# Patient Record
Sex: Female | Born: 1940 | Race: White | Hispanic: No | Smoking: Former smoker
Health system: Southern US, Community
[De-identification: ages and names within clinical notes are randomized; demographics above are authoritative.]

## PROBLEM LIST (undated history)

## (undated) DIAGNOSIS — H5789 Other specified disorders of eye and adnexa: Secondary | ICD-10-CM

## (undated) DIAGNOSIS — I5022 Chronic systolic (congestive) heart failure: Secondary | ICD-10-CM

## (undated) DIAGNOSIS — Z952 Presence of prosthetic heart valve: Secondary | ICD-10-CM

## (undated) DIAGNOSIS — C439 Malignant melanoma of skin, unspecified: Secondary | ICD-10-CM

## (undated) DIAGNOSIS — I4821 Permanent atrial fibrillation: Secondary | ICD-10-CM

## (undated) DIAGNOSIS — I809 Phlebitis and thrombophlebitis of unspecified site: Secondary | ICD-10-CM

## (undated) DIAGNOSIS — H409 Unspecified glaucoma: Secondary | ICD-10-CM

## (undated) DIAGNOSIS — I10 Essential (primary) hypertension: Secondary | ICD-10-CM

## (undated) DIAGNOSIS — I428 Other cardiomyopathies: Secondary | ICD-10-CM

## (undated) DIAGNOSIS — A048 Other specified bacterial intestinal infections: Secondary | ICD-10-CM

## (undated) DIAGNOSIS — E785 Hyperlipidemia, unspecified: Secondary | ICD-10-CM

## (undated) DIAGNOSIS — N183 Chronic kidney disease, stage 3 unspecified: Secondary | ICD-10-CM

## (undated) DIAGNOSIS — I2510 Atherosclerotic heart disease of native coronary artery without angina pectoris: Secondary | ICD-10-CM

## (undated) DIAGNOSIS — I639 Cerebral infarction, unspecified: Secondary | ICD-10-CM

## (undated) DIAGNOSIS — I742 Embolism and thrombosis of arteries of the upper extremities: Secondary | ICD-10-CM

## (undated) DIAGNOSIS — I071 Rheumatic tricuspid insufficiency: Secondary | ICD-10-CM

## (undated) DIAGNOSIS — Z8679 Personal history of other diseases of the circulatory system: Secondary | ICD-10-CM

## (undated) HISTORY — DX: Atherosclerotic heart disease of native coronary artery without angina pectoris: I25.10

## (undated) HISTORY — PX: CATARACT EXTRACTION: SUR2

## (undated) HISTORY — DX: Embolism and thrombosis of arteries of the upper extremities: I74.2

## (undated) HISTORY — PX: OTHER SURGICAL HISTORY: SHX169

## (undated) HISTORY — DX: Presence of prosthetic heart valve: Z95.2

## (undated) HISTORY — DX: Permanent atrial fibrillation: I48.21

## (undated) HISTORY — DX: Hyperlipidemia, unspecified: E78.5

## (undated) HISTORY — DX: Rheumatic tricuspid insufficiency: I07.1

## (undated) HISTORY — DX: Chronic kidney disease, stage 3 unspecified: N18.30

## (undated) HISTORY — DX: Other specified disorders of eye and adnexa: H57.89

## (undated) HISTORY — DX: Essential (primary) hypertension: I10

## (undated) HISTORY — PX: ABDOMINAL HYSTERECTOMY: SHX81

## (undated) HISTORY — DX: Other cardiomyopathies: I42.8

## (undated) HISTORY — DX: Unspecified glaucoma: H40.9

## (undated) HISTORY — PX: CHOLECYSTECTOMY: SHX55

## (undated) HISTORY — DX: Other specified bacterial intestinal infections: A04.8

## (undated) HISTORY — DX: Personal history of other diseases of the circulatory system: Z86.79

## (undated) HISTORY — DX: Chronic kidney disease, stage 3 (moderate): N18.3

## (undated) HISTORY — DX: Chronic systolic (congestive) heart failure: I50.22

## (undated) HISTORY — DX: Malignant melanoma of skin, unspecified: C43.9

## (undated) HISTORY — DX: Cerebral infarction, unspecified: I63.9

## (undated) HISTORY — PX: BREAST BIOPSY: SHX20

## (undated) HISTORY — DX: Phlebitis and thrombophlebitis of unspecified site: I80.9

---

## 1989-06-23 HISTORY — PX: TOTAL ABDOMINAL HYSTERECTOMY W/ BILATERAL SALPINGOOPHORECTOMY: SHX83

## 2000-03-09 DIAGNOSIS — I2510 Atherosclerotic heart disease of native coronary artery without angina pectoris: Secondary | ICD-10-CM

## 2000-03-09 HISTORY — DX: Atherosclerotic heart disease of native coronary artery without angina pectoris: I25.10

## 2004-06-23 DIAGNOSIS — C439 Malignant melanoma of skin, unspecified: Secondary | ICD-10-CM

## 2004-06-23 HISTORY — DX: Malignant melanoma of skin, unspecified: C43.9

## 2004-06-23 HISTORY — PX: GALLBLADDER SURGERY: SHX652

## 2011-06-24 HISTORY — PX: ABLATION: SHX5711

## 2012-05-23 DIAGNOSIS — Z952 Presence of prosthetic heart valve: Secondary | ICD-10-CM

## 2012-05-23 HISTORY — PX: TRICUSPID VALVE REPLACEMENT: SHX816

## 2012-05-23 HISTORY — DX: Presence of prosthetic heart valve: Z95.2

## 2012-05-23 HISTORY — PX: MITRAL VALVE REPLACEMENT: SHX147

## 2012-06-23 DIAGNOSIS — I742 Embolism and thrombosis of arteries of the upper extremities: Secondary | ICD-10-CM | POA: Insufficient documentation

## 2012-06-23 DIAGNOSIS — I639 Cerebral infarction, unspecified: Secondary | ICD-10-CM

## 2012-06-23 HISTORY — DX: Embolism and thrombosis of arteries of the upper extremities: I74.2

## 2012-06-23 HISTORY — DX: Cerebral infarction, unspecified: I63.9

## 2012-06-23 HISTORY — PX: ATHERECTOMY: SHX47

## 2013-02-21 HISTORY — PX: DG ABDOMEN COMPLETE (ARMC HX): HXRAD1017

## 2013-07-01 DIAGNOSIS — I509 Heart failure, unspecified: Secondary | ICD-10-CM | POA: Diagnosis not present

## 2013-07-01 DIAGNOSIS — I4891 Unspecified atrial fibrillation: Secondary | ICD-10-CM | POA: Diagnosis not present

## 2013-07-01 DIAGNOSIS — I059 Rheumatic mitral valve disease, unspecified: Secondary | ICD-10-CM | POA: Diagnosis not present

## 2013-07-04 DIAGNOSIS — Z961 Presence of intraocular lens: Secondary | ICD-10-CM | POA: Diagnosis not present

## 2013-07-04 DIAGNOSIS — H401290 Low-tension glaucoma, unspecified eye, stage unspecified: Secondary | ICD-10-CM | POA: Diagnosis not present

## 2013-07-07 DIAGNOSIS — I509 Heart failure, unspecified: Secondary | ICD-10-CM | POA: Diagnosis not present

## 2013-07-13 DIAGNOSIS — I4891 Unspecified atrial fibrillation: Secondary | ICD-10-CM | POA: Diagnosis not present

## 2013-07-13 DIAGNOSIS — I059 Rheumatic mitral valve disease, unspecified: Secondary | ICD-10-CM | POA: Diagnosis not present

## 2013-07-13 DIAGNOSIS — I509 Heart failure, unspecified: Secondary | ICD-10-CM | POA: Diagnosis not present

## 2013-07-15 DIAGNOSIS — L03039 Cellulitis of unspecified toe: Secondary | ICD-10-CM | POA: Diagnosis not present

## 2013-07-18 DIAGNOSIS — I4891 Unspecified atrial fibrillation: Secondary | ICD-10-CM | POA: Diagnosis not present

## 2013-07-18 DIAGNOSIS — Z7901 Long term (current) use of anticoagulants: Secondary | ICD-10-CM | POA: Diagnosis not present

## 2013-08-02 DIAGNOSIS — I509 Heart failure, unspecified: Secondary | ICD-10-CM | POA: Diagnosis not present

## 2013-08-05 DIAGNOSIS — I509 Heart failure, unspecified: Secondary | ICD-10-CM | POA: Diagnosis not present

## 2013-08-05 DIAGNOSIS — I059 Rheumatic mitral valve disease, unspecified: Secondary | ICD-10-CM | POA: Diagnosis not present

## 2013-08-05 DIAGNOSIS — I4891 Unspecified atrial fibrillation: Secondary | ICD-10-CM | POA: Diagnosis not present

## 2013-08-11 DIAGNOSIS — Z1212 Encounter for screening for malignant neoplasm of rectum: Secondary | ICD-10-CM | POA: Diagnosis not present

## 2013-08-11 DIAGNOSIS — Z01419 Encounter for gynecological examination (general) (routine) without abnormal findings: Secondary | ICD-10-CM | POA: Diagnosis not present

## 2013-08-11 DIAGNOSIS — Z1389 Encounter for screening for other disorder: Secondary | ICD-10-CM | POA: Diagnosis not present

## 2013-08-11 DIAGNOSIS — Z1272 Encounter for screening for malignant neoplasm of vagina: Secondary | ICD-10-CM | POA: Diagnosis not present

## 2013-08-21 HISTORY — PX: INSERT / REPLACE / REMOVE PACEMAKER: SUR710

## 2013-08-23 DIAGNOSIS — I442 Atrioventricular block, complete: Secondary | ICD-10-CM | POA: Diagnosis not present

## 2013-08-23 DIAGNOSIS — I4891 Unspecified atrial fibrillation: Secondary | ICD-10-CM | POA: Diagnosis not present

## 2013-08-23 DIAGNOSIS — I509 Heart failure, unspecified: Secondary | ICD-10-CM | POA: Diagnosis not present

## 2013-08-23 DIAGNOSIS — R609 Edema, unspecified: Secondary | ICD-10-CM | POA: Diagnosis not present

## 2013-08-23 DIAGNOSIS — I129 Hypertensive chronic kidney disease with stage 1 through stage 4 chronic kidney disease, or unspecified chronic kidney disease: Secondary | ICD-10-CM | POA: Diagnosis not present

## 2013-08-23 DIAGNOSIS — N183 Chronic kidney disease, stage 3 unspecified: Secondary | ICD-10-CM | POA: Diagnosis not present

## 2013-08-23 DIAGNOSIS — D631 Anemia in chronic kidney disease: Secondary | ICD-10-CM | POA: Diagnosis not present

## 2013-08-23 LAB — VITAMIN B12: VITAMIN B12: 491

## 2013-08-23 LAB — IRON AND TIBC: TIBC: 404

## 2013-08-23 LAB — PTH, INTACT: PTH Interp: 54.6

## 2013-08-23 LAB — FOLATE: FOLATE: 12.5

## 2013-08-23 LAB — FERRITIN: FERRITIN: 120

## 2013-08-29 DIAGNOSIS — Z7901 Long term (current) use of anticoagulants: Secondary | ICD-10-CM | POA: Diagnosis not present

## 2013-08-29 DIAGNOSIS — I4891 Unspecified atrial fibrillation: Secondary | ICD-10-CM | POA: Diagnosis not present

## 2013-08-30 DIAGNOSIS — I871 Compression of vein: Secondary | ICD-10-CM | POA: Diagnosis not present

## 2013-09-05 DIAGNOSIS — I447 Left bundle-branch block, unspecified: Secondary | ICD-10-CM | POA: Diagnosis not present

## 2013-09-05 DIAGNOSIS — Z86718 Personal history of other venous thrombosis and embolism: Secondary | ICD-10-CM | POA: Diagnosis not present

## 2013-09-05 DIAGNOSIS — Z7901 Long term (current) use of anticoagulants: Secondary | ICD-10-CM | POA: Diagnosis not present

## 2013-09-05 DIAGNOSIS — I509 Heart failure, unspecified: Secondary | ICD-10-CM | POA: Diagnosis not present

## 2013-09-05 DIAGNOSIS — Z954 Presence of other heart-valve replacement: Secondary | ICD-10-CM | POA: Diagnosis not present

## 2013-09-05 DIAGNOSIS — I4891 Unspecified atrial fibrillation: Secondary | ICD-10-CM | POA: Diagnosis not present

## 2013-09-05 DIAGNOSIS — I5022 Chronic systolic (congestive) heart failure: Secondary | ICD-10-CM | POA: Diagnosis not present

## 2013-09-05 DIAGNOSIS — Z4502 Encounter for adjustment and management of automatic implantable cardiac defibrillator: Secondary | ICD-10-CM | POA: Diagnosis not present

## 2013-09-05 DIAGNOSIS — I2510 Atherosclerotic heart disease of native coronary artery without angina pectoris: Secondary | ICD-10-CM | POA: Diagnosis not present

## 2013-09-05 DIAGNOSIS — Z79899 Other long term (current) drug therapy: Secondary | ICD-10-CM | POA: Diagnosis not present

## 2013-09-05 DIAGNOSIS — Z006 Encounter for examination for normal comparison and control in clinical research program: Secondary | ICD-10-CM | POA: Diagnosis not present

## 2013-09-05 DIAGNOSIS — Z01812 Encounter for preprocedural laboratory examination: Secondary | ICD-10-CM | POA: Diagnosis not present

## 2013-09-06 DIAGNOSIS — I4891 Unspecified atrial fibrillation: Secondary | ICD-10-CM | POA: Diagnosis not present

## 2013-09-06 DIAGNOSIS — I447 Left bundle-branch block, unspecified: Secondary | ICD-10-CM | POA: Diagnosis not present

## 2013-09-06 DIAGNOSIS — I2510 Atherosclerotic heart disease of native coronary artery without angina pectoris: Secondary | ICD-10-CM | POA: Diagnosis not present

## 2013-09-06 DIAGNOSIS — I5022 Chronic systolic (congestive) heart failure: Secondary | ICD-10-CM | POA: Diagnosis not present

## 2013-09-06 DIAGNOSIS — I509 Heart failure, unspecified: Secondary | ICD-10-CM | POA: Diagnosis not present

## 2013-09-06 DIAGNOSIS — Z006 Encounter for examination for normal comparison and control in clinical research program: Secondary | ICD-10-CM | POA: Diagnosis not present

## 2013-09-07 DIAGNOSIS — R071 Chest pain on breathing: Secondary | ICD-10-CM | POA: Diagnosis not present

## 2013-09-07 DIAGNOSIS — R229 Localized swelling, mass and lump, unspecified: Secondary | ICD-10-CM | POA: Diagnosis not present

## 2013-09-07 DIAGNOSIS — Z7901 Long term (current) use of anticoagulants: Secondary | ICD-10-CM | POA: Diagnosis not present

## 2013-09-07 DIAGNOSIS — Z95810 Presence of automatic (implantable) cardiac defibrillator: Secondary | ICD-10-CM | POA: Diagnosis not present

## 2013-09-07 DIAGNOSIS — T82897A Other specified complication of cardiac prosthetic devices, implants and grafts, initial encounter: Secondary | ICD-10-CM | POA: Diagnosis not present

## 2013-09-07 DIAGNOSIS — R079 Chest pain, unspecified: Secondary | ICD-10-CM | POA: Diagnosis not present

## 2013-09-15 DIAGNOSIS — E785 Hyperlipidemia, unspecified: Secondary | ICD-10-CM | POA: Diagnosis not present

## 2013-09-15 DIAGNOSIS — I10 Essential (primary) hypertension: Secondary | ICD-10-CM | POA: Diagnosis not present

## 2013-10-04 DIAGNOSIS — R21 Rash and other nonspecific skin eruption: Secondary | ICD-10-CM | POA: Diagnosis not present

## 2013-10-10 DIAGNOSIS — I4891 Unspecified atrial fibrillation: Secondary | ICD-10-CM | POA: Diagnosis not present

## 2013-10-10 DIAGNOSIS — Z7901 Long term (current) use of anticoagulants: Secondary | ICD-10-CM | POA: Diagnosis not present

## 2013-10-13 DIAGNOSIS — J209 Acute bronchitis, unspecified: Secondary | ICD-10-CM | POA: Diagnosis not present

## 2013-10-31 DIAGNOSIS — H401290 Low-tension glaucoma, unspecified eye, stage unspecified: Secondary | ICD-10-CM | POA: Diagnosis not present

## 2013-10-31 DIAGNOSIS — Z961 Presence of intraocular lens: Secondary | ICD-10-CM | POA: Diagnosis not present

## 2013-11-10 DIAGNOSIS — Z7901 Long term (current) use of anticoagulants: Secondary | ICD-10-CM | POA: Diagnosis not present

## 2013-11-10 DIAGNOSIS — I4891 Unspecified atrial fibrillation: Secondary | ICD-10-CM | POA: Diagnosis not present

## 2013-11-25 DIAGNOSIS — I4891 Unspecified atrial fibrillation: Secondary | ICD-10-CM | POA: Diagnosis not present

## 2013-11-25 DIAGNOSIS — Z7901 Long term (current) use of anticoagulants: Secondary | ICD-10-CM | POA: Diagnosis not present

## 2013-12-01 DIAGNOSIS — Z7901 Long term (current) use of anticoagulants: Secondary | ICD-10-CM | POA: Diagnosis not present

## 2013-12-01 DIAGNOSIS — I4891 Unspecified atrial fibrillation: Secondary | ICD-10-CM | POA: Diagnosis not present

## 2013-12-13 DIAGNOSIS — Z95810 Presence of automatic (implantable) cardiac defibrillator: Secondary | ICD-10-CM | POA: Diagnosis not present

## 2013-12-13 DIAGNOSIS — I429 Cardiomyopathy, unspecified: Secondary | ICD-10-CM | POA: Diagnosis not present

## 2013-12-13 DIAGNOSIS — I442 Atrioventricular block, complete: Secondary | ICD-10-CM | POA: Diagnosis not present

## 2013-12-13 DIAGNOSIS — I4891 Unspecified atrial fibrillation: Secondary | ICD-10-CM | POA: Diagnosis not present

## 2013-12-19 DIAGNOSIS — S61209A Unspecified open wound of unspecified finger without damage to nail, initial encounter: Secondary | ICD-10-CM | POA: Diagnosis not present

## 2013-12-29 DIAGNOSIS — I4891 Unspecified atrial fibrillation: Secondary | ICD-10-CM | POA: Diagnosis not present

## 2013-12-29 DIAGNOSIS — Z7901 Long term (current) use of anticoagulants: Secondary | ICD-10-CM | POA: Diagnosis not present

## 2014-01-02 DIAGNOSIS — H401290 Low-tension glaucoma, unspecified eye, stage unspecified: Secondary | ICD-10-CM | POA: Diagnosis not present

## 2014-01-02 DIAGNOSIS — Z961 Presence of intraocular lens: Secondary | ICD-10-CM | POA: Diagnosis not present

## 2014-01-02 DIAGNOSIS — H3560 Retinal hemorrhage, unspecified eye: Secondary | ICD-10-CM | POA: Diagnosis not present

## 2014-01-05 DIAGNOSIS — I658 Occlusion and stenosis of other precerebral arteries: Secondary | ICD-10-CM | POA: Diagnosis not present

## 2014-01-05 DIAGNOSIS — I6529 Occlusion and stenosis of unspecified carotid artery: Secondary | ICD-10-CM | POA: Diagnosis not present

## 2014-01-05 DIAGNOSIS — H3560 Retinal hemorrhage, unspecified eye: Secondary | ICD-10-CM | POA: Diagnosis not present

## 2014-01-05 DIAGNOSIS — I499 Cardiac arrhythmia, unspecified: Secondary | ICD-10-CM | POA: Diagnosis not present

## 2014-01-13 DIAGNOSIS — I10 Essential (primary) hypertension: Secondary | ICD-10-CM | POA: Diagnosis not present

## 2014-01-13 DIAGNOSIS — Z0000 Encounter for general adult medical examination without abnormal findings: Secondary | ICD-10-CM | POA: Diagnosis not present

## 2014-01-13 DIAGNOSIS — H3560 Retinal hemorrhage, unspecified eye: Secondary | ICD-10-CM | POA: Diagnosis not present

## 2014-01-13 DIAGNOSIS — E785 Hyperlipidemia, unspecified: Secondary | ICD-10-CM | POA: Diagnosis not present

## 2014-01-13 DIAGNOSIS — Z23 Encounter for immunization: Secondary | ICD-10-CM | POA: Diagnosis not present

## 2014-01-13 LAB — HEMOGLOBIN A1C: A1c: 6

## 2014-01-17 DIAGNOSIS — M545 Low back pain, unspecified: Secondary | ICD-10-CM | POA: Diagnosis not present

## 2014-01-20 DIAGNOSIS — I4891 Unspecified atrial fibrillation: Secondary | ICD-10-CM | POA: Diagnosis not present

## 2014-01-20 DIAGNOSIS — Z7901 Long term (current) use of anticoagulants: Secondary | ICD-10-CM | POA: Diagnosis not present

## 2014-01-30 DIAGNOSIS — H3560 Retinal hemorrhage, unspecified eye: Secondary | ICD-10-CM | POA: Diagnosis not present

## 2014-01-30 DIAGNOSIS — H401290 Low-tension glaucoma, unspecified eye, stage unspecified: Secondary | ICD-10-CM | POA: Diagnosis not present

## 2014-01-30 DIAGNOSIS — H409 Unspecified glaucoma: Secondary | ICD-10-CM | POA: Diagnosis not present

## 2014-01-30 DIAGNOSIS — Z961 Presence of intraocular lens: Secondary | ICD-10-CM | POA: Diagnosis not present

## 2014-02-02 DIAGNOSIS — M545 Low back pain, unspecified: Secondary | ICD-10-CM | POA: Diagnosis not present

## 2014-02-13 DIAGNOSIS — L821 Other seborrheic keratosis: Secondary | ICD-10-CM | POA: Diagnosis not present

## 2014-02-13 DIAGNOSIS — Z85820 Personal history of malignant melanoma of skin: Secondary | ICD-10-CM | POA: Diagnosis not present

## 2014-02-13 DIAGNOSIS — L820 Inflamed seborrheic keratosis: Secondary | ICD-10-CM | POA: Diagnosis not present

## 2014-02-13 DIAGNOSIS — Z85828 Personal history of other malignant neoplasm of skin: Secondary | ICD-10-CM | POA: Diagnosis not present

## 2014-02-13 DIAGNOSIS — L570 Actinic keratosis: Secondary | ICD-10-CM | POA: Diagnosis not present

## 2014-02-17 DIAGNOSIS — I4891 Unspecified atrial fibrillation: Secondary | ICD-10-CM | POA: Diagnosis not present

## 2014-02-17 DIAGNOSIS — Z7901 Long term (current) use of anticoagulants: Secondary | ICD-10-CM | POA: Diagnosis not present

## 2014-02-17 DIAGNOSIS — M5137 Other intervertebral disc degeneration, lumbosacral region: Secondary | ICD-10-CM | POA: Diagnosis not present

## 2014-02-21 DIAGNOSIS — N183 Chronic kidney disease, stage 3 unspecified: Secondary | ICD-10-CM | POA: Diagnosis not present

## 2014-02-21 DIAGNOSIS — I509 Heart failure, unspecified: Secondary | ICD-10-CM | POA: Diagnosis not present

## 2014-02-21 DIAGNOSIS — R609 Edema, unspecified: Secondary | ICD-10-CM | POA: Diagnosis not present

## 2014-02-21 DIAGNOSIS — L03039 Cellulitis of unspecified toe: Secondary | ICD-10-CM | POA: Diagnosis not present

## 2014-02-21 HISTORY — PX: US ECHOCARDIOGRAPHY: HXRAD669

## 2014-02-23 DIAGNOSIS — M545 Low back pain, unspecified: Secondary | ICD-10-CM | POA: Diagnosis not present

## 2014-02-23 DIAGNOSIS — IMO0002 Reserved for concepts with insufficient information to code with codable children: Secondary | ICD-10-CM | POA: Diagnosis not present

## 2014-03-02 DIAGNOSIS — I079 Rheumatic tricuspid valve disease, unspecified: Secondary | ICD-10-CM | POA: Diagnosis not present

## 2014-03-02 DIAGNOSIS — I429 Cardiomyopathy, unspecified: Secondary | ICD-10-CM | POA: Diagnosis not present

## 2014-03-02 DIAGNOSIS — I517 Cardiomegaly: Secondary | ICD-10-CM | POA: Diagnosis not present

## 2014-03-02 DIAGNOSIS — Z954 Presence of other heart-valve replacement: Secondary | ICD-10-CM | POA: Diagnosis not present

## 2014-03-02 DIAGNOSIS — M545 Low back pain, unspecified: Secondary | ICD-10-CM | POA: Diagnosis not present

## 2014-03-02 DIAGNOSIS — IMO0002 Reserved for concepts with insufficient information to code with codable children: Secondary | ICD-10-CM | POA: Diagnosis not present

## 2014-03-02 DIAGNOSIS — R6889 Other general symptoms and signs: Secondary | ICD-10-CM | POA: Diagnosis not present

## 2014-03-02 DIAGNOSIS — I059 Rheumatic mitral valve disease, unspecified: Secondary | ICD-10-CM | POA: Diagnosis not present

## 2014-03-02 DIAGNOSIS — I428 Other cardiomyopathies: Secondary | ICD-10-CM | POA: Diagnosis not present

## 2014-03-03 DIAGNOSIS — IMO0002 Reserved for concepts with insufficient information to code with codable children: Secondary | ICD-10-CM | POA: Diagnosis not present

## 2014-03-03 DIAGNOSIS — M545 Low back pain, unspecified: Secondary | ICD-10-CM | POA: Diagnosis not present

## 2014-03-15 DIAGNOSIS — I4891 Unspecified atrial fibrillation: Secondary | ICD-10-CM | POA: Diagnosis not present

## 2014-03-15 DIAGNOSIS — Z7901 Long term (current) use of anticoagulants: Secondary | ICD-10-CM | POA: Diagnosis not present

## 2014-03-23 HISTORY — PX: COLONOSCOPY: SHX174

## 2014-03-24 DIAGNOSIS — I429 Cardiomyopathy, unspecified: Secondary | ICD-10-CM | POA: Diagnosis not present

## 2014-03-24 DIAGNOSIS — Z95810 Presence of automatic (implantable) cardiac defibrillator: Secondary | ICD-10-CM | POA: Diagnosis not present

## 2014-03-24 DIAGNOSIS — I482 Chronic atrial fibrillation: Secondary | ICD-10-CM | POA: Diagnosis not present

## 2014-03-24 DIAGNOSIS — I442 Atrioventricular block, complete: Secondary | ICD-10-CM | POA: Diagnosis not present

## 2014-04-12 DIAGNOSIS — Z9089 Acquired absence of other organs: Secondary | ICD-10-CM | POA: Diagnosis not present

## 2014-04-12 DIAGNOSIS — Z800 Family history of malignant neoplasm of digestive organs: Secondary | ICD-10-CM | POA: Diagnosis not present

## 2014-04-12 DIAGNOSIS — K219 Gastro-esophageal reflux disease without esophagitis: Secondary | ICD-10-CM | POA: Diagnosis not present

## 2014-04-17 DIAGNOSIS — Z7901 Long term (current) use of anticoagulants: Secondary | ICD-10-CM | POA: Diagnosis not present

## 2014-04-17 DIAGNOSIS — I480 Paroxysmal atrial fibrillation: Secondary | ICD-10-CM | POA: Diagnosis not present

## 2014-04-21 DIAGNOSIS — Z800 Family history of malignant neoplasm of digestive organs: Secondary | ICD-10-CM | POA: Diagnosis not present

## 2014-04-21 DIAGNOSIS — K648 Other hemorrhoids: Secondary | ICD-10-CM | POA: Diagnosis not present

## 2014-04-21 DIAGNOSIS — K641 Second degree hemorrhoids: Secondary | ICD-10-CM | POA: Diagnosis not present

## 2014-04-21 DIAGNOSIS — Z8371 Family history of colonic polyps: Secondary | ICD-10-CM | POA: Diagnosis not present

## 2014-04-21 DIAGNOSIS — Z1211 Encounter for screening for malignant neoplasm of colon: Secondary | ICD-10-CM | POA: Diagnosis not present

## 2014-04-21 DIAGNOSIS — K5730 Diverticulosis of large intestine without perforation or abscess without bleeding: Secondary | ICD-10-CM | POA: Diagnosis not present

## 2014-04-21 DIAGNOSIS — Z86010 Personal history of colonic polyps: Secondary | ICD-10-CM | POA: Diagnosis not present

## 2014-04-21 LAB — HM COLONOSCOPY

## 2014-04-23 LAB — HM MAMMOGRAPHY

## 2014-04-27 DIAGNOSIS — H53469 Homonymous bilateral field defects, unspecified side: Secondary | ICD-10-CM | POA: Diagnosis not present

## 2014-04-27 DIAGNOSIS — H401213 Low-tension glaucoma, right eye, severe stage: Secondary | ICD-10-CM | POA: Diagnosis not present

## 2014-04-27 DIAGNOSIS — H401223 Low-tension glaucoma, left eye, severe stage: Secondary | ICD-10-CM | POA: Diagnosis not present

## 2014-05-02 DIAGNOSIS — R922 Inconclusive mammogram: Secondary | ICD-10-CM | POA: Diagnosis not present

## 2014-05-02 DIAGNOSIS — Z1231 Encounter for screening mammogram for malignant neoplasm of breast: Secondary | ICD-10-CM | POA: Diagnosis not present

## 2014-05-02 DIAGNOSIS — Z1239 Encounter for other screening for malignant neoplasm of breast: Secondary | ICD-10-CM | POA: Diagnosis not present

## 2014-05-09 DIAGNOSIS — I429 Cardiomyopathy, unspecified: Secondary | ICD-10-CM | POA: Diagnosis not present

## 2014-05-09 DIAGNOSIS — Z95810 Presence of automatic (implantable) cardiac defibrillator: Secondary | ICD-10-CM | POA: Diagnosis not present

## 2014-05-09 DIAGNOSIS — I442 Atrioventricular block, complete: Secondary | ICD-10-CM | POA: Diagnosis not present

## 2014-05-09 DIAGNOSIS — I482 Chronic atrial fibrillation: Secondary | ICD-10-CM | POA: Diagnosis not present

## 2014-05-12 DIAGNOSIS — M25512 Pain in left shoulder: Secondary | ICD-10-CM | POA: Diagnosis not present

## 2014-05-17 DIAGNOSIS — Z7901 Long term (current) use of anticoagulants: Secondary | ICD-10-CM | POA: Diagnosis not present

## 2014-05-17 DIAGNOSIS — I4891 Unspecified atrial fibrillation: Secondary | ICD-10-CM | POA: Diagnosis not present

## 2014-06-01 DIAGNOSIS — M7542 Impingement syndrome of left shoulder: Secondary | ICD-10-CM | POA: Diagnosis not present

## 2014-06-02 DIAGNOSIS — Z7901 Long term (current) use of anticoagulants: Secondary | ICD-10-CM | POA: Diagnosis not present

## 2014-06-02 DIAGNOSIS — I4891 Unspecified atrial fibrillation: Secondary | ICD-10-CM | POA: Diagnosis not present

## 2014-06-12 DIAGNOSIS — E785 Hyperlipidemia, unspecified: Secondary | ICD-10-CM | POA: Diagnosis not present

## 2014-06-12 DIAGNOSIS — I4891 Unspecified atrial fibrillation: Secondary | ICD-10-CM | POA: Diagnosis not present

## 2014-06-12 DIAGNOSIS — I10 Essential (primary) hypertension: Secondary | ICD-10-CM | POA: Diagnosis not present

## 2014-06-12 DIAGNOSIS — K635 Polyp of colon: Secondary | ICD-10-CM | POA: Diagnosis not present

## 2014-06-12 DIAGNOSIS — I2510 Atherosclerotic heart disease of native coronary artery without angina pectoris: Secondary | ICD-10-CM | POA: Diagnosis not present

## 2014-06-12 DIAGNOSIS — I5022 Chronic systolic (congestive) heart failure: Secondary | ICD-10-CM | POA: Diagnosis not present

## 2014-06-12 LAB — CBC
HGB: 13.9 g/dL
PLATELETS: 177
WBC: 8.5

## 2014-06-12 LAB — LIPID PANEL
Cholesterol: 192
HDL: 74 mg/dL — AB (ref 35–70)
LDL (calc): 104
Triglycerides: 70

## 2014-06-12 LAB — COMPREHENSIVE METABOLIC PANEL
ALT: 13
AST: 18 U/L
Alkaline Phosphatase: 45 U/L
BILIRUBIN TOTAL: 0.6 mg/dL
CREATININE: 1.11
Glucose: 90
Potassium: 4.2 mmol/L
Sodium: 140

## 2014-06-12 LAB — TSH: TSH: 1.48 u[IU]/mL (ref 0.41–5.90)

## 2014-06-15 DIAGNOSIS — J029 Acute pharyngitis, unspecified: Secondary | ICD-10-CM | POA: Diagnosis not present

## 2014-06-30 DIAGNOSIS — Z7901 Long term (current) use of anticoagulants: Secondary | ICD-10-CM | POA: Diagnosis not present

## 2014-06-30 DIAGNOSIS — I4891 Unspecified atrial fibrillation: Secondary | ICD-10-CM | POA: Diagnosis not present

## 2014-07-04 DIAGNOSIS — M7542 Impingement syndrome of left shoulder: Secondary | ICD-10-CM | POA: Diagnosis not present

## 2014-07-14 DIAGNOSIS — M7542 Impingement syndrome of left shoulder: Secondary | ICD-10-CM | POA: Diagnosis not present

## 2014-07-28 DIAGNOSIS — M25512 Pain in left shoulder: Secondary | ICD-10-CM | POA: Diagnosis not present

## 2014-08-04 DIAGNOSIS — H401223 Low-tension glaucoma, left eye, severe stage: Secondary | ICD-10-CM | POA: Diagnosis not present

## 2014-08-04 DIAGNOSIS — H53469 Homonymous bilateral field defects, unspecified side: Secondary | ICD-10-CM | POA: Diagnosis not present

## 2014-08-04 DIAGNOSIS — H401213 Low-tension glaucoma, right eye, severe stage: Secondary | ICD-10-CM | POA: Diagnosis not present

## 2014-08-08 DIAGNOSIS — I10 Essential (primary) hypertension: Secondary | ICD-10-CM | POA: Diagnosis not present

## 2014-08-10 DIAGNOSIS — N183 Chronic kidney disease, stage 3 (moderate): Secondary | ICD-10-CM | POA: Diagnosis not present

## 2014-08-10 DIAGNOSIS — I509 Heart failure, unspecified: Secondary | ICD-10-CM | POA: Diagnosis not present

## 2014-08-10 LAB — MAGNESIUM: MAGNESIUM: 2.3

## 2014-08-10 LAB — PTH, INTACT: PTH INTERP: 59.7

## 2014-08-11 DIAGNOSIS — Z7901 Long term (current) use of anticoagulants: Secondary | ICD-10-CM | POA: Diagnosis not present

## 2014-08-11 DIAGNOSIS — I4891 Unspecified atrial fibrillation: Secondary | ICD-10-CM | POA: Diagnosis not present

## 2014-08-11 DIAGNOSIS — M25512 Pain in left shoulder: Secondary | ICD-10-CM | POA: Diagnosis not present

## 2014-08-15 DIAGNOSIS — N952 Postmenopausal atrophic vaginitis: Secondary | ICD-10-CM | POA: Diagnosis not present

## 2014-08-15 DIAGNOSIS — Z1212 Encounter for screening for malignant neoplasm of rectum: Secondary | ICD-10-CM | POA: Diagnosis not present

## 2014-08-15 DIAGNOSIS — Z1389 Encounter for screening for other disorder: Secondary | ICD-10-CM | POA: Diagnosis not present

## 2014-08-18 DIAGNOSIS — I429 Cardiomyopathy, unspecified: Secondary | ICD-10-CM | POA: Diagnosis not present

## 2014-08-18 DIAGNOSIS — Z95810 Presence of automatic (implantable) cardiac defibrillator: Secondary | ICD-10-CM | POA: Diagnosis not present

## 2014-09-13 ENCOUNTER — Emergency Department: Payer: Self-pay | Admitting: Emergency Medicine

## 2014-09-13 DIAGNOSIS — M546 Pain in thoracic spine: Secondary | ICD-10-CM | POA: Diagnosis not present

## 2014-09-13 DIAGNOSIS — M62830 Muscle spasm of back: Secondary | ICD-10-CM | POA: Diagnosis not present

## 2014-09-13 DIAGNOSIS — Z87891 Personal history of nicotine dependence: Secondary | ICD-10-CM | POA: Diagnosis not present

## 2014-09-13 DIAGNOSIS — M549 Dorsalgia, unspecified: Secondary | ICD-10-CM | POA: Diagnosis not present

## 2014-09-13 LAB — URINALYSIS, COMPLETE
BLOOD: NEGATIVE
Bilirubin,UR: NEGATIVE
GLUCOSE, UR: NEGATIVE mg/dL (ref 0–75)
Ketone: NEGATIVE
LEUKOCYTE ESTERASE: NEGATIVE
Nitrite: NEGATIVE
PH: 6 (ref 4.5–8.0)
PROTEIN: NEGATIVE
RBC,UR: 1 /HPF (ref 0–5)
SPECIFIC GRAVITY: 1.014 (ref 1.003–1.030)
Squamous Epithelial: 4
WBC UR: 1 /HPF (ref 0–5)

## 2014-09-13 LAB — CBC
HCT: 39.8 % (ref 35.0–47.0)
HGB: 12.9 g/dL (ref 12.0–16.0)
MCH: 29.5 pg (ref 26.0–34.0)
MCHC: 32.3 g/dL (ref 32.0–36.0)
MCV: 91 fL (ref 80–100)
Platelet: 242 10*3/uL (ref 150–440)
RBC: 4.36 10*6/uL (ref 3.80–5.20)
RDW: 14.1 % (ref 11.5–14.5)
WBC: 7.9 10*3/uL (ref 3.6–11.0)

## 2014-09-13 LAB — PROTIME-INR
INR: 2.1
Prothrombin Time: 24.1 secs — ABNORMAL HIGH

## 2014-09-14 ENCOUNTER — Emergency Department: Payer: Self-pay | Admitting: Emergency Medicine

## 2014-09-14 DIAGNOSIS — R109 Unspecified abdominal pain: Secondary | ICD-10-CM | POA: Diagnosis not present

## 2014-09-14 DIAGNOSIS — Z7982 Long term (current) use of aspirin: Secondary | ICD-10-CM | POA: Diagnosis not present

## 2014-09-14 DIAGNOSIS — I10 Essential (primary) hypertension: Secondary | ICD-10-CM | POA: Diagnosis not present

## 2014-09-14 DIAGNOSIS — N200 Calculus of kidney: Secondary | ICD-10-CM | POA: Diagnosis not present

## 2014-09-14 DIAGNOSIS — N3289 Other specified disorders of bladder: Secondary | ICD-10-CM | POA: Diagnosis not present

## 2014-09-14 DIAGNOSIS — Z79899 Other long term (current) drug therapy: Secondary | ICD-10-CM | POA: Diagnosis not present

## 2014-09-14 DIAGNOSIS — R110 Nausea: Secondary | ICD-10-CM | POA: Diagnosis not present

## 2014-09-14 LAB — CBC
HCT: 40.6 % (ref 35.0–47.0)
HGB: 13.3 g/dL (ref 12.0–16.0)
MCH: 29.7 pg (ref 26.0–34.0)
MCHC: 32.7 g/dL (ref 32.0–36.0)
MCV: 91 fL (ref 80–100)
PLATELETS: 255 10*3/uL (ref 150–440)
RBC: 4.47 10*6/uL (ref 3.80–5.20)
RDW: 14.1 % (ref 11.5–14.5)
WBC: 6.2 10*3/uL (ref 3.6–11.0)

## 2014-09-14 LAB — COMPREHENSIVE METABOLIC PANEL
ALK PHOS: 52 U/L
ALT: 15 U/L
Albumin: 4.3 g/dL
Anion Gap: 10 (ref 7–16)
BUN: 29 mg/dL — ABNORMAL HIGH
Bilirubin,Total: 0.7 mg/dL
CALCIUM: 9.2 mg/dL
Chloride: 101 mmol/L
Co2: 25 mmol/L
Creatinine: 1.21 mg/dL — ABNORMAL HIGH
EGFR (African American): 52 — ABNORMAL LOW
GFR CALC NON AF AMER: 45 — AB
GLUCOSE: 182 mg/dL — AB
Potassium: 4.1 mmol/L
SGOT(AST): 23 U/L
Sodium: 136 mmol/L
Total Protein: 7.8 g/dL

## 2014-09-18 ENCOUNTER — Encounter: Payer: Self-pay | Admitting: *Deleted

## 2014-09-18 ENCOUNTER — Encounter (INDEPENDENT_AMBULATORY_CARE_PROVIDER_SITE_OTHER): Payer: Self-pay

## 2014-09-18 ENCOUNTER — Ambulatory Visit (INDEPENDENT_AMBULATORY_CARE_PROVIDER_SITE_OTHER): Payer: Medicare Other | Admitting: Cardiovascular Disease

## 2014-09-18 ENCOUNTER — Encounter: Payer: Self-pay | Admitting: Cardiovascular Disease

## 2014-09-18 VITALS — BP 100/70 | HR 76 | Ht 63.0 in | Wt 144.2 lb

## 2014-09-18 DIAGNOSIS — I5022 Chronic systolic (congestive) heart failure: Secondary | ICD-10-CM | POA: Diagnosis not present

## 2014-09-18 DIAGNOSIS — Z950 Presence of cardiac pacemaker: Secondary | ICD-10-CM

## 2014-09-18 DIAGNOSIS — Z953 Presence of xenogenic heart valve: Secondary | ICD-10-CM | POA: Diagnosis not present

## 2014-09-18 DIAGNOSIS — I4891 Unspecified atrial fibrillation: Secondary | ICD-10-CM | POA: Diagnosis not present

## 2014-09-18 NOTE — Patient Instructions (Signed)
Refer to Coumadin clinic to establish Refer to Dr. Caryl Comes to establish regarding biventricular ICD.  Refer to [facility] are as a new patient.   You can see [facility] if needed.   Continue same medications.   Follow up in 3 months.

## 2014-09-20 ENCOUNTER — Encounter: Payer: Self-pay | Admitting: Cardiovascular Disease

## 2014-09-20 DIAGNOSIS — Z950 Presence of cardiac pacemaker: Secondary | ICD-10-CM | POA: Insufficient documentation

## 2014-09-20 DIAGNOSIS — Z953 Presence of xenogenic heart valve: Secondary | ICD-10-CM | POA: Insufficient documentation

## 2014-09-20 DIAGNOSIS — I5022 Chronic systolic (congestive) heart failure: Secondary | ICD-10-CM | POA: Insufficient documentation

## 2014-09-20 NOTE — Assessment & Plan Note (Signed)
Continue anticoagulation with warfarin. I referred her to our Coumadin clinic.

## 2014-09-20 NOTE — Assessment & Plan Note (Signed)
She appears to be euvolemic and currently is on optimal medical therapy. She is currently [place] Heart Association class II. I might consider switching her to [facility]. However, her blood pressure is too low for that at the present time.

## 2014-09-20 NOTE — Progress Notes (Signed)
HPI  This is a 74 year old female who is here today to establish cardiovascular care. She moved from [place] to be close to her daughter. She has known history of chronic systolic heart failure due to nonischemic cardiomyopathy and possibly valvular heart disease. She used to be followed by cardiology associates of [place]. Congestive heart failure was diagnosed in 2007. Most recent ejection fraction was 40% in September 2015. She has known history of atrial fibrillation. She underwent AV nodal ablation and pacemaker placement in November 2013. 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 Her ejection fraction deteriorated and she underwent explantation of the previous pacemaker and implantation of biventricular ICD in March 2015. She reports possible CVA in the past when her warfarin was held for a procedure. Since then, she has been bridged with low molecular weight heparin. She reports that her recommended INR has been between 2.5-3. She has been doing reasonably well and denies chest pain or worsening dyspnea. She is a previous smoker and quit more than 30 years ago. I spent 30 minutes reviewing her previous records.  Allergies  Allergen Reactions  . Biaxin [Clarithromycin]   . Ciprofloxacin   . Flagyl [Metronidazole]      No current outpatient prescriptions on file prior to visit.   No current facility-administered medications on file prior to visit.     Past Medical History  Diagnosis Date  . Mitral valve regurgitation   . CHF (congestive heart failure)   . Atrial fibrillation   . H. pylori infection   . CKD (chronic kidney disease)   . Hypertension   . Heart murmur   . Skin cancer   . Phlebitis   . Clotting disorder     left arm; occipital lobe  . MI (myocardial infarction) 03/09/2000  . Stroke   . Glaucoma   .  Bleeding of eye   . Hx of rheumatic fever   . Chronic systolic heart failure      Past Surgical History  Procedure Laterality Date  . Defibrillator      MEDTRONIC 5086 MRI 52 CM LEAD, SERIAL # LFP [id number]  . Occipital neurectomy    . Ablation    . Insert / replace / remove pacemaker    . Coronary artery bypass graft    . Mitral valve repair    . Tricuspid valve replacement    . Total abdominal hysterectomy    . Gallbladder surgery    . Cataract extraction Bilateral      Family History  Problem Relation Age of Onset  . Heart disease Father   . Heart attack Father      History   Social History  . Marital Status: Married    Spouse Name: N/A  . Number of Children: N/A  . Years of Education: N/A   Occupational History  . Not on file.   Social History Main Topics  . Smoking status: Former Smoker    Types: Cigarettes  . Smokeless tobacco: Not on file  . Alcohol Use: Yes  . Drug Use: No  . Sexual Activity: Not on file   Other Topics Concern  . Not on file   Social History Narrative     ROS A 10 point review of system was performed. It is negative other than that mentioned in the history of present illness.   PHYSICAL EXAM  BP 100/70 mmHg  Pulse 76  Ht 5\' 3"  (1.6 m)  Wt 144 lb 4 oz (65.431 kg)  BMI 25.56 kg/m2 Constitutional: She is oriented to person, place, and time. She appears well-developed and well-nourished. No distress.  HENT: No nasal discharge.  Head: Normocephalic and atraumatic.  Eyes: Pupils are equal and round. No discharge.  Neck: Normal range of motion. Neck supple. No JVD present. No thyromegaly present.  Cardiovascular: Normal rate, regular rhythm, normal heart sounds. Exam reveals no gallop and no friction rub. No murmur heard.  Pulmonary/Chest: Effort normal and breath sounds normal. No stridor. No respiratory distress. She has no wheezes. She has no rales. She exhibits no tenderness.  Abdominal: Soft. Bowel sounds are normal. She  exhibits no distension. There is no tenderness. There is no rebound and no guarding.  Musculoskeletal: Normal range of motion. She exhibits no edema and no tenderness.  Neurological: She is alert and oriented to person, place, and time. Coordination normal.  Skin: Skin is warm and dry. No rash noted. She is not diaphoretic. No erythema. No pallor.  Psychiatric: She has a normal mood and affect. Her behavior is normal. Judgment and thought content normal.     WLS:[id number] ventricular pacemaker  Pacemaker ECG, No further analysis  INSUFFICIENT DATA   ASSESSMENT AND PLAN

## 2014-09-20 NOTE — Assessment & Plan Note (Signed)
I referred her to our EP clinic to establish.

## 2014-09-20 NOTE — Assessment & Plan Note (Signed)
The valve was intact on most recent echocardiogram from 2015.

## 2014-10-04 ENCOUNTER — Ambulatory Visit (INDEPENDENT_AMBULATORY_CARE_PROVIDER_SITE_OTHER): Payer: Medicare Other

## 2014-10-04 DIAGNOSIS — Z953 Presence of xenogenic heart valve: Secondary | ICD-10-CM | POA: Diagnosis not present

## 2014-10-04 DIAGNOSIS — Z5181 Encounter for therapeutic drug level monitoring: Secondary | ICD-10-CM | POA: Diagnosis not present

## 2014-10-04 DIAGNOSIS — I4891 Unspecified atrial fibrillation: Secondary | ICD-10-CM | POA: Diagnosis not present

## 2014-10-04 LAB — POCT INR: INR: 2.5

## 2014-10-10 ENCOUNTER — Ambulatory Visit
Admit: 2014-10-10 | Disposition: A | Discharge status: Home / Self Care | Payer: Self-pay | Attending: Internal Medicine | Admitting: Internal Medicine

## 2014-10-10 ENCOUNTER — Ambulatory Visit (INDEPENDENT_AMBULATORY_CARE_PROVIDER_SITE_OTHER): Payer: Medicare Other | Admitting: Internal Medicine

## 2014-10-10 ENCOUNTER — Encounter: Payer: Self-pay | Admitting: Internal Medicine

## 2014-10-10 VITALS — BP 120/64 | HR 78 | Ht 63.0 in | Wt 144.5 lb

## 2014-10-10 DIAGNOSIS — Z79899 Other long term (current) drug therapy: Secondary | ICD-10-CM

## 2014-10-10 DIAGNOSIS — Z952 Presence of prosthetic heart valve: Secondary | ICD-10-CM | POA: Diagnosis not present

## 2014-10-10 DIAGNOSIS — I5022 Chronic systolic (congestive) heart failure: Secondary | ICD-10-CM

## 2014-10-10 DIAGNOSIS — I517 Cardiomegaly: Secondary | ICD-10-CM | POA: Diagnosis not present

## 2014-10-10 DIAGNOSIS — Z95810 Presence of automatic (implantable) cardiac defibrillator: Secondary | ICD-10-CM

## 2014-10-10 DIAGNOSIS — I10 Essential (primary) hypertension: Secondary | ICD-10-CM | POA: Diagnosis not present

## 2014-10-10 DIAGNOSIS — I4891 Unspecified atrial fibrillation: Secondary | ICD-10-CM | POA: Diagnosis not present

## 2014-10-10 DIAGNOSIS — Z950 Presence of cardiac pacemaker: Secondary | ICD-10-CM | POA: Diagnosis not present

## 2014-10-10 DIAGNOSIS — Z09 Encounter for follow-up examination after completed treatment for conditions other than malignant neoplasm: Secondary | ICD-10-CM | POA: Diagnosis not present

## 2014-10-10 NOTE — Patient Instructions (Addendum)
Medication Instructions: - No change  Labwork: Your physician recommends that you have lab work today- BMP  Procedures/Testing: A chest x-ray takes a picture of the organs and structures inside the chest, including the heart, lungs, and blood vessels. This test can show several things, including, whether the heart is enlarges; whether fluid is building up in the lungs; and whether pacemaker / defibrillator leads are still in place.- please go to the [facility] at [facility]- 1st desk on the right to check in for this.   Follow-Up: Remote monitoring is used to monitor your Pacemaker of ICD from home. This monitoring reduces the number of office visits required to check your device to one time per year. It allows [place] to keep an eye on the functioning of your device to ensure it is working properly. You are scheduled for a device check from home on 01/09/15. You may send your transmission at any time that day. If you have a wireless device, the transmission will be sent automatically. After your physician reviews your transmission, you will receive a postcard with your next transmission date.  Your physician wants you to follow-up in: October 2016 with Dr. Fletcher Anon & April 2017 with Dr. Caryl Comes. You will receive a reminder letter in the mail two months in advance. If you don't receive a letter, please call our office to schedule the follow-up appointment.   Any Additional Special Instructions Will Be Listed Below (If Applicable). - None

## 2014-10-10 NOTE — Progress Notes (Signed)
Electrophysiology Office Note   Date:  10/10/2014   ID:  Elizabeth Hall, DOB 1940/12/03, MRN [id number]  PCP:  No primary care provider on file.  Cardiologist:  MA Primary Electrophysiologist:   Sherryl Manges, MD    Chief Complaint  Patient presents with  . Other    No complaints. Meds reviewed verbally with pt.     History of Present Illness: Elizabeth Hall is a 74 y.o. female is seen to establish pacemaker followup.  She has moved from [place] to be with family  She has hx of AFib and underwent AV nodal ablation and pacemaker placement in November 2013. 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 Her ejection fraction deteriorated and she underwent explantation of the previous pacemaker and implantation of biventricular ICD in March 2015.  She has hx of CVA with holding of anticoagulation; currently INR taret 2.5--3 with asa 81;  Hx of visual bleeding    Her last echo report 8/15 was reviewed with EF 40<<30-35%   Today, she denies symptoms of palpitations, chest pain  She has mild shortness of breath, orthopnea, PND, lower extremity edema, but no claudication, dizziness, presyncope, syncope, bleeding, She has chronic visual issues related to prior stroke  The patient is tolerating medications without difficulties and is otherwise without complaint today.    Past Medical History  Diagnosis Date  . Mitral valve regurgitation   . CHF (congestive heart failure)   . Atrial fibrillation   . H. pylori infection   . CKD (chronic kidney disease)   . Hypertension   . Heart murmur   . Skin cancer   . Phlebitis   . Clotting disorder     left arm; occipital lobe  . MI (myocardial infarction) 03/09/2000  . Stroke   . Glaucoma   . Bleeding of eye   . Hx of rheumatic fever   . Chronic systolic heart failure    Past Surgical History  Procedure  Laterality Date  . Defibrillator      [employer] 5086 MRI 52 CM LEAD, SERIAL # LFP [id number]  . Occipital neurectomy    . Ablation    . Insert / replace / remove pacemaker    . Coronary artery bypass graft    . Mitral valve repair    . Tricuspid valve replacement    . Total abdominal hysterectomy    . Gallbladder surgery    . Cataract extraction Bilateral      Current Outpatient Prescriptions  Medication Sig Dispense Refill  . aspirin 81 MG tablet Take 81 mg by mouth daily.    . bimatoprost (LUMIGAN) 0.03 % ophthalmic solution Place 1 drop into both eyes at bedtime.    . bumetanide (BUMEX) 1 MG tablet Take 2 mg by mouth daily. Takes additional 1 mg tablet every other day in the pm.    . carvedilol (COREG) 12.5 MG tablet Take 12.5 mg by mouth 2 (two) times daily with a meal.    . diphenhydrAMINE (SOMINEX) 25 MG tablet Take 25 mg by mouth daily.    [name] ESTRADIOL PO Take 1 mg by mouth daily.     [name] losartan (COZAAR) 25 MG tablet Take 25 mg by mouth daily.    [name] MAGNESIUM PO Take by mouth daily.    . potassium chloride SA (K-DUR,KLOR-CON) 20 MEQ tablet Take 20 mEq by mouth 2 (two)  times daily.    [name] spironolactone (ALDACTONE) 25 MG tablet Take 12.5 mg by mouth daily.    [name] VITAMIN E PO Take by mouth daily.    . WARFARIN SODIUM PO Take 3 mg by mouth as directed.      No current facility-administered medications for this visit.    Allergies:   Biaxin; Ciprofloxacin; and Flagyl   Social History:  The patient  reports that she has quit smoking. Her smoking use included Cigarettes. She does not have any smokeless tobacco history on file. She reports that she drinks alcohol. She reports that she does not use illicit drugs.   Family History:  The patient's    family history includes Heart attack in her father; Heart disease in her father.    ROS:  Please see the history of present illness and past medical history  Otherwise, all other systems were reviewed and were negative.     PHYSICAL  EXAM: VS:  Ht $R'5\' 3"'WH$  (1.6 m)  Wt 144 lb 8 oz (65.545 kg)  BMI 25.60 kg/m2 , BMI Body mass index is 25.6 kg/(m^2). GEN: Well nourished, well developed, in no acute distress HEENT: normal Neck:  JVD flat, carotid bruits, or masses Cardiac: IRREGULAR RATE and RHYTHM ; 2/6  murmurs, rubs, No S4  Back without kyphosis; No CVAT Respiratory:  clear to auscultation bilaterally, normal work of breathing GI: soft, nontender, nondistended, + BS MS: no deformity or atrophy Extremities no clubbing cyanosis tr  edema Skin: warm and dry,  device pocket is well healed without teathering Neuro:  Strength and sensation are intact Psych: euthymic mood, full affect  EKG:  EKG is ordered today. The ekg ordered today shows atrial fib with V pacing with QRS neg V1-V3  Device interrogation is reviewed today in detail.  See PaceArt for details.   Recent Labs: No results found for requested labs within last 365 days.    Lipid Panel  No results found for: CHOL, TRIG, HDL, CHOLHDL, VLDL, LDLCALC, LDLDIRECT   Wt Readings from Last 3 Encounters:  10/10/14 144 lb 8 oz (65.545 kg)  09/18/14 144 lb 4 oz (65.431 kg)      Other studies Reviewed: Additional studies/ records that were reviewed today include: echo reports  Demonstrating : As above    ASSESSMENT AND PLAN: NICM  Complete heart block  S/p AV Ablation  ICD - CRT [employer]  Atrial Fib  Permanent  CVA  Congestive heart failure-chronic-systolic-class II  High risk medication surveillance  ECG-unusual for CRT    The patient has nonischemic cardiomyopathy and is on appropriate guideline directed medical therapy. We have reviewed the importance of medication surveillance in his case related to potassium levels. They should be checked every 3 months. We will check them today.  In addition, we discussed the physiology of loop diuretics and the threshold affect. It is not clear to me that her second dose which she takes in the  afternoon/evening is having any real impact on fluid balance. She will try and identify whether her post diuretic days are any different from her non-post second dose diuretic days.  We have discussed the paradign here for response to ICD shock therapy. She voices understanding   Anticoagulation is appropriate with adjunctive aspirin and I think the upper level of 3.0 is reasonable with her prior history of ocular bleeding  We will check a chest x-ray to look at left ventricular lead placement giving the fact that there is a negative Dominiate QRS  V1-V3    Current medicines are reviewed at length with the patient today.   The patient does not have concerns regarding her medicines.  The following changes were made today:  none  Labs/ tests ordered today include: met profile  and chest x-ray  No orders of the defined types were placed in this encounter.     Disposition:   FU with me  1 year(s)  Signed, Virl Axe, MD  10/10/2014 8:44 AM     [address] [place] [address] [phone] (office) [phone] (fax)

## 2014-10-11 ENCOUNTER — Encounter: Payer: Self-pay | Admitting: Internal Medicine

## 2014-10-11 ENCOUNTER — Telehealth: Payer: Self-pay

## 2014-10-11 LAB — BASIC METABOLIC PANEL
BUN / CREAT RATIO: 21 (ref 11–26)
BUN: 27 mg/dL (ref 8–27)
CHLORIDE: 101 mmol/L (ref 97–108)
CO2: 24 mmol/L (ref 18–29)
Calcium: 9.1 mg/dL (ref 8.7–10.3)
Creatinine, Ser: 1.28 mg/dL — ABNORMAL HIGH (ref 0.57–1.00)
GFR calc Af Amer: 48 mL/min/{1.73_m2} — ABNORMAL LOW (ref >59)
GFR calc non Af Amer: 42 mL/min/{1.73_m2} — ABNORMAL LOW (ref >59)
Glucose: 85 mg/dL (ref 65–99)
Potassium: 4.5 mmol/L (ref 3.5–5.2)
Sodium: 141 mmol/L (ref 134–144)

## 2014-10-11 NOTE — Telephone Encounter (Signed)
Spoke w/ pt.  She reports that she was advised to do a download of her device next month, but she states that she only has a cellphone, no landlines.  She states that between 12:30-2:00 am, her machine transmits to Dr. Cristela Blue in [place]. She would like to know how to go about changing this so that info goes to Dr. Caryl Comes. She would appreciate a call from someone in the device clinic. Thank you.

## 2014-10-11 NOTE — Telephone Encounter (Signed)
Spoke w/ pt and informed her that she does not need a land line phone for her home monitor and that a [employer] rep is working to get her implant card mailed to her and to get her home monitoring care transferred to our clinic. Pt verbalized understanding.

## 2014-10-13 LAB — MDC_IDC_ENUM_SESS_TYPE_INCLINIC
Battery Remaining Percentage: 100 %
Battery Voltage: 3.01 V
HighPow Impedance: 55 Ohm
Lead Channel Impedance Value: 477 Ohm
Lead Channel Impedance Value: 535 Ohm
Lead Channel Pacing Threshold Amplitude: 1 V
Lead Channel Pacing Threshold Pulse Width: 0.4 ms
Lead Channel Setting Pacing Amplitude: 2 V
Lead Channel Setting Pacing Amplitude: 2.5 V
Lead Channel Setting Pacing Pulse Width: 0.4 ms
Lead Channel Setting Pacing Pulse Width: 0.4 ms
MDC IDC MSMT LEADCHNL RV PACING THRESHOLD AMPLITUDE: 0.9 V
MDC IDC MSMT LEADCHNL RV PACING THRESHOLD PULSEWIDTH: 0.4 ms
MDC IDC PG SERIAL: 60765179
MDC IDC SET ZONE DETECTION INTERVAL: 250 ms
MDC IDC SET ZONE DETECTION INTERVAL: 410.96 ms
MDC IDC STAT BRADY RV PERCENT PACED: 98 %
Zone Setting Detection Interval: 370.37 ms

## 2014-10-20 DIAGNOSIS — H401290 Low-tension glaucoma, unspecified eye, stage unspecified: Secondary | ICD-10-CM | POA: Diagnosis not present

## 2014-11-01 ENCOUNTER — Ambulatory Visit (INDEPENDENT_AMBULATORY_CARE_PROVIDER_SITE_OTHER): Payer: Medicare Other

## 2014-11-01 DIAGNOSIS — H401233 Low-tension glaucoma, bilateral, severe stage: Secondary | ICD-10-CM | POA: Diagnosis not present

## 2014-11-01 DIAGNOSIS — I4891 Unspecified atrial fibrillation: Secondary | ICD-10-CM

## 2014-11-01 DIAGNOSIS — Z5181 Encounter for therapeutic drug level monitoring: Secondary | ICD-10-CM

## 2014-11-01 DIAGNOSIS — Z953 Presence of xenogenic heart valve: Secondary | ICD-10-CM

## 2014-11-01 LAB — POCT INR: INR: 1.9

## 2014-11-22 ENCOUNTER — Ambulatory Visit (INDEPENDENT_AMBULATORY_CARE_PROVIDER_SITE_OTHER): Payer: Medicare Other

## 2014-11-22 DIAGNOSIS — I4891 Unspecified atrial fibrillation: Secondary | ICD-10-CM | POA: Diagnosis not present

## 2014-11-22 DIAGNOSIS — Z5181 Encounter for therapeutic drug level monitoring: Secondary | ICD-10-CM | POA: Diagnosis not present

## 2014-11-22 DIAGNOSIS — Z953 Presence of xenogenic heart valve: Secondary | ICD-10-CM | POA: Diagnosis not present

## 2014-11-22 LAB — POCT INR: INR: 2.7

## 2014-12-15 ENCOUNTER — Ambulatory Visit: Payer: Medicare Other | Admitting: Cardiovascular Disease

## 2014-12-15 ENCOUNTER — Encounter: Payer: Self-pay | Admitting: Cardiovascular Disease

## 2014-12-15 ENCOUNTER — Ambulatory Visit (INDEPENDENT_AMBULATORY_CARE_PROVIDER_SITE_OTHER): Payer: Medicare Other | Admitting: Cardiovascular Disease

## 2014-12-15 VITALS — BP 110/80 | HR 76 | Ht 63.0 in | Wt 147.8 lb

## 2014-12-15 DIAGNOSIS — I5021 Acute systolic (congestive) heart failure: Secondary | ICD-10-CM | POA: Diagnosis not present

## 2014-12-15 DIAGNOSIS — I4891 Unspecified atrial fibrillation: Secondary | ICD-10-CM | POA: Diagnosis not present

## 2014-12-15 MED ORDER — SPIRONOLACTONE 25 MG PO TABS: 25.0000 mg | ORAL_TABLET | Freq: Every day | ORAL | Status: DC

## 2014-12-15 MED ORDER — BUMETANIDE 2 MG PO TABS: 2.0000 mg | ORAL_TABLET | Freq: Every day | ORAL | Status: DC

## 2014-12-15 MED ORDER — POTASSIUM CHLORIDE CRYS ER 20 MEQ PO TBCR: 20.0000 meq | EXTENDED_RELEASE_TABLET | Freq: Every day | ORAL | Status: DC

## 2014-12-15 NOTE — Patient Instructions (Signed)
Medication Instructions:  Your physician has recommended you make the following change in your medication:  INCREASE Bumex to 2mg  once per day INCREASE aldactone to 25mg  once per day DECREASE potassium to 76mEq once per day   Labwork: Your physician recommends that you return for lab work in one week: BMET   Testing/Procedures: Your physician has requested that you have an echocardiogram in 3 months. Echocardiography is a painless test that uses sound waves to create images of your heart. It provides your doctor with information about the size and shape of your heart and how well your heart's chambers and valves are working. This procedure takes approximately one hour. There are no restrictions for this procedure.    Follow-Up: Your physician recommends that you schedule a follow-up appointment in: three months with Dr. Fletcher Anon.    Any Other Special Instructions Will Be Listed Below (If Applicable).

## 2014-12-15 NOTE — Assessment & Plan Note (Signed)
She is doing reasonably well with chronic exertional dyspnea. She is currently in [place] class II. I decided to decrease the dose of Bumex to 2 mg once daily and prescription afternoon dose. I also increased the dose of spironolactone to 25 mg once daily and decreased potassium chloride 20 mEq once daily. Check basic metabolic profile in one week. I requested an echocardiogram to be done in 3 months with a follow-up to establish baseline ejection fraction here.

## 2014-12-15 NOTE — Progress Notes (Signed)
HPI  This is a 74 year old female who is here today for a follow-up visit. She moved from [place] to be close to her daughter. She has known history of chronic systolic heart failure due to nonischemic cardiomyopathy and possibly valvular heart disease. She used to be followed by cardiology associates of [place]. Congestive heart failure was diagnosed in 2007. Most recent ejection fraction was 40% in September 2015. She has known history of atrial fibrillation. She underwent AV nodal ablation and pacemaker placement in November 2013. 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 Her ejection fraction deteriorated and she underwent explantation of the previous pacemaker and implantation of biventricular ICD in March 2015. She reports possible CVA in the past when her warfarin was held for a procedure. Since then, she has been bridged with low molecular weight heparin. She reports that her recommended INR has been between 2.5-3. She has been doing reasonably well and denies chest pain . She has chronic exertional dyspnea with moderate activities with no orthopnea or PND. She was seen by Dr. Caryl Comes who dropped a pacemaker rate at night. She is a previous smoker and quit more than 30 years ago.   Allergies  Allergen Reactions  . Biaxin [Clarithromycin]   . Ciprofloxacin   . Flagyl [Metronidazole]      Current Outpatient Prescriptions on File Prior to Visit  Medication Sig Dispense Refill  . aspirin 81 MG tablet Take 81 mg by mouth daily.    . bimatoprost (LUMIGAN) 0.03 % ophthalmic solution Place 1 drop into both eyes at bedtime.    . bumetanide (BUMEX) 1 MG tablet Take 2 mg by mouth daily. Takes additional 1 mg tablet every other day in the pm.    . carvedilol (COREG) 12.5 MG tablet Take 12.5 mg by mouth 2 (two) times daily with a meal.      . diphenhydrAMINE (SOMINEX) 25 MG tablet Take 25 mg by mouth daily.    . DiphenhydrAMINE HCl (BENADRYL ALLERGY PO) Take by mouth at bedtime.    [name] ESTRADIOL PO Take 1 mg by mouth daily.     [name] losartan (COZAAR) 25 MG tablet Take 25 mg by mouth daily.    [name] MAGNESIUM PO Take by mouth daily.    . potassium chloride SA (K-DUR,KLOR-CON) 20 MEQ tablet Take 20 mEq by mouth 2 (two) times daily.    [name] spironolactone (ALDACTONE) 25 MG tablet Take 12.5 mg by mouth daily.    [name] VITAMIN E PO Take by mouth daily.    . WARFARIN SODIUM PO Take 3 mg by mouth as directed.      No current facility-administered medications on file prior to visit.     Past Medical History  Diagnosis Date  . Mitral valve regurgitation   . CHF (congestive heart failure)   . Atrial fibrillation   . H. pylori infection   . CKD (chronic kidney disease)   . Hypertension   . Heart murmur   . Skin cancer   . Phlebitis   . Clotting disorder     left arm; occipital lobe  . MI (myocardial infarction) 03/09/2000  . Stroke   . Glaucoma   . Bleeding of eye   . Hx of rheumatic fever   . Chronic systolic heart failure      Past Surgical History  Procedure Laterality Date  . Defibrillator  MEDTRONIC 5086 MRI 52 CM LEAD, SERIAL # LFP [id number]  . Occipital neurectomy    . Ablation    . Coronary artery bypass graft    . Mitral valve repair    . Tricuspid valve replacement    . Total abdominal hysterectomy    . Gallbladder surgery    . Cataract extraction Bilateral   . Insert / replace / remove pacemaker       Family History  Problem Relation Age of Onset  . Heart disease Father   . Heart attack Father      History   Social History  . Marital Status: Married    Spouse Name: N/A  . Number of Children: N/A  . Years of Education: N/A   Occupational History  . Not on file.   Social History Main Topics  . Smoking status: Former Smoker    Types: Cigarettes  . Smokeless tobacco: Not on file  . Alcohol Use:  Yes  . Drug Use: No  . Sexual Activity: Not on file   Other Topics Concern  . Not on file   Social History Narrative     ROS A 10 point review of system was performed. It is negative other than that mentioned in the history of present illness.   PHYSICAL EXAM   BP 110/80 mmHg  Pulse 76  Ht 5\' 3"  (1.6 m)  Wt 147 lb 12 oz (67.019 kg)  BMI 26.18 kg/m2 Constitutional: She is oriented to person, place, and time. She appears well-developed and well-nourished. No distress.  HENT: No nasal discharge.  Head: Normocephalic and atraumatic.  Eyes: Pupils are equal and round. No discharge.  Neck: Normal range of motion. Neck supple. No JVD present. No thyromegaly present.  Cardiovascular: Normal rate, regular rhythm, normal heart sounds. Exam reveals no gallop and no friction rub. No murmur heard.  Pulmonary/Chest: Effort normal and breath sounds normal. No stridor. No respiratory distress. She has no wheezes. She has no rales. She exhibits no tenderness.  Abdominal: Soft. Bowel sounds are normal. She exhibits no distension. There is no tenderness. There is no rebound and no guarding.  Musculoskeletal: Normal range of motion. She exhibits no edema and no tenderness.  Neurological: She is alert and oriented to person, place, and time. Coordination normal.  Skin: Skin is warm and dry. No rash noted. She is not diaphoretic. No erythema. No pallor.  Psychiatric: She has a normal mood and affect. Her behavior is normal. Judgment and thought content normal.     HAL:[id number] ventricular pacemaker  Pacemaker ECG, No further analysis  INSUFFICIENT DATA   ASSESSMENT AND PLAN

## 2014-12-15 NOTE — Assessment & Plan Note (Signed)
Continue Martinec-term anticoagulation with warfarin.

## 2014-12-15 NOTE — Assessment & Plan Note (Signed)
This will be checked with echocardiogram in few months.

## 2014-12-20 ENCOUNTER — Other Ambulatory Visit (INDEPENDENT_AMBULATORY_CARE_PROVIDER_SITE_OTHER): Payer: Medicare Other

## 2014-12-20 ENCOUNTER — Ambulatory Visit (INDEPENDENT_AMBULATORY_CARE_PROVIDER_SITE_OTHER): Payer: Medicare Other

## 2014-12-20 DIAGNOSIS — I4891 Unspecified atrial fibrillation: Secondary | ICD-10-CM | POA: Diagnosis not present

## 2014-12-20 DIAGNOSIS — I5021 Acute systolic (congestive) heart failure: Secondary | ICD-10-CM | POA: Diagnosis not present

## 2014-12-20 DIAGNOSIS — Z5181 Encounter for therapeutic drug level monitoring: Secondary | ICD-10-CM | POA: Diagnosis not present

## 2014-12-20 DIAGNOSIS — Z953 Presence of xenogenic heart valve: Secondary | ICD-10-CM

## 2014-12-20 LAB — POCT INR: INR: 2.4

## 2014-12-21 LAB — BASIC METABOLIC PANEL
BUN / CREAT RATIO: 17 (ref 11–26)
BUN: 22 mg/dL (ref 8–27)
CALCIUM: 9.9 mg/dL (ref 8.7–10.3)
CO2: 19 mmol/L (ref 18–29)
Chloride: 104 mmol/L (ref 97–108)
Creatinine, Ser: 1.28 mg/dL — ABNORMAL HIGH (ref 0.57–1.00)
GFR calc Af Amer: 48 mL/min/{1.73_m2} — ABNORMAL LOW (ref >59)
GFR calc non Af Amer: 42 mL/min/{1.73_m2} — ABNORMAL LOW (ref >59)
Glucose: 81 mg/dL (ref 65–99)
POTASSIUM: 5.8 mmol/L — AB (ref 3.5–5.2)
Sodium: 148 mmol/L — ABNORMAL HIGH (ref 134–144)

## 2014-12-22 ENCOUNTER — Other Ambulatory Visit: Payer: Self-pay

## 2014-12-22 DIAGNOSIS — E875 Hyperkalemia: Secondary | ICD-10-CM

## 2014-12-27 ENCOUNTER — Other Ambulatory Visit (INDEPENDENT_AMBULATORY_CARE_PROVIDER_SITE_OTHER): Payer: Medicare Other | Admitting: *Deleted

## 2014-12-27 DIAGNOSIS — E875 Hyperkalemia: Secondary | ICD-10-CM | POA: Diagnosis not present

## 2014-12-28 LAB — BASIC METABOLIC PANEL
BUN/Creatinine Ratio: 27 — ABNORMAL HIGH (ref 11–26)
BUN: 40 mg/dL — AB (ref 8–27)
CALCIUM: 9.3 mg/dL (ref 8.7–10.3)
CO2: 22 mmol/L (ref 18–29)
Chloride: 96 mmol/L — ABNORMAL LOW (ref 97–108)
Creatinine, Ser: 1.48 mg/dL — ABNORMAL HIGH (ref 0.57–1.00)
GFR calc Af Amer: 40 mL/min/{1.73_m2} — ABNORMAL LOW (ref >59)
GFR, EST NON AFRICAN AMERICAN: 35 mL/min/{1.73_m2} — AB (ref >59)
GLUCOSE: 158 mg/dL — AB (ref 65–99)
POTASSIUM: 4.3 mmol/L (ref 3.5–5.2)
Sodium: 137 mmol/L (ref 134–144)

## 2015-01-01 ENCOUNTER — Other Ambulatory Visit: Payer: Self-pay

## 2015-01-01 DIAGNOSIS — I509 Heart failure, unspecified: Secondary | ICD-10-CM

## 2015-01-02 ENCOUNTER — Ambulatory Visit (INDEPENDENT_AMBULATORY_CARE_PROVIDER_SITE_OTHER): Payer: Medicare Other | Admitting: Family Medicine

## 2015-01-02 ENCOUNTER — Encounter: Payer: Self-pay | Admitting: Family Medicine

## 2015-01-02 VITALS — BP 112/62 | HR 78 | Temp 97.4°F | Ht 63.5 in | Wt 149.8 lb

## 2015-01-02 DIAGNOSIS — E785 Hyperlipidemia, unspecified: Secondary | ICD-10-CM

## 2015-01-02 DIAGNOSIS — I639 Cerebral infarction, unspecified: Secondary | ICD-10-CM | POA: Diagnosis not present

## 2015-01-02 DIAGNOSIS — H409 Unspecified glaucoma: Secondary | ICD-10-CM

## 2015-01-02 DIAGNOSIS — I5022 Chronic systolic (congestive) heart failure: Secondary | ICD-10-CM

## 2015-01-02 DIAGNOSIS — I10 Essential (primary) hypertension: Secondary | ICD-10-CM | POA: Insufficient documentation

## 2015-01-02 DIAGNOSIS — I4891 Unspecified atrial fibrillation: Secondary | ICD-10-CM | POA: Diagnosis not present

## 2015-01-02 DIAGNOSIS — N189 Chronic kidney disease, unspecified: Secondary | ICD-10-CM

## 2015-01-02 NOTE — Patient Instructions (Addendum)
Return in 5 months for medicare wellness visit Call your insurance about the shingles shot to see if it is covered or how much it would cost and where is cheaper (here or pharmacy).  If you want to receive here, call for nurse visit. Nice to meet you today! Call us with questions.

## 2015-01-02 NOTE — Progress Notes (Signed)
BP 112/62 mmHg  Pulse 78  Temp(Src) 97.4 F (36.3 C) (Oral)  Ht 5' 3.5" (1.613 m)  Wt 149 lb 12.8 oz (67.949 kg)  BMI 26.12 kg/m2   CC: new pt to establish  Subjective:    Patient ID: Elizabeth Hall, female    DOB: [birth year], 74 y.o.   MRN: [id number]  HPI: Elizabeth Hall is a 74 y.o. female presenting on 01/02/2015 for [place] in March from [place]. Husband dx with mult myeloma 02/05/2012.   Atrial fibrillation s/p ablation and pacemaker 04/2012, pacer + defib 08/2013. H/o rheumatic fever age 59 yo, mitral valve repair + tricuspid band 2013. She is on coumadin. Followed by Dr Fletcher Anon. Followed by coumadin clinic at cardiologist's office. Goal INR 2.5-3. H/o eye bleed, h/o TIA when taken off coumadin without bridging.   Slow taper of estradiol, currently on 1/2 tab of 1mg  daily.   CKD - last Cr 1.48. Prior was seeing nephrologist in [place]. Unclear cause.  Lab Results  Component Value Date   CREATININE 1.48* 12/27/2014    Preventative: Last CPE 05/2014 Well woman - last pap smear 2015. Gets every 2 years.  Mammogram - 04/2014. Gets yearly ultrasounds due to dense breasts.  G1P1 Yearly flu shot Tetanus - 2008 Pneumovax DUE, prevnar 2015 zostavax - undecided.  Lives with husband, 1 cat Occupation: retired Therapist, sports (2007), worked for [employer] Edu: nursing school Activity: keeps 25 yo grandson Diet: good water, fruits/vegetables  Relevant past medical, surgical, family and social history reviewed and updated as indicated. Interim medical history since our last visit reviewed. Allergies and medications reviewed and updated. Current Outpatient Prescriptions on File Prior to Visit  Medication Sig  . aspirin 81 MG tablet Take 81 mg by mouth daily.  . bimatoprost (LUMIGAN) 0.03 % ophthalmic solution Place 1 drop into both eyes at bedtime.  . bumetanide (BUMEX) 2 MG tablet Take 1 tablet (2 mg total) by mouth daily.  . carvedilol (COREG) 12.5 MG tablet Take  12.5 mg by mouth 2 (two) times daily with a meal.  . DiphenhydrAMINE HCl (BENADRYL ALLERGY PO) Take by mouth at bedtime.  [name] losartan (COZAAR) 25 MG tablet Take 25 mg by mouth daily.  [name] MAGNESIUM PO Take by mouth daily.  . potassium chloride SA (K-DUR,KLOR-CON) 20 MEQ tablet Take 1 tablet (20 mEq total) by mouth daily.  [name] spironolactone (ALDACTONE) 25 MG tablet Take 1 tablet (25 mg total) by mouth daily.  [name] VITAMIN E PO Take by mouth daily.  . WARFARIN SODIUM PO Take 3 mg by mouth as directed.    No current facility-administered medications on file prior to visit.    Review of Systems Per HPI unless specifically indicated above     Objective:    BP 112/62 mmHg  Pulse 78  Temp(Src) 97.4 F (36.3 C) (Oral)  Ht 5' 3.5" (1.613 m)  Wt 149 lb 12.8 oz (67.949 kg)  BMI 26.12 kg/m2  Wt Readings from Last 3 Encounters:  01/02/15 149 lb 12.8 oz (67.949 kg)  12/15/14 147 lb 12 oz (67.019 kg)  10/10/14 144 lb 8 oz (65.545 kg)    Physical Exam  Constitutional: She appears well-developed and well-nourished. No distress.  HENT:  Mouth/Throat: Oropharynx is clear and moist. No oropharyngeal exudate.  Cardiovascular: Normal rate, regular rhythm, normal heart sounds and intact distal pulses.   No murmur heard. Pulmonary/Chest: Effort normal and breath sounds normal. No respiratory distress. She has no wheezes. She has no  rales.  Musculoskeletal: She exhibits no edema.  Skin: Skin is warm and dry. No rash noted.  Psychiatric: She has a normal mood and affect.  Nursing note and vitals reviewed.  Results for orders placed or performed in visit on 73/42/87  Basic Metabolic Panel (BMET)  Result Value Ref Range   Glucose 158 (H) 65 - 99 mg/dL   BUN 40 (H) 8 - 27 mg/dL   Creatinine, Ser 1.48 (H) 0.57 - 1.00 mg/dL   GFR calc non Af Amer 35 (L) >59 mL/min/1.73   GFR calc Af Amer 40 (L) >59 mL/min/1.73   BUN/Creatinine Ratio 27 (H) 11 - 26   Sodium 137 134 - 144 mmol/L   Potassium 4.3 3.5 - 5.2  mmol/L   Chloride 96 (L) 97 - 108 mmol/L   CO2 22 18 - 29 mmol/L   Calcium 9.3 8.7 - 10.3 mg/dL      Assessment & Plan:  Will review records she brings. Problem List Items Addressed This Visit    Atrial fibrillation, unspecified    On warfarin for this, as well as aspirin daily. Followed by coumadin clinic an sees cardiologist Dr Fletcher Anon.      Chronic systolic heart failure    Continue medical regimen - ARB, bumex, carvedilol, and spironolactone. Followed by cards.      CKD (chronic kidney disease)    Will review records pt brings. Prior saw nephrologist in [facility] Lab Results  Component Value Date   CREATININE 1.48* 12/27/2014        CVA (cerebral infarction)    Will review records pt brings.      Dyslipidemia - Primary    Off statin. Check FLP next fasting labs.      Glaucoma   Hypertension    Chronic, stable. Continue current regimen.          Follow up plan: Return in about 5 months (around 06/04/2015), or as needed, for medicare wellness visit.

## 2015-01-02 NOTE — Progress Notes (Signed)
Pre visit review using our clinic review tool, if applicable. No additional management support is needed unless otherwise documented below in the visit note. 

## 2015-01-03 ENCOUNTER — Encounter: Payer: Self-pay | Admitting: Internal Medicine

## 2015-01-06 DIAGNOSIS — I639 Cerebral infarction, unspecified: Secondary | ICD-10-CM

## 2015-01-06 DIAGNOSIS — H409 Unspecified glaucoma: Secondary | ICD-10-CM | POA: Insufficient documentation

## 2015-01-06 DIAGNOSIS — N183 Chronic kidney disease, stage 3 unspecified: Secondary | ICD-10-CM | POA: Insufficient documentation

## 2015-01-06 DIAGNOSIS — Z8673 Personal history of transient ischemic attack (TIA), and cerebral infarction without residual deficits: Secondary | ICD-10-CM | POA: Insufficient documentation

## 2015-01-06 NOTE — Assessment & Plan Note (Signed)
Chronic, stable. Continue current regimen. 

## 2015-01-06 NOTE — Assessment & Plan Note (Signed)
Will review records pt brings.

## 2015-01-06 NOTE — Assessment & Plan Note (Signed)
Off statin. Check FLP next fasting labs.

## 2015-01-06 NOTE — Assessment & Plan Note (Signed)
Continue medical regimen - ARB, bumex, carvedilol, and spironolactone. Followed by cards.

## 2015-01-06 NOTE — Assessment & Plan Note (Signed)
Will review records pt brings. Prior saw nephrologist in [facility] Lab Results  Component Value Date   CREATININE 1.48* 12/27/2014

## 2015-01-06 NOTE — Assessment & Plan Note (Signed)
On warfarin for this, as well as aspirin daily. Followed by coumadin clinic an sees cardiologist Dr Fletcher Anon.

## 2015-01-09 ENCOUNTER — Ambulatory Visit (INDEPENDENT_AMBULATORY_CARE_PROVIDER_SITE_OTHER): Payer: Medicare Other | Admitting: *Deleted

## 2015-01-09 ENCOUNTER — Telehealth: Payer: Self-pay | Admitting: Internal Medicine

## 2015-01-09 DIAGNOSIS — I4891 Unspecified atrial fibrillation: Secondary | ICD-10-CM | POA: Diagnosis not present

## 2015-01-09 NOTE — Progress Notes (Signed)
Remote ICD transmission.   

## 2015-01-09 NOTE — Telephone Encounter (Signed)
Follow up   4. Are you calling to see if we received your device transmission? Y   Please call

## 2015-01-09 NOTE — Telephone Encounter (Signed)
Let pt know remote is automatic and was received. All results normal. No episodes or alerts. Pt thankful.

## 2015-01-17 ENCOUNTER — Encounter: Payer: Self-pay | Admitting: *Deleted

## 2015-01-17 ENCOUNTER — Ambulatory Visit (INDEPENDENT_AMBULATORY_CARE_PROVIDER_SITE_OTHER): Payer: Medicare Other

## 2015-01-17 DIAGNOSIS — Z953 Presence of xenogenic heart valve: Secondary | ICD-10-CM | POA: Diagnosis not present

## 2015-01-17 DIAGNOSIS — I4891 Unspecified atrial fibrillation: Secondary | ICD-10-CM | POA: Diagnosis not present

## 2015-01-17 DIAGNOSIS — Z5181 Encounter for therapeutic drug level monitoring: Secondary | ICD-10-CM

## 2015-01-17 LAB — CUP PACEART REMOTE DEVICE CHECK
Brady Statistic RV Percent Paced: 98 %
HIGH POWER IMPEDANCE MEASURED VALUE: 54 Ohm
Lead Channel Impedance Value: 448 Ohm
Lead Channel Impedance Value: 564 Ohm
Lead Channel Pacing Threshold Amplitude: 0.8 V
Lead Channel Pacing Threshold Pulse Width: 0.4 ms
Lead Channel Setting Pacing Amplitude: 2 V
Lead Channel Setting Pacing Pulse Width: 0.4 ms
MDC IDC SESS DTM: 20160727125440
MDC IDC SET LEADCHNL LV PACING AMPLITUDE: 2.5 V
MDC IDC SET LEADCHNL LV PACING PULSEWIDTH: 0.4 ms
MDC IDC SET ZONE DETECTION INTERVAL: 370.37 ms
Pulse Gen Serial Number: 60765179
Zone Setting Detection Interval: 250 ms
Zone Setting Detection Interval: 410.96 ms

## 2015-01-17 LAB — POCT INR: INR: 2.3

## 2015-01-22 LAB — COMPREHENSIVE METABOLIC PANEL
AST: 18 U/L (ref 15–37)
Albumin: 4.3 g/dL (ref 3.4–5.0)
Alkaline Phosphatase: 44 U/L — ABNORMAL LOW (ref 46–116)
Anion Gap: 11 (ref 7–16)
BILIRUBIN TOTAL: 0.8 mg/dL (ref 0.2–1.0)
BUN: 24 mg/dL — ABNORMAL HIGH (ref 7–18)
CALCIUM: 9 mg/dL (ref 8.5–10.1)
Chloride: 100 mmol/L (ref 98–107)
Co2: 24 mmol/L (ref 21–32)
Creatinine: 1.26 mg/dL (ref 0.60–1.30)
EGFR (Non-African Amer.): 44 — ABNORMAL LOW
GFR CALC AF AMER: 54 — AB
GLUCOSE: 102 mg/dL — AB (ref 65–99)
Potassium: 4.5 mmol/L (ref 3.5–5.1)
SGPT (ALT): 13 U/L — ABNORMAL LOW (ref 14–63)
SODIUM: 135 mmol/L — AB (ref 136–145)
TOTAL PROTEIN: 7.8 g/dL (ref 6.4–8.2)

## 2015-01-29 ENCOUNTER — Other Ambulatory Visit (INDEPENDENT_AMBULATORY_CARE_PROVIDER_SITE_OTHER): Payer: Medicare Other | Admitting: *Deleted

## 2015-01-29 DIAGNOSIS — I509 Heart failure, unspecified: Secondary | ICD-10-CM | POA: Diagnosis not present

## 2015-01-30 ENCOUNTER — Ambulatory Visit (INDEPENDENT_AMBULATORY_CARE_PROVIDER_SITE_OTHER): Payer: Medicare Other | Admitting: Family Medicine

## 2015-01-30 ENCOUNTER — Encounter: Payer: Self-pay | Admitting: Family Medicine

## 2015-01-30 VITALS — BP 109/70 | HR 80 | Resp 20 | Ht 63.0 in | Wt 149.2 lb

## 2015-01-30 DIAGNOSIS — R300 Dysuria: Secondary | ICD-10-CM

## 2015-01-30 DIAGNOSIS — I639 Cerebral infarction, unspecified: Secondary | ICD-10-CM | POA: Diagnosis not present

## 2015-01-30 DIAGNOSIS — Z79890 Hormone replacement therapy (postmenopausal): Secondary | ICD-10-CM | POA: Insufficient documentation

## 2015-01-30 LAB — POCT URINALYSIS DIPSTICK
GLUCOSE UA: NEGATIVE
Ketones, UA: NEGATIVE
NITRITE UA: NEGATIVE

## 2015-01-30 LAB — BASIC METABOLIC PANEL
BUN / CREAT RATIO: 21 (ref 11–26)
BUN: 29 mg/dL — AB (ref 8–27)
CO2: 20 mmol/L (ref 18–29)
Calcium: 9.2 mg/dL (ref 8.7–10.3)
Chloride: 102 mmol/L (ref 97–108)
Creatinine, Ser: 1.38 mg/dL — ABNORMAL HIGH (ref 0.57–1.00)
GFR calc Af Amer: 44 mL/min/{1.73_m2} — ABNORMAL LOW (ref >59)
GFR calc non Af Amer: 38 mL/min/{1.73_m2} — ABNORMAL LOW (ref >59)
GLUCOSE: 113 mg/dL — AB (ref 65–99)
Potassium: 4.7 mmol/L (ref 3.5–5.2)
Sodium: 141 mmol/L (ref 134–144)

## 2015-01-30 NOTE — Addendum Note (Signed)
Addended by: [name] on: 01/30/2015 04:39 PM   Modules accepted: Level of Service

## 2015-01-30 NOTE — Assessment & Plan Note (Signed)
Since she has a lack of symptoms on this does--asked her to decrease it to 2x/wk.  May then continue to decrease dosage to 0.25 mg 2x/wk or continue 0.5 mg once weekly then may d/c. Advised that this is recommended due to other health issues.

## 2015-01-30 NOTE — Patient Instructions (Addendum)
Try some repHresh wipes for your discomfort. We will check the culture for infection.  Hormone Therapy At menopause, your body begins making less estrogen and progesterone hormones. This causes the body to stop having menstrual periods. This is because estrogen and progesterone hormones control your periods and menstrual cycle. A lack of estrogen may cause symptoms such as:  Hot flushes (or hot flashes).  Vaginal dryness.  Dry skin.  Loss of sex drive.  Risk of bone loss (osteoporosis). When this happens, you may choose to take hormone therapy to get back the estrogen lost during menopause. When the hormone estrogen is given alone, it is usually referred to as ET (Estrogen Therapy). When the hormone progestin is combined with estrogen, it is generally called HT (Hormone Therapy). This was formerly known as hormone replacement therapy (HRT). Your caregiver can help you make a decision on what will be best for you. The decision to use HT seems to change often as new studies are done. Many studies do not agree on the benefits of hormone replacement therapy. LIKELY BENEFITS OF HT INCLUDE PROTECTION FROM:  Hot Flushes (also called hot flashes) - A hot flush is a sudden feeling of heat that spreads over the face and body. The skin may redden like a blush. It is connected with sweats and sleep disturbance. Women going through menopause may have hot flushes a few times a month or several times per day depending on the woman.  Osteoporosis (bone loss)- Estrogen helps guard against bone loss. After menopause, a woman's bones slowly lose calcium and become weak and brittle. As a result, bones are more likely to break. The hip, wrist, and spine are affected most often. Hormone therapy can help slow bone loss after menopause. Weight bearing exercise and taking calcium with vitamin D also can help prevent bone loss. There are also medications that your caregiver can prescribe that can help prevent  osteoporosis.  Vaginal Dryness - Loss of estrogen causes changes in the vagina. Its lining may become thin and dry. These changes can cause pain and bleeding during sexual intercourse. Dryness can also lead to infections. This can cause burning and itching. (Vaginal estrogen treatment can help relieve pain, itching, and dryness.)  Urinary Tract Infections are more common after menopause because of lack of estrogen. Some women also develop urinary incontinence because of low estrogen levels in the vagina and bladder.  Possible other benefits of estrogen include a positive effect on mood and short-term memory in women. RISKS AND COMPLICATIONS  Using estrogen alone without progesterone causes the lining of the uterus to grow. This increases the risk of lining of the uterus (endometrial) cancer. Your caregiver should give another hormone called progestin if you have a uterus.  Women who take combined (estrogen and progestin) HT appear to have an increased risk of breast cancer. The risk appears to be small, but increases throughout the time that HT is taken.  Combined therapy also makes the breast tissue slightly denser which makes it harder to read mammograms (breast X-rays).  Combined, estrogen and progesterone therapy can be taken together every day, in which case there may be spotting of blood. HT therapy can be taken cyclically in which case you will have menstrual periods. Cyclically means HT is taken for a set amount of days, then not taken, then this process is repeated.  HT may increase the risk of stroke, heart attack, breast cancer and forming blood clots in your leg.  Transdermal estrogen (estrogen that is absorbed  through the skin with a patch or a cream) may have more positive results with:  Cholesterol.  Blood pressure.  Blood clots. Having the following conditions may indicate you should not have HT:  Endometrial cancer.  Liver disease.  Breast cancer.  Heart  disease.  History of blood clots.  Stroke. TREATMENT   If you choose to take HT and have a uterus, usually estrogen and progestin are prescribed.  Your caregiver will help you decide the best way to take the medications.  Possible ways to take estrogen include:  Pills.  Patches.  Gels.  Sprays.  Vaginal estrogen cream, rings and tablets.  It is best to take the lowest dose possible that will help your symptoms and take them for the shortest period of time that you can.  Hormone therapy can help relieve some of the problems (symptoms) that affect women at menopause. Before making a decision about HT, talk to your caregiver about what is best for you. Be well informed and comfortable with your decisions. HOME CARE INSTRUCTIONS   Follow your caregivers advice when taking the medications.  A Pap test is done to screen for cervical cancer.  The first Pap test should be done at age 41.  Between ages 22 and 21, Pap tests are repeated every 2 years.  Beginning at age 57, you are advised to have a Pap test every 3 years as [place] as your past 3 Pap tests have been normal.  Some women have medical problems that increase the chance of getting cervical cancer. Talk to your caregiver about these problems. It is especially important to talk to your caregiver if a new problem develops soon after your last Pap test. In these cases, your caregiver may recommend more frequent screening and Pap tests.  The above recommendations are the same for women who have or have not gotten the vaccine for HPV (Human Papillomavirus).  If you had a hysterectomy for a problem that was not a cancer or a condition that could lead to cancer, then you no longer need Pap tests. However, even if you no longer need a Pap test, a regular exam is a good idea to make sure no other problems are starting.   If you are between ages 23 and 60, and you have had normal Pap tests going back 10 years, you no longer need Pap  tests. However, even if you no longer need a Pap test, a regular exam is a good idea to make sure no other problems are starting.   If you have had past treatment for cervical cancer or a condition that could lead to cancer, you need Pap tests and screening for cancer for at least 20 years after your treatment.  If Pap tests have been discontinued, risk factors (such as a new sexual partner) need to be re-assessed to determine if screening should be resumed.  Some women may need screenings more often if they are at high risk for cervical cancer.  Get mammograms done as per the advice of your caregiver. SEEK IMMEDIATE MEDICAL CARE IF:  You develop abnormal vaginal bleeding.  You have pain or swelling in your legs, shortness of breath, or chest pain.  You develop dizziness or headaches.  You have lumps or changes in your breasts or armpits.  You have slurred speech.  You develop weakness or numbness of your arms or legs.  You have pain, burning, or bleeding when urinating.  You develop abdominal pain. Document Released: 03/08/2003 Document Revised:  09/01/2011 Document Reviewed: 06/26/2010 ExitCare Patient Information 2015 [place], [place]. This information is not intended to replace advice given to you by your health care provider. Make sure you discuss any questions you have with your health care provider.

## 2015-01-30 NOTE — Progress Notes (Signed)
    Subjective:    Patient ID: Elizabeth Hall is a 74 y.o. female presenting with hormone therapy consult  on 01/30/2015  HPI: Has been on HRT for since 1991. Once she tried to come off her HRT, she would have arrhythmia. Once she had an ablation of the heart, this has not been an issue.  She reports that she was on 2 mg of estradiol, then has decreased to 1 mg and is currently on 0.5 mg 3x/wk. Has been on this x 3 months.  She has not had any true hot flashes nor night sweats Feels like she might have a UTI.  Started after a panty liner, but feels urethra is irritated.  It is burning with urination. Has discomfort at night. Wonders if she might have dryness related to loss of estrogen.  Review of Systems  Constitutional: Negative for fever and chills.  HENT: Negative for congestion and sore throat.   Respiratory: Negative for cough, chest tightness and shortness of breath.   Cardiovascular: Negative for chest pain, palpitations and leg swelling.  Gastrointestinal: Negative for nausea, vomiting and abdominal pain.  Genitourinary: Negative for dysuria, hematuria and vaginal bleeding.  Skin: Negative for rash.  Neurological: Negative for seizures and facial asymmetry.  Psychiatric/Behavioral: Negative for behavioral problems and dysphoric mood.      Objective:    BP 109/70 mmHg  Pulse 80  Resp 20  Ht 5\' 3"  (1.6 m)  Wt 149 lb 3.2 oz (67.677 kg)  BMI 26.44 kg/m2 Physical Exam  Constitutional: She is oriented to person, place, and time. She appears well-developed and well-nourished. No distress.  HENT:  Head: Normocephalic and atraumatic.  Eyes: No scleral icterus.  Neck: Neck supple.  Cardiovascular: Normal rate and regular rhythm.   No murmur heard. Pulmonary/Chest: Effort normal and breath sounds normal.  Abdominal: Soft. She exhibits no mass.  Neurological: She is alert and oriented to person, place, and time.  Skin: Skin is warm and dry.  Psychiatric: She has a normal mood and  affect.        Assessment & Plan:   Problem List Items Addressed This Visit      Unprioritized   Postmenopausal HRT (hormone replacement therapy) - Primary    Since she has a lack of symptoms on this does--asked her to decrease it to 2x/wk.  May then continue to decrease dosage to 0.25 mg 2x/wk or continue 0.5 mg once weekly then may d/c. Advised that this is recommended due to other health issues.       Other Visit Diagnoses    Dysuria        will check culture and treat if positive. If negative, may use RepHresh wipes prn.    Relevant Orders    POCT Urinalysis Dipstick (Completed)    Urine Culture      Return in about 6 months (around 08/02/2015).   PRATT,TANYA S 01/30/2015 3:26 PM

## 2015-02-01 LAB — URINE CULTURE: Colony Count: 25000

## 2015-02-02 ENCOUNTER — Telehealth: Payer: Self-pay | Admitting: *Deleted

## 2015-02-02 DIAGNOSIS — R300 Dysuria: Secondary | ICD-10-CM

## 2015-02-02 MED ORDER — SULFAMETHOXAZOLE-TRIMETHOPRIM 800-160 MG PO TABS: 1.0000 | ORAL_TABLET | Freq: Two times a day (BID) | ORAL | Status: DC

## 2015-02-02 NOTE — Telephone Encounter (Signed)
Informed pt of result and medication and sent rx to pharmacy.

## 2015-02-02 NOTE — Telephone Encounter (Signed)
Spoke with patient - she started Bactrim this morning for a 5 day course. Made an appointment for a nurse visit Monday in the [facility] office - nurse will call us with INR result.

## 2015-02-02 NOTE — Telephone Encounter (Signed)
-----   Message from [name], MD sent at 02/02/2015 10:31 AM EDT ----- Call in Septra ds bid x 5 d for pt.

## 2015-02-02 NOTE — Telephone Encounter (Signed)
Pt was just put on sulfa generic for a UTI and just wanted to let us know.  Please call if we have any concerns.

## 2015-02-05 ENCOUNTER — Ambulatory Visit (INDEPENDENT_AMBULATORY_CARE_PROVIDER_SITE_OTHER): Payer: Medicare Other | Admitting: Pharmacist

## 2015-02-05 DIAGNOSIS — I4891 Unspecified atrial fibrillation: Secondary | ICD-10-CM

## 2015-02-05 DIAGNOSIS — Z5181 Encounter for therapeutic drug level monitoring: Secondary | ICD-10-CM

## 2015-02-05 DIAGNOSIS — Z953 Presence of xenogenic heart valve: Secondary | ICD-10-CM

## 2015-02-05 LAB — POCT INR: INR: 3.2

## 2015-02-06 ENCOUNTER — Encounter: Payer: Self-pay | Admitting: Cardiology

## 2015-02-14 ENCOUNTER — Encounter: Payer: Self-pay | Admitting: Internal Medicine

## 2015-02-18 ENCOUNTER — Encounter: Payer: Self-pay | Admitting: Family Medicine

## 2015-02-20 ENCOUNTER — Ambulatory Visit (INDEPENDENT_AMBULATORY_CARE_PROVIDER_SITE_OTHER): Payer: Medicare Other

## 2015-02-20 ENCOUNTER — Other Ambulatory Visit: Payer: Self-pay

## 2015-02-20 DIAGNOSIS — I5021 Acute systolic (congestive) heart failure: Secondary | ICD-10-CM

## 2015-02-21 ENCOUNTER — Encounter: Payer: Self-pay | Admitting: *Deleted

## 2015-02-21 ENCOUNTER — Encounter: Payer: Self-pay | Admitting: Family Medicine

## 2015-02-22 ENCOUNTER — Telehealth: Payer: Self-pay | Admitting: *Deleted

## 2015-02-22 NOTE — Telephone Encounter (Signed)
Pt is returning our call about echo results.  Please call when ready

## 2015-02-22 NOTE — Telephone Encounter (Signed)
Reviewed results of ECHO in detail w/ pt.  She is appreciative and will call back w/ any further questions or concerns.

## 2015-02-28 ENCOUNTER — Ambulatory Visit (INDEPENDENT_AMBULATORY_CARE_PROVIDER_SITE_OTHER): Payer: Medicare Other

## 2015-02-28 DIAGNOSIS — I4891 Unspecified atrial fibrillation: Secondary | ICD-10-CM

## 2015-02-28 DIAGNOSIS — Z5181 Encounter for therapeutic drug level monitoring: Secondary | ICD-10-CM

## 2015-02-28 DIAGNOSIS — Z953 Presence of xenogenic heart valve: Secondary | ICD-10-CM

## 2015-02-28 LAB — POCT INR: INR: 2.8

## 2015-03-05 ENCOUNTER — Encounter: Payer: Self-pay | Admitting: *Deleted

## 2015-03-07 ENCOUNTER — Encounter: Payer: Self-pay | Admitting: *Deleted

## 2015-03-07 DIAGNOSIS — H401233 Low-tension glaucoma, bilateral, severe stage: Secondary | ICD-10-CM | POA: Diagnosis not present

## 2015-03-12 ENCOUNTER — Encounter: Payer: Self-pay | Admitting: Internal Medicine

## 2015-03-15 ENCOUNTER — Ambulatory Visit (INDEPENDENT_AMBULATORY_CARE_PROVIDER_SITE_OTHER): Payer: Medicare Other | Admitting: Cardiovascular Disease

## 2015-03-15 ENCOUNTER — Encounter: Payer: Self-pay | Admitting: Cardiovascular Disease

## 2015-03-15 VITALS — BP 100/72 | HR 75 | Ht 63.0 in | Wt 152.0 lb

## 2015-03-15 DIAGNOSIS — I10 Essential (primary) hypertension: Secondary | ICD-10-CM

## 2015-03-15 DIAGNOSIS — Z953 Presence of xenogenic heart valve: Secondary | ICD-10-CM | POA: Diagnosis not present

## 2015-03-15 DIAGNOSIS — I639 Cerebral infarction, unspecified: Secondary | ICD-10-CM

## 2015-03-15 DIAGNOSIS — I4891 Unspecified atrial fibrillation: Secondary | ICD-10-CM | POA: Diagnosis not present

## 2015-03-15 DIAGNOSIS — I5022 Chronic systolic (congestive) heart failure: Secondary | ICD-10-CM | POA: Diagnosis not present

## 2015-03-15 NOTE — Patient Instructions (Signed)
Medication Instructions:  Your physician recommends that you continue on your current medications as directed. Please refer to the Current Medication list given to you today.   Labwork: Your physician recommends that you have labs today: BMET   Testing/Procedures: none  Follow-Up: Your physician wants you to follow-up in: six months with Dr. Arida.  You will receive a reminder letter in the mail two months in advance. If you don't receive a letter, please call our office to schedule the follow-up appointment.   Any Other Special Instructions Will Be Listed Below (If Applicable).   

## 2015-03-15 NOTE — Progress Notes (Signed)
HPI  This is a 74 year old female who is here today for a follow-up visit. She has known history of chronic systolic heart failure due to nonischemic cardiomyopathy and possibly valvular heart disease. She used to be followed by [facility] of [place]. Congestive heart failure was diagnosed in 2007. Most recent ejection fraction was 40% in September 2015. She has known history of atrial fibrillation. She underwent AV nodal ablation and pacemaker placement in November 2013. 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 Her ejection fraction deteriorated and she underwent explantation of the previous pacemaker and implantation of biventricular ICD in March 2015. She reports possible CVA in the past when her warfarin was held for a procedure. Since then, she has been bridged with low molecular weight heparin. She reports that her recommended INR has been between 2.5-3. She has been doing reasonably well and denies chest pain . During last visit, I decreased the dose of Bumex to once daily and added spironolactone 25 mg once daily.   Allergies  Allergen Reactions  . Amiodarone     "Irritable"   . Phytonadione [Vitamin K1]     "extremely faint"   . Biaxin [Clarithromycin] Rash    Rash while on biaxin, cipro, flagyl (unsure which was inciting agent)  . Ciprofloxacin Rash    Rash while on biaxin, cipro, flagyl (unsure which was inciting agent)  . Flagyl [Metronidazole] Rash    Rash while on biaxin, cipro, flagyl (unsure which was inciting agent)  . Lisinopril Rash     Current Outpatient Prescriptions on File Prior to Visit  Medication Sig Dispense Refill  . aspirin 81 MG tablet Take 81 mg by mouth daily.    . bimatoprost (LUMIGAN) 0.03 % ophthalmic solution Place 1 drop into both eyes at bedtime.    . bumetanide (BUMEX) 2 MG tablet Take  1 tablet (2 mg total) by mouth daily. 30 tablet 5  . carvedilol (COREG) 12.5 MG tablet Take 12.5 mg by mouth 2 (two) times daily with a meal.    . DiphenhydrAMINE HCl (BENADRYL ALLERGY PO) Take by mouth at bedtime.    [name] estradiol (ESTRACE) 1 MG tablet Take 0.5 mg by mouth daily.    [name] losartan (COZAAR) 25 MG tablet Take 25 mg by mouth daily.    [name] MAGNESIUM PO Take by mouth daily.    . potassium chloride SA (K-DUR,KLOR-CON) 20 MEQ tablet Take 1 tablet (20 mEq total) by mouth daily. 90 tablet 3  . spironolactone (ALDACTONE) 25 MG tablet Take 1 tablet (25 mg total) by mouth daily. 30 tablet 5  . sulfamethoxazole-trimethoprim (BACTRIM DS,SEPTRA DS) 800-160 MG per tablet Take 1 tablet by mouth 2 (two) times daily. 10 tablet 0  . VITAMIN E PO Take by mouth daily.    . WARFARIN SODIUM PO Take 3 mg by mouth as directed.      No current facility-administered medications on file prior to visit.     Past Medical History  Diagnosis Date  . 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 Systolic CHF     EF 08% s/p MVR and TVA 2013  . Atrial fibrillation   . H. pylori infection     treated  . CKD (chronic kidney disease) stage 3, GFR 30-59 ml/min     prior saw nephrologist thought hypertensive nephropathy/renovascular disease  . Hypertension   . Melanoma 2006  L shoulder  . Phlebitis   . 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 CAD (coronary artery disease) 03/09/2000    s/p MI 2001  . Glaucoma   . Bleeding of eye   . Hx of rheumatic fever   . Chronic systolic heart failure   . CVA (cerebral infarction) 2014    occipital lobe - some vision loss  . Dyslipidemia      Past Surgical History  Procedure Laterality Date  . Defibrillator      MEDTRONIC 5086 MRI 52 CM LEAD, SERIAL # [id number]  . Ablation  2013  . Mitral valve replacement  05/2012  . Tricuspid  valve replacement  05/2012  . Total abdominal hysterectomy w/ bilateral salpingoophorectomy  1991    irregular bleeding  . Gallbladder surgery  2006  . Cataract extraction Bilateral 2007, 2010  . Insert / replace / remove pacemaker  08/2013  . US echocardiography  02/2014    EF 40%, LA marked dilation, gen LV hypokinesis, mitral prosthetic valve  . Colonoscopy  03/2014    diverticulosis, int hem, rpt 5 yrs for h/o polyps ([place])  . Dg abdomen complete ([facility] hx)  02/2013    HH, mod severe erosive gastritis, duodenitis     Family History  Problem Relation Age of Onset  . CAD Father 67    MI  . Hypertension Brother   . Heart failure Brother     CHF with pacemaker  . Stroke Brother   . Heart failure Sister     CHF  . Stroke Sister   . Diabetes Cousin   . Cancer Maternal Aunt     colon  . Cancer Cousin     breast  . Cancer Cousin     brain tumor     Social History   Social History  . Marital Status: Married    Spouse Name: N/A  . Number of Children: N/A  . Years of Education: N/A   Occupational History  . Not on file.   Social History Main Topics  . Smoking status: Former Smoker    Types: Cigarettes  . Smokeless tobacco: Never Used  . Alcohol Use: 0.0 oz/week    0 Standard drinks or equivalent per week     Comment: occasional  . Drug Use: No  . Sexual Activity: Not Currently    Birth Control/ Protection: None   Other Topics Concern  . 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 It is negative other than that mentioned in the history of present illness.   PHYSICAL EXAM   BP 100/72 mmHg  Pulse 75  Ht 5\' 3"  (1.6 m)  Wt 152 lb (68.947 kg)  BMI 26.93 kg/m2 Constitutional: She is oriented to person, place, and  time. She appears well-developed and well-nourished. No distress.  HENT: No nasal discharge.  Head: Normocephalic and atraumatic.  Eyes: Pupils are equal and round. No discharge.  Neck: Normal range of motion. Neck supple. No JVD present. No thyromegaly present.  Cardiovascular: Normal rate, regular rhythm, normal heart sounds. Exam  reveals no gallop and no friction rub. No murmur heard.  Pulmonary/Chest: Effort normal and breath sounds normal. No stridor. No respiratory distress. She has no wheezes. She has no rales. She exhibits no tenderness.  Abdominal: Soft. Bowel sounds are normal. She exhibits no distension. There is no tenderness. There is no rebound and no guarding.  Musculoskeletal: Normal range of motion. She exhibits no edema and no tenderness.  Neurological: She is alert and oriented to person, place, and time. Coordination normal.  Skin: Skin is warm and dry. No rash noted. She is not diaphoretic. No erythema. No pallor.  Psychiatric: She has a normal mood and affect. Her behavior is normal. Judgment and thought content normal.     EKG: Sinus rhythm with ventricular paced rhythm.  ASSESSMENT AND PLAN

## 2015-03-16 LAB — BASIC METABOLIC PANEL
BUN / CREAT RATIO: 22 (ref 11–26)
BUN: 31 mg/dL — AB (ref 8–27)
CHLORIDE: 102 mmol/L (ref 97–108)
CO2: 22 mmol/L (ref 18–29)
Calcium: 10.2 mg/dL (ref 8.7–10.3)
Creatinine, Ser: 1.38 mg/dL — ABNORMAL HIGH (ref 0.57–1.00)
GFR calc Af Amer: 44 mL/min/{1.73_m2} — ABNORMAL LOW (ref >59)
GFR calc non Af Amer: 38 mL/min/{1.73_m2} — ABNORMAL LOW (ref >59)
GLUCOSE: 98 mg/dL (ref 65–99)
Potassium: 6.1 mmol/L — ABNORMAL HIGH (ref 3.5–5.2)
SODIUM: 142 mmol/L (ref 134–144)

## 2015-03-18 NOTE — Assessment & Plan Note (Signed)
This was functioning normally her most recent echocardiogram.

## 2015-03-18 NOTE — Assessment & Plan Note (Signed)
She is doing reasonably well with chronic exertional dyspnea. She is currently in Bri[place] class II.  Most recent echo was 35-40% in August. I requested a repeat basic metabolic profile given the addition of spironolactone. We might be able to stop her potassium supplement.

## 2015-03-18 NOTE — Assessment & Plan Note (Signed)
Continue [place]-term anticoagulation with warfarin. She is in sinus rhythm.

## 2015-03-18 NOTE — Assessment & Plan Note (Signed)
Blood pressure is controlled on current medications. 

## 2015-03-19 ENCOUNTER — Other Ambulatory Visit: Payer: Self-pay

## 2015-03-19 DIAGNOSIS — N189 Chronic kidney disease, unspecified: Secondary | ICD-10-CM

## 2015-03-21 ENCOUNTER — Ambulatory Visit (INDEPENDENT_AMBULATORY_CARE_PROVIDER_SITE_OTHER): Payer: Medicare Other

## 2015-03-21 DIAGNOSIS — Z23 Encounter for immunization: Secondary | ICD-10-CM | POA: Diagnosis not present

## 2015-03-23 ENCOUNTER — Other Ambulatory Visit (INDEPENDENT_AMBULATORY_CARE_PROVIDER_SITE_OTHER): Payer: Medicare Other | Admitting: *Deleted

## 2015-03-23 DIAGNOSIS — N189 Chronic kidney disease, unspecified: Secondary | ICD-10-CM | POA: Diagnosis not present

## 2015-03-24 LAB — BASIC METABOLIC PANEL
BUN/Creatinine Ratio: 15 (ref 11–26)
BUN: 22 mg/dL (ref 8–27)
CO2: 23 mmol/L (ref 18–29)
Calcium: 9.3 mg/dL (ref 8.7–10.3)
Chloride: 101 mmol/L (ref 97–108)
Creatinine, Ser: 1.42 mg/dL — ABNORMAL HIGH (ref 0.57–1.00)
GFR, EST AFRICAN AMERICAN: 43 mL/min/{1.73_m2} — AB (ref >59)
GFR, EST NON AFRICAN AMERICAN: 37 mL/min/{1.73_m2} — AB (ref >59)
Glucose: 93 mg/dL (ref 65–99)
POTASSIUM: 4.1 mmol/L (ref 3.5–5.2)
SODIUM: 141 mmol/L (ref 134–144)

## 2015-04-11 ENCOUNTER — Ambulatory Visit (INDEPENDENT_AMBULATORY_CARE_PROVIDER_SITE_OTHER): Payer: Medicare Other

## 2015-04-11 DIAGNOSIS — Z953 Presence of xenogenic heart valve: Secondary | ICD-10-CM | POA: Diagnosis not present

## 2015-04-11 DIAGNOSIS — I4891 Unspecified atrial fibrillation: Secondary | ICD-10-CM

## 2015-04-11 DIAGNOSIS — Z5181 Encounter for therapeutic drug level monitoring: Secondary | ICD-10-CM | POA: Diagnosis not present

## 2015-04-11 LAB — POCT INR: INR: 2.2

## 2015-04-12 ENCOUNTER — Ambulatory Visit (INDEPENDENT_AMBULATORY_CARE_PROVIDER_SITE_OTHER): Payer: Medicare Other | Admitting: *Deleted

## 2015-04-12 DIAGNOSIS — I4891 Unspecified atrial fibrillation: Secondary | ICD-10-CM

## 2015-04-12 LAB — CUP PACEART REMOTE DEVICE CHECK
Battery Voltage: 2.91 V
Date Time Interrogation Session: 20161020105803
HighPow Impedance: 57 Ohm
Implantable Lead Implant Date: 20150316
Lead Channel Pacing Threshold Amplitude: 0.8 V
Lead Channel Pacing Threshold Pulse Width: 0.4 ms
Lead Channel Setting Pacing Amplitude: 2.5 V
Lead Channel Setting Pacing Pulse Width: 0.4 ms
MDC IDC LEAD IMPLANT DT: 20150316
MDC IDC LEAD LOCATION: 753858
MDC IDC LEAD LOCATION: 753860
MDC IDC LEAD SERIAL: 25130583
MDC IDC MSMT LEADCHNL LV IMPEDANCE VALUE: 579 Ohm
MDC IDC MSMT LEADCHNL RV IMPEDANCE VALUE: 477 Ohm
MDC IDC SET LEADCHNL LV PACING PULSEWIDTH: 0.4 ms
MDC IDC SET LEADCHNL RV PACING AMPLITUDE: 2 V
MDC IDC STAT BRADY RV PERCENT PACED: 98 %
Pulse Gen Model: 383547
Pulse Gen Serial Number: 60765179

## 2015-04-12 NOTE — Progress Notes (Signed)
Remote ICD transmission.   

## 2015-04-16 ENCOUNTER — Encounter: Payer: Self-pay | Admitting: Cardiology

## 2015-04-25 ENCOUNTER — Other Ambulatory Visit: Payer: Self-pay | Admitting: *Deleted

## 2015-04-25 ENCOUNTER — Inpatient Hospital Stay
Admission: RE | Admit: 2015-04-25 | Discharge: 2015-04-25 | Disposition: A | Discharge status: Home / Self Care | Payer: Self-pay | Source: Ambulatory Visit | Attending: *Deleted | Admitting: *Deleted

## 2015-04-25 DIAGNOSIS — Z9289 Personal history of other medical treatment: Secondary | ICD-10-CM

## 2015-04-30 ENCOUNTER — Encounter: Payer: Self-pay | Admitting: Internal Medicine

## 2015-04-30 ENCOUNTER — Ambulatory Visit (INDEPENDENT_AMBULATORY_CARE_PROVIDER_SITE_OTHER): Payer: Medicare Other | Admitting: Internal Medicine

## 2015-04-30 VITALS — BP 106/68 | HR 76 | Temp 97.5°F | Wt 150.0 lb

## 2015-04-30 DIAGNOSIS — J069 Acute upper respiratory infection, unspecified: Secondary | ICD-10-CM | POA: Diagnosis not present

## 2015-04-30 DIAGNOSIS — I639 Cerebral infarction, unspecified: Secondary | ICD-10-CM | POA: Diagnosis not present

## 2015-04-30 DIAGNOSIS — B9789 Other viral agents as the cause of diseases classified elsewhere: Principal | ICD-10-CM

## 2015-04-30 NOTE — Progress Notes (Signed)
HPI  Pt presents to the clinic today with c/o headache, fatigue, runny nose, scratchy throat and cough. This started 3 days ago. She is blowing yellow mucous out of her nose. The cough is productive of blood tinged yellow mucous as well. She denies shortness of breath. She denies fever chills or body aches. She has no history of seasonal allergies. She has had contacts with people who have similar symptoms.  Review of Systems      Past Medical History  Diagnosis Date  . 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 Systolic CHF ([place])     EF 40% s/p MVR and TVA 2013  . Atrial fibrillation ([place])   . H. pylori infection     treated  . CKD (chronic kidney disease) stage 3, GFR 30-59 ml/min     prior saw nephrologist thought hypertensive nephropathy/renovascular disease  . Hypertension   . Melanoma ([place]) 2006    L shoulder  . Phlebitis   . Thromboembolism of upper extremity artery ([place]) 06/2012    h/o acute ischemia due to thromboembolism radial and ulnar arteries when coumadin held - needs bridging if off coumadin  . CAD (coronary artery disease) 03/09/2000    s/p MI 2001  . Glaucoma   . Bleeding of eye   . Hx of rheumatic fever   . Chronic systolic heart failure ([place])   . CVA (cerebral infarction) 2014    occipital lobe - some vision loss  . Dyslipidemia     Family History  Problem Relation Age of Onset  . CAD Father 43    MI  . Hypertension Brother   . Heart failure Brother     CHF with pacemaker  . Stroke Brother   . Heart failure Sister     CHF  . Stroke Sister   . Diabetes Cousin   . Cancer Maternal Aunt     colon  . Cancer Cousin     breast  . Cancer Cousin     brain tumor    Social History   Social History  . Marital Status: Married    Spouse Name: N/A  . Number of Children: N/A  . Years of Education: N/A   Occupational History  . Not on file.   Social History Main Topics  . Smoking status: Former Smoker    Types: Cigarettes  . Smokeless tobacco: Never  Used  . Alcohol Use: 0.0 oz/week    0 Standard drinks or equivalent per week     Comment: occasional  . Drug Use: No  . Sexual Activity: Not Currently    Birth Control/ Protection: None   Other Topics Concern  . 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 Amiodarone     "Irritable"   . Phytonadione [Vitamin K1]     "extremely faint"   . Biaxin [Clarithromycin] Rash    Rash while on biaxin, cipro, flagyl (unsure which was inciting agent)  . Ciprofloxacin Rash    Rash while on biaxin, cipro, flagyl (unsure which was inciting agent)  . Flagyl [Metronidazole] Rash    Rash while on biaxin, cipro, flagyl (unsure which was inciting agent)  . Lisinopril Rash     Constitutional: Positive headache, fatigue.  Denies fever or abrupt weight changes.  HEENT:  Positive runny nose, sore throat. Denies eye redness, eye pain, pressure behind the eyes, facial pain, nasal congestion, ear pain, ringing in the ears, wax buildup, or bloody nose. Respiratory: Positive cough. Denies difficulty breathing or shortness of breath.  Cardiovascular: Denies chest pain, chest tightness, palpitations or swelling in the hands or feet.   No other specific complaints in a complete review of systems (except as listed in HPI above).  Objective:   BP 106/68 mmHg  Pulse 76  Temp(Src) 97.5 F (36.4 C) (Oral)  Wt 150 lb (68.04 kg)  SpO2 98%  LMP  (LMP Unknown) Wt Readings from Last 3 Encounters:  04/30/15 150 lb (68.04 kg)  03/15/15 152 lb (68.947 kg)  01/30/15 149 lb 3.2 oz (67.677 kg)     General: Appears her stated age,  in NAD. HEENT: Head: normal shape and size, no sinus tenderness noted; Eyes: sclera white, no icterus, conjunctiva pink; Left Ears: Tm's gray and intact,  normal light reflex; Right Ear: cerumen impaction on the right. Nose: mucosa pink and moist, septum midline; Throat/Mouth: Teeth present, mucosa erythematous and moist, no exudate noted, no lesions or ulcerations noted.  Neck: No cervical lymphadenopathy.  Cardiovascular: Normal rate and rhythm. S1,S2 noted.  No murmur, rubs or gallops noted.  Pulmonary/Chest: Normal effort and positive vesicular breath sounds. No respiratory distress. No wheezes, rales or ronchi noted.      Assessment & Plan:   Upper Respiratory Infection:  Viral Get some rest and drink plenty of water Do salt water gargles for the sore throat Start Mucinex and Delxym OTC If symptoms persist, will consider sending in a zpack  RTC as needed or if symptoms persist.

## 2015-04-30 NOTE — Progress Notes (Signed)
Pre visit review using our clinic review tool, if applicable. No additional management support is needed unless otherwise documented below in the visit note. 

## 2015-04-30 NOTE — Patient Instructions (Signed)
Upper Respiratory Infection, Adult Most upper respiratory infections (URIs) are a viral infection of the air passages leading to the lungs. A URI affects the nose, throat, and upper air passages. The most common type of URI is nasopharyngitis and is typically referred to as "the common cold." URIs run their course and usually go away on their own. Most of the time, a URI does not require medical attention, but sometimes a bacterial infection in the upper airways can follow a viral infection. This is called a secondary infection. Sinus and middle ear infections are common types of secondary upper respiratory infections. Bacterial pneumonia can also complicate a URI. A URI can worsen asthma and chronic obstructive pulmonary disease (COPD). Sometimes, these complications can require emergency medical care and may be life threatening.  CAUSES Almost all URIs are caused by viruses. A virus is a type of germ and can spread from one person to another.  RISKS FACTORS You may be at risk for a URI if:   You smoke.   You have chronic heart or lung disease.  You have a weakened defense (immune) system.   You are very young or very old.   You have nasal allergies or asthma.  You work in crowded or poorly ventilated areas.  You work in health care facilities or schools. SIGNS AND SYMPTOMS  Symptoms typically develop 2-3 days after you come in contact with a cold virus. Most viral URIs last 7-10 days. However, viral URIs from the influenza virus (flu virus) can last 14-18 days and are typically more severe. Symptoms may include:   Runny or stuffy (congested) nose.   Sneezing.   Cough.   Sore throat.   Headache.   Fatigue.   Fever.   Loss of appetite.   Pain in your forehead, behind your eyes, and over your cheekbones (sinus pain).  Muscle aches.  DIAGNOSIS  Your health care provider may diagnose a URI by:  Physical exam.  Tests to check that your symptoms are not due to  another condition such as:  Strep throat.  Sinusitis.  Pneumonia.  Asthma. TREATMENT  A URI goes away on its own with time. It cannot be cured with medicines, but medicines may be prescribed or recommended to relieve symptoms. Medicines may help:  Reduce your fever.  Reduce your cough.  Relieve nasal congestion. HOME CARE INSTRUCTIONS   Take medicines only as directed by your health care provider.   Gargle warm saltwater or take cough drops to comfort your throat as directed by your health care provider.  Use a warm mist humidifier or inhale steam from a shower to increase air moisture. This may make it easier to breathe.  Drink enough fluid to keep your urine clear or pale yellow.   Eat soups and other clear broths and maintain good nutrition.   Rest as needed.   Return to work when your temperature has returned to normal or as your health care provider advises. You may need to stay home longer to avoid infecting others. You can also use a face mask and careful hand washing to prevent spread of the virus.  Increase the usage of your inhaler if you have asthma.   Do not use any tobacco products, including cigarettes, chewing tobacco, or electronic cigarettes. If you need help quitting, ask your health care provider. PREVENTION  The best way to protect yourself from getting a cold is to practice good hygiene.   Avoid oral or hand contact with people with cold   symptoms.   Wash your hands often if contact occurs.  There is no clear evidence that vitamin C, vitamin E, echinacea, or exercise reduces the chance of developing a cold. However, it is always recommended to get plenty of rest, exercise, and practice good nutrition.  SEEK MEDICAL CARE IF:   You are getting worse rather than better.   Your symptoms are not controlled by medicine.   You have chills.  You have worsening shortness of breath.  You have brown or red mucus.  You have yellow or brown nasal  discharge.  You have pain in your face, especially when you bend forward.  You have a fever.  You have swollen neck glands.  You have pain while swallowing.  You have white areas in the back of your throat. SEEK IMMEDIATE MEDICAL CARE IF:   You have severe or persistent:  Headache.  Ear pain.  Sinus pain.  Chest pain.  You have chronic lung disease and any of the following:  Wheezing.  Prolonged cough.  Coughing up blood.  A change in your usual mucus.  You have a stiff neck.  You have changes in your:  Vision.  Hearing.  Thinking.  Mood. MAKE SURE YOU:   Understand these instructions.  Will watch your condition.  Will get help right away if you are not doing well or get worse.   This information is not intended to replace advice given to you by your health care provider. Make sure you discuss any questions you have with your health care provider.   Document Released: 12/03/2000 Document Revised: 10/24/2014 Document Reviewed: 09/14/2013 Elsevier Interactive Patient Education 2016 [employer]vier Inc.  

## 2015-05-03 ENCOUNTER — Other Ambulatory Visit: Payer: Self-pay | Admitting: Internal Medicine

## 2015-05-03 ENCOUNTER — Telehealth: Payer: Self-pay | Admitting: Family Medicine

## 2015-05-03 MED ORDER — AZITHROMYCIN 250 MG PO TABS: ORAL_TABLET | ORAL | Status: DC

## 2015-05-03 NOTE — Telephone Encounter (Signed)
Pt called in, she saw regina on Monday, she is not feeling better,she is requesting the rx that you talked about.   [facility] in [place] ([address])  cb number is [phone] Thank you

## 2015-05-03 NOTE — Telephone Encounter (Signed)
z pack sent to pharmacy

## 2015-05-03 NOTE — Telephone Encounter (Signed)
Rx called in to pharmacy. 

## 2015-05-03 NOTE — Telephone Encounter (Signed)
Pt is aware.  

## 2015-05-07 ENCOUNTER — Telehealth: Payer: Self-pay

## 2015-05-07 NOTE — Telephone Encounter (Signed)
Pt states she can't come in office tomorrow, thus discussed with Elizabeth Hall, Pharm D, will continue same dosage and keep her next scheduled appt. Pt denies any bleeding or bruising problems or GI upset.

## 2015-05-07 NOTE — Telephone Encounter (Signed)
Pt states she started a Z pak on 11/10. States she finished it today. She has an coumadin appt on 11/30. Not sure what she should do. Please call and advise.

## 2015-05-23 ENCOUNTER — Ambulatory Visit (INDEPENDENT_AMBULATORY_CARE_PROVIDER_SITE_OTHER): Payer: Medicare Other | Admitting: *Deleted

## 2015-05-23 DIAGNOSIS — Z5181 Encounter for therapeutic drug level monitoring: Secondary | ICD-10-CM | POA: Diagnosis not present

## 2015-05-23 DIAGNOSIS — I4891 Unspecified atrial fibrillation: Secondary | ICD-10-CM | POA: Diagnosis not present

## 2015-05-23 DIAGNOSIS — Z953 Presence of xenogenic heart valve: Secondary | ICD-10-CM

## 2015-05-23 LAB — POCT INR: INR: 2.3

## 2015-06-05 ENCOUNTER — Telehealth: Payer: Self-pay | Admitting: *Deleted

## 2015-06-05 ENCOUNTER — Other Ambulatory Visit: Payer: Self-pay | Admitting: Family Medicine

## 2015-06-05 DIAGNOSIS — R922 Inconclusive mammogram: Secondary | ICD-10-CM

## 2015-06-05 DIAGNOSIS — Z9889 Other specified postprocedural states: Secondary | ICD-10-CM

## 2015-06-05 NOTE — Telephone Encounter (Signed)
Spoke to pt about mammogram last year, pt gets diagnostic mammogram and [place] due to dense breast and history of biopsy. Will schedule Diagnostic bilateral mammogram,  [place] would be performed as well with this order per [facility].  Will call pt with appt date and time.

## 2015-06-12 ENCOUNTER — Ambulatory Visit
Admission: RE | Admit: 2015-06-12 | Discharge: 2015-06-12 | Disposition: A | Discharge status: Home / Self Care | Payer: Medicare Other | Source: Ambulatory Visit | Attending: Family Medicine | Admitting: Family Medicine

## 2015-06-12 DIAGNOSIS — Z9889 Other specified postprocedural states: Secondary | ICD-10-CM

## 2015-06-12 DIAGNOSIS — R922 Inconclusive mammogram: Secondary | ICD-10-CM

## 2015-06-12 DIAGNOSIS — R928 Other abnormal and inconclusive findings on diagnostic imaging of breast: Secondary | ICD-10-CM | POA: Diagnosis not present

## 2015-06-13 ENCOUNTER — Ambulatory Visit (INDEPENDENT_AMBULATORY_CARE_PROVIDER_SITE_OTHER): Payer: Medicare Other

## 2015-06-13 DIAGNOSIS — Z953 Presence of xenogenic heart valve: Secondary | ICD-10-CM | POA: Diagnosis not present

## 2015-06-13 DIAGNOSIS — I4891 Unspecified atrial fibrillation: Secondary | ICD-10-CM

## 2015-06-13 DIAGNOSIS — Z5181 Encounter for therapeutic drug level monitoring: Secondary | ICD-10-CM | POA: Diagnosis not present

## 2015-06-13 LAB — POCT INR: INR: 2.9

## 2015-06-25 ENCOUNTER — Other Ambulatory Visit: Payer: Self-pay | Admitting: Cardiovascular Disease

## 2015-06-25 ENCOUNTER — Other Ambulatory Visit: Payer: Self-pay | Admitting: Family Medicine

## 2015-06-25 DIAGNOSIS — R7303 Prediabetes: Secondary | ICD-10-CM

## 2015-06-25 DIAGNOSIS — E785 Hyperlipidemia, unspecified: Secondary | ICD-10-CM

## 2015-06-25 DIAGNOSIS — I10 Essential (primary) hypertension: Secondary | ICD-10-CM

## 2015-06-25 DIAGNOSIS — N189 Chronic kidney disease, unspecified: Secondary | ICD-10-CM

## 2015-06-26 ENCOUNTER — Telehealth: Payer: Self-pay | Admitting: Family Medicine

## 2015-06-26 NOTE — Telephone Encounter (Signed)
Patient Name: Elizabeth Hall  DOB: 03/04/1941    Initial Comment Caller states has appt tomorrow morning for labs; has upper respiratory issue cough; chest tightness-not pain; when comes in for lab can someone see her; has an artificial heart valve; **previous [employer] employee;    Nurse Assessment  Nurse: Mallie Mussel, RN, Alveta Heimlich Date/Time (Eastern Time): 06/26/2015 9:34:48 AM  Confirm and document reason for call. If symptomatic, describe symptoms. ---Caller states that she has some SOB with exertion, "not anything I can't deal with." She feels some tightness in her chest due to respiratory issues. She has an appointment for labs tomorrow. Wants to be know if she can be seen at that time also. Her appointment for labs is 8:35AM. Has coughed up some mucus, but no blood or rusty colored sputum. Denies fever.  Has the patient traveled out of the country within the last 30 days? ---No  Does the patient have any new or worsening symptoms? ---Yes  Will a triage be completed? ---Yes  Related visit to physician within the last 2 weeks? ---No  Does the PT have any chronic conditions? (i.e. diabetes, asthma, etc.) ---Yes  List chronic conditions. ---HTN, A Fib, Pacemaker/Defibrillator, Artificial Heart Valve  Is this a behavioral health or substance abuse call? ---No     Guidelines    Guideline Title Affirmed Question Affirmed Notes  Common Cold Cold with no complications (all triage questions negative)    Final Disposition User   See Physician within 24 Hours Mallie Mussel, RN, Alveta Heimlich    Comments  Appointment scheduled for 06/27/15 at 9:30AM with Alma Friendly, NP   Referrals  REFERRED TO PCP OFFICE   Disagree/Comply: Comply

## 2015-06-26 NOTE — Telephone Encounter (Signed)
Pt has appt 06/27/15 at 9:30 with Allie Bossier NP.

## 2015-06-27 ENCOUNTER — Encounter: Payer: Self-pay | Admitting: Primary Care

## 2015-06-27 ENCOUNTER — Ambulatory Visit (INDEPENDENT_AMBULATORY_CARE_PROVIDER_SITE_OTHER): Payer: Medicare Other | Admitting: Primary Care

## 2015-06-27 ENCOUNTER — Other Ambulatory Visit (INDEPENDENT_AMBULATORY_CARE_PROVIDER_SITE_OTHER): Payer: Medicare Other

## 2015-06-27 VITALS — BP 132/70 | HR 87 | Temp 98.6°F | Ht 63.0 in | Wt 151.8 lb

## 2015-06-27 DIAGNOSIS — I10 Essential (primary) hypertension: Secondary | ICD-10-CM | POA: Diagnosis not present

## 2015-06-27 DIAGNOSIS — N189 Chronic kidney disease, unspecified: Secondary | ICD-10-CM | POA: Diagnosis not present

## 2015-06-27 DIAGNOSIS — E785 Hyperlipidemia, unspecified: Secondary | ICD-10-CM

## 2015-06-27 DIAGNOSIS — R0989 Other specified symptoms and signs involving the circulatory and respiratory systems: Secondary | ICD-10-CM

## 2015-06-27 DIAGNOSIS — R7303 Prediabetes: Secondary | ICD-10-CM | POA: Diagnosis not present

## 2015-06-27 LAB — RENAL FUNCTION PANEL
Albumin: 4.1 g/dL (ref 3.5–5.2)
BUN: 24 mg/dL — ABNORMAL HIGH (ref 6–23)
CALCIUM: 9.8 mg/dL (ref 8.4–10.5)
CO2: 28 meq/L (ref 19–32)
Chloride: 104 mEq/L (ref 96–112)
Creatinine, Ser: 1.23 mg/dL — ABNORMAL HIGH (ref 0.40–1.20)
GFR: 45.48 mL/min — AB (ref >60.00)
GLUCOSE: 105 mg/dL — AB (ref 70–99)
PHOSPHORUS: 3.1 mg/dL (ref 2.3–4.6)
POTASSIUM: 4.1 meq/L (ref 3.5–5.1)
Sodium: 140 mEq/L (ref 135–145)

## 2015-06-27 LAB — LIPID PANEL
CHOLESTEROL: 160 mg/dL (ref 0–200)
HDL: 51.6 mg/dL (ref >39.00)
LDL Cholesterol: 96 mg/dL (ref 0–99)
NonHDL: 108.51
Total CHOL/HDL Ratio: 3
Triglycerides: 62 mg/dL (ref 0.0–149.0)
VLDL: 12.4 mg/dL (ref 0.0–40.0)

## 2015-06-27 LAB — HEMOGLOBIN A1C: HEMOGLOBIN A1C: 6.2 % (ref 4.6–6.5)

## 2015-06-27 NOTE — Progress Notes (Signed)
Subjective:    Patient ID: Elizabeth Hall, female    DOB: Mar 04, 1941, 75 y.o.   MRN: [id number]  HPI  Elizabeth Hall is a 75 year old female who presents today with a chief complaint of cough. She has a history of CHF, mitral valve replacement, prior tobacco abuse. She also reports chest chest congestion (green mucous), scratchy throat, nasal congestion. Her symptoms began Monday night this week (2 days ago). She's taken Zyrtec and tylenol without improvement. Her most bothersome symptom is her chest congestion. Denies fevers, sick contacts.  Review of Systems  Constitutional: Negative for fever and chills.  HENT: Positive for congestion and sinus pressure. Negative for ear pain and sore throat.   Respiratory: Positive for cough.        Past Medical History  Diagnosis Date  . 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 Systolic CHF ([place])     EF 40% s/p MVR and TVA 2013  . Atrial fibrillation ([place])   . H. pylori infection     treated  . CKD (chronic kidney disease) stage 3, GFR 30-59 ml/min     prior saw nephrologist thought hypertensive nephropathy/renovascular disease  . Hypertension   . Melanoma ([place]) 2006    L shoulder  . Phlebitis   . 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 CAD (coronary artery disease) 03/09/2000    s/p MI 2001  . Glaucoma   . Bleeding of eye   . Hx of rheumatic fever   . Chronic systolic heart failure ([place])   . CVA (cerebral infarction) 2014    occipital lobe - some vision loss  . Dyslipidemia     Social History   Social History  . Marital Status: Married    Spouse Name: N/A  . Number of Children: N/A  . Years of Education: N/A   Occupational History  . Not on file.   Social History Main Topics  . Smoking status: Former Smoker    Types: Cigarettes  . Smokeless tobacco: Never Used  . Alcohol Use: 0.0 oz/week    0 Standard drinks or equivalent per week     Comment: occasional  . Drug Use: No  . Sexual Activity: Not Currently    Birth Control/ Protection: None   Other Topics Concern  . 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 Defibrillator      MEDTRONIC 5086 MRI 52 CM LEAD, SERIAL # [id number]  . Ablation  2013  . Mitral valve replacement  05/2012  . 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 Total abdominal hysterectomy w/ bilateral salpingoophorectomy  1991    irregular bleeding  . Gallbladder surgery  2006  . Cataract extraction Bilateral 2007, 2010  . Insert / replace / remove pacemaker  08/2013  . US echocardiography  02/2014    EF 40%, LA marked dilation, gen LV hypokinesis, mitral prosthetic valve  . Colonoscopy  03/2014    diverticulosis, int hem, rpt 5 yrs for h/o polyps ([place])  . Dg abdomen complete ([facility] hx)  02/2013    HH,  mod severe erosive gastritis, duodenitis    Family History  Problem Relation Age of Onset  . CAD Father 98    MI  . Hypertension Brother   . Heart failure Brother     CHF with pacemaker  . Stroke Brother   . Heart failure Sister     CHF  . Stroke Sister   . Diabetes Cousin   . Cancer Maternal Aunt     colon  . Cancer Cousin     breast  . Cancer Cousin     brain tumor    Allergies  Allergen Reactions  . Amiodarone     "Irritable"   . Phytonadione [Vitamin K1]     "extremely faint"   . Biaxin [Clarithromycin] Rash    Rash while on biaxin, cipro, flagyl (unsure which was inciting agent)  . Ciprofloxacin Rash    Rash while on biaxin, cipro, flagyl (unsure which was inciting agent)  . Flagyl [Metronidazole] Rash    Rash while on biaxin, cipro, flagyl (unsure which was inciting agent)  .  Lisinopril Rash    Current Outpatient Prescriptions on File Prior to Visit  Medication Sig Dispense Refill  . aspirin 81 MG tablet Take 81 mg by mouth daily.    . bimatoprost (LUMIGAN) 0.03 % ophthalmic solution Place 1 drop into both eyes at bedtime.    . bumetanide (BUMEX) 2 MG tablet Take 1 tablet (2 mg total) by mouth daily. 30 tablet 5  . carvedilol (COREG) 12.5 MG tablet Take 12.5 mg by mouth 2 (two) times daily with a meal.    . estradiol (ESTRACE) 1 MG tablet Take 0.5 mg by mouth daily.    [name] losartan (COZAAR) 25 MG tablet Take 25 mg by mouth daily.    [name] MAGNESIUM PO Take by mouth daily.    [name] spironolactone (ALDACTONE) 25 MG tablet TAKE 1 TABLET(25 MG) BY MOUTH DAILY 30 tablet 3  . VITAMIN E PO Take by mouth daily.    . WARFARIN SODIUM PO Take 3 mg by mouth as directed.      No current facility-administered medications on file prior to visit.    BP 132/70 mmHg  Pulse 87  Temp(Src) 98.6 F (37 C) (Oral)  Ht 5\' 3"  (1.6 m)  Wt 151 lb 12 oz (68.833 kg)  BMI 26.89 kg/m2  SpO2 98%  LMP  (LMP Unknown)    Objective:   Physical Exam  Constitutional: She appears well-nourished.  HENT:  Right Ear: Tympanic membrane and ear canal normal.  Left Ear: Tympanic membrane and ear canal normal.  Nose: Right sinus exhibits maxillary sinus tenderness. Right sinus exhibits no frontal sinus tenderness. Left sinus exhibits maxillary sinus tenderness. Left sinus exhibits no frontal sinus tenderness.  Mouth/Throat: Oropharynx is clear and moist.  Eyes: Conjunctivae are normal.  Neck: Neck supple.  Cardiovascular: Normal rate and regular rhythm.   Pulmonary/Chest: Effort normal and breath sounds normal. She has no wheezes. She has no rales.  Lymphadenopathy:    She has no cervical adenopathy.  Skin: Skin is warm and dry.          Assessment & Plan:  URI/Common Cold:  Cough, chest congestion, scratchy throat x 2 days. No fevers, sick contacts. Exam with clear lungs and mostly  unremarkable HENT exam which is reassuring.  Suspect viral cold and will treat with supportive measures.  Mucinex DM, Flonase, fluids, rest. Return precautions provided. She is scheduled to see PCP Monday next week for CPE.

## 2015-06-27 NOTE — Patient Instructions (Signed)
Your symptoms are likely related to a virus, called the common cold. Follow these instructions below to help reduce your symptoms.  Cough/Chest congestion: Mucinex DM. Ensure you take this with a full glass of water.  Nasal Congestion: Flonase (fluticasone) nasal spray. Instill 2 sprays in each nostril once daily.  Increase consumption of fluids and rest.  Follow up with Dr. Danise Mina Monday next week as scheduled.  It was a pleasure meeting you!  Upper Respiratory Infection, Adult Most upper respiratory infections (URIs) are a viral infection of the air passages leading to the lungs. A URI affects the nose, throat, and upper air passages. The most common type of URI is nasopharyngitis and is typically referred to as "the common cold." URIs run their course and usually go away on their own. Most of the time, a URI does not require medical attention, but sometimes a bacterial infection in the upper airways can follow a viral infection. This is called a secondary infection. Sinus and middle ear infections are common types of secondary upper respiratory infections. Bacterial pneumonia can also complicate a URI. A URI can worsen asthma and chronic obstructive pulmonary disease (COPD). Sometimes, these complications can require emergency medical care and may be life threatening.  CAUSES Almost all URIs are caused by viruses. A virus is a type of germ and can spread from one person to another.  RISKS FACTORS You may be at risk for a URI if:   You smoke.   You have chronic heart or lung disease.  You have a weakened defense (immune) system.   You are very young or very old.   You have nasal allergies or asthma.  You work in crowded or poorly ventilated areas.  You work in health care facilities or schools. SIGNS AND SYMPTOMS  Symptoms typically develop 2-3 days after you come in contact with a cold virus. Most viral URIs last 7-10 days. However, viral URIs from the influenza virus (flu  virus) can last 14-18 days and are typically more severe. Symptoms may include:   Runny or stuffy (congested) nose.   Sneezing.   Cough.   Sore throat.   Headache.   Fatigue.   Fever.   Loss of appetite.   Pain in your forehead, behind your eyes, and over your cheekbones (sinus pain).  Muscle aches.  DIAGNOSIS  Your health care provider may diagnose a URI by:  Physical exam.  Tests to check that your symptoms are not due to another condition such as:  Strep throat.  Sinusitis.  Pneumonia.  Asthma. TREATMENT  A URI goes away on its own with time. It cannot be cured with medicines, but medicines may be prescribed or recommended to relieve symptoms. Medicines may help:  Reduce your fever.  Reduce your cough.  Relieve nasal congestion. HOME CARE INSTRUCTIONS   Take medicines only as directed by your health care provider.   Gargle warm saltwater or take cough drops to comfort your throat as directed by your health care provider.  Use a warm mist humidifier or inhale steam from a shower to increase air moisture. This may make it easier to breathe.  Drink enough fluid to keep your urine clear or pale yellow.   Eat soups and other clear broths and maintain good nutrition.   Rest as needed.   Return to work when your temperature has returned to normal or as your health care provider advises. You may need to stay home longer to avoid infecting others. You can also use  a face mask and careful hand washing to prevent spread of the virus.  Increase the usage of your inhaler if you have asthma.   Do not use any tobacco products, including cigarettes, chewing tobacco, or electronic cigarettes. If you need help quitting, ask your health care provider. PREVENTION  The best way to protect yourself from getting a cold is to practice good hygiene.   Avoid oral or hand contact with people with cold symptoms.   Wash your hands often if contact occurs.   There is no clear evidence that vitamin C, vitamin E, echinacea, or exercise reduces the chance of developing a cold. However, it is always recommended to get plenty of rest, exercise, and practice good nutrition.  SEEK MEDICAL CARE IF:   You are getting worse rather than better.   Your symptoms are not controlled by medicine.   You have chills.  You have worsening shortness of breath.  You have brown or red mucus.  You have yellow or brown nasal discharge.  You have pain in your face, especially when you bend forward.  You have a fever.  You have swollen neck glands.  You have pain while swallowing.  You have white areas in the back of your throat. SEEK IMMEDIATE MEDICAL CARE IF:   You have severe or persistent:  Headache.  Ear pain.  Sinus pain.  Chest pain.  You have chronic lung disease and any of the following:  Wheezing.  Prolonged cough.  Coughing up blood.  A change in your usual mucus.  You have a stiff neck.  You have changes in your:  Vision.  Hearing.  Thinking.  Mood. MAKE SURE YOU:   Understand these instructions.  Will watch your condition.  Will get help right away if you are not doing well or get worse.   This information is not intended to replace advice given to you by your health care provider. Make sure you discuss any questions you have with your health care provider.   Document Released: 12/03/2000 Document Revised: 10/24/2014 Document Reviewed: 09/14/2013 Elsevier Interactive Patient Education [employer].

## 2015-06-27 NOTE — Progress Notes (Signed)
Pre visit review using our clinic review tool, if applicable. No additional management support is needed unless otherwise documented below in the visit note. 

## 2015-07-02 ENCOUNTER — Ambulatory Visit (INDEPENDENT_AMBULATORY_CARE_PROVIDER_SITE_OTHER): Payer: Medicare Other | Admitting: Family Medicine

## 2015-07-02 ENCOUNTER — Encounter: Payer: Self-pay | Admitting: Family Medicine

## 2015-07-02 VITALS — BP 110/70 | HR 80 | Temp 97.6°F | Ht 63.5 in | Wt 152.2 lb

## 2015-07-02 DIAGNOSIS — Z7189 Other specified counseling: Secondary | ICD-10-CM | POA: Diagnosis not present

## 2015-07-02 DIAGNOSIS — I639 Cerebral infarction, unspecified: Secondary | ICD-10-CM

## 2015-07-02 DIAGNOSIS — Z1283 Encounter for screening for malignant neoplasm of skin: Secondary | ICD-10-CM

## 2015-07-02 DIAGNOSIS — I4891 Unspecified atrial fibrillation: Secondary | ICD-10-CM

## 2015-07-02 DIAGNOSIS — Z79890 Hormone replacement therapy (postmenopausal): Secondary | ICD-10-CM

## 2015-07-02 DIAGNOSIS — N183 Chronic kidney disease, stage 3 unspecified: Secondary | ICD-10-CM

## 2015-07-02 DIAGNOSIS — Z0000 Encounter for general adult medical examination without abnormal findings: Secondary | ICD-10-CM | POA: Diagnosis not present

## 2015-07-02 DIAGNOSIS — E785 Hyperlipidemia, unspecified: Secondary | ICD-10-CM

## 2015-07-02 DIAGNOSIS — I10 Essential (primary) hypertension: Secondary | ICD-10-CM

## 2015-07-02 DIAGNOSIS — Z85820 Personal history of malignant melanoma of skin: Secondary | ICD-10-CM | POA: Diagnosis not present

## 2015-07-02 DIAGNOSIS — Z23 Encounter for immunization: Secondary | ICD-10-CM | POA: Diagnosis not present

## 2015-07-02 DIAGNOSIS — I5022 Chronic systolic (congestive) heart failure: Secondary | ICD-10-CM

## 2015-07-02 DIAGNOSIS — H409 Unspecified glaucoma: Secondary | ICD-10-CM

## 2015-07-02 DIAGNOSIS — R7303 Prediabetes: Secondary | ICD-10-CM

## 2015-07-02 NOTE — Assessment & Plan Note (Signed)
Continued slow taper off estrogen therapy managed by OBGYN.

## 2015-07-02 NOTE — Progress Notes (Signed)
BP 110/70 mmHg  Pulse 80  Temp(Src) 97.6 F (36.4 C) (Oral)  Ht 5' 3.5" (1.613 m)  Wt 152 lb 4 oz (69.06 kg)  BMI 26.54 kg/m2  LMP  (LMP Unknown)   CC: CPE  Subjective:    Patient ID: Elizabeth Hall, female    DOB: [birth year], 75 y.o.   MRN: [id number]  HPI: Elizabeth Hall is a 75 y.o. female presenting on 07/02/2015 for Annual Exam   Atrial fibrillation s/p ablation and pacemaker 04/2012, pacer + defib 08/2013. H/o rheumatic fever age 65 yo, mitral valve repair + tricuspid band 2013. She is on coumadin. Followed by Dr Fletcher Anon and coumadin clinic at cardiologist's office. Goal INR 2.5-3. H/o eye bleed, h/o TIA when taken off coumadin without bridging.   CKD stage 3 - unsure cause.   Estrogen - longterm use. Currently taking 0.5mg  3d/wk.   Hearing screen passed Vision screen - eye exam regularly for glaucoma Denies depression/anhedonia or sadness Denies falls in past year  Preventative: COLONOSCOPY Date: 03/2014 diverticulosis, int hem, rpt 5 yrs for h/o polyps ([place]) Well woman - 07/2014. Gets every 2 years. followd by Abel Presto.  Mammogram - 05/2015 normal. DEXA - unsure - will request records today. G1P1 Yearly flu shot Tetanus - 2008 Pneumovax today, prevnar 05/2014 zostavax - undecided. Advanced directive - has at home. HCPOA is [place] husband. Asked to bring me copy. Seat belt use discussed Sunscreen use discussed. No changing moles on skin. Personal h/o melanoma.   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 Interim medical history since our last visit reviewed. Allergies and medications reviewed and updated. Current Outpatient Prescriptions on File Prior to Visit  Medication Sig  . aspirin 81 MG tablet Take 81 mg by mouth daily.  . bimatoprost  (LUMIGAN) 0.03 % ophthalmic solution Place 1 drop into both eyes at bedtime.  . bumetanide (BUMEX) 2 MG tablet Take 1 tablet (2 mg total) by mouth daily.  . carvedilol (COREG) 12.5 MG tablet Take 12.5 mg by mouth 2 (two) times daily with a meal.  . estradiol (ESTRACE) 1 MG tablet Take 0.5 mg by mouth daily.  [name] losartan (COZAAR) 25 MG tablet Take 25 mg by mouth daily.  [name] MAGNESIUM PO Take by mouth daily.  [name] spironolactone (ALDACTONE) 25 MG tablet TAKE 1 TABLET(25 MG) BY MOUTH DAILY  . VITAMIN E PO Take by mouth daily.  . WARFARIN SODIUM PO Take 3 mg by mouth as directed.    No current facility-administered medications on file prior to visit.    Review of Systems Per HPI unless specifically indicated in ROS section     Objective:    BP 110/70 mmHg  Pulse 80  Temp(Src) 97.6 F (36.4 C) (Oral)  Ht 5' 3.5" (1.613 m)  Wt 152 lb 4 oz (69.06 kg)  BMI 26.54 kg/m2  LMP  (LMP Unknown)  Wt Readings from Last 3 Encounters:  07/02/15 152 lb 4 oz (69.06 kg)  06/27/15 151 lb 12 oz (68.833 kg)  04/30/15 150 lb (68.04 kg)    Physical Exam  Constitutional: She is oriented to person, place, and time. She appears well-developed and well-nourished. No distress.  HENT:  Head: Normocephalic and atraumatic.  Right Ear: Hearing, tympanic membrane, external ear and ear canal normal.  Left Ear: Hearing, tympanic membrane,  external ear and ear canal normal.  Nose: Nose normal.  Mouth/Throat: Uvula is midline, oropharynx is clear and moist and mucous membranes are normal. No oropharyngeal exudate, posterior oropharyngeal edema or posterior oropharyngeal erythema.  Eyes: Conjunctivae and EOM are normal. Pupils are equal, round, and reactive to light. No scleral icterus.  Neck: Normal range of motion. Neck supple. Carotid bruit is not present. No thyromegaly present.  Cardiovascular: Normal rate, regular rhythm, normal heart sounds and intact distal pulses.   No murmur heard. Pulses:      Radial pulses  are 2+ on the right side, and 2+ on the left side.  Pulmonary/Chest: Effort normal and breath sounds normal. No respiratory distress. She has no wheezes. She has no rales.  Abdominal: Soft. Bowel sounds are normal. She exhibits no distension and no mass. There is no tenderness. There is no rebound and no guarding.  Musculoskeletal: Normal range of motion. She exhibits no edema.  Lymphadenopathy:    She has no cervical adenopathy.  Neurological: She is alert and oriented to person, place, and time.  CN grossly intact, station and gait intact Recall 2/3, 3/3 with cue Calculation 5/5 serial 7s  Skin: Skin is warm and dry. No rash noted.  Psychiatric: She has a normal mood and affect. Her behavior is normal. Judgment and thought content normal.  Nursing note and vitals reviewed.  Results for orders placed or performed in visit on 06/27/15  Lipid panel  Result Value Ref Range   Cholesterol 160 0 - 200 mg/dL   Triglycerides 62.0 0.0 - 149.0 mg/dL   HDL 51.60 >39.00 mg/dL   VLDL 12.4 0.0 - 40.0 mg/dL   LDL Cholesterol 96 0 - 99 mg/dL   Total CHOL/HDL Ratio 3    NonHDL 108.51   Hemoglobin A1c  Result Value Ref Range   Hgb A1c MFr Bld 6.2 4.6 - 6.5 %  Renal function panel  Result Value Ref Range   Sodium 140 135 - 145 mEq/L   Potassium 4.1 3.5 - 5.1 mEq/L   Chloride 104 96 - 112 mEq/L   CO2 28 19 - 32 mEq/L   Calcium 9.8 8.4 - 10.5 mg/dL   Albumin 4.1 3.5 - 5.2 g/dL   BUN 24 (H) 6 - 23 mg/dL   Creatinine, Ser 1.23 (H) 0.40 - 1.20 mg/dL   Glucose, Bld 105 (H) 70 - 99 mg/dL   Phosphorus 3.1 2.3 - 4.6 mg/dL   GFR 45.48 (L) >60.00 mL/min      Assessment & Plan:   Problem List Items Addressed This Visit    Prediabetes    Discussed avoiding added sugars/simple carbs      Postmenopausal HRT (hormone replacement therapy)    Continued slow taper off estrogen therapy managed by OBGYN.      Medicare annual wellness visit, subsequent - Primary    I have personally reviewed the  Medicare Annual Wellness questionnaire and have noted 1. The patient's medical and social history 2. Their use of alcohol, tobacco or illicit drugs 3. Their current medications and supplements 4. The patient's functional ability including ADL's, fall risks, home safety risks and hearing or visual impairment. Cognitive function has been assessed and addressed as indicated.  5. Diet and physical activity 6. Evidence for depression or mood disorders The patients weight, height, BMI have been recorded in the chart. I have made referrals, counseling and provided education to the patient based on review of the above and I have provided the pt with a  written personalized care plan for preventive services. Provider list updated.. See scanned questionairre as needed for further documentation. Reviewed preventative protocols and updated unless pt declined.       Hypertension    Chronic, stable. Continue current regimen.      History of melanoma   Relevant Orders   Ambulatory referral to Dermatology   Glaucoma    Regularly followed by ophtho.      Dyslipidemia    Off statin and levels normal range. If remain normal, will remove from problem list.      CVA (cerebral infarction)    Occurred while off coumadin without bridging.      CKD (chronic kidney disease) stage 3, GFR 30-59 ml/min    Stable readings. Continue to monitor. If deteriorated, low threshold to refer to nephrology - pt agrees. RTC 6 mo f/u      Chronic systolic heart failure (HCC)    Chronic, stable. Continue beta blocker, spironolactone, losartan, and aspirin. Followed by cards.       Atrial fibrillation, unspecified    Chronic, stable. Continue current regimen.      Advanced care planning/counseling discussion    Advanced directive - has at home. HCPOA is [place] husband. Asked to bring me copy.       Other Visit Diagnoses    Skin exam, screening for cancer        Relevant Orders    Ambulatory referral to  Dermatology        Follow up plan: Return in about 6 months (around 12/30/2015), or as needed, for follow up visit.

## 2015-07-02 NOTE — Assessment & Plan Note (Signed)
Occurred while off coumadin without bridging.

## 2015-07-02 NOTE — Assessment & Plan Note (Signed)
Stable readings. Continue to monitor. If deteriorated, low threshold to refer to nephrology - pt agrees. RTC 6 mo f/u

## 2015-07-02 NOTE — Assessment & Plan Note (Signed)
Chronic, stable. Continue current regimen. 

## 2015-07-02 NOTE — Assessment & Plan Note (Signed)
Discussed avoiding added sugars/simple carbs

## 2015-07-02 NOTE — Assessment & Plan Note (Signed)

## 2015-07-02 NOTE — Progress Notes (Signed)
Pre visit review using our clinic review tool, if applicable. No additional management support is needed unless otherwise documented below in the visit note. 

## 2015-07-02 NOTE — Assessment & Plan Note (Signed)
Chronic, stable. Continue beta blocker, spironolactone, losartan, and aspirin. Followed by cards.

## 2015-07-02 NOTE — Patient Instructions (Addendum)
Pneumovax today. Call your insurance about the shingles shot to see if it is covered or how much it would cost and where is cheaper (here or pharmacy).  If you want to receive here, call for nurse visit.  We will refer you to dermatologist.  Bring me copy of living will to update your chart Bring me latest nephrology notes to review.  Check at home about bone density scan.  Return as needed or in 6 mo for follow up  Health Maintenance, Female Adopting a healthy lifestyle and getting preventive care can go a Brashier way to promote health and wellness. Talk with your health care provider about what schedule of regular examinations is right for you. This is a good chance for you to check in with your provider about disease prevention and staying healthy. In between checkups, there are plenty of things you can do on your own. Experts have done a lot of research about which lifestyle changes and preventive measures are most likely to keep you healthy. Ask your health care provider for more information. WEIGHT AND DIET  Eat a healthy diet  Be sure to include plenty of vegetables, fruits, low-fat dairy products, and lean protein.  Do not eat a lot of foods high in solid fats, added sugars, or salt.  Get regular exercise. This is one of the most important things you can do for your health.  Most adults should exercise for at least 150 minutes each week. The exercise should increase your heart rate and make you sweat (moderate-intensity exercise).  Most adults should also do strengthening exercises at least twice a week. This is in addition to the moderate-intensity exercise.  Maintain a healthy weight  Body mass index (BMI) is a measurement that can be used to identify possible weight problems. It estimates body fat based on height and weight. Your health care provider can help determine your BMI and help you achieve or maintain a healthy weight.  For females 12 years of age and older:   A BMI  below 18.5 is considered underweight.  A BMI of 18.5 to 24.9 is normal.  A BMI of 25 to 29.9 is considered overweight.  A BMI of 30 and above is considered obese.  Watch levels of cholesterol and blood lipids  You should start having your blood tested for lipids and cholesterol at 74 years of age, then have this test every 5 years.  You may need to have your cholesterol levels checked more often if:  Your lipid or cholesterol levels are high.  You are older than 74 years of age.  You are at high risk for heart disease.  CANCER SCREENING   Lung Cancer  Lung cancer screening is recommended for adults 37-77 years old who are at high risk for lung cancer because of a history of smoking.  A yearly low-dose CT scan of the lungs is recommended for people who:  Currently smoke.  Have quit within the past 15 years.  Have at least a 30-pack-year history of smoking. A pack year is smoking an average of one pack of cigarettes a day for 1 year.  Yearly screening should continue until it has been 15 years since you quit.  Yearly screening should stop if you develop a health problem that would prevent you from having lung cancer treatment.  Breast Cancer  Practice breast self-awareness. This means understanding how your breasts normally appear and feel.  It also means doing regular breast self-exams. Let your health care  provider know about any changes, no matter how small.  If you are in your 20s or 30s, you should have a clinical breast exam (CBE) by a health care provider every 1-3 years as part of a regular health exam.  If you are 20 or older, have a CBE every year. Also consider having a breast X-ray (mammogram) every year.  If you have a family history of breast cancer, talk to your health care provider about genetic screening.  If you are at high risk for breast cancer, talk to your health care provider about having an MRI and a mammogram every year.  Breast cancer gene  (BRCA) assessment is recommended for women who have family members with BRCA-related cancers. BRCA-related cancers include:  Breast.  Ovarian.  Tubal.  Peritoneal cancers.  Results of the assessment will determine the need for genetic counseling and BRCA1 and BRCA2 testing. Cervical Cancer Your health care provider may recommend that you be screened regularly for cancer of the pelvic organs (ovaries, uterus, and vagina). This screening involves a pelvic examination, including checking for microscopic changes to the surface of your cervix (Pap test). You may be encouraged to have this screening done every 3 years, beginning at age 42.  For women ages 70-65, health care providers may recommend pelvic exams and Pap testing every 3 years, or they may recommend the Pap and pelvic exam, combined with testing for human papilloma virus (HPV), every 5 years. Some types of HPV increase your risk of cervical cancer. Testing for HPV may also be done on women of any age with unclear Pap test results.  Other health care providers may not recommend any screening for nonpregnant women who are considered low risk for pelvic cancer and who do not have symptoms. Ask your health care provider if a screening pelvic exam is right for you.  If you have had past treatment for cervical cancer or a condition that could lead to cancer, you need Pap tests and screening for cancer for at least 20 years after your treatment. If Pap tests have been discontinued, your risk factors (such as having a new sexual partner) need to be reassessed to determine if screening should resume. Some women have medical problems that increase the chance of getting cervical cancer. In these cases, your health care provider may recommend more frequent screening and Pap tests. Colorectal Cancer  This type of cancer can be detected and often prevented.  Routine colorectal cancer screening usually begins at 74 years of age and continues through  74 years of age.  Your health care provider may recommend screening at an earlier age if you have risk factors for colon cancer.  Your health care provider may also recommend using home test kits to check for hidden blood in the stool.  A small camera at the end of a tube can be used to examine your colon directly (sigmoidoscopy or colonoscopy). This is done to check for the earliest forms of colorectal cancer.  Routine screening usually begins at age 88.  Direct examination of the colon should be repeated every 5-10 years through 74 years of age. However, you may need to be screened more often if early forms of precancerous polyps or small growths are found. Skin Cancer  Check your skin from head to toe regularly.  Tell your health care provider about any new moles or changes in moles, especially if there is a change in a mole's shape or color.  Also tell your health care provider  if you have a mole that is larger than the size of a pencil eraser.  Always use sunscreen. Apply sunscreen liberally and repeatedly throughout the day.  Protect yourself by wearing [place] sleeves, pants, a wide-brimmed hat, and sunglasses whenever you are outside. HEART DISEASE, DIABETES, AND HIGH BLOOD PRESSURE   High blood pressure causes heart disease and increases the risk of stroke. High blood pressure is more likely to develop in:  People who have blood pressure in the high end of the normal range (130-139/85-89 mm Hg).  People who are overweight or obese.  People who are African American.  If you are 37-60 years of age, have your blood pressure checked every 3-5 years. If you are 39 years of age or older, have your blood pressure checked every year. You should have your blood pressure measured twice--once when you are at a hospital or clinic, and once when you are not at a hospital or clinic. Record the average of the two measurements. To check your blood pressure when you are not at a hospital or  clinic, you can use:  An automated blood pressure machine at a pharmacy.  A home blood pressure monitor.  If you are between 45 years and 103 years old, ask your health care provider if you should take aspirin to prevent strokes.  Have regular diabetes screenings. This involves taking a blood sample to check your fasting blood sugar level.  If you are at a normal weight and have a low risk for diabetes, have this test once every three years after 74 years of age.  If you are overweight and have a high risk for diabetes, consider being tested at a younger age or more often. PREVENTING INFECTION  Hepatitis B  If you have a higher risk for hepatitis B, you should be screened for this virus. You are considered at high risk for hepatitis B if:  You were born in a country where hepatitis B is common. Ask your health care provider which countries are considered high risk.  Your parents were born in a high-risk country, and you have not been immunized against hepatitis B (hepatitis B vaccine).  You have HIV or AIDS.  You use needles to inject street drugs.  You live with someone who has hepatitis B.  You have had sex with someone who has hepatitis B.  You get hemodialysis treatment.  You take certain medicines for conditions, including cancer, organ transplantation, and autoimmune conditions. Hepatitis C  Blood testing is recommended for:  Everyone born from [birth year] through 1964.  Anyone with known risk factors for hepatitis C. Sexually transmitted infections (STIs)  You should be screened for sexually transmitted infections (STIs) including gonorrhea and chlamydia if:  You are sexually active and are younger than 74 years of age.  You are older than 74 years of age and your health care provider tells you that you are at risk for this type of infection.  Your sexual activity has changed since you were last screened and you are at an increased risk for chlamydia or gonorrhea. Ask  your health care provider if you are at risk.  If you do not have HIV, but are at risk, it may be recommended that you take a prescription medicine daily to prevent HIV infection. This is called pre-exposure prophylaxis (PrEP). You are considered at risk if:  You are sexually active and do not regularly use condoms or know the HIV status of your partner(s).  You take drugs by  injection.  You are sexually active with a partner who has HIV. Talk with your health care provider about whether you are at high risk of being infected with HIV. If you choose to begin PrEP, you should first be tested for HIV. You should then be tested every 3 months for as [place] as you are taking PrEP.  PREGNANCY   If you are premenopausal and you may become pregnant, ask your health care provider about preconception counseling.  If you may become pregnant, take 400 to 800 micrograms (mcg) of folic acid every day.  If you want to prevent pregnancy, talk to your health care provider about birth control (contraception). OSTEOPOROSIS AND MENOPAUSE   Osteoporosis is a disease in which the bones lose minerals and strength with aging. This can result in serious bone fractures. Your risk for osteoporosis can be identified using a bone density scan.  If you are 30 years of age or older, or if you are at risk for osteoporosis and fractures, ask your health care provider if you should be screened.  Ask your health care provider whether you should take a calcium or vitamin D supplement to lower your risk for osteoporosis.  Menopause may have certain physical symptoms and risks.  Hormone replacement therapy may reduce some of these symptoms and risks. Talk to your health care provider about whether hormone replacement therapy is right for you.  HOME CARE INSTRUCTIONS   Schedule regular health, dental, and eye exams.  Stay current with your immunizations.   Do not use any tobacco products including cigarettes, chewing  tobacco, or electronic cigarettes.  If you are pregnant, do not drink alcohol.  If you are breastfeeding, limit how much and how often you drink alcohol.  Limit alcohol intake to no more than 1 drink per day for nonpregnant women. One drink equals 12 ounces of beer, 5 ounces of wine, or 1 ounces of hard liquor.  Do not use street drugs.  Do not share needles.  Ask your health care provider for help if you need support or information about quitting drugs.  Tell your health care provider if you often feel depressed.  Tell your health care provider if you have ever been abused or do not feel safe at home.   This information is not intended to replace advice given to you by your health care provider. Make sure you discuss any questions you have with your health care provider.   Document Released: 12/23/2010 Document Revised: 06/30/2014 Document Reviewed: 05/11/2013 Elsevier Interactive Patient Education [employer].

## 2015-07-02 NOTE — Assessment & Plan Note (Signed)
Advanced directive - has at home. HCPOA is [place] husband. Asked to bring me copy.

## 2015-07-02 NOTE — Assessment & Plan Note (Signed)
Off statin and levels normal range. If remain normal, will remove from problem list.

## 2015-07-02 NOTE — Assessment & Plan Note (Signed)
Regularly followed by ophtho. 

## 2015-07-03 NOTE — Addendum Note (Signed)
Addended by: Royann Shivers A on: 07/03/2015 09:45 AM   Modules accepted: Orders

## 2015-07-11 ENCOUNTER — Ambulatory Visit (INDEPENDENT_AMBULATORY_CARE_PROVIDER_SITE_OTHER): Payer: Medicare Other | Admitting: *Deleted

## 2015-07-11 ENCOUNTER — Telehealth: Payer: Self-pay | Admitting: Family Medicine

## 2015-07-11 DIAGNOSIS — Z5181 Encounter for therapeutic drug level monitoring: Secondary | ICD-10-CM | POA: Diagnosis not present

## 2015-07-11 DIAGNOSIS — I4891 Unspecified atrial fibrillation: Secondary | ICD-10-CM

## 2015-07-11 DIAGNOSIS — Z953 Presence of xenogenic heart valve: Secondary | ICD-10-CM | POA: Diagnosis not present

## 2015-07-11 LAB — POCT INR: INR: 3.2

## 2015-07-11 NOTE — Telephone Encounter (Signed)
Returned your call.

## 2015-07-12 ENCOUNTER — Ambulatory Visit (INDEPENDENT_AMBULATORY_CARE_PROVIDER_SITE_OTHER): Payer: Medicare Other | Admitting: *Deleted

## 2015-07-12 DIAGNOSIS — I4891 Unspecified atrial fibrillation: Secondary | ICD-10-CM

## 2015-07-12 NOTE — Progress Notes (Signed)
Remote ICD transmission.   

## 2015-07-17 LAB — CUP PACEART REMOTE DEVICE CHECK
Battery Voltage: 2.97 V
Date Time Interrogation Session: 20170124120501
HIGH POWER IMPEDANCE MEASURED VALUE: 54 Ohm
Implantable Lead Implant Date: 20150316
Implantable Lead Location: 753860
Implantable Lead Serial Number: 25130583
Lead Channel Impedance Value: 564 Ohm
Lead Channel Pacing Threshold Pulse Width: 0.4 ms
Lead Channel Setting Pacing Pulse Width: 0.4 ms
MDC IDC LEAD IMPLANT DT: 20150316
MDC IDC LEAD LOCATION: 753858
MDC IDC MSMT LEADCHNL RV IMPEDANCE VALUE: 463 Ohm
MDC IDC MSMT LEADCHNL RV PACING THRESHOLD AMPLITUDE: 0.8 V
MDC IDC SET LEADCHNL LV PACING AMPLITUDE: 2.5 V
MDC IDC SET LEADCHNL LV PACING PULSEWIDTH: 0.4 ms
MDC IDC SET LEADCHNL RV PACING AMPLITUDE: 2 V
MDC IDC STAT BRADY RV PERCENT PACED: 99 %
Pulse Gen Serial Number: 60765179

## 2015-07-20 ENCOUNTER — Encounter: Payer: Self-pay | Admitting: Cardiology

## 2015-07-20 DIAGNOSIS — H401233 Low-tension glaucoma, bilateral, severe stage: Secondary | ICD-10-CM | POA: Diagnosis not present

## 2015-07-21 IMAGING — US US OUTSIDE FILMS BODY
1 series · 12 of 12 positions shown · non-contrast
Comparison: none

[Series 1: breast · 12 of 12 slices shown]
[im 1/12]
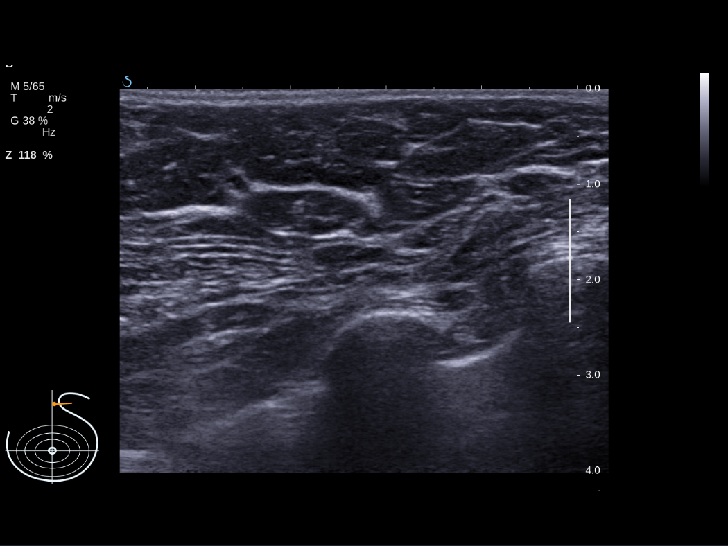
[im 2/12]
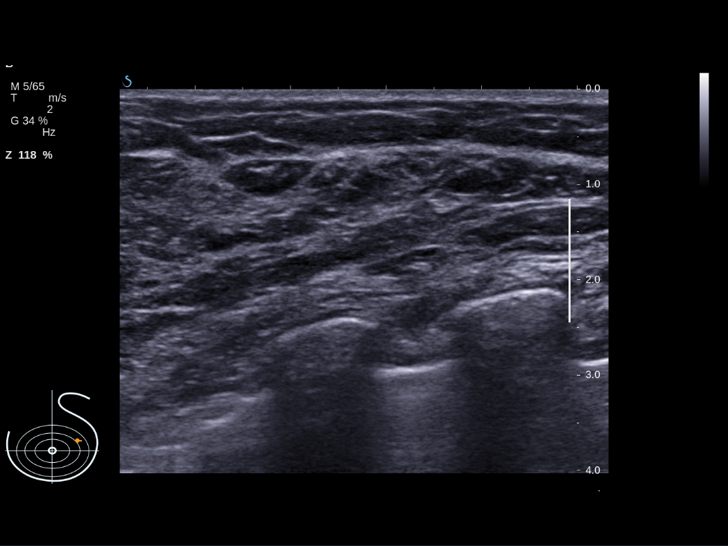
[im 3/12]
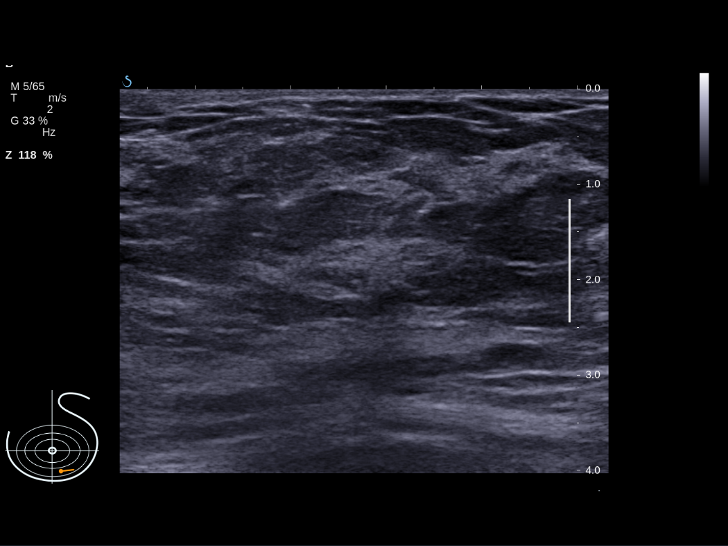
[im 4/12]
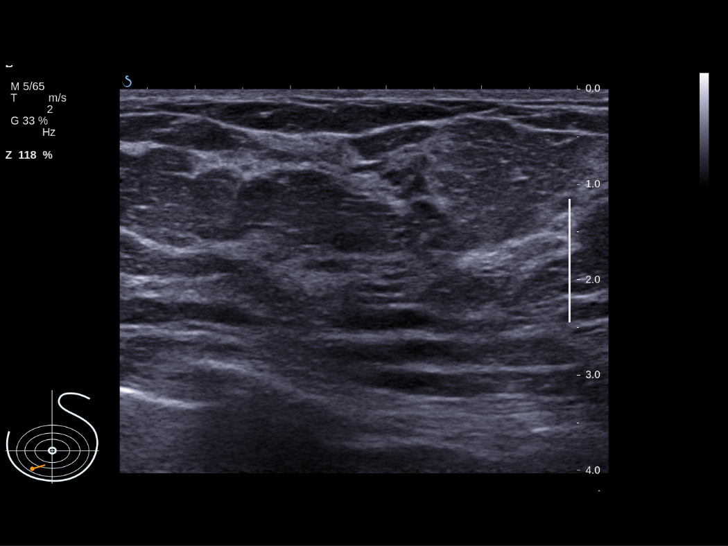
[im 5/12]
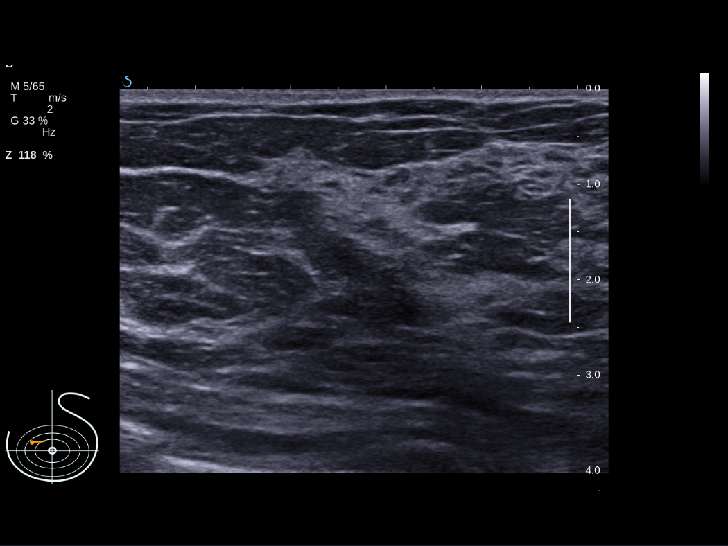
[im 6/12]
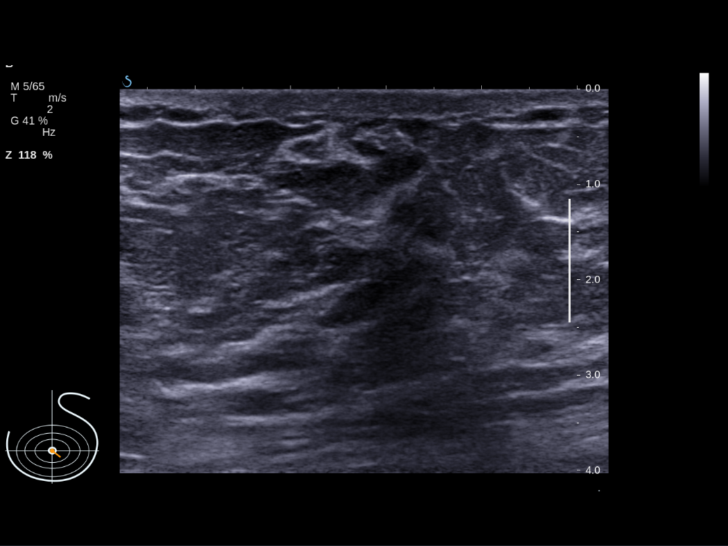
[im 7/12]
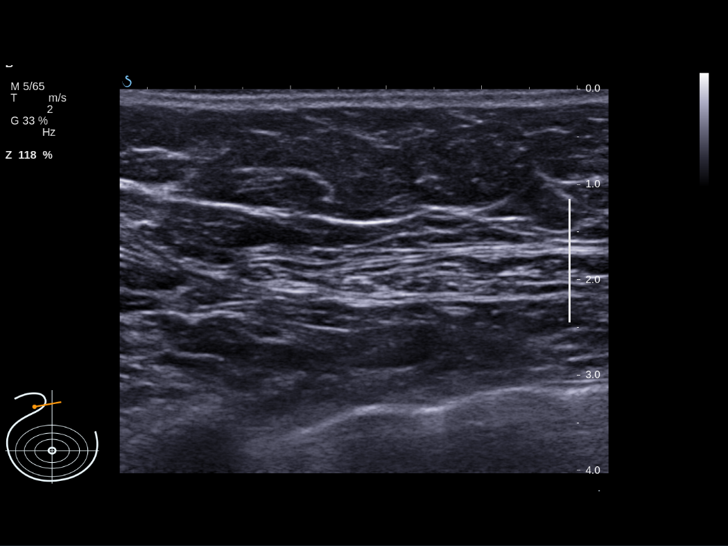
[im 8/12]
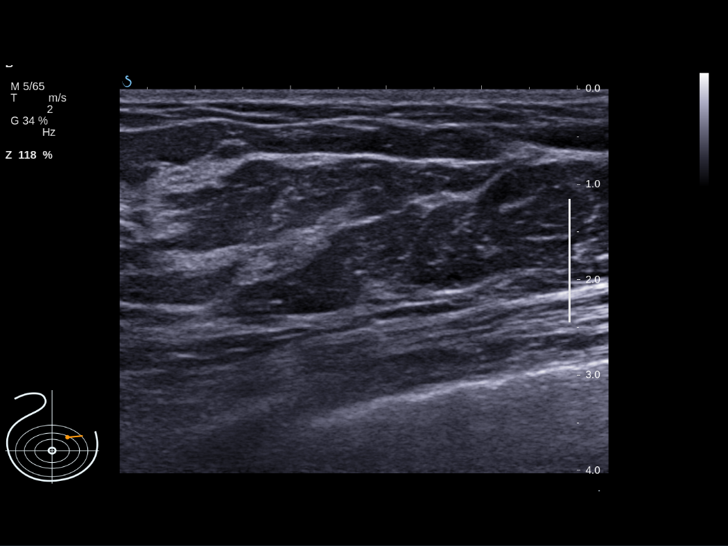
[im 9/12]
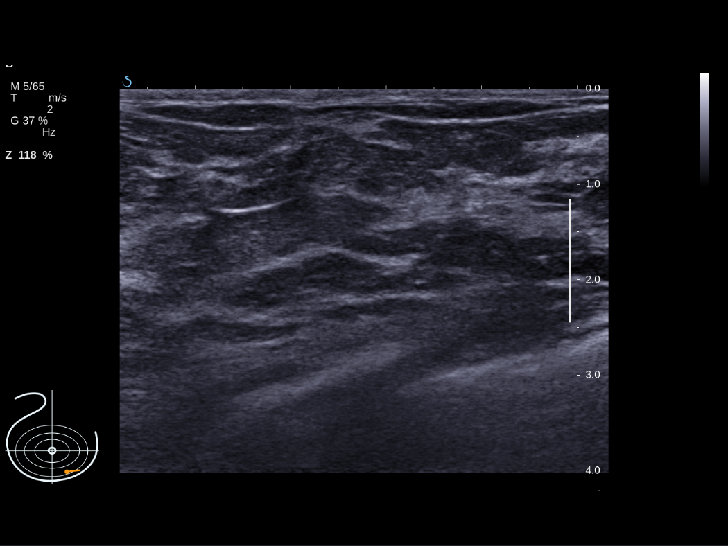
[im 10/12]
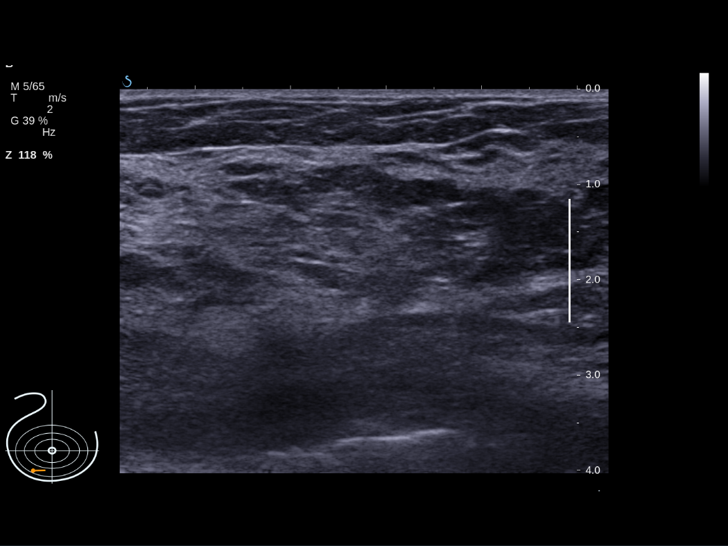
[im 11/12]
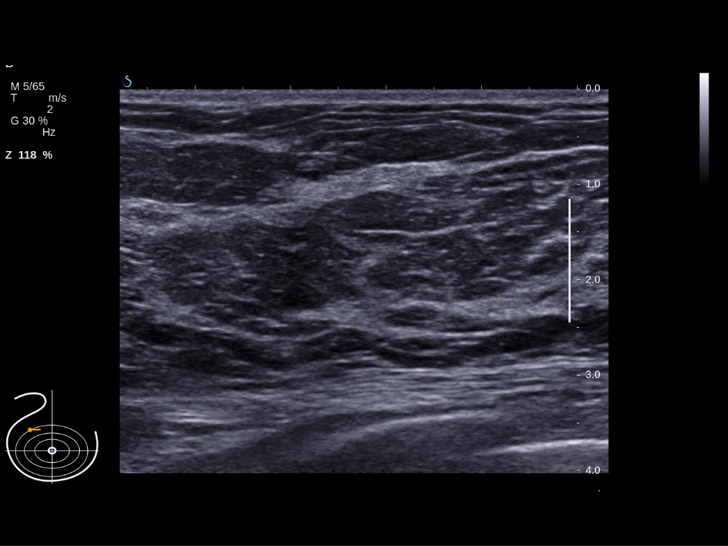
[im 12/12]
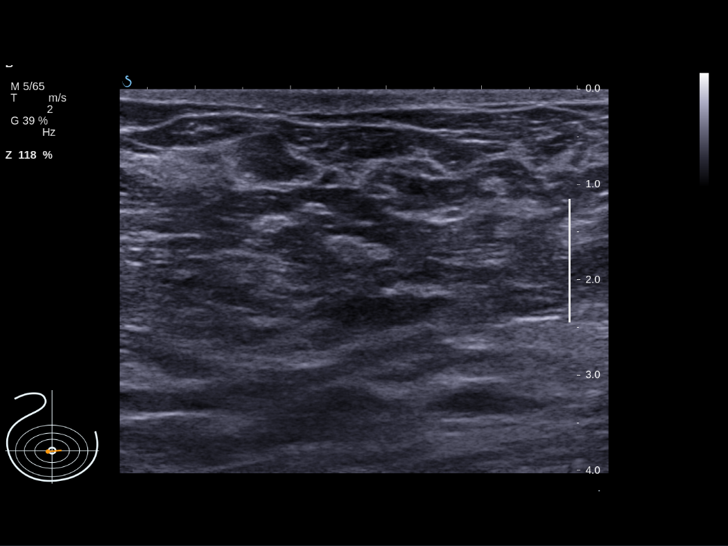

[12 of 12 positions shown; findings below may reference images not displayed]

Canned report from images found in remote index.

Refer to host system for actual result text.

## 2015-07-24 DIAGNOSIS — H401233 Low-tension glaucoma, bilateral, severe stage: Secondary | ICD-10-CM | POA: Diagnosis not present

## 2015-07-27 ENCOUNTER — Other Ambulatory Visit: Payer: Self-pay | Admitting: *Deleted

## 2015-07-27 ENCOUNTER — Telehealth: Payer: Self-pay | Admitting: *Deleted

## 2015-07-27 MED ORDER — BUMETANIDE 2 MG PO TABS: 2.0000 mg | ORAL_TABLET | Freq: Every day | ORAL | Status: DC

## 2015-07-27 NOTE — Telephone Encounter (Signed)
°*  STAT* If patient is at the pharmacy, call can be transferred to refill team.   1. Which medications need to be refilled? (please list name of each medication and dose if known) Bumax 2 mg   2. Which pharmacy/location (including street and city if local pharmacy) is medication to be sent to? [facility] at [address]   3. Do they need a 30 day or 90 day supply? 90 day   Pt is going to be out of this on Sunday

## 2015-07-27 NOTE — Telephone Encounter (Signed)
Bumex 2 mg sent to [facility] pharmacy.

## 2015-07-30 ENCOUNTER — Other Ambulatory Visit: Payer: Self-pay | Admitting: *Deleted

## 2015-07-30 MED ORDER — LOSARTAN POTASSIUM 25 MG PO TABS: 25.0000 mg | ORAL_TABLET | Freq: Every day | ORAL | Status: DC

## 2015-07-30 MED ORDER — CARVEDILOL 12.5 MG PO TABS: 12.5000 mg | ORAL_TABLET | Freq: Two times a day (BID) | ORAL | Status: DC

## 2015-07-30 NOTE — Telephone Encounter (Signed)
*  STAT* If patient is at the pharmacy, call can be transferred to refill team.    1. Which medications need to be refilled? (please list name of each medication and dose if known) carvedilol (COREG) 12.5 MG tablet and losartan (COZAAR) 25 MG tablet   2. Which pharmacy/location (including street and city if local pharmacy) is medication to be sent to? [place]  3. Do they need a 30 day or 90 day supply? 90 day

## 2015-07-30 NOTE — Telephone Encounter (Signed)
Refill sent for Coreg & Losartan

## 2015-08-01 ENCOUNTER — Other Ambulatory Visit: Payer: Self-pay

## 2015-08-01 ENCOUNTER — Ambulatory Visit (INDEPENDENT_AMBULATORY_CARE_PROVIDER_SITE_OTHER): Payer: Medicare Other

## 2015-08-01 DIAGNOSIS — Z5181 Encounter for therapeutic drug level monitoring: Secondary | ICD-10-CM

## 2015-08-01 DIAGNOSIS — Z953 Presence of xenogenic heart valve: Secondary | ICD-10-CM

## 2015-08-01 DIAGNOSIS — I4891 Unspecified atrial fibrillation: Secondary | ICD-10-CM | POA: Diagnosis not present

## 2015-08-01 LAB — POCT INR: INR: 3.7

## 2015-08-01 MED ORDER — CARVEDILOL 12.5 MG PO TABS: 12.5000 mg | ORAL_TABLET | Freq: Two times a day (BID) | ORAL | Status: DC

## 2015-08-01 MED ORDER — LOSARTAN POTASSIUM 25 MG PO TABS: 25.0000 mg | ORAL_TABLET | Freq: Every day | ORAL | Status: DC

## 2015-08-15 ENCOUNTER — Ambulatory Visit (INDEPENDENT_AMBULATORY_CARE_PROVIDER_SITE_OTHER): Payer: Medicare Other | Admitting: *Deleted

## 2015-08-15 DIAGNOSIS — I4891 Unspecified atrial fibrillation: Secondary | ICD-10-CM

## 2015-08-15 DIAGNOSIS — Z953 Presence of xenogenic heart valve: Secondary | ICD-10-CM | POA: Diagnosis not present

## 2015-08-15 DIAGNOSIS — Z5181 Encounter for therapeutic drug level monitoring: Secondary | ICD-10-CM | POA: Diagnosis not present

## 2015-08-15 LAB — POCT INR: INR: 2.8

## 2015-08-15 MED ORDER — WARFARIN SODIUM 3 MG PO TABS: 3.0000 mg | ORAL_TABLET | ORAL | Status: DC

## 2015-08-17 DIAGNOSIS — L578 Other skin changes due to chronic exposure to nonionizing radiation: Secondary | ICD-10-CM | POA: Diagnosis not present

## 2015-08-17 DIAGNOSIS — Z85820 Personal history of malignant melanoma of skin: Secondary | ICD-10-CM | POA: Diagnosis not present

## 2015-08-17 DIAGNOSIS — I8393 Asymptomatic varicose veins of bilateral lower extremities: Secondary | ICD-10-CM | POA: Diagnosis not present

## 2015-08-17 DIAGNOSIS — L821 Other seborrheic keratosis: Secondary | ICD-10-CM | POA: Diagnosis not present

## 2015-08-17 DIAGNOSIS — L905 Scar conditions and fibrosis of skin: Secondary | ICD-10-CM | POA: Diagnosis not present

## 2015-08-17 DIAGNOSIS — L814 Other melanin hyperpigmentation: Secondary | ICD-10-CM | POA: Diagnosis not present

## 2015-08-17 DIAGNOSIS — Z85828 Personal history of other malignant neoplasm of skin: Secondary | ICD-10-CM | POA: Diagnosis not present

## 2015-08-17 DIAGNOSIS — D1800 Hemangioma unspecified site: Secondary | ICD-10-CM | POA: Diagnosis not present

## 2015-08-17 DIAGNOSIS — Z1283 Encounter for screening for malignant neoplasm of skin: Secondary | ICD-10-CM | POA: Diagnosis not present

## 2015-08-17 DIAGNOSIS — D229 Melanocytic nevi, unspecified: Secondary | ICD-10-CM | POA: Diagnosis not present

## 2015-08-27 ENCOUNTER — Telehealth: Payer: Self-pay | Admitting: Family Medicine

## 2015-08-27 NOTE — Telephone Encounter (Signed)
Pt dropped off medical records. Once reviewed and copies are made. Pt would like to pick them up.  Records in Dr. Buel Ream

## 2015-09-02 ENCOUNTER — Encounter: Payer: Self-pay | Admitting: Family Medicine

## 2015-09-02 NOTE — Telephone Encounter (Signed)
I already reviewed these records in October. Placed back in Kim's box.

## 2015-09-04 NOTE — Telephone Encounter (Signed)
Patient notified and records placed up front for pick up.

## 2015-09-05 ENCOUNTER — Ambulatory Visit (INDEPENDENT_AMBULATORY_CARE_PROVIDER_SITE_OTHER): Payer: Medicare Other

## 2015-09-05 DIAGNOSIS — I4891 Unspecified atrial fibrillation: Secondary | ICD-10-CM

## 2015-09-05 DIAGNOSIS — Z5181 Encounter for therapeutic drug level monitoring: Secondary | ICD-10-CM | POA: Diagnosis not present

## 2015-09-05 DIAGNOSIS — Z953 Presence of xenogenic heart valve: Secondary | ICD-10-CM | POA: Diagnosis not present

## 2015-09-05 LAB — POCT INR: INR: 2.5

## 2015-09-11 ENCOUNTER — Ambulatory Visit (INDEPENDENT_AMBULATORY_CARE_PROVIDER_SITE_OTHER): Payer: Medicare Other | Admitting: Cardiovascular Disease

## 2015-09-11 ENCOUNTER — Encounter: Payer: Self-pay | Admitting: Cardiovascular Disease

## 2015-09-11 VITALS — BP 137/80 | HR 70 | Ht 63.0 in | Wt 148.0 lb

## 2015-09-11 DIAGNOSIS — I4891 Unspecified atrial fibrillation: Secondary | ICD-10-CM | POA: Diagnosis not present

## 2015-09-11 DIAGNOSIS — I639 Cerebral infarction, unspecified: Secondary | ICD-10-CM | POA: Diagnosis not present

## 2015-09-11 MED ORDER — MECLIZINE HCL 25 MG PO TABS: 25.0000 mg | ORAL_TABLET | Freq: Two times a day (BID) | ORAL | Status: DC | PRN

## 2015-09-11 NOTE — Progress Notes (Signed)
Cardiology Office Note   Date:  09/11/2015   ID:  Elizabeth, Hall 1941/04/09, MRN [id number]  PCP:  Ria Bush, MD  Cardiologist:   Kathlyn Sacramento, MD   Chief Complaint  Patient presents with  . other    6 month f/u c/o dizziness and leg weakness. Meds reviewed verbally with pt.      History of Present Illness: Elizabeth Hall is a 75 y.o. female who presents for  a follow-up visit. She has known history of chronic systolic heart failure due to nonischemic cardiomyopathy and possibly valvular heart disease. She used to be followed by [facility] [place]. Congestive heart failure was diagnosed in 2007. Most recent ejection fraction was 35-40% in August 2016. She has known history of atrial fibrillation. She underwent AV nodal ablation and pacemaker placement in November 2013. 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 Her ejection fraction deteriorated and she underwent explantation of the previous pacemaker and implantation of biventricular ICD in March 2015. She reports possible CVA in the past when her warfarin was held for a procedure. Since then, she has been bridged with low molecular weight heparin. She reports that her recommended INR has been between 2.5-3.  She has been doing reasonably well from a cardiac standpoint with stable exertional dyspnea. However, she is complaining of spinning sensation in her head followed by poor balance, nausea and headache. She reports having vertigo in the past with intermittent episodes.   Past Medical History  Diagnosis Date  . 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 Systolic CHF ([place])     EF 40% s/p MVR and TVA 2013  . Atrial fibrillation ([place])   . H. pylori infection     treated  . CKD (chronic kidney disease) stage 3, GFR 30-59 ml/min     baseline Cr 1.5-2; prior saw  nephrologist thought hypertensive nephropathy/renovascular disease  . Hypertension   . Melanoma ([place]) 2006    L shoulder  . Phlebitis   . 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 CAD (coronary artery disease) 03/09/2000    s/p MI 2001  . Glaucoma   . Bleeding of eye   . Hx of rheumatic fever   . Chronic systolic heart failure ([place])   . CVA (cerebral infarction) 2014    occipital lobe - some vision loss when coumadin held and not bridged  . Dyslipidemia     Past Surgical History  Procedure Laterality Date  . Defibrillator      MEDTRONIC 5086 MRI 52 CM LEAD, SERIAL # [id number]  . Ablation  2013  . Mitral valve replacement  05/2012  . 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 Total abdominal hysterectomy w/ bilateral salpingoophorectomy  1991    irregular bleeding  . Gallbladder surgery  2006  . Cataract extraction Bilateral 2007, 2010  . Insert / replace / remove pacemaker  08/2013  . US echocardiography  02/2014    EF 40%, LA marked dilation, gen LV hypokinesis, mitral prosthetic valve  . Colonoscopy  03/2014    diverticulosis, int hem, rpt 5 yrs for h/o polyps ([place])  . Dg abdomen complete ([facility] hx)  02/2013    HH, mod severe erosive gastritis, duodenitis  .  Atherectomy  06/2012    left arm after coumadin held without bridging     Current Outpatient Prescriptions  Medication Sig Dispense Refill  . aspirin 81 MG tablet Take 81 mg by mouth daily.    . bimatoprost (LUMIGAN) 0.03 % ophthalmic solution Place 1 drop into both eyes at bedtime.    . bumetanide (BUMEX) 2 MG tablet Take 1 tablet (2 mg total) by mouth daily. 90 tablet 3  . carvedilol (COREG) 12.5 MG tablet Take 1 tablet (12.5 mg total) by mouth 2 (two) times daily with a meal. 180 tablet 3  . estradiol (ESTRACE) 1 MG tablet Take 0.5 mg by mouth daily.    [name] losartan  (COZAAR) 25 MG tablet Take 1 tablet (25 mg total) by mouth daily. 90 tablet 3  . MAGNESIUM PO Take by mouth daily.    [name] spironolactone (ALDACTONE) 25 MG tablet TAKE 1 TABLET(25 MG) BY MOUTH DAILY 30 tablet 3  . VITAMIN E PO Take by mouth daily.    [name] warfarin (COUMADIN) 3 MG tablet Take 1 tablet (3 mg total) by mouth as directed. 100 tablet 1  . WARFARIN SODIUM PO Take 3 mg by mouth as directed.     . meclizine (ANTIVERT) 25 MG tablet Take 1 tablet (25 mg total) by mouth 2 (two) times daily as needed for dizziness. 60 tablet 2   No current facility-administered medications for this visit.    Allergies:   Amiodarone; Phytonadione; Biaxin; Ciprofloxacin; Flagyl; and Lisinopril    Social History:  The patient  reports that she has quit smoking. Her smoking use included Cigarettes. She has never used smokeless tobacco. She reports that she drinks alcohol. She reports that she does not use illicit drugs.   Family History:  The patient's family history includes CAD (age of onset: 54) in her father; Cancer in her cousin, cousin, and maternal aunt; Diabetes in her cousin; Heart failure in her brother and sister; Hypertension in her brother; Stroke in her brother and sister.    ROS:  Please see the history of present illness.   Otherwise, review of systems are positive for none.   All other systems are reviewed and negative.    PHYSICAL EXAM: VS:  BP 137/80 mmHg  Pulse 70  Ht 5\' 3"  (1.6 m)  Wt 148 lb (67.132 kg)  BMI 26.22 kg/m2  LMP  (LMP Unknown) , BMI Body mass index is 26.22 kg/(m^2). GEN: Well nourished, well developed, in no acute distress HEENT: normal Neck: no JVD, carotid bruits, or masses Cardiac: RRR; no murmurs, rubs, or gallops,no edema  Respiratory:  clear to auscultation bilaterally, normal work of breathing GI: soft, nontender, nondistended, + BS MS: no deformity or atrophy Skin: warm and dry, no rash Neuro:  Strength and sensation are intact Psych: euthymic mood, full  affect   EKG:  EKG is ordered today. The ekg ordered today demonstrates sinus rhythm with ventricular paced rhythm.   Recent Labs: 09/14/2014: HGB 13.3; Platelet 255; SGPT (ALT) 15 06/27/2015: BUN 24*; Creatinine, Ser 1.23*; Potassium 4.1; Sodium 140    Lipid Panel    Component Value Date/Time   CHOL 160 06/27/2015 0841   CHOL 192 06/12/2014   TRIG 62.0 06/27/2015 0841   TRIG 70 06/12/2014   HDL 51.60 06/27/2015 0841   CHOLHDL 3 06/27/2015 0841   VLDL 12.4 06/27/2015 0841   LDLCALC 96 06/27/2015 0841   LDLCALC 104 06/12/2014      Wt Readings from Last 3 Encounters:  09/11/15 148 lb (67.132 kg)  07/02/15 152 lb 4 oz (69.06 kg)  06/27/15 151 lb 12 oz (68.833 kg)         ASSESSMENT AND PLAN:  1.  Chronic systolic heart failure: She appears to be euvolemic on current dose of Bumex. Most recent ejection fraction was 35-40%. She is currently on optimal medical therapy but I might consider switching losartan to [facility] if covered by her insurance.  2. Paroxysmal atrial fibrillation: Continue [place]-term anticoagulation with warfarin.  3. Essential hypertension: Blood pressure is reasonably controlled on current medications.  4. Vertigo: The patient is having symptoms suggestive of vertigo. I prescribed her meclizine and I asked her to follow-up with Dr. Danise Mina if no improvement in symptoms.  5. Status post mitral valve replacement with tissue valve: This was functioning normally on most recent echocardiogram.    Disposition:   FU with me in 6 months  Signed,  Kathlyn Sacramento, MD  09/11/2015 5:34 PM    [place]

## 2015-09-11 NOTE — Patient Instructions (Addendum)
Medication Instructions:  Your physician has recommended you make the following change in your medication:  START taking meclizine 25mg  twice daily as needed   Labwork: none  Testing/Procedures: none  Follow-Up: Your physician wants you to follow-up in: six months with Dr. Fletcher Anon.  You will receive a reminder letter in the mail two months in advance. If you don't receive a letter, please call our office to schedule the follow-up appointment.    Any Other Special Instructions Will Be Listed Below (If Applicable).     If you need a refill on your cardiac medications before your next appointment, please call your pharmacy.

## 2015-10-03 ENCOUNTER — Ambulatory Visit (INDEPENDENT_AMBULATORY_CARE_PROVIDER_SITE_OTHER): Payer: Medicare Other

## 2015-10-03 DIAGNOSIS — I4891 Unspecified atrial fibrillation: Secondary | ICD-10-CM

## 2015-10-03 DIAGNOSIS — Z5181 Encounter for therapeutic drug level monitoring: Secondary | ICD-10-CM | POA: Diagnosis not present

## 2015-10-03 DIAGNOSIS — Z953 Presence of xenogenic heart valve: Secondary | ICD-10-CM

## 2015-10-03 LAB — POCT INR: INR: 2.6

## 2015-10-22 ENCOUNTER — Other Ambulatory Visit: Payer: Self-pay | Admitting: Cardiovascular Disease

## 2015-11-06 ENCOUNTER — Encounter: Payer: Self-pay | Admitting: Internal Medicine

## 2015-11-06 ENCOUNTER — Ambulatory Visit (INDEPENDENT_AMBULATORY_CARE_PROVIDER_SITE_OTHER): Payer: Medicare Other | Admitting: Internal Medicine

## 2015-11-06 VITALS — BP 110/72 | HR 80 | Ht 63.0 in | Wt 149.2 lb

## 2015-11-06 DIAGNOSIS — I5022 Chronic systolic (congestive) heart failure: Secondary | ICD-10-CM | POA: Diagnosis not present

## 2015-11-06 DIAGNOSIS — I10 Essential (primary) hypertension: Secondary | ICD-10-CM

## 2015-11-06 DIAGNOSIS — Z95810 Presence of automatic (implantable) cardiac defibrillator: Secondary | ICD-10-CM | POA: Diagnosis not present

## 2015-11-06 DIAGNOSIS — I639 Cerebral infarction, unspecified: Secondary | ICD-10-CM | POA: Diagnosis not present

## 2015-11-06 DIAGNOSIS — I4891 Unspecified atrial fibrillation: Secondary | ICD-10-CM

## 2015-11-06 NOTE — Progress Notes (Signed)
Electrophysiology Office Note   Date:  11/06/2015   ID:  Dawt Reeb, DOB 11-23-1940, MRN [id number]  PCP:  Ria Bush, MD  Cardiologist:  MA Primary Electrophysiologist:   Virl Axe, MD    No chief complaint on file.    History of Present Illness: Elizabeth Hall is a 75 y.o. female is seen for pacemaker followup having moved from [place] to be with family  She has hx of AFib and underwent AV nodal ablation and pacemaker placement in November 2013. 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 Her ejection fraction deteriorated and she underwent explantation of the previous pacemaker and implantation of biventricular ICD in March 2015.  She has hx of CVA with holding of anticoagulation; currently INR taret 2.5--3 with asa 81;  Hx of visual bleeding    Her last echo report 8/15 was reviewed with EF 40<<30-35%   Today, she denies symptoms of palpitations, chest pain  She has mild shortness of breath, orthopnea, PND, lower extremity edema, but no claudication, dizziness, presyncope, syncope, bleeding,    The patient is tolerating medications without difficulties and is otherwise without complaint today.    Past Medical History  Diagnosis Date  . 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 Systolic CHF ([place])     EF 40% s/p MVR and TVA 2013  . Atrial fibrillation ([place])   . H. pylori infection     treated  . CKD (chronic kidney disease) stage 3, GFR 30-59 ml/min     baseline Cr 1.5-2; prior saw nephrologist thought hypertensive nephropathy/renovascular disease  . Hypertension   . Melanoma ([place]) 2006    L shoulder  . Phlebitis   . 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 CAD  (coronary artery disease) 03/09/2000    s/p MI 2001  . Glaucoma   . Bleeding of eye   . Hx of rheumatic fever   . Chronic systolic heart failure ([place])   . CVA (cerebral infarction) 2014    occipital lobe - some vision loss when coumadin held and not bridged  . Dyslipidemia    Past Surgical History  Procedure Laterality Date  . Defibrillator      MEDTRONIC 5086 MRI 52 CM LEAD, SERIAL # [id number] [id number]  . Ablation  2013  . Mitral valve replacement  05/2012  . 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 Total abdominal hysterectomy w/ bilateral salpingoophorectomy  1991    irregular bleeding  . Gallbladder surgery  2006  . Cataract extraction Bilateral 2007, 2010  . Insert / replace / remove pacemaker  08/2013  . US echocardiography  02/2014    EF 40%, LA marked dilation, gen LV hypokinesis, mitral prosthetic valve  . Colonoscopy  03/2014    diverticulosis, int hem, rpt 5 yrs for h/o polyps ([place])  . Dg abdomen complete ([facility] hx)  02/2013    HH, mod severe erosive gastritis, duodenitis  . Atherectomy  06/2012    left arm after coumadin held without bridging     Current Outpatient Prescriptions  Medication Sig Dispense Refill  . aspirin 81 MG tablet Take 81 mg by mouth daily.    . bimatoprost (LUMIGAN) 0.03 % ophthalmic solution Place 1 drop into both eyes at  bedtime.    . bumetanide (BUMEX) 2 MG tablet Take 1 tablet (2 mg total) by mouth daily. 90 tablet 3  . carvedilol (COREG) 12.5 MG tablet Take 1 tablet (12.5 mg total) by mouth 2 (two) times daily with a meal. 180 tablet 3  . estradiol (ESTRACE) 1 MG tablet Take 0.5 mg by mouth daily.    [name] losartan (COZAAR) 25 MG tablet Take 1 tablet (25 mg total) by mouth daily. 90 tablet 3  . MAGNESIUM PO Take by mouth daily.    . meclizine (ANTIVERT) 25 MG tablet Take 1 tablet (25 mg total) by mouth 2 (two) times daily as needed for dizziness. 60 tablet 2  . spironolactone (ALDACTONE) 25 MG tablet TAKE 1 TABLET(25 MG) BY MOUTH DAILY 30 tablet 3  .  VITAMIN E PO Take by mouth daily.    [name] warfarin (COUMADIN) 3 MG tablet Take 1 tablet (3 mg total) by mouth as directed. 100 tablet 1  . WARFARIN SODIUM PO Take 3 mg by mouth as directed.      No current facility-administered medications for this visit.    Allergies:   Amiodarone; Phytonadione; Biaxin; Ciprofloxacin; Flagyl; and Lisinopril   Social History:  The patient  reports that she has quit smoking. Her smoking use included Cigarettes. She has never used smokeless tobacco. She reports that she drinks alcohol. She reports that she does not use illicit drugs.   Family History:  The patient's    family history includes CAD (age of onset: 21) in her father; Cancer in her cousin, cousin, and maternal aunt; Diabetes in her cousin; Heart failure in her brother and sister; Hypertension in her brother; Stroke in her brother and sister.    ROS:  Please see the history of present illness and past medical history  Otherwise, all other systems were reviewed and were negative.     PHYSICAL EXAM: VS:  LMP  (LMP Unknown) , BMI There is no weight on file to calculate BMI. GEN: Well nourished, well developed, in no acute distress HEENT: normal Neck:  JVD flat, carotid bruits, or masses Cardiac: REGULAR RATE and RHYTHM ; 2/6  murmurs, rubs, No S4  Back without kyphosis; No CVAT Respiratory:  clear to auscultation bilaterally, normal work of breathing GI: soft, nontender, nondistended, + BS MS: no deformity or atrophy Extremities no clubbing cyanosis no edema Skin: warm and dry,  device pocket is well healed without teathering Neuro:  Strength and sensation are intact Psych: euthymic mood, full affect  EKG:  EKG is ordered today. The ekg ordered today shows atrial fib with V pacing 77 with QRS neg V1-V3.  QRS duration 140 ms  Device interrogation is reviewed today in detail.  See PaceArt for details.   Recent Labs: 06/27/2015: BUN 24*; Creatinine, Ser 1.23*; Potassium 4.1; Sodium 140    Lipid  Panel     Component Value Date/Time   CHOL 160 06/27/2015 0841   CHOL 192 06/12/2014   TRIG 62.0 06/27/2015 0841   TRIG 70 06/12/2014   HDL 51.60 06/27/2015 0841   CHOLHDL 3 06/27/2015 0841   VLDL 12.4 06/27/2015 0841   LDLCALC 96 06/27/2015 0841   LDLCALC 104 06/12/2014     Wt Readings from Last 3 Encounters:  09/11/15 148 lb (67.132 kg)  07/02/15 152 lb 4 oz (69.06 kg)  06/27/15 151 lb 12 oz (68.833 kg)      Other studies Reviewed: Additional studies/ records that were reviewed today include: echo reports  Demonstrating :  As above    ASSESSMENT AND PLAN: NICM  Complete heart block  S/p AV Ablation  ICD - CRT Biotronik  Atrial Fib  Permanent  CVA  Congestive heart failure-chronic-systolic-class II  High risk medication surveillance  ECG-unusual for CRT    The patient has nonischemic cardiomyopathy and is on appropriate guideline directed medical therapy. We have reviewed the importance of medication surveillance in his case related to potassium levels. They should be checked every 3 months. We will check them today.   Anticoagulation is appropriate with adjunctive aspirin and I think the upper level of 3.0 is reasonable with her prior history of ocular bleeding  Euvolemic continue current meds     Current medicines are reviewed at length with the patient today.   The patient does not have concerns regarding her medicines.  She did have questions about traveling with her ICD The following changes were made today:  none  Labs/ tests ordered today include: met profile  and chest x-ray  No orders of the defined types were placed in this encounter.     Disposition:   FU with me  1 year(s)  Signed, Virl Axe, MD  11/06/2015 10:59 AM     [facility] [address] [place] [address] [phone] (office) [phone] (fax)

## 2015-11-06 NOTE — Patient Instructions (Signed)
Medication Instructions: - Your physician recommends that you continue on your current medications as directed. Please refer to the Current Medication list given to you today.  Labwork: - Your physician recommends that you have lab work today: [employer]  Procedures/Testing: - none  Follow-Up: - Remote monitoring is used to monitor your Pacemaker of ICD from home. This monitoring reduces the number of office visits required to check your device to one time per year. It allows [place] to keep an eye on the functioning of your device to ensure it is working properly. You are scheduled for a device check from home on 02/05/16. You may send your transmission at any time that day. If you have a wireless device, the transmission will be sent automatically. After your physician reviews your transmission, you will receive a postcard with your next transmission date.  - Your physician wants you to follow-up in: 1 year with Dr. Caryl Comes. You will receive a reminder letter in the mail two months in advance. If you don't receive a letter, please call our office to schedule the follow-up appointment.  Any Additional Special Instructions Will Be Listed Below (If Applicable).     If you need a refill on your cardiac medications before your next appointment, please call your pharmacy.

## 2015-11-07 LAB — BASIC METABOLIC PANEL
BUN/Creatinine Ratio: 26 (ref 12–28)
BUN: 34 mg/dL — ABNORMAL HIGH (ref 8–27)
CHLORIDE: 102 mmol/L (ref 96–106)
CO2: 23 mmol/L (ref 18–29)
Calcium: 10.4 mg/dL — ABNORMAL HIGH (ref 8.7–10.3)
Creatinine, Ser: 1.3 mg/dL — ABNORMAL HIGH (ref 0.57–1.00)
GFR calc non Af Amer: 41 mL/min/{1.73_m2} — ABNORMAL LOW (ref >59)
GFR, EST AFRICAN AMERICAN: 47 mL/min/{1.73_m2} — AB (ref >59)
Glucose: 100 mg/dL — ABNORMAL HIGH (ref 65–99)
POTASSIUM: 5.3 mmol/L — AB (ref 3.5–5.2)
SODIUM: 144 mmol/L (ref 134–144)

## 2015-11-09 ENCOUNTER — Telehealth: Payer: Self-pay | Admitting: Internal Medicine

## 2015-11-09 NOTE — Telephone Encounter (Signed)
Pt would like to discuss her lab results. Please call

## 2015-11-09 NOTE — Telephone Encounter (Signed)
Patient called in concerned with lab results that she saw in mychart. Let her know that I would let Dr. Caryl Comes know that she was concerned and see if he had any recommendations. She agreed with plan and had no further questions at this time.

## 2015-11-12 ENCOUNTER — Other Ambulatory Visit: Payer: Self-pay | Admitting: *Deleted

## 2015-11-12 ENCOUNTER — Telehealth: Payer: Self-pay | Admitting: *Deleted

## 2015-11-12 DIAGNOSIS — E875 Hyperkalemia: Secondary | ICD-10-CM

## 2015-11-12 LAB — CUP PACEART INCLINIC DEVICE CHECK
Battery Voltage: 2.99 V
HighPow Impedance: 52 Ohm
Implantable Lead Implant Date: 20150316
Implantable Lead Location: 753858
Implantable Lead Location: 753860
Lead Channel Impedance Value: 434 Ohm
Lead Channel Pacing Threshold Amplitude: 0.7 V
Lead Channel Pacing Threshold Amplitude: 1.1 V
Lead Channel Pacing Threshold Pulse Width: 0.4 ms
Lead Channel Setting Pacing Pulse Width: 0.4 ms
Lead Channel Setting Sensing Sensitivity: 0.8 mV
Lead Channel Setting Sensing Sensitivity: 1.6 mV
MDC IDC LEAD IMPLANT DT: 20150316
MDC IDC LEAD SERIAL: 25130583
MDC IDC MSMT LEADCHNL LV IMPEDANCE VALUE: 550 Ohm
MDC IDC MSMT LEADCHNL LV PACING THRESHOLD PULSEWIDTH: 0.4 ms
MDC IDC SESS DTM: 20170516152700
MDC IDC SET LEADCHNL LV PACING AMPLITUDE: 2.5 V
MDC IDC SET LEADCHNL RV PACING AMPLITUDE: 2 V
MDC IDC SET LEADCHNL RV PACING PULSEWIDTH: 0.4 ms
MDC IDC STAT BRADY RV PERCENT PACED: 99 %
Pulse Gen Serial Number: 60765179

## 2015-11-12 NOTE — Telephone Encounter (Signed)
Called patient to advise her that she does not need to bring her transmitter with her as she will only be gone for a week and that she should follow our shock protocol if she has any concerns.  Reviewed protocol with patient--1 shock and no symptoms, no driving until we can determine if therapy was appropriate, call us as soon as she returns from her trip.  1 shock and any symptoms (dizziness, syncope, SOB, etc.)--no driving, seek emergency services.  2 or more shocks in 24 hours--no driving, seek emergency services.  Patient verbalizes understanding of protocol.  Advised her to bring her ICD ID card with her.  Patient verbalizes understanding of protocol and denies additional questions or concerns at this time.  She is appreciative of assistance.

## 2015-11-12 NOTE — Telephone Encounter (Signed)
-----   Message from Atiyah Bauer Filbert, RN sent at 11/12/2015  1:37 PM EDT ----- I called this patient about a lab. She is going out of the country on 6/9. She will only be gone for a week, so I advised her that she did not need to take her transmitter, but she states she thought she was told if her device fires out of the country x 1, then she needs to go to the ER. I advised her I wasn't sure what ya'll were telling patients internationally- can you please call her to clarify?  Thanks!

## 2015-11-14 ENCOUNTER — Ambulatory Visit (INDEPENDENT_AMBULATORY_CARE_PROVIDER_SITE_OTHER): Payer: Medicare Other | Admitting: *Deleted

## 2015-11-14 DIAGNOSIS — Z5181 Encounter for therapeutic drug level monitoring: Secondary | ICD-10-CM

## 2015-11-14 DIAGNOSIS — I4891 Unspecified atrial fibrillation: Secondary | ICD-10-CM

## 2015-11-14 DIAGNOSIS — Z953 Presence of xenogenic heart valve: Secondary | ICD-10-CM | POA: Diagnosis not present

## 2015-11-14 LAB — POCT INR: INR: 2.5

## 2015-11-20 NOTE — Telephone Encounter (Signed)
Spoke with patient and let her know that per Dr. Caryl Comes kidney function has been stable over time. She verbalized understanding and stated that she has upcoming appointment 11/26/15 to have her labs rechecked. Patient verbalized understanding and had no further questions at this time.

## 2015-11-20 NOTE — Telephone Encounter (Signed)
Kidney function is stable over time Potassium level is increased Please repeat the bmet

## 2015-11-26 ENCOUNTER — Other Ambulatory Visit (INDEPENDENT_AMBULATORY_CARE_PROVIDER_SITE_OTHER): Payer: Medicare Other | Admitting: *Deleted

## 2015-11-26 DIAGNOSIS — E875 Hyperkalemia: Secondary | ICD-10-CM

## 2015-11-27 LAB — BASIC METABOLIC PANEL
BUN/Creatinine Ratio: 22 (ref 12–28)
BUN: 27 mg/dL (ref 8–27)
CALCIUM: 9.3 mg/dL (ref 8.7–10.3)
CHLORIDE: 100 mmol/L (ref 96–106)
CO2: 23 mmol/L (ref 18–29)
Creatinine, Ser: 1.25 mg/dL — ABNORMAL HIGH (ref 0.57–1.00)
GFR calc Af Amer: 49 mL/min/{1.73_m2} — ABNORMAL LOW (ref >59)
GFR, EST NON AFRICAN AMERICAN: 43 mL/min/{1.73_m2} — AB (ref >59)
GLUCOSE: 98 mg/dL (ref 65–99)
POTASSIUM: 4.9 mmol/L (ref 3.5–5.2)
SODIUM: 140 mmol/L (ref 134–144)

## 2015-12-18 DIAGNOSIS — H401233 Low-tension glaucoma, bilateral, severe stage: Secondary | ICD-10-CM | POA: Diagnosis not present

## 2016-01-02 ENCOUNTER — Ambulatory Visit (INDEPENDENT_AMBULATORY_CARE_PROVIDER_SITE_OTHER): Payer: Medicare Other | Admitting: *Deleted

## 2016-01-02 DIAGNOSIS — Z5181 Encounter for therapeutic drug level monitoring: Secondary | ICD-10-CM | POA: Diagnosis not present

## 2016-01-02 DIAGNOSIS — I4891 Unspecified atrial fibrillation: Secondary | ICD-10-CM

## 2016-01-02 DIAGNOSIS — Z953 Presence of xenogenic heart valve: Secondary | ICD-10-CM | POA: Diagnosis not present

## 2016-01-02 LAB — POCT INR: INR: 2.8

## 2016-01-03 ENCOUNTER — Other Ambulatory Visit: Payer: Self-pay

## 2016-01-03 ENCOUNTER — Telehealth: Payer: Self-pay | Admitting: Cardiovascular Disease

## 2016-01-03 DIAGNOSIS — R0602 Shortness of breath: Secondary | ICD-10-CM

## 2016-01-03 NOTE — Telephone Encounter (Signed)
Pt c/o Shortness Of Breath: STAT if SOB developed within the last 24 hours or pt is noticeably SOB on the phone  1. Are you currently SOB (can you hear that pt is SOB on the phone)? no  2. How [place] have you been experiencing SOB? Increasing the past 4-5 weeks  3. Are you SOB when sitting or when up moving around? Moving around  4. Are you currently experiencing any other symptoms? Weight gain 2-3 pounds over a couple days, will lose it, sometimes. States her hand and feet are a "little puffy".

## 2016-01-03 NOTE — Telephone Encounter (Signed)
S/w pt who reports increasingly dyspnea w/exertion x 4-5 weeks. She is still able to lay flat to sleep but with a bit of difficulty. States it bothers her because it "hasn't been like this for 3 years". She has afib and CHF for which she sees Dr. Fletcher Anon and followed by Dr. Caryl Comes for pacemaker/defib. Pacemaker 2013, then changed to [facility] March 2015. Dec 2013 valve replacement.   She occasionally feels the irregular heartbeat. She pushes a big cart at work and has noticed she is "puffing when I get it to where it needs to be"  And has had difficulty walking and talking at same time.  Pt takes bumex and spironolactone w/no missed doses, monitors fluid intake and follows low sodium diet.  Has taken extra bumex past two days d/t weight fluctuation by 2-3 lbs along w/bilateral hand and foot edema.  States meds are not helping as much as they use to.  August 2016 echo 35-40% Per March 2016 OV w/Dr. Fletcher Anon, "Chronic systolic heart failure: She appears to be euvolemic on current dose of Bumex. Most recent ejection fraction was 35-40%. She is currently on optimal medical therapy but I might consider switching losartan to [facility] if covered by her insurance"  Advised pt to elevate feet when able, follow low sodium diet, limit fluids, avoid heat. Will forward to Dr. Fletcher Anon to review and advise.

## 2016-01-03 NOTE — Telephone Encounter (Signed)
Schedule her for an echo then follow up visit.

## 2016-01-04 NOTE — Telephone Encounter (Signed)
Reviewed recommendations w/pt who is agreeable to echo 7/28 @ 3pm. Pt has f/u 9/18 and has been added to wait list for sooner appt.

## 2016-01-18 ENCOUNTER — Other Ambulatory Visit: Payer: Self-pay

## 2016-01-18 ENCOUNTER — Ambulatory Visit (INDEPENDENT_AMBULATORY_CARE_PROVIDER_SITE_OTHER): Payer: Medicare Other

## 2016-01-18 DIAGNOSIS — R0602 Shortness of breath: Secondary | ICD-10-CM | POA: Diagnosis not present

## 2016-02-05 ENCOUNTER — Ambulatory Visit (INDEPENDENT_AMBULATORY_CARE_PROVIDER_SITE_OTHER): Payer: Medicare Other | Admitting: *Deleted

## 2016-02-05 DIAGNOSIS — Z95810 Presence of automatic (implantable) cardiac defibrillator: Secondary | ICD-10-CM

## 2016-02-05 DIAGNOSIS — I4891 Unspecified atrial fibrillation: Secondary | ICD-10-CM

## 2016-02-05 NOTE — Progress Notes (Signed)
Remote ICD transmission.   

## 2016-02-06 ENCOUNTER — Encounter: Payer: Self-pay | Admitting: Cardiology

## 2016-02-08 LAB — CUP PACEART REMOTE DEVICE CHECK
Brady Statistic RV Percent Paced: 98 %
HIGH POWER IMPEDANCE MEASURED VALUE: 55 Ohm
Implantable Lead Implant Date: 20150316
Implantable Lead Location: 753858
Implantable Lead Serial Number: 25130583
Lead Channel Setting Pacing Amplitude: 2 V
Lead Channel Setting Pacing Pulse Width: 0.4 ms
Lead Channel Setting Pacing Pulse Width: 0.4 ms
Lead Channel Setting Sensing Sensitivity: 1.6 mV
MDC IDC LEAD IMPLANT DT: 20150316
MDC IDC LEAD LOCATION: 753860
MDC IDC MSMT LEADCHNL LV IMPEDANCE VALUE: 564 Ohm
MDC IDC MSMT LEADCHNL RV IMPEDANCE VALUE: 463 Ohm
MDC IDC MSMT LEADCHNL RV PACING THRESHOLD AMPLITUDE: 0.7 V
MDC IDC MSMT LEADCHNL RV PACING THRESHOLD PULSEWIDTH: 0.4 ms
MDC IDC SESS DTM: 20170818164923
MDC IDC SET LEADCHNL LV PACING AMPLITUDE: 2.5 V
MDC IDC SET LEADCHNL RV SENSING SENSITIVITY: 0.8 mV
Pulse Gen Model: 383547
Pulse Gen Serial Number: 60765179

## 2016-02-13 ENCOUNTER — Ambulatory Visit (INDEPENDENT_AMBULATORY_CARE_PROVIDER_SITE_OTHER): Payer: Medicare Other

## 2016-02-13 DIAGNOSIS — Z5181 Encounter for therapeutic drug level monitoring: Secondary | ICD-10-CM

## 2016-02-13 DIAGNOSIS — Z953 Presence of xenogenic heart valve: Secondary | ICD-10-CM

## 2016-02-13 DIAGNOSIS — I4891 Unspecified atrial fibrillation: Secondary | ICD-10-CM

## 2016-02-13 LAB — POCT INR: INR: 4.1

## 2016-02-15 ENCOUNTER — Other Ambulatory Visit: Payer: Self-pay | Admitting: Cardiovascular Disease

## 2016-02-15 NOTE — Telephone Encounter (Signed)
Please review for refill. Thanks!  

## 2016-02-19 ENCOUNTER — Ambulatory Visit (INDEPENDENT_AMBULATORY_CARE_PROVIDER_SITE_OTHER): Payer: Medicare Other | Admitting: Cardiovascular Disease

## 2016-02-19 ENCOUNTER — Encounter: Payer: Self-pay | Admitting: Cardiovascular Disease

## 2016-02-19 VITALS — BP 130/72 | HR 78 | Ht 63.0 in | Wt 149.0 lb

## 2016-02-19 DIAGNOSIS — I5022 Chronic systolic (congestive) heart failure: Secondary | ICD-10-CM

## 2016-02-19 DIAGNOSIS — M79604 Pain in right leg: Secondary | ICD-10-CM | POA: Diagnosis not present

## 2016-02-19 DIAGNOSIS — I4891 Unspecified atrial fibrillation: Secondary | ICD-10-CM

## 2016-02-19 DIAGNOSIS — I639 Cerebral infarction, unspecified: Secondary | ICD-10-CM | POA: Diagnosis not present

## 2016-02-19 DIAGNOSIS — M79605 Pain in left leg: Secondary | ICD-10-CM

## 2016-02-19 DIAGNOSIS — R0602 Shortness of breath: Secondary | ICD-10-CM | POA: Diagnosis not present

## 2016-02-19 MED ORDER — SACUBITRIL-VALSARTAN 24-26 MG PO TABS: 1.0000 | ORAL_TABLET | Freq: Two times a day (BID) | ORAL | 3 refills | Status: DC

## 2016-02-19 NOTE — Patient Instructions (Signed)
Medication Instructions:  Your physician has recommended you make the following change in your medication:  STOP taking losartan START taking entresto 24-26mg  twice daily   Labwork: CBC, BMET, TSH, BNP in one week  Testing/Procedures: Your physician has requested that you have a lower extremity arterial doppler. During this test, ultrasound is used to evaluate blood flow in the legs. Allow one hour for this exam. There are no restrictions or special instructions.   Follow-Up: Your physician recommends that you schedule a follow-up appointment with Dr. Fletcher Anon after testing is complete   Any Other Special Instructions Will Be Listed Below (If Applicable).     If you need a refill on your cardiac medications before your next appointment, please call your pharmacy.

## 2016-02-19 NOTE — Progress Notes (Signed)
Cardiology Office Note   Date:  02/19/2016   ID:  Elizabeth, Hall July 17, 1940, MRN [id number]  PCP:  Ria Bush, MD  Cardiologist:   Kathlyn Sacramento, MD   Chief Complaint  Patient presents with  . Other    6 month follow up as well as discuss Echo.  Pt. c/o shortness of breath.       History of Present Illness: Elizabeth Hall is a 75 y.o. female who presents for  a follow-up visit. 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 Congestive heart failure was diagnosed in 2007.  She has known history of atrial fibrillation. She underwent AV nodal ablation and pacemaker placement in November 2013. 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 Her ejection fraction deteriorated and she underwent explantation of the previous pacemaker and implantation of biventricular ICD in March 2015. She had CVA in the past when her warfarin was held for a procedure. Since then, she has been bridged with low molecular weight heparin. She called our office and complained of increased exertional dyspnea which is somewhat unusual for her in the last 2 years. She had no orthopnea, PND, leg edema or increased weight. There has been no change in her medications. She denies any chest pain.  I proceeded with an echocardiogram which showed an ejection fraction of 123456, grade 2 diastolic dysfunction, replaced mitral valve with moderate regurgitation and a mean gradient of 5 mmHg. Left atrium was severely dilated. Pulmonary pressure could not be estimated.  She also complains of bilateral leg pain with walking and occasionally at rest.   Past Medical History:  Diagnosis Date  . Atrial fibrillation ([place])   . Bleeding of eye   . CAD (coronary artery disease) 03/09/2000   s/p MI 2001  . Chronic systolic heart failure ([place])   .  CKD (chronic kidney disease) stage 3, GFR 30-59 ml/min    baseline Cr 1.5-2; prior saw nephrologist thought hypertensive nephropathy/renovascular disease  . CVA (cerebral infarction) 2014   occipital lobe - some vision loss when coumadin held and not bridged  . Dyslipidemia   . Glaucoma   . H. pylori infection    treated  . 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 Hx of rheumatic fever   . Hypertension   . Melanoma ([place]) 2006   L shoulder  . Phlebitis   . Systolic CHF ([place])    EF [id number] s/p MVR and TVA 2013  . 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 ABLATION  2013  . ATHERECTOMY  06/2012   left arm after coumadin held without bridging  . CATARACT EXTRACTION Bilateral 2007, 2010  . COLONOSCOPY  03/2014   diverticulosis, int hem, rpt 5 yrs for h/o polyps ([place])  . defibrillator     MEDTRONIC 5086 MRI 52 CM LEAD, SERIAL # LFP [id number]  . DG ABDOMEN COMPLETE ([facility] HX)  02/2013   HH, mod severe erosive gastritis, duodenitis  . GALLBLADDER SURGERY  2006  . INSERT / REPLACE / REMOVE PACEMAKER  08/2013  . MITRAL VALVE REPLACEMENT  05/2012  . TOTAL ABDOMINAL HYSTERECTOMY W/ BILATERAL SALPINGOOPHORECTOMY  1991  irregular bleeding  . TRICUSPID VALVE REPLACEMENT  05/2012  . US ECHOCARDIOGRAPHY  02/2014   EF 40%, LA marked dilation, gen LV hypokinesis, mitral prosthetic valve     Current Outpatient Prescriptions  Medication Sig Dispense Refill  . aspirin 81 MG tablet Take 81 mg by mouth daily.    . bimatoprost (LUMIGAN) 0.03 % ophthalmic solution Place 1 drop into both eyes at bedtime.    . bumetanide (BUMEX) 2 MG tablet Take 1 tablet (2 mg total) by mouth daily. 90 tablet 3  . carvedilol (COREG) 12.5 MG tablet Take 1 tablet (12.5 mg total) by mouth 2 (two) times daily with a meal. 180  tablet 3  . estradiol (ESTRACE) 1 MG tablet Take 0.5 mg by mouth daily.    [name] losartan (COZAAR) 25 MG tablet Take 1 tablet (25 mg total) by mouth daily. 90 tablet 3  . MAGNESIUM PO Take by mouth daily.    . meclizine (ANTIVERT) 25 MG tablet Take 1 tablet (25 mg total) by mouth 2 (two) times daily as needed for dizziness. 60 tablet 2  . spironolactone (ALDACTONE) 25 MG tablet TAKE 1 TABLET(25 MG) BY MOUTH DAILY (Patient taking differently: TAKE 1/2 TABLET(25 MG) BY MOUTH DAILY) 30 tablet 3  . VITAMIN E PO Take by mouth daily.    [name] warfarin (COUMADIN) 3 MG tablet TAKE 1 TABLET BY MOUTH AS DIRECTED 100 tablet 1   No current facility-administered medications for this visit.     Allergies:   Amiodarone; Phytonadione [vitamin k1]; Biaxin [clarithromycin]; Ciprofloxacin; Flagyl [metronidazole]; and Lisinopril    Social History:  The patient  reports that she has quit smoking. Her smoking use included Cigarettes. She has never used smokeless tobacco. She reports that she drinks alcohol. She reports that she does not use drugs.   Family History:  The patient's family history includes CAD (age of onset: 62) in her father; Cancer in her cousin, cousin, and maternal aunt; Diabetes in her cousin; Heart failure in her brother and sister; Hypertension in her brother; Stroke in her brother and sister.    ROS:  Please see the history of present illness.   Otherwise, review of systems are positive for none.   All other systems are reviewed and negative.    PHYSICAL EXAM: VS:  BP 130/72 (BP Location: Left Arm, Patient Position: Sitting, Cuff Size: Normal)   Pulse 78   Ht 5\' 3"  (1.6 m)   Wt 149 lb (67.6 kg)   LMP  (LMP Unknown)   BMI 26.39 kg/m  , BMI Body mass index is 26.39 kg/m. GEN: Well nourished, well developed, in no acute distress  HEENT: normal  Neck: no JVD, carotid bruits, or masses Cardiac: RRR; no murmurs, rubs, or gallops,no edema  Respiratory:  clear to auscultation bilaterally, normal  work of breathing GI: soft, nontender, nondistended, + BS MS: no deformity or atrophy  Skin: warm and dry, no rash Neuro:  Strength and sensation are intact Psych: euthymic mood, full affect   EKG:  EKG is ordered today. The ekg ordered today demonstrates sinus rhythm with ventricular paced rhythm.   Recent Labs: 11/26/2015: BUN 27; Creatinine, Ser 1.25; Potassium 4.9; Sodium 140    Lipid Panel    Component Value Date/Time   CHOL 160 06/27/2015 0841   CHOL 192 06/12/2014   TRIG 62.0 06/27/2015 0841   TRIG 70 06/12/2014   HDL 51.60 06/27/2015 0841   CHOLHDL 3 06/27/2015 0841   VLDL 12.4 06/27/2015 0841  [place] 96 06/27/2015 0841   LDLCALC 104 06/12/2014      Wt Readings from Last 3 Encounters:  02/19/16 149 lb (67.6 kg)  11/06/15 149 lb 4 oz (67.7 kg)  09/11/15 148 lb (67.1 kg)         ASSESSMENT AND PLAN:  1.  Chronic systolic heart failure: She appears to be euvolemic on current dose of Bumex. Most recent ejection fraction was 35-40%.  She reports significant worsening of exertional dyspnea of unclear etiology. I don't see clear evidence of volume overload by physical exam on her weight has been stable. She is currently [place] Heart Association class III. She was previously an class II. I elected to change losartan to [facility]. I'm going to obtain routine labs in one week to check for reversible causes. If symptoms do not improve, I will refer her to Dr. Caryl Comes to interrogate her device and see if AV optimization is needed.  2. Paroxysmal atrial fibrillation: Continue [place]-term anticoagulation with warfarin.  3. Essential hypertension: Blood pressure is reasonably controlled on current medications.  4. Atypical bilateral leg pain: Diminished distal pulses. I requested lower extremity arterial Doppler.   5. Status post mitral valve replacement with tissue valve: Function was acceptable on most recent echocardiogram although there was moderate  regurgitation.   Disposition:   FU with me in 1 months  Signed,  Kathlyn Sacramento, MD  02/19/2016 3:27 PM    [place]

## 2016-02-20 ENCOUNTER — Telehealth: Payer: Self-pay | Admitting: *Deleted

## 2016-02-20 NOTE — Telephone Encounter (Signed)
Pt requiring PA for Entresto 24-26 mg Tablet. PA has been forwarded to [place] approval.

## 2016-02-21 NOTE — Telephone Encounter (Signed)
Pt has been approved for Entresto 06/22/15-06/22/16.

## 2016-02-26 ENCOUNTER — Other Ambulatory Visit: Payer: Self-pay

## 2016-02-27 ENCOUNTER — Other Ambulatory Visit (INDEPENDENT_AMBULATORY_CARE_PROVIDER_SITE_OTHER): Payer: Medicare Other | Admitting: *Deleted

## 2016-02-27 ENCOUNTER — Ambulatory Visit (INDEPENDENT_AMBULATORY_CARE_PROVIDER_SITE_OTHER): Payer: Medicare Other

## 2016-02-27 DIAGNOSIS — Z5181 Encounter for therapeutic drug level monitoring: Secondary | ICD-10-CM

## 2016-02-27 DIAGNOSIS — Z953 Presence of xenogenic heart valve: Secondary | ICD-10-CM | POA: Diagnosis not present

## 2016-02-27 DIAGNOSIS — I4891 Unspecified atrial fibrillation: Secondary | ICD-10-CM | POA: Diagnosis not present

## 2016-02-27 DIAGNOSIS — R0602 Shortness of breath: Secondary | ICD-10-CM | POA: Diagnosis not present

## 2016-02-27 LAB — POCT INR: INR: 3.8

## 2016-02-28 ENCOUNTER — Other Ambulatory Visit: Payer: Self-pay | Admitting: Cardiovascular Disease

## 2016-02-28 DIAGNOSIS — I739 Peripheral vascular disease, unspecified: Secondary | ICD-10-CM

## 2016-02-28 LAB — BASIC METABOLIC PANEL
BUN / CREAT RATIO: 22 (ref 12–28)
BUN: 32 mg/dL — AB (ref 8–27)
CHLORIDE: 104 mmol/L (ref 96–106)
CO2: 23 mmol/L (ref 18–29)
CREATININE: 1.43 mg/dL — AB (ref 0.57–1.00)
Calcium: 9.4 mg/dL (ref 8.7–10.3)
GFR calc Af Amer: 42 mL/min/{1.73_m2} — ABNORMAL LOW (ref >59)
GFR calc non Af Amer: 36 mL/min/{1.73_m2} — ABNORMAL LOW (ref >59)
GLUCOSE: 109 mg/dL — AB (ref 65–99)
Potassium: 4.5 mmol/L (ref 3.5–5.2)
SODIUM: 144 mmol/L (ref 134–144)

## 2016-02-28 LAB — CBC
Hematocrit: 39.3 % (ref 34.0–46.6)
Hemoglobin: 13.5 g/dL (ref 11.1–15.9)
MCH: 29.3 pg (ref 26.6–33.0)
MCHC: 34.4 g/dL (ref 31.5–35.7)
MCV: 85 fL (ref 79–97)
PLATELETS: 241 10*3/uL (ref 150–379)
RBC: 4.6 x10E6/uL (ref 3.77–5.28)
RDW: 14.5 % (ref 12.3–15.4)
WBC: 5.9 10*3/uL (ref 3.4–10.8)

## 2016-02-28 LAB — TSH: TSH: 1.69 u[IU]/mL (ref 0.450–4.500)

## 2016-02-28 LAB — BRAIN NATRIURETIC PEPTIDE: BNP: 164.7 pg/mL — ABNORMAL HIGH (ref 0.0–100.0)

## 2016-03-10 ENCOUNTER — Ambulatory Visit: Payer: Self-pay | Admitting: Cardiovascular Disease

## 2016-03-10 ENCOUNTER — Ambulatory Visit: Payer: Medicare Other

## 2016-03-10 DIAGNOSIS — M79605 Pain in left leg: Principal | ICD-10-CM

## 2016-03-10 DIAGNOSIS — I739 Peripheral vascular disease, unspecified: Secondary | ICD-10-CM

## 2016-03-10 DIAGNOSIS — R0989 Other specified symptoms and signs involving the circulatory and respiratory systems: Secondary | ICD-10-CM | POA: Diagnosis not present

## 2016-03-10 DIAGNOSIS — R209 Unspecified disturbances of skin sensation: Secondary | ICD-10-CM | POA: Diagnosis not present

## 2016-03-10 DIAGNOSIS — M79604 Pain in right leg: Secondary | ICD-10-CM

## 2016-03-12 ENCOUNTER — Ambulatory Visit (INDEPENDENT_AMBULATORY_CARE_PROVIDER_SITE_OTHER): Payer: Medicare Other | Admitting: *Deleted

## 2016-03-12 DIAGNOSIS — I4891 Unspecified atrial fibrillation: Secondary | ICD-10-CM

## 2016-03-12 DIAGNOSIS — Z5181 Encounter for therapeutic drug level monitoring: Secondary | ICD-10-CM | POA: Diagnosis not present

## 2016-03-12 DIAGNOSIS — Z953 Presence of xenogenic heart valve: Secondary | ICD-10-CM

## 2016-03-12 LAB — POCT INR: INR: 2.9

## 2016-03-18 ENCOUNTER — Encounter: Payer: Self-pay | Admitting: Cardiovascular Disease

## 2016-03-18 ENCOUNTER — Ambulatory Visit (INDEPENDENT_AMBULATORY_CARE_PROVIDER_SITE_OTHER): Payer: Medicare Other | Admitting: Cardiovascular Disease

## 2016-03-18 VITALS — BP 102/62 | HR 78 | Ht 63.0 in | Wt 148.2 lb

## 2016-03-18 DIAGNOSIS — I639 Cerebral infarction, unspecified: Secondary | ICD-10-CM

## 2016-03-18 DIAGNOSIS — R0600 Dyspnea, unspecified: Secondary | ICD-10-CM | POA: Diagnosis not present

## 2016-03-18 DIAGNOSIS — I4891 Unspecified atrial fibrillation: Secondary | ICD-10-CM

## 2016-03-18 DIAGNOSIS — I10 Essential (primary) hypertension: Secondary | ICD-10-CM

## 2016-03-18 DIAGNOSIS — I5022 Chronic systolic (congestive) heart failure: Secondary | ICD-10-CM | POA: Diagnosis not present

## 2016-03-18 DIAGNOSIS — Z953 Presence of xenogenic heart valve: Secondary | ICD-10-CM

## 2016-03-18 MED ORDER — FUROSEMIDE 20 MG PO TABS: 20.0000 mg | ORAL_TABLET | Freq: Every day | ORAL | 3 refills | Status: DC

## 2016-03-18 NOTE — Progress Notes (Signed)
Cardiology Office Note   Date:  03/18/2016   ID:  Elizabeth Hall Nov 26, 1940, MRN [id number]  PCP:  Elizabeth Bush, MD  Cardiologist:   Elizabeth Sacramento, MD   Chief Complaint  Patient presents with  . other    F/u testing and labs. Meds reviewed verbally with pt.      History of Present Illness: Elizabeth Hall is a 75 y.o. female who presents for  a follow-up visit. 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 Congestive heart failure was diagnosed in 2007.  She has known history of atrial fibrillation. She underwent AV nodal ablation and pacemaker placement in November 2013. 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 Her ejection fraction deteriorated and she underwent explantation of the previous pacemaker and implantation of biventricular ICD in March 2015. She had CVA in the past when her warfarin was held for a procedure. Since then, she has been bridged with low molecular weight heparin. She had recent worsening of exertional dyspnea. Echocardiogram showed an ejection fraction of 123456, grade 2 diastolic dysfunction, replaced mitral valve with moderate regurgitation and a mean gradient of 5 mmHg. Left atrium was severely dilated. Pulmonary pressure could not be estimated. During last visit, I switched losartan to Entresto. She reports some improvement in symptoms but she is not back to baseline. She denies any chest pain. She complained of bilateral leg pain. ABI was normal. Labs showed only mildly elevated BNP at 160. TSH and CBC were both normal. Creatinine was mildly elevated at 1.43 with a BUN of 32.   Past Medical History:  Diagnosis Date  . Atrial fibrillation ([place])   . Bleeding of eye   . CAD (coronary artery disease) 03/09/2000   s/p MI 2001  . Chronic systolic heart failure ([place])     . CKD (chronic kidney disease) stage 3, GFR 30-59 ml/min    baseline Cr 1.5-2; prior saw nephrologist thought hypertensive nephropathy/renovascular disease  . CVA (cerebral infarction) 2014   occipital lobe - some vision loss when coumadin held and not bridged  . Dyslipidemia   . Glaucoma   . H. pylori infection    treated  . 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 Hx of rheumatic fever   . Hypertension   . Melanoma ([place]) 2006   L shoulder  . Phlebitis   . Systolic CHF ([place])    EF [id number] s/p MVR and TVA 2013  . 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 ABLATION  2013  . ATHERECTOMY  06/2012   left arm after coumadin held without bridging  . CATARACT EXTRACTION Bilateral 2007, 2010  . COLONOSCOPY  03/2014   diverticulosis, int hem, rpt 5 yrs for h/o polyps ([place])  . defibrillator     MEDTRONIC 5086 MRI 52 CM LEAD, SERIAL # [id number]  . DG ABDOMEN COMPLETE ([place] [facility])  02/2013   HH, mod severe erosive gastritis, duodenitis  . GALLBLADDER SURGERY  2006  . INSERT / REPLACE / REMOVE PACEMAKER  08/2013  . MITRAL VALVE REPLACEMENT  05/2012  . TOTAL ABDOMINAL HYSTERECTOMY W/ BILATERAL SALPINGOOPHORECTOMY  1991   irregular bleeding  .  TRICUSPID VALVE REPLACEMENT  05/2012  . US ECHOCARDIOGRAPHY  02/2014   EF 40%, LA marked dilation, gen LV hypokinesis, mitral prosthetic valve     Current Outpatient Prescriptions  Medication Sig Dispense Refill  . aspirin 81 MG tablet Take 81 mg by mouth daily.    . bimatoprost (LUMIGAN) 0.03 % ophthalmic solution Place 1 drop into both eyes at bedtime.    . bumetanide (BUMEX) 2 MG tablet Take 1 tablet (2 mg total) by mouth daily. 90 tablet 3  . carvedilol (COREG) 12.5 MG tablet Take 1 tablet (12.5 mg total) by mouth 2 (two) times daily with a meal.  180 tablet 3  . estradiol (ESTRACE) 1 MG tablet Take 0.5 mg by mouth daily.    [name] MAGNESIUM PO Take by mouth daily.    . meclizine (ANTIVERT) 25 MG tablet Take 1 tablet (25 mg total) by mouth 2 (two) times daily as needed for dizziness. 60 tablet 2  . sacubitril-valsartan (ENTRESTO) 24-26 MG Take 1 tablet by mouth 2 (two) times daily. 60 tablet 3  . spironolactone (ALDACTONE) 25 MG tablet TAKE 1 TABLET(25 MG) BY MOUTH DAILY (Patient taking differently: TAKE 1/2 TABLET(25 MG) BY MOUTH DAILY) 30 tablet 3  . VITAMIN E PO Take by mouth daily.    [name] warfarin (COUMADIN) 3 MG tablet TAKE 1 TABLET BY MOUTH AS DIRECTED 100 tablet 1   No current facility-administered medications for this visit.     Allergies:   Amiodarone; Phytonadione [vitamin k1]; Biaxin [clarithromycin]; Ciprofloxacin; Flagyl [metronidazole]; and Lisinopril    Social History:  The patient  reports that she has quit smoking. Her smoking use included Cigarettes. She has never used smokeless tobacco. She reports that she drinks alcohol. She reports that she does not use drugs.   Family History:  The patient's family history includes CAD (age of onset: 81) in her father; Cancer in her cousin, cousin, and maternal aunt; Diabetes in her cousin; Heart failure in her brother and sister; Hypertension in her brother; Stroke in her brother and sister.    ROS:  Please see the history of present illness.   Otherwise, review of systems are positive for none.   All other systems are reviewed and negative.    PHYSICAL EXAM: VS:  BP 102/62 (BP Location: Left Arm, Patient Position: Sitting, Cuff Size: Normal)   Pulse 78   Ht 5\' 3"  (1.6 m)   Wt 148 lb 4 oz (67.2 kg)   LMP  (LMP Unknown)   BMI 26.26 kg/m  , BMI Body mass index is 26.26 kg/m. GEN: Well nourished, well developed, in no acute distress  HEENT: normal  Neck: no JVD, carotid bruits, or masses Cardiac: RRR; no murmurs, rubs, or gallops,no edema  Respiratory:  clear to auscultation  bilaterally, normal work of breathing GI: soft, nontender, nondistended, + BS MS: no deformity or atrophy  Skin: warm and dry, no rash Neuro:  Strength and sensation are intact Psych: euthymic mood, full affect   EKG:  EKG  not ordered today. Ventricular paced rhythm with underlying sinus rhythm with prolonged PR interval.   Recent Labs: 02/27/2016: BNP 164.7; BUN 32; Creatinine, Ser 1.43; Platelets 241; Potassium 4.5; Sodium 144; TSH 1.690    Lipid Panel    Component Value Date/Time   CHOL 160 06/27/2015 0841   CHOL 192 06/12/2014   TRIG 62.0 06/27/2015 0841   TRIG 70 06/12/2014   HDL 51.60 06/27/2015 0841   CHOLHDL 3 06/27/2015 0841   VLDL 12.4  06/27/2015 0841   LDLCALC 96 06/27/2015 0841   LDLCALC 104 06/12/2014      Wt Readings from Last 3 Encounters:  03/18/16 148 lb 4 oz (67.2 kg)  02/19/16 149 lb (67.6 kg)  11/06/15 149 lb 4 oz (67.7 kg)         ASSESSMENT AND PLAN:  1.  Chronic systolic heart failure:  Most recent ejection fraction was 35-40%.  She reports significant worsening of exertional dyspnea of unclear etiology. I don't see clear evidence of volume overload by physical exam on her weight has been stable. Recent worsening of exertional dyspnea. Some improvement after switching losartan to Entresto. I elected to switch Bumex to furosemide 20 mg once daily. Check basic metabolic profile in one week. She continues to have exertional dyspnea and thus I requested a pharmacologic nuclear stress test to ensure no ischemia.  2. Paroxysmal atrial fibrillation: Continue [place]-term anticoagulation with warfarin.  3. Essential hypertension: Blood pressure is reasonably controlled on current medications.  4. Atypical bilateral leg pain: normal ABI  5. Status post mitral valve replacement with bioprosthetic valve: Function was acceptable on most recent echocardiogram although there was moderate regurgitation.   Disposition:   FU with me in 3  months  Signed,  Elizabeth Sacramento, MD  03/18/2016 4:06 PM     [facility]

## 2016-03-18 NOTE — Patient Instructions (Addendum)
Medication Instructions:  Your physician has recommended you make the following change in your medication:  STOP taking bumex START taking lasix 20mg  once daily   Labwork: BMET in one week  Testing/Procedures: Your physician has requested that you have a lexiscan myoview. For further information please visit [email or web address]. Please follow instruction sheet, as given.  [place]  Your caregiver has ordered a Stress Test with nuclear imaging. The purpose of this test is to evaluate the blood supply to your heart muscle. This procedure is referred to as a "Non-Invasive Stress Test." This is because other than having an IV started in your vein, nothing is inserted or "invades" your body. Cardiac stress tests are done to find areas of poor blood flow to the heart by determining the extent of coronary artery disease (CAD). Some patients exercise on a treadmill, which naturally increases the blood flow to your heart, while others who are  unable to walk on a treadmill due to physical limitations have a pharmacologic/chemical stress agent called Lexiscan . This medicine will mimic walking on a treadmill by temporarily increasing your coronary blood flow.   Please note: these test may take anywhere between 2-4 hours to complete  PLEASE REPORT TO [facility] AT THE FIRST DESK WILL DIRECT YOU WHERE TO GO  Date of Procedure:__Thursday, Oct 5_____  Arrival Time for Procedure:___8:45am_______  Instructions regarding medication:     __xx__:  Hold coreg the night before procedure and morning of procedure    PLEASE NOTIFY THE OFFICE AT LEAST 24 HOURS IN ADVANCE IF YOU ARE UNABLE TO KEEP YOUR APPOINTMENT.  [phone] AND  PLEASE NOTIFY NUCLEAR MEDICINE AT [facility] AT LEAST 24 HOURS IN ADVANCE IF YOU ARE UNABLE TO KEEP YOUR APPOINTMENT. [phone]  How to prepare for your Myoview test:  1. Do not eat or drink after midnight 2. No caffeine for 24 hours prior  to test 3. No smoking 24 hours prior to test. 4. Your medication may be taken with water.  If your doctor stopped a medication because of this test, do not take that medication. 5. Ladies, please do not wear dresses.  Skirts or pants are appropriate. Please wear a short sleeve shirt. 6. No perfume, cologne or lotion. 7. Wear comfortable walking shoes. No heels!            Follow-Up: Your physician recommends that you schedule a follow-up appointment in: 3 months with Dr. Fletcher Anon.    Any Other Special Instructions Will Be Listed Below (If Applicable).     If you need a refill on your cardiac medications before your next appointment, please call your pharmacy.  Cardiac Nuclear Scanning A cardiac nuclear scan is used to check your heart for problems, such as the following:  A portion of the heart is not getting enough blood.  Part of the heart muscle has died, which happens with a heart attack.  The heart wall is not working normally.  In this test, a radioactive dye (tracer) is injected into your bloodstream. After the tracer has traveled to your heart, a scanning device is used to measure how much of the tracer is absorbed by or distributed to various areas of your heart. LET [facility] CARE PROVIDER KNOW ABOUT:  Any allergies you have.  All medicines you are taking, including vitamins, herbs, eye drops, creams, and over-the-counter medicines.  Previous problems you or members of your family have had with the use of anesthetics.  Any blood disorders you  have.  Previous surgeries you have had.  Medical conditions you have.  RISKS AND COMPLICATIONS Generally, this is a safe procedure. However, as with any procedure, problems can occur. Possible problems include:   Serious chest pain.  Rapid heartbeat.  Sensation of warmth in your chest. This usually passes quickly. BEFORE THE PROCEDURE Ask your health care provider about changing or stopping your regular  medicines. PROCEDURE This procedure is usually done at a hospital and takes 2-4 hours.  An IV tube is inserted into one of your veins.  Your health care provider will inject a small amount of radioactive tracer through the tube.  You will then wait for 20-40 minutes while the tracer travels through your bloodstream.  You will lie down on an exam table so images of your heart can be taken. Images will be taken for about 15-20 minutes.  You will exercise on a treadmill or stationary bike. While you exercise, your heart activity will be monitored with an electrocardiogram (ECG), and your blood pressure will be checked.  If you are unable to exercise, you may be given a medicine to make your heart beat faster.  When blood flow to your heart has peaked, tracer will again be injected through the IV tube.  After 20-40 minutes, you will get back on the exam table and have more images taken of your heart.  When the procedure is over, your IV tube will be removed. AFTER THE PROCEDURE  You will likely be able to leave shortly after the test. Unless your health care provider tells you otherwise, you may return to your normal schedule, including diet, activities, and medicines.  Make sure you find out how and when you will get your test results.   This information is not intended to replace advice given to you by your health care provider. Make sure you discuss any questions you have with your health care provider.   Document Released: 07/04/2004 Document Revised: 06/14/2013 Document Reviewed: 05/18/2013 Elsevier Interactive Patient Education [employer].

## 2016-03-21 ENCOUNTER — Ambulatory Visit: Payer: Self-pay

## 2016-03-23 HISTORY — PX: CARDIOVASCULAR STRESS TEST: SHX262

## 2016-03-25 ENCOUNTER — Ambulatory Visit (INDEPENDENT_AMBULATORY_CARE_PROVIDER_SITE_OTHER): Payer: Medicare Other

## 2016-03-25 DIAGNOSIS — Z23 Encounter for immunization: Secondary | ICD-10-CM

## 2016-03-26 ENCOUNTER — Other Ambulatory Visit (INDEPENDENT_AMBULATORY_CARE_PROVIDER_SITE_OTHER): Payer: Medicare Other | Admitting: *Deleted

## 2016-03-26 ENCOUNTER — Telehealth: Payer: Self-pay | Admitting: Cardiovascular Disease

## 2016-03-26 DIAGNOSIS — I4891 Unspecified atrial fibrillation: Secondary | ICD-10-CM

## 2016-03-26 DIAGNOSIS — I5022 Chronic systolic (congestive) heart failure: Secondary | ICD-10-CM | POA: Diagnosis not present

## 2016-03-26 NOTE — Telephone Encounter (Addendum)
Reviewed myoview instructions w/pt who verbalized understanding. She reports continued leg pain. ABI normal. She is on her feet most of the day at [employer]. She would like a letter stating she needs to sit on a stool 15 minutes every hour while at work.

## 2016-03-27 ENCOUNTER — Encounter
Admission: RE | Admit: 2016-03-27 | Discharge: 2016-03-27 | Disposition: A | Discharge status: Home / Self Care | Payer: Medicare Other | Source: Ambulatory Visit | Attending: Cardiovascular Disease | Admitting: Cardiovascular Disease

## 2016-03-27 DIAGNOSIS — R0600 Dyspnea, unspecified: Secondary | ICD-10-CM

## 2016-03-27 LAB — BASIC METABOLIC PANEL
BUN / CREAT RATIO: 20 (ref 12–28)
BUN: 26 mg/dL (ref 8–27)
CALCIUM: 9.3 mg/dL (ref 8.7–10.3)
CHLORIDE: 105 mmol/L (ref 96–106)
CO2: 20 mmol/L (ref 18–29)
Creatinine, Ser: 1.31 mg/dL — ABNORMAL HIGH (ref 0.57–1.00)
GFR calc Af Amer: 47 mL/min/{1.73_m2} — ABNORMAL LOW (ref >59)
GFR calc non Af Amer: 40 mL/min/{1.73_m2} — ABNORMAL LOW (ref >59)
GLUCOSE: 97 mg/dL (ref 65–99)
Potassium: 4.6 mmol/L (ref 3.5–5.2)
Sodium: 142 mmol/L (ref 134–144)

## 2016-03-27 LAB — NM MYOCAR MULTI W/SPECT W/WALL MOTION / EF
CHL CUP NUCLEAR SDS: 2
CHL CUP NUCLEAR SRS: 13
CHL CUP RESTING HR STRESS: 81 {beats}/min
CHL CUP STRESS STAGE 1 SPEED: 0 mph
CHL CUP STRESS STAGE 3 HR: 75 {beats}/min
CHL CUP STRESS STAGE 4 GRADE: 0 %
CHL CUP STRESS STAGE 4 SPEED: 0 mph
CSEPEW: 1 METS
CSEPHR: 52 %
CSEPPHR: 75 {beats}/min
CSEPPMHR: 51 %
LV dias vol: 86 mL (ref 46–106)
LVSYSVOL: 45 mL
SSS: 11
Stage 1 Grade: 0 %
Stage 1 HR: 75 {beats}/min
Stage 2 Grade: 0 %
Stage 2 HR: 75 {beats}/min
Stage 2 Speed: 0 mph
Stage 3 Grade: 0 %
Stage 3 Speed: 0 mph
Stage 4 HR: 75 {beats}/min
Stage 5 DBP: 63 mmHg
Stage 5 Grade: 0 %
Stage 5 HR: 75 {beats}/min
Stage 5 SBP: 138 mmHg
Stage 5 Speed: 0 mph
TID: 1.05

## 2016-03-27 MED ORDER — REGADENOSON 0.4 MG/5ML IV SOLN
0.4000 mg | Freq: Once | INTRAVENOUS | Status: AC
  Administered 2016-03-27: 0.4 mg via INTRAVENOUS

## 2016-03-27 MED ORDER — TECHNETIUM TC 99M TETROFOSMIN IV KIT
29.7810 | PACK | Freq: Once | INTRAVENOUS | Status: AC | PRN
  Administered 2016-03-27: 29.781 via INTRAVENOUS

## 2016-03-27 MED ORDER — TECHNETIUM TC 99M TETROFOSMIN IV KIT
13.0000 | PACK | Freq: Once | INTRAVENOUS | Status: AC | PRN
  Administered 2016-03-27: 13.37 via INTRAVENOUS

## 2016-03-27 NOTE — Telephone Encounter (Addendum)
Dr. Fletcher Anon verbally agreed w/plan. Letter printed and placed at front desk for pick up. Pt notified.

## 2016-04-02 ENCOUNTER — Ambulatory Visit (INDEPENDENT_AMBULATORY_CARE_PROVIDER_SITE_OTHER): Payer: Medicare Other

## 2016-04-02 DIAGNOSIS — I4891 Unspecified atrial fibrillation: Secondary | ICD-10-CM | POA: Diagnosis not present

## 2016-04-02 DIAGNOSIS — Z5181 Encounter for therapeutic drug level monitoring: Secondary | ICD-10-CM

## 2016-04-02 DIAGNOSIS — Z953 Presence of xenogenic heart valve: Secondary | ICD-10-CM

## 2016-04-02 LAB — POCT INR: INR: 2.1

## 2016-04-09 ENCOUNTER — Encounter: Payer: Self-pay | Admitting: Family Medicine

## 2016-04-15 DIAGNOSIS — H401233 Low-tension glaucoma, bilateral, severe stage: Secondary | ICD-10-CM | POA: Diagnosis not present

## 2016-04-21 ENCOUNTER — Telehealth: Payer: Self-pay | Admitting: Cardiovascular Disease

## 2016-04-21 NOTE — Telephone Encounter (Signed)
Placed Entresto 24-26 mg upfront for pick up.

## 2016-04-21 NOTE — Telephone Encounter (Signed)
Patient calling the office for samples of medication:   1.  What medication and dosage are you requesting samples for? Entresto 24-26 mg  2.  Are you currently out of this medication?  Has only a few left.    Also would like to know how [place] would it take for the medication to start showing results.  Please advise.

## 2016-04-23 ENCOUNTER — Ambulatory Visit (INDEPENDENT_AMBULATORY_CARE_PROVIDER_SITE_OTHER): Payer: Medicare Other

## 2016-04-23 DIAGNOSIS — I4891 Unspecified atrial fibrillation: Secondary | ICD-10-CM

## 2016-04-23 DIAGNOSIS — Z5181 Encounter for therapeutic drug level monitoring: Secondary | ICD-10-CM | POA: Diagnosis not present

## 2016-04-23 DIAGNOSIS — Z953 Presence of xenogenic heart valve: Secondary | ICD-10-CM

## 2016-04-23 LAB — POCT INR: INR: 2.6

## 2016-05-06 ENCOUNTER — Ambulatory Visit (INDEPENDENT_AMBULATORY_CARE_PROVIDER_SITE_OTHER): Payer: Medicare Other | Admitting: *Deleted

## 2016-05-06 DIAGNOSIS — I5022 Chronic systolic (congestive) heart failure: Secondary | ICD-10-CM

## 2016-05-06 DIAGNOSIS — Z95810 Presence of automatic (implantable) cardiac defibrillator: Secondary | ICD-10-CM

## 2016-05-06 NOTE — Progress Notes (Signed)
Remote ICD transmission.   

## 2016-05-12 ENCOUNTER — Telehealth: Payer: Self-pay | Admitting: Cardiovascular Disease

## 2016-05-12 NOTE — Telephone Encounter (Signed)
Entresto 24-26 samples at front desk for pick up.

## 2016-05-12 NOTE — Telephone Encounter (Signed)
Patient calling the office for samples of medication:   1.  What medication and dosage are you requesting samples for? Entresto 24-26 mg po BID   2.  Are you currently out of this medication?  Will be out Wednesday

## 2016-05-17 ENCOUNTER — Other Ambulatory Visit: Payer: Self-pay | Admitting: Cardiovascular Disease

## 2016-05-28 ENCOUNTER — Ambulatory Visit (INDEPENDENT_AMBULATORY_CARE_PROVIDER_SITE_OTHER): Payer: Medicare Other

## 2016-05-28 DIAGNOSIS — I4891 Unspecified atrial fibrillation: Secondary | ICD-10-CM | POA: Diagnosis not present

## 2016-05-28 DIAGNOSIS — Z953 Presence of xenogenic heart valve: Secondary | ICD-10-CM

## 2016-05-28 DIAGNOSIS — Z5181 Encounter for therapeutic drug level monitoring: Secondary | ICD-10-CM

## 2016-05-28 DIAGNOSIS — I639 Cerebral infarction, unspecified: Secondary | ICD-10-CM

## 2016-05-28 LAB — POCT INR: INR: 2.6

## 2016-05-29 ENCOUNTER — Ambulatory Visit (INDEPENDENT_AMBULATORY_CARE_PROVIDER_SITE_OTHER): Payer: Medicare Other | Admitting: Family Medicine

## 2016-05-29 ENCOUNTER — Encounter: Payer: Self-pay | Admitting: Family Medicine

## 2016-05-29 VITALS — BP 108/64 | HR 72 | Temp 97.6°F | Wt 151.5 lb

## 2016-05-29 DIAGNOSIS — I639 Cerebral infarction, unspecified: Secondary | ICD-10-CM | POA: Diagnosis not present

## 2016-05-29 DIAGNOSIS — R1084 Generalized abdominal pain: Secondary | ICD-10-CM

## 2016-05-29 DIAGNOSIS — R197 Diarrhea, unspecified: Secondary | ICD-10-CM | POA: Diagnosis not present

## 2016-05-29 NOTE — Progress Notes (Signed)
Subjective:    Patient ID: Elizabeth Hall, female    DOB: Nov 19, 1940, 75 y.o.   MRN: [id number]  HPI This is a 75 yo female who presents today with abdominal pain and diarrhea.  Five days ago she woke up with stomach pain and was incontinent of yellow, watery stool. Currently with 5-6 loose stools a day, small amount of liquid. Pain was constant but comes and goes. Worse with walking. No nausea or vomiting. Feels bloated. No fever, has had some intermittent chills. Occasionally legs feel "like noodles." Bowel movement with urination. No dysuria, no hematuria, no frequency. Able to eat and drink normally, has felt hungry. Started Delene Loll a couple of months ago, otherwise on stable medications. No recent antibiotic use, no sick contacts. Had appendectomy in 1991. Takes one acetaminophen at night.  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 Worked 6 hours yesterday. Yesterday was worse day.   Past Medical History:  Diagnosis Date  . Atrial fibrillation ([place])   . Bleeding of eye   . CAD (coronary artery disease) 03/09/2000   s/p MI 2001  . Chronic systolic heart failure ([place])   . CKD (chronic kidney disease) stage 3, GFR 30-59 ml/min    baseline Cr 1.5-2; prior saw nephrologist thought hypertensive nephropathy/renovascular disease  . CVA (cerebral infarction) 2014   occipital lobe - some vision loss when coumadin held and not bridged  . Dyslipidemia   . Glaucoma   . H. pylori infection    treated  . 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 Hx of rheumatic fever   . Hypertension   . Melanoma ([place]) 2006   L shoulder  . Phlebitis   . Systolic CHF ([place])    EF [id number] s/p MVR and TVA 2013  . 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 ABLATION   2013  . ATHERECTOMY  06/2012   left arm after coumadin held without bridging  . CARDIOVASCULAR STRESS TEST  03/2016   WNL  . CATARACT EXTRACTION Bilateral 2007, 2010  . COLONOSCOPY  03/2014   diverticulosis, int hem, rpt 5 yrs for h/o polyps ([place])  . defibrillator     MEDTRONIC 5086 MRI 52 CM LEAD, SERIAL # LFP [id number]  . DG ABDOMEN COMPLETE ([place] HX)  02/2013   HH, mod severe erosive gastritis, duodenitis  . GALLBLADDER SURGERY  2006  . INSERT / REPLACE / REMOVE PACEMAKER  08/2013  . MITRAL VALVE REPLACEMENT  05/2012  . TOTAL ABDOMINAL HYSTERECTOMY W/ BILATERAL SALPINGOOPHORECTOMY  1991   irregular bleeding  . TRICUSPID VALVE REPLACEMENT  05/2012  . US ECHOCARDIOGRAPHY  02/2014   EF 40%, LA marked dilation, gen LV hypokinesis, mitral prosthetic valve   Family History  Problem Relation Age of Onset  . CAD Father 46    MI  . Hypertension Brother   . Heart failure Brother     CHF with pacemaker  . Stroke Brother   . Heart failure Sister     CHF  . Stroke Sister   . Diabetes Cousin   . Cancer Maternal Aunt     colon  . Cancer Cousin     breast  . Cancer Cousin     brain tumor   Social History  Substance  Use Topics  . Smoking status: Former Smoker    Types: Cigarettes  . Smokeless tobacco: Never Used  . Alcohol use 0.0 oz/week     Comment: occasional      Review of Systems Per HPI    Objective:   Physical Exam  Constitutional: She is oriented to person, place, and time. She appears well-developed and well-nourished. No distress.  HENT:  Head: Normocephalic and atraumatic.  Eyes: Conjunctivae are normal.  Cardiovascular: Normal rate, regular rhythm and normal heart sounds.   Pulmonary/Chest: Effort normal and breath sounds normal.  Abdominal: Soft. Bowel sounds are increased. There is generalized tenderness. There is no rebound and no guarding.  Musculoskeletal: She exhibits no edema.  Neurological: She is alert and oriented to person, place, and time.  Skin:  Skin is warm and dry. She is not diaphoretic.  Psychiatric: She has a normal mood and affect. Her behavior is normal. Judgment and thought content normal.  Vitals reviewed.     BP 108/64   Pulse 72   Temp 97.6 F (36.4 C) (Oral)   Wt 151 lb 8 oz (68.7 kg)   LMP  (LMP Unknown)   BMI 26.84 kg/m  Wt Readings from Last 3 Encounters:  05/29/16 151 lb 8 oz (68.7 kg)  03/18/16 148 lb 4 oz (67.2 kg)  02/19/16 149 lb (67.6 kg)       Assessment & Plan:  1. Generalized abdominal pain - try heat, Tylenol - RTC/ER precautions reviewed- fever, worsening pain, vomiting  2. Diarrhea, unspecified type - can try small amounts Immodium, bland diet - not currently hypovolemic, will wait on stool studies before prescribing antibiotic - Stool culture - C. difficile GDH and Toxin A/B   Clarene Reamer, FNP-BC  [facility], [place]  05/29/2016 8:43 AM

## 2016-05-29 NOTE — Progress Notes (Signed)
Pre visit review using our clinic review tool, if applicable. No additional management support is needed unless otherwise documented below in the visit note. 

## 2016-05-29 NOTE — Patient Instructions (Signed)
You can take 2-3 Immodium daily to see if diarrhea will decrease Drink enough liquids to make your urine light yellow Tylenol 2 tablets twice a day as needed for pain, can also try heating pad Please send me a MyChart message tomorrow to let me know how you are doing  [facility] Diet Introduction A bland diet consists of foods that do not have a lot of fat or fiber. Foods without fat or fiber are easier for the body to digest. They are also less likely to irritate your mouth, throat, stomach, and other parts of your gastrointestinal tract. A bland diet is sometimes called a BRAT diet. What is my plan? Your health care provider or dietitian may recommend specific changes to your diet to prevent and treat your symptoms, such as:  Eating small meals often.  Cooking food until it is soft enough to chew easily.  Chewing your food well.  Drinking fluids slowly.  Not eating foods that are very spicy, sour, or fatty.  Not eating citrus fruits, such as oranges and grapefruit. What do I need to know about this diet?  Eat a variety of foods from the bland diet food list.  Do not follow a bland diet longer than you have to.  Ask your health care provider whether you should take vitamins. What foods can I eat? Grains  Hot cereals, such as cream of wheat. Bread, crackers, or tortillas made from refined white flour. Rice. Vegetables  Canned or cooked vegetables. Mashed or boiled potatoes. Fruits  Bananas. Applesauce. Other types of cooked or canned fruit with the skin and seeds removed, such as canned peaches or pears. Meats and Other Protein Sources  Scrambled eggs. Creamy peanut butter or other nut butters. Lean, well-cooked meats, such as chicken or fish. Tofu. Soups or broths. Dairy  Low-fat dairy products, such as milk, cottage cheese, or yogurt. Beverages  Water. Herbal tea. Apple juice. Sweets and Desserts  Pudding. Custard. Fruit gelatin. Ice cream. Fats and Oils  Mild salad  dressings. Canola or olive oil. The items listed above may not be a complete list of allowed foods or beverages. Contact your dietitian for more options.  What foods are not recommended? Foods and ingredients that are often not recommended include:  Spicy foods, such as hot sauce or salsa.  Fried foods.  Sour foods, such as pickled or fermented foods.  Raw vegetables or fruits, especially citrus or berries.  Caffeinated drinks.  Alcohol.  Strongly flavored seasonings or condiments. The items listed above may not be a complete list of foods and beverages that are not allowed. Contact your dietitian for more information.  This information is not intended to replace advice given to you by your health care provider. Make sure you discuss any questions you have with your health care provider. Document Released: 10/01/2015 Document Revised: 11/15/2015 Document Reviewed: 06/21/2014  2017 Elsevier

## 2016-05-30 ENCOUNTER — Encounter: Payer: Self-pay | Admitting: Family Medicine

## 2016-05-30 LAB — C. DIFFICILE GDH AND TOXIN A/B
C. DIFFICILE GDH: NOT DETECTED
C. difficile Toxin A/B: NOT DETECTED

## 2016-05-31 LAB — CUP PACEART REMOTE DEVICE CHECK
Battery Remaining Percentage: 82 %
Battery Voltage: 2.99 V
HIGH POWER IMPEDANCE MEASURED VALUE: 51 Ohm
Implantable Lead Location: 753858
Implantable Pulse Generator Implant Date: 20150316
Lead Channel Impedance Value: 535 Ohm
Lead Channel Pacing Threshold Pulse Width: 0.4 ms
Lead Channel Setting Pacing Amplitude: 2.5 V
Lead Channel Setting Pacing Pulse Width: 0.4 ms
Lead Channel Setting Pacing Pulse Width: 0.4 ms
Lead Channel Setting Sensing Sensitivity: 0.8 mV
Lead Channel Setting Sensing Sensitivity: 1.6 mV
MDC IDC LEAD IMPLANT DT: 20150316
MDC IDC LEAD IMPLANT DT: 20150316
MDC IDC LEAD LOCATION: 753860
MDC IDC LEAD SERIAL: 25130583
MDC IDC MSMT LEADCHNL RV IMPEDANCE VALUE: 434 Ohm
MDC IDC MSMT LEADCHNL RV PACING THRESHOLD AMPLITUDE: 0.6 V
MDC IDC SESS DTM: 20171209101646
MDC IDC SET LEADCHNL RV PACING AMPLITUDE: 2 V
Pulse Gen Model: 383547
Pulse Gen Serial Number: 60765179

## 2016-06-03 LAB — STOOL CULTURE

## 2016-06-17 ENCOUNTER — Encounter: Payer: Self-pay | Admitting: Cardiovascular Disease

## 2016-06-17 ENCOUNTER — Ambulatory Visit (INDEPENDENT_AMBULATORY_CARE_PROVIDER_SITE_OTHER): Payer: Medicare Other | Admitting: Cardiovascular Disease

## 2016-06-17 ENCOUNTER — Other Ambulatory Visit: Payer: Self-pay

## 2016-06-17 ENCOUNTER — Telehealth: Payer: Self-pay | Admitting: Cardiovascular Disease

## 2016-06-17 VITALS — BP 112/70 | HR 75 | Ht 63.0 in | Wt 152.5 lb

## 2016-06-17 DIAGNOSIS — I10 Essential (primary) hypertension: Secondary | ICD-10-CM

## 2016-06-17 DIAGNOSIS — I639 Cerebral infarction, unspecified: Secondary | ICD-10-CM

## 2016-06-17 DIAGNOSIS — I480 Paroxysmal atrial fibrillation: Secondary | ICD-10-CM

## 2016-06-17 DIAGNOSIS — I5022 Chronic systolic (congestive) heart failure: Secondary | ICD-10-CM

## 2016-06-17 DIAGNOSIS — I4891 Unspecified atrial fibrillation: Secondary | ICD-10-CM | POA: Diagnosis not present

## 2016-06-17 MED ORDER — BUMETANIDE 2 MG PO TABS
2.0000 mg | ORAL_TABLET | Freq: Every day | ORAL | 3 refills | Status: DC
Start: 2016-06-17 — End: 2016-07-18

## 2016-06-17 MED ORDER — SPIRONOLACTONE 25 MG PO TABS: ORAL_TABLET | ORAL | 3 refills | Status: DC

## 2016-06-17 MED ORDER — CARVEDILOL 12.5 MG PO TABS: 12.5000 mg | ORAL_TABLET | Freq: Two times a day (BID) | ORAL | 3 refills | Status: DC

## 2016-06-17 MED ORDER — SACUBITRIL-VALSARTAN 24-26 MG PO TABS: 1.0000 | ORAL_TABLET | Freq: Two times a day (BID) | ORAL | 3 refills | Status: DC

## 2016-06-17 MED ORDER — MECLIZINE HCL 25 MG PO TABS: 25.0000 mg | ORAL_TABLET | Freq: Two times a day (BID) | ORAL | 2 refills | Status: DC | PRN

## 2016-06-17 NOTE — Progress Notes (Signed)
Cardiology Office Note   Date:  06/17/2016   ID:  Elizabeth Hall, DOB June 22, 1941, MRN [id number]  PCP:  Ria Bush, MD  Cardiologist:   Kathlyn Sacramento, MD   Chief Complaint  Patient presents with  . other    3 month f/u c/o edema and sob. Meds reviewed verbally with pt.      History of Present Illness: Elizabeth Hall is a 75 y.o. female who presents for  a follow-up visit. 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 Congestive heart failure was diagnosed in 2007.  She has known history of atrial fibrillation. She underwent AV nodal ablation and pacemaker placement in November 2013. 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 Her ejection fraction deteriorated and she underwent explantation of the previous pacemaker and implantation of biventricular ICD in March 2015. She had CVA in the past when her warfarin was held for a procedure. Since then, she has been bridged with low molecular weight heparin.  Echocardiogram in 12/2015 showed an ejection fraction of 123456, grade 2 diastolic dysfunction, replaced mitral valve with moderate regurgitation and a mean gradient of 5 mmHg. Left atrium was severely dilated. Pulmonary pressure could not be estimated. ABI was normal in 02/2016.  She was switched from Losartan to [facility]. Bumex was switched to Furosemide. Nuclear stress test showed no evidence of ischemia with an EF of 45-54%.     She continues to complain of significant exertional dyspnea with mild leg edema and 4- 5 pounds of weight gain. She reports better symptoms when she was on Bumex. No chest pain.  Past Medical History:  Diagnosis Date  . Atrial fibrillation ([place])   . Bleeding of eye   . CAD (coronary artery disease) 03/09/2000   s/p MI 2001  . Chronic systolic heart failure ([place])   .  CKD (chronic kidney disease) stage 3, GFR 30-59 ml/min    baseline Cr 1.5-2; prior saw nephrologist thought hypertensive nephropathy/renovascular disease  . CVA (cerebral infarction) 2014   occipital lobe - some vision loss when coumadin held and not bridged  . Dyslipidemia   . Glaucoma   . H. pylori infection    treated  . 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 Hx of rheumatic fever   . Hypertension   . Melanoma ([place]) 2006   L shoulder  . Phlebitis   . Systolic CHF ([place])    EF [id number] s/p MVR and TVA 2013  . 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 ABLATION  2013  . ATHERECTOMY  06/2012   left arm after coumadin held without bridging  . CARDIOVASCULAR STRESS TEST  03/2016   WNL  . CATARACT EXTRACTION Bilateral 2007, 2010  . COLONOSCOPY  03/2014   diverticulosis, int hem, rpt 5 yrs for h/o polyps ([place])  . defibrillator     MEDTRONIC 5086 MRI 52 CM LEAD, SERIAL # [id number] [id number]  . DG ABDOMEN COMPLETE ([place] HX)  02/2013   HH, mod severe erosive gastritis, duodenitis  . GALLBLADDER SURGERY  2006  . INSERT / REPLACE / REMOVE PACEMAKER  08/2013  . MITRAL VALVE REPLACEMENT  05/2012  .  TOTAL ABDOMINAL HYSTERECTOMY W/ BILATERAL SALPINGOOPHORECTOMY  1991   irregular bleeding  . TRICUSPID VALVE REPLACEMENT  05/2012  . US ECHOCARDIOGRAPHY  02/2014   EF 40%, LA marked dilation, gen LV hypokinesis, mitral prosthetic valve     Current Outpatient Prescriptions  Medication Sig Dispense Refill  . aspirin 81 MG tablet Take 81 mg by mouth daily.    . bimatoprost (LUMIGAN) 0.03 % ophthalmic solution Place 1 drop into both eyes at bedtime.    . carvedilol (COREG) 12.5 MG tablet Take 1 tablet (12.5 mg total) by mouth 2 (two) times daily with a meal. 180 tablet 3  . estradiol (ESTRACE) 1 MG tablet  Take 0.5 mg by mouth daily.    . furosemide (LASIX) 20 MG tablet Take 1 tablet (20 mg total) by mouth daily. 30 tablet 3  . MAGNESIUM PO Take by mouth daily.    . meclizine (ANTIVERT) 25 MG tablet Take 1 tablet (25 mg total) by mouth 2 (two) times daily as needed for dizziness. 60 tablet 2  . sacubitril-valsartan (ENTRESTO) 24-26 MG Take 1 tablet by mouth 2 (two) times daily. 60 tablet 3  . spironolactone (ALDACTONE) 25 MG tablet TAKE 1 TABLET(25 MG) BY MOUTH DAILY (Patient taking differently: TAKE 1/2 TABLET(25 MG) BY MOUTH DAILY) 30 tablet 3  . VITAMIN E PO Take by mouth daily.    [name] warfarin (COUMADIN) 3 MG tablet TAKE 1 TABLET BY MOUTH AS DIRECTED 100 tablet 1   No current facility-administered medications for this visit.     Allergies:   Amiodarone; Phytonadione [vitamin k1]; Biaxin [clarithromycin]; Ciprofloxacin; Flagyl [metronidazole]; and Lisinopril    Social History:  The patient  reports that she has quit smoking. Her smoking use included Cigarettes. She has never used smokeless tobacco. She reports that she drinks alcohol. She reports that she does not use drugs.   Family History:  The patient's family history includes CAD (age of onset: 4) in her father; Cancer in her cousin, cousin, and maternal aunt; Diabetes in her cousin; Heart failure in her brother and sister; Hypertension in her brother; Stroke in her brother and sister.    ROS:  Please see the history of present illness.   Otherwise, review of systems are positive for none.   All other systems are reviewed and negative.    PHYSICAL EXAM: VS:  BP 112/70 (BP Location: Left Arm, Patient Position: Sitting, Cuff Size: Normal)   Pulse 75   Ht 5\' 3"  (1.6 m)   Wt 152 lb 8 oz (69.2 kg)   LMP  (LMP Unknown)   BMI 27.01 kg/m  , BMI Body mass index is 27.01 kg/m. GEN: Well nourished, well developed, in no acute distress  HEENT: normal  Neck: no JVD, carotid bruits, or masses Cardiac: RRR; no murmurs, rubs, or gallops,no  edema  Respiratory:  clear to auscultation bilaterally, normal work of breathing GI: soft, nontender, nondistended, + BS MS: no deformity or atrophy  Skin: warm and dry, no rash Neuro:  Strength and sensation are intact Psych: euthymic mood, full affect   EKG:  EKG  not ordered today. Ventricular paced rhythm with underlying A-fib?   Recent Labs: 02/27/2016: BNP 164.7; Platelets 241; TSH 1.690 03/26/2016: BUN 26; Creatinine, Ser 1.31; Potassium 4.6; Sodium 142    Lipid Panel    Component Value Date/Time   CHOL 160 06/27/2015 0841   CHOL 192 06/12/2014   TRIG 62.0 06/27/2015 0841   TRIG 70 06/12/2014   HDL 51.60 06/27/2015 0841  CHOLHDL 3 06/27/2015 0841   VLDL 12.4 06/27/2015 0841   LDLCALC 96 06/27/2015 0841   LDLCALC 104 06/12/2014      Wt Readings from Last 3 Encounters:  06/17/16 152 lb 8 oz (69.2 kg)  05/29/16 151 lb 8 oz (68.7 kg)  03/18/16 148 lb 4 oz (67.2 kg)         ASSESSMENT AND PLAN:  1.  Chronic systolic heart failure:  Most recent ejection fraction was 35-40%.  She is mildly volume overloaded with worsening symptoms. She is about 4 pounds above her dry weight. I elected to switch her back to Bumex 2 mg once daily. If her symptoms are not improved, we might need to consider a right and left cardiac catheterization.  2. Paroxysmal atrial fibrillation: Continue Reap-term anticoagulation with warfarin.  3. Essential hypertension: Blood pressure is well controlled on current medications.  4. Atypical bilateral leg pain: normal ABI  5. Status post mitral valve replacement with bioprosthetic valve: Function was acceptable on most recent echocardiogram although there was moderate regurgitation.   Disposition:   FU with me in 3 months  Signed,  Kathlyn Sacramento, MD  06/17/2016 11:27 AM    [place]

## 2016-06-17 NOTE — Telephone Encounter (Signed)
Conf[facility] that pt takes spironolactone 12.5mg  once daily.  Prescription submitted

## 2016-06-17 NOTE — Patient Instructions (Signed)
Medication Instructions:  Your physician has recommended you make the following change in your medication:  STOP taking lasix START taking bumex   Labwork: BMET in 10 days  Testing/Procedures: none  Follow-Up: Your physician recommends that you schedule a follow-up appointment in: 1 month with Dr. Fletcher Anon.    Any Other Special Instructions Will Be Listed Below (If Applicable).     If you need a refill on your cardiac medications before your next appointment, please call your pharmacy.

## 2016-06-17 NOTE — Telephone Encounter (Signed)
*  STAT* If patient is at the pharmacy, call can be transferred to refill team.   1. Which medications need to be refilled? (please list name of each medication and dose if known)spironolactone (ALDACTONE) 25 MG tablet  2. Which pharmacy/location (including street and city if local pharmacy) is medication to be sent to? [facility]  3. Do they need a 30 day or 90 day supply?  [facility]

## 2016-06-24 ENCOUNTER — Other Ambulatory Visit: Payer: Self-pay | Admitting: Family Medicine

## 2016-06-24 DIAGNOSIS — Z1231 Encounter for screening mammogram for malignant neoplasm of breast: Secondary | ICD-10-CM

## 2016-06-27 ENCOUNTER — Other Ambulatory Visit: Payer: Self-pay

## 2016-06-30 ENCOUNTER — Other Ambulatory Visit (INDEPENDENT_AMBULATORY_CARE_PROVIDER_SITE_OTHER): Payer: Medicare Other

## 2016-06-30 DIAGNOSIS — I5022 Chronic systolic (congestive) heart failure: Secondary | ICD-10-CM | POA: Diagnosis not present

## 2016-07-01 LAB — BASIC METABOLIC PANEL
BUN / CREAT RATIO: 21 (ref 12–28)
BUN: 27 mg/dL (ref 8–27)
CHLORIDE: 102 mmol/L (ref 96–106)
CO2: 24 mmol/L (ref 18–29)
Calcium: 9.4 mg/dL (ref 8.7–10.3)
Creatinine, Ser: 1.3 mg/dL — ABNORMAL HIGH (ref 0.57–1.00)
GFR calc non Af Amer: 41 mL/min/{1.73_m2} — ABNORMAL LOW (ref >59)
GFR, EST AFRICAN AMERICAN: 47 mL/min/{1.73_m2} — AB (ref >59)
Glucose: 112 mg/dL — ABNORMAL HIGH (ref 65–99)
POTASSIUM: 4.6 mmol/L (ref 3.5–5.2)
Sodium: 144 mmol/L (ref 134–144)

## 2016-07-02 ENCOUNTER — Ambulatory Visit (INDEPENDENT_AMBULATORY_CARE_PROVIDER_SITE_OTHER): Payer: Medicare Other

## 2016-07-02 DIAGNOSIS — Z953 Presence of xenogenic heart valve: Secondary | ICD-10-CM

## 2016-07-02 DIAGNOSIS — I4891 Unspecified atrial fibrillation: Secondary | ICD-10-CM

## 2016-07-02 DIAGNOSIS — Z5181 Encounter for therapeutic drug level monitoring: Secondary | ICD-10-CM

## 2016-07-02 LAB — POCT INR: INR: 2.4

## 2016-07-03 ENCOUNTER — Other Ambulatory Visit: Payer: Self-pay | Admitting: *Deleted

## 2016-07-03 DIAGNOSIS — I10 Essential (primary) hypertension: Secondary | ICD-10-CM

## 2016-07-03 DIAGNOSIS — I5022 Chronic systolic (congestive) heart failure: Secondary | ICD-10-CM

## 2016-07-03 DIAGNOSIS — R0602 Shortness of breath: Secondary | ICD-10-CM

## 2016-07-03 DIAGNOSIS — I4891 Unspecified atrial fibrillation: Secondary | ICD-10-CM

## 2016-07-04 ENCOUNTER — Other Ambulatory Visit
Admission: RE | Admit: 2016-07-04 | Discharge: 2016-07-04 | Disposition: A | Discharge status: Home / Self Care | Payer: Medicare Other | Source: Ambulatory Visit | Attending: Cardiovascular Disease | Admitting: Cardiovascular Disease

## 2016-07-04 DIAGNOSIS — R0602 Shortness of breath: Secondary | ICD-10-CM | POA: Insufficient documentation

## 2016-07-04 DIAGNOSIS — I110 Hypertensive heart disease with heart failure: Secondary | ICD-10-CM | POA: Insufficient documentation

## 2016-07-04 DIAGNOSIS — I4891 Unspecified atrial fibrillation: Secondary | ICD-10-CM | POA: Diagnosis not present

## 2016-07-04 DIAGNOSIS — I10 Essential (primary) hypertension: Secondary | ICD-10-CM

## 2016-07-04 DIAGNOSIS — I5022 Chronic systolic (congestive) heart failure: Secondary | ICD-10-CM | POA: Diagnosis not present

## 2016-07-04 LAB — CBC WITH DIFFERENTIAL/PLATELET
BASOS ABS: 0.1 10*3/uL (ref 0–0.1)
Basophils Relative: 1 %
EOS PCT: 3 %
Eosinophils Absolute: 0.2 10*3/uL (ref 0–0.7)
HCT: 38.3 % (ref 35.0–47.0)
Hemoglobin: 12.8 g/dL (ref 12.0–16.0)
Lymphocytes Relative: 33 %
Lymphs Abs: 2.3 10*3/uL (ref 1.0–3.6)
MCH: 30.2 pg (ref 26.0–34.0)
MCHC: 33.5 g/dL (ref 32.0–36.0)
MCV: 90.2 fL (ref 80.0–100.0)
MONO ABS: 0.7 10*3/uL (ref 0.2–0.9)
Monocytes Relative: 11 %
Neutro Abs: 3.6 10*3/uL (ref 1.4–6.5)
Neutrophils Relative %: 52 %
PLATELETS: 209 10*3/uL (ref 150–440)
RBC: 4.25 MIL/uL (ref 3.80–5.20)
RDW: 14.8 % — AB (ref 11.5–14.5)
WBC: 6.9 10*3/uL (ref 3.6–11.0)

## 2016-07-04 LAB — SEDIMENTATION RATE: Sed Rate: 13 mm/hr (ref 0–30)

## 2016-07-06 ENCOUNTER — Other Ambulatory Visit: Payer: Self-pay | Admitting: Family Medicine

## 2016-07-06 ENCOUNTER — Other Ambulatory Visit: Payer: Self-pay | Admitting: Cardiovascular Disease

## 2016-07-06 DIAGNOSIS — R7303 Prediabetes: Secondary | ICD-10-CM

## 2016-07-07 ENCOUNTER — Ambulatory Visit (INDEPENDENT_AMBULATORY_CARE_PROVIDER_SITE_OTHER): Payer: Medicare Other

## 2016-07-07 VITALS — BP 102/72 | HR 79 | Temp 97.5°F | Ht 62.25 in | Wt 148.0 lb

## 2016-07-07 DIAGNOSIS — R7303 Prediabetes: Secondary | ICD-10-CM | POA: Diagnosis not present

## 2016-07-07 DIAGNOSIS — E2839 Other primary ovarian failure: Secondary | ICD-10-CM | POA: Diagnosis not present

## 2016-07-07 DIAGNOSIS — Z0000 Encounter for general adult medical examination without abnormal findings: Secondary | ICD-10-CM | POA: Diagnosis not present

## 2016-07-07 LAB — HEMOGLOBIN A1C: Hgb A1c MFr Bld: 6.1 % (ref 4.6–6.5)

## 2016-07-07 NOTE — Progress Notes (Signed)
Subjective:   Elizabeth Hall is a 76 y.o. female who presents for Medicare Annual (Subsequent) preventive examination.  Review of Systems:  N/A Cardiac Risk Factors include: advanced age (>22men, >39 women);hypertension;dyslipidemia     Objective:     Vitals: BP 102/72 (BP Location: Right Arm, Patient Position: Sitting, Cuff Size: Normal)   Pulse 79   Temp 97.5 F (36.4 C) (Oral)   Ht 5' 2.25" (1.581 m) Comment: no shoes  Wt 148 lb (67.1 kg)   LMP  (LMP Unknown)   SpO2 99%   BMI 26.85 kg/m   Body mass index is 26.85 kg/m.   Tobacco History  Smoking Status  . Former Smoker  . Types: Cigarettes  Smokeless Tobacco  . Never Used     Counseling given: No   Past Medical History:  Diagnosis Date  . Atrial fibrillation ([place])   . Bleeding of eye   . CAD (coronary artery disease) 03/09/2000   s/p MI 2001  . Chronic systolic heart failure ([place])   . CKD (chronic kidney disease) stage 3, GFR 30-59 ml/min    baseline Cr 1.5-2; prior saw nephrologist thought hypertensive nephropathy/renovascular disease  . CVA (cerebral infarction) 2014   occipital lobe - some vision loss when coumadin held and not bridged  . Dyslipidemia   . Glaucoma   . H. pylori infection    treated  . 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 Hx of rheumatic fever   . Hypertension   . Melanoma ([place]) 2006   L shoulder  . Phlebitis   . Systolic CHF ([place])    EF [id number] s/p MVR and TVA 2013  . 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 ABLATION  2013  . ATHERECTOMY  06/2012   left arm after coumadin held without bridging  . CARDIOVASCULAR STRESS TEST  03/2016   WNL  . CATARACT EXTRACTION Bilateral 2007, 2010  . COLONOSCOPY  03/2014   diverticulosis, int hem, rpt 5 yrs for h/o polyps ([place])  .  defibrillator     MEDTRONIC 5086 MRI 52 CM LEAD, SERIAL # LFP [id number]  . DG ABDOMEN COMPLETE ([place] HX)  02/2013   HH, mod severe erosive gastritis, duodenitis  . GALLBLADDER SURGERY  2006  . INSERT / REPLACE / REMOVE PACEMAKER  08/2013  . MITRAL VALVE REPLACEMENT  05/2012  . TOTAL ABDOMINAL HYSTERECTOMY W/ BILATERAL SALPINGOOPHORECTOMY  1991   irregular bleeding  . TRICUSPID VALVE REPLACEMENT  05/2012  . US ECHOCARDIOGRAPHY  02/2014   EF 40%, LA marked dilation, gen LV hypokinesis, mitral prosthetic valve   Family History  Problem Relation Age of Onset  . CAD Father 57    MI  . Hypertension Brother   . Heart failure Brother     CHF with pacemaker  . Stroke Brother   . Heart failure Sister     CHF  . Stroke Sister   . Diabetes Cousin   . Cancer Maternal Aunt     colon  . Cancer Cousin     breast  . Cancer Cousin     brain tumor   History  Sexual Activity  . Sexual activity: Not Currently  . Birth control/ protection: None    Outpatient Encounter Prescriptions as of 07/07/2016  Medication Sig  . aspirin 81 MG  tablet Take 81 mg by mouth daily.  . bimatoprost (LUMIGAN) 0.03 % ophthalmic solution Place 1 drop into both eyes at bedtime.  . bumetanide (BUMEX) 2 MG tablet Take 1 tablet (2 mg total) by mouth daily.  . carvedilol (COREG) 12.5 MG tablet Take 1 tablet (12.5 mg total) by mouth 2 (two) times daily with a meal.  . estradiol (ESTRACE) 1 MG tablet Take 0.5 mg by mouth daily.  [name] MAGNESIUM PO Take by mouth daily.  . meclizine (ANTIVERT) 25 MG tablet Take 1 tablet (25 mg total) by mouth 2 (two) times daily as needed for dizziness.  . sacubitril-valsartan (ENTRESTO) 24-26 MG Take 1 tablet by mouth 2 (two) times daily.  [name] spironolactone (ALDACTONE) 25 MG tablet TAKE 1/2 TABLET(12.5 MG) BY MOUTH DAILY  . VITAMIN E PO Take by mouth daily.  [name] warfarin (COUMADIN) 3 MG tablet TAKE 1 TABLET BY MOUTH AS DIRECTED  . bumetanide (BUMEX) 2 MG tablet TAKE 1 TABLET(2 MG) BY MOUTH DAILY  (Patient not taking: Reported on 07/07/2016)   No facility-administered encounter medications on file as of 07/07/2016.     Activities of Daily Living In your present state of health, do you have any difficulty performing the following activities: 07/07/2016  Hearing? N  Vision? Y  Difficulty concentrating or making decisions? Y  Walking or climbing stairs? Y  Dressing or bathing? N  Doing errands, shopping? N  Preparing Food and eating ? N  Using the Toilet? N  In the past six months, have you accidently leaked urine? N  Do you have problems with loss of bowel control? N  Managing your Medications? N  Managing your Finances? N  Housekeeping or managing your Housekeeping? N  Some recent data might be hidden    Patient Care Team: Ria Bush, MD as PCP - General (Family Medicine) Ronnell Freshwater, MD as Referring Physician (Ophthalmology) [name], MD as Consulting Physician (Cardiology) [name], MD as Consulting Physician (Obstetrics and Gynecology) [name], MD as Consulting Physician (Cardiology) [name], MD as Consulting Physician (Dermatology)    Assessment:     Hearing Screening   125Hz  250Hz  500Hz  1000Hz  2000Hz  3000Hz  4000Hz  6000Hz  8000Hz   Right ear:   40 40 40  40    Left ear:   40 40 40  40    Vision Screening Comments: Last vision exam with Dr. Joan Mayans in Oct 2017   Exercise Activities and Dietary recommendations Current Exercise Habits: Home exercise routine, Type of exercise: walking, Time (Minutes): 30, Frequency (Times/Week): 3, Weekly Exercise (Minutes/Week): 90, Intensity: Mild, Exercise limited by: None identified  Goals    . Increase physical activity          Starting 07/07/16, I will continue to walk at least 30 min 2-3 days per week.      Fall Risk Fall Risk  07/07/2016 07/02/2015  Falls in the past year? No No   Depression Screen PHQ 2/9 Scores 07/07/2016 07/02/2015  PHQ - 2 Score 0 0     Cognitive  Function MMSE - Mini Mental State Exam 07/07/2016  Orientation to time 5  Orientation to Place 5  Registration 3  Attention/ Calculation 0  Recall 3  Language- name 2 objects 0  Language- repeat 1  Language- follow 3 step command 3  Language- read & follow direction 0  Write a sentence 0  Copy design 0  Total score 20       PLEASE NOTE: A Mini-Cog  screen was completed. Maximum score is 20. A value of 0 denotes this part of [place] MMSE was not completed or the patient failed this part of the Mini-Cog screening.   Mini-Cog Screening Orientation to Time - Max 5 pts Orientation to Place - Max 5 pts Registration - Max 3 pts Recall - Max 3 pts Language Repeat - Max 1 pts Language Follow 3 Step Command - Max 3 pts   Immunization History  Administered Date(s) Administered  . Influenza,inj,Quad PF,36+ Mos 03/21/2015, 03/25/2016  . Pneumococcal Conjugate-13 05/23/2014  . Pneumococcal Polysaccharide-23 07/02/2015  . Td 06/23/2006   Screening Tests Health Maintenance  Topic Date Due  . DEXA SCAN  07/07/2017 (Originally 06/10/2007)  . ZOSTAVAX  07/07/2017 (Originally 06/09/2002)  . DTaP/Tdap/Td (1 - Tdap) 06/22/2026 (Originally 06/24/2006)  . TETANUS/TDAP  06/22/2026 (Originally 06/23/2016)  . MAMMOGRAM  06/11/2017  . COLONOSCOPY  04/22/2019  . INFLUENZA VACCINE  Completed  . PNA vac Low Risk Adult  Completed      Plan:     I have personally reviewed and addressed the Medicare Annual Wellness questionnaire and have noted the following in the patient's chart:  A. Medical and social history B. Use of alcohol, tobacco or illicit drugs  C. Current medications and supplements D. Functional ability and status E.  Nutritional status F.  Physical activity G. Advance directives H. List of other physicians I.  Hospitalizations, surgeries, and ER visits in previous 12 months J.  [place] to include hearing, vision, cognitive, depression L. Referrals and appointments -  none  In addition, I have reviewed and discussed with patient certain preventive protocols, quality metrics, and best practice recommendations. A written personalized care plan for preventive services as well as general preventive health recommendations were provided to patient.  See attached scanned questionnaire for additional information.   Signed,   Lindell Noe, MHA, BS, LPN Health Coach

## 2016-07-07 NOTE — Progress Notes (Signed)
Pre visit review using our clinic review tool, if applicable. No additional management support is needed unless otherwise documented below in the visit note. 

## 2016-07-07 NOTE — Progress Notes (Signed)
I reviewed health advisor's note, was available for consultation, and agree with documentation and plan.  

## 2016-07-07 NOTE — Progress Notes (Signed)
PCP notes:   Health maintenance:  Tetanus - postponed/insurance Shingles - postponed/insurance Bone density - ordered; appt scheduled  Abnormal screenings:   None  Patient concerns:   None  Nurse concerns:  None  Next PCP appt:   07/18/16 @ 1500

## 2016-07-07 NOTE — Patient Instructions (Signed)
Elizabeth Hall , Thank you for taking time to come for your Medicare Wellness Visit. I appreciate your ongoing commitment to your health goals. Please review the following plan we discussed and let me know if I can assist you in the future.   These are the goals we discussed: Goals    . Increase physical activity          Starting 07/07/16, I will continue to walk at least 30 min 2-3 days per week.       This is a list of the screening recommended for you and due dates:  Health Maintenance  Topic Date Due  . DEXA scan (bone density measurement)  07/07/2017*  . Shingles Vaccine  07/07/2017*  . DTaP/Tdap/Td vaccine (1 - Tdap) 06/22/2026*  . Tetanus Vaccine  06/22/2026*  . Mammogram  06/11/2017  . Colon Cancer Screening  04/22/2019  . Flu Shot  Completed  . Pneumonia vaccines  Completed  *Topic was postponed. The date shown is not the original due date.   Preventive Care for Adults  A healthy lifestyle and preventive care can promote health and wellness. Preventive health guidelines for adults include the following key practices.  . A routine yearly physical is a good way to check with your health care provider about your health and preventive screening. It is a chance to share any concerns and updates on your health and to receive a thorough exam.  . Visit your dentist for a routine exam and preventive care every 6 months. Brush your teeth twice a day and floss once a day. Good oral hygiene prevents tooth decay and gum disease.  . The frequency of eye exams is based on your age, health, family medical history, use  of contact lenses, and other factors. Follow your health care provider's ecommendations for frequency of eye exams.  . Eat a healthy diet. Foods like vegetables, fruits, whole grains, low-fat dairy products, and lean protein foods contain the nutrients you need without too many calories. Decrease your intake of foods high in solid fats, added sugars, and salt. Eat the right  amount of calories for you. Get information about a proper diet from your health care provider, if necessary.  . Regular physical exercise is one of the most important things you can do for your health. Most adults should get at least 150 minutes of moderate-intensity exercise (any activity that increases your heart rate and causes you to sweat) each week. In addition, most adults need muscle-strengthening exercises on 2 or more days a week.  Silver Sneakers may be a benefit available to you. To determine eligibility, you may visit the website: www.silversneakers.com or contact program at (423)799-8738 Mon-Fri between 8AM-8PM.   . Maintain a healthy weight. The body mass index (BMI) is a screening tool to identify possible weight problems. It provides an estimate of body fat based on height and weight. Your health care provider can find your BMI and can help you achieve or maintain a healthy weight.   For adults 20 years and older: ? A BMI below 18.5 is considered underweight. ? A BMI of 18.5 to 24.9 is normal. ? A BMI of 25 to 29.9 is considered overweight. ? A BMI of 30 and above is considered obese.   . Maintain normal blood lipids and cholesterol levels by exercising and minimizing your intake of saturated fat. Eat a balanced diet with plenty of fruit and vegetables. Blood tests for lipids and cholesterol should begin at age 79 and be  repeated every 5 years. If your lipid or cholesterol levels are high, you are over 50, or you are at high risk for heart disease, you may need your cholesterol levels checked more frequently. Ongoing high lipid and cholesterol levels should be treated with medicines if diet and exercise are not working.  . If you smoke, find out from your health care provider how to quit. If you do not use tobacco, please do not start.  . If you choose to drink alcohol, please do not consume more than 2 drinks per day. One drink is considered to be 12 ounces (355 mL) of beer, 5  ounces (148 mL) of wine, or 1.5 ounces (44 mL) of liquor.  . If you are 41-61 years old, ask your health care provider if you should take aspirin to prevent strokes.  . Use sunscreen. Apply sunscreen liberally and repeatedly throughout the day. You should seek shade when your shadow is shorter than you. Protect yourself by wearing Scali sleeves, pants, a wide-brimmed hat, and sunglasses year round, whenever you are outdoors.  . Once a month, do a whole body skin exam, using a mirror to look at the skin on your back. Tell your health care provider of new moles, moles that have irregular borders, moles that are larger than a pencil eraser, or moles that have changed in shape or color.

## 2016-07-11 ENCOUNTER — Ambulatory Visit: Payer: Self-pay

## 2016-07-11 ENCOUNTER — Other Ambulatory Visit: Payer: Self-pay | Admitting: Cardiovascular Disease

## 2016-07-18 ENCOUNTER — Ambulatory Visit (INDEPENDENT_AMBULATORY_CARE_PROVIDER_SITE_OTHER): Payer: Medicare Other | Admitting: Family Medicine

## 2016-07-18 ENCOUNTER — Encounter: Payer: Self-pay | Admitting: Family Medicine

## 2016-07-18 ENCOUNTER — Other Ambulatory Visit: Payer: Self-pay | Admitting: Cardiovascular Disease

## 2016-07-18 VITALS — BP 110/64 | HR 84 | Temp 98.0°F | Wt 150.5 lb

## 2016-07-18 DIAGNOSIS — N183 Chronic kidney disease, stage 3 unspecified: Secondary | ICD-10-CM

## 2016-07-18 DIAGNOSIS — Z7189 Other specified counseling: Secondary | ICD-10-CM

## 2016-07-18 DIAGNOSIS — I480 Paroxysmal atrial fibrillation: Secondary | ICD-10-CM

## 2016-07-18 DIAGNOSIS — Z79890 Hormone replacement therapy (postmenopausal): Secondary | ICD-10-CM

## 2016-07-18 DIAGNOSIS — I10 Essential (primary) hypertension: Secondary | ICD-10-CM

## 2016-07-18 DIAGNOSIS — R7303 Prediabetes: Secondary | ICD-10-CM | POA: Diagnosis not present

## 2016-07-18 DIAGNOSIS — I5022 Chronic systolic (congestive) heart failure: Secondary | ICD-10-CM

## 2016-07-18 DIAGNOSIS — Z950 Presence of cardiac pacemaker: Secondary | ICD-10-CM

## 2016-07-18 DIAGNOSIS — E785 Hyperlipidemia, unspecified: Secondary | ICD-10-CM

## 2016-07-18 DIAGNOSIS — Z85820 Personal history of malignant melanoma of skin: Secondary | ICD-10-CM

## 2016-07-18 DIAGNOSIS — Z953 Presence of xenogenic heart valve: Secondary | ICD-10-CM

## 2016-07-18 DIAGNOSIS — H409 Unspecified glaucoma: Secondary | ICD-10-CM

## 2016-07-18 NOTE — Assessment & Plan Note (Addendum)
Scanned 08/2015. Husband [name] then daughter [name] are [place]. Declines prolonged life support if terminal condition. POST form not completed - I was unable to pull up advanced directives today due to epic fuction time out.

## 2016-07-18 NOTE — Patient Instructions (Addendum)
You are doing well today. Nice to see you today! I will be on the lookout for cardiology plan. Return as needed or in 1 year for next medicare wellness visit

## 2016-07-18 NOTE — Progress Notes (Signed)
BP 110/64   Pulse 84   Temp 98 F (36.7 C) (Oral)   Wt 150 lb 8 oz (68.3 kg)   LMP  (LMP Unknown)   BMI 27.31 kg/m    CC: f/u visit Subjective:    Patient ID: Elizabeth Hall, female    DOB: [birth year], 76 y.o.   MRN: [id number]  HPI: Elizabeth Hall is a 76 y.o. female presenting on 07/18/2016 for Annual Exam   Saw [name] last week for medicare wellness visit. Note reviewed.    Ongoing CHF - Dr Fletcher Anon changed loop duretic back to bumex 2mg  daily. S/p MVR with bioprosthetic valve. Improved dyspnea and pedal edema - but symptoms persist. Has f/u next week.   CKD stage 3 - stable over the past year. Previously saw nephrologist - thought hypertensive nephropathy.   Has decreased estradiol to twice weekly. Has f/u appt with Dr Kennon Rounds at end of month.   Preventative: COLONOSCOPY Date: 03/2014 diverticulosis, int hem, rpt 5 yrs for h/o polyps ([place]) Well woman - 07/2014. Gets every 2 years. followd by Abel Presto.  Mammogram - 05/2015 normal. Pending rpt DEXA - pending  Yearly flu shot Tetanus - 2008 Pneumovax 2017, prevnar 05/2014 zostavax - declines.  Advanced directive - Scanned 08/2015. Husband [name] then daughter [name] are [place]. Declines prolonged life support if terminal condition. POST form not completed - I was unable to pull up advanced directives today due to epic fuction time out. Seat belt use discussed.  Sunscreen use discussed. No changing moles on skin. Personal h/o melanoma. Sees derm regularly (Dr Nicole Kindred). 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 Interim medical history since our last visit reviewed. Allergies and medications reviewed and updated. Current Outpatient Prescriptions on File Prior to Visit    Medication Sig  . aspirin 81 MG tablet Take 81 mg by mouth daily.  . bimatoprost (LUMIGAN) 0.03 % ophthalmic solution Place 1 drop into both eyes at bedtime.  . bumetanide (BUMEX) 2 MG tablet TAKE 1 TABLET(2 MG) BY MOUTH DAILY  . estradiol (ESTRACE) 1 MG tablet Take 0.5 mg by mouth 2 (two) times a week.   [name] MAGNESIUM PO Take by mouth daily.  . meclizine (ANTIVERT) 25 MG tablet Take 1 tablet (25 mg total) by mouth 2 (two) times daily as needed for dizziness.  . sacubitril-valsartan (ENTRESTO) 24-26 MG Take 1 tablet by mouth 2 (two) times daily.  [name] spironolactone (ALDACTONE) 25 MG tablet TAKE 1/2 TABLET(12.5 MG) BY MOUTH DAILY  . VITAMIN E PO Take by mouth daily.  [name] warfarin (COUMADIN) 3 MG tablet TAKE 1 TABLET BY MOUTH AS DIRECTED   No current facility-administered medications on file prior to visit.     Review of Systems Per HPI unless specifically indicated in ROS section     Objective:    BP 110/64   Pulse 84   Temp 98 F (36.7 C) (Oral)   Wt 150 lb 8 oz (68.3 kg)   LMP  (LMP Unknown)   BMI 27.31 kg/m   Wt Readings from Last 3 Encounters:  07/18/16 150 lb 8 oz (68.3 kg)  07/07/16 148 lb (67.1 kg)  06/17/16 152 lb 8 oz (69.2 kg)    Physical Exam  Constitutional: She is oriented to person, place, and  time. She appears well-developed and well-nourished. No distress.  HENT:  Head: Normocephalic and atraumatic.  Right Ear: Hearing, tympanic membrane, external ear and ear canal normal.  Left Ear: Hearing, tympanic membrane, external ear and ear canal normal.  Nose: Nose normal.  Mouth/Throat: Uvula is midline, oropharynx is clear and moist and mucous membranes are normal. No oropharyngeal exudate, posterior oropharyngeal edema or posterior oropharyngeal erythema.  Eyes: Conjunctivae and EOM are normal. Pupils are equal, round, and reactive to light. No scleral icterus.  Neck: Normal range of motion. Neck supple. Carotid bruit is not present. No thyromegaly present.   Cardiovascular: Normal rate, regular rhythm, normal heart sounds and intact distal pulses.   No murmur heard. Pulses:      Radial pulses are 2+ on the right side, and 2+ on the left side.  Pulmonary/Chest: Effort normal and breath sounds normal. No respiratory distress. She has no wheezes. She has no rales.  Abdominal: Soft. Bowel sounds are normal. She exhibits no distension and no mass. There is no tenderness. There is no rebound and no guarding.  Musculoskeletal: Normal range of motion. She exhibits no edema.  Lymphadenopathy:    She has no cervical adenopathy.  Neurological: She is alert and oriented to person, place, and time.  CN grossly intact, station and gait intact  Skin: Skin is warm and dry. No rash noted.  Psychiatric: She has a normal mood and affect. Her behavior is normal. Judgment and thought content normal.  Nursing note and vitals reviewed.  Results for orders placed or performed in visit on 07/07/16  Hemoglobin A1c  Result Value Ref Range   Hgb A1c MFr Bld 6.1 4.6 - 6.5 %   Lab Results  Component Value Date   CHOL 160 06/27/2015   HDL 51.60 06/27/2015   LDLCALC 96 06/27/2015   TRIG 62.0 06/27/2015   CHOLHDL 3 06/27/2015       Assessment & Plan:   Problem List Items Addressed This Visit    Advanced care planning/counseling discussion    Scanned 08/2015. Husband [name] then daughter [name] are [place]. Declines prolonged life support if terminal condition. POST form not completed - I was unable to pull up advanced directives today due to epic fuction time out.      Atrial fibrillation (HCC)    Chronic, stable. Sounds regular today. Continue coumadin.       Chronic systolic heart failure ([place])    Appreciate cards care.       CKD (chronic kidney disease) stage 3, GFR 30-59 ml/min - Primary    Chronic, stable. Reviewed with patient. She knows to avoid nephrotoxins and stay well hydrated.      Dyslipidemia    Chronic off statin.       Glaucoma     Appreciate ophtho care.      History of melanoma    Continue f/u with derm.      Hypertension    Chronic, stable. Continue current regimen.       Postmenopausal HRT (hormone replacement therapy)    Stable on minimal estrogen therapy. Has f/u with OBGYN in next few weeks for well woman exam.       Prediabetes    Reviewed with patient. rec avoid added sugars in diet.       Status post biventricular pacemaker   Status post mitral valve replacement with tissue valve       Follow up plan: Return in about 1 year (around 07/18/2017) for medicare wellness visit.  Ria Bush, MD

## 2016-07-18 NOTE — Assessment & Plan Note (Signed)
Chronic off statin.

## 2016-07-18 NOTE — Assessment & Plan Note (Signed)
Chronic, stable. Reviewed with patient. She knows to avoid nephrotoxins and stay well hydrated.

## 2016-07-18 NOTE — Assessment & Plan Note (Signed)
Continue f/u with derm

## 2016-07-18 NOTE — Assessment & Plan Note (Signed)
Appreciate ophtho care.  

## 2016-07-18 NOTE — Assessment & Plan Note (Signed)
Chronic, stable. Sounds regular today. Continue coumadin  

## 2016-07-18 NOTE — Assessment & Plan Note (Signed)
Stable on minimal estrogen therapy. Has f/u with OBGYN in next few weeks for well woman exam.

## 2016-07-18 NOTE — Assessment & Plan Note (Addendum)
Reviewed with patient. rec avoid added sugars in diet.

## 2016-07-18 NOTE — Assessment & Plan Note (Signed)
Appreciate cards care. 

## 2016-07-18 NOTE — Assessment & Plan Note (Signed)
Chronic, stable. Continue current regimen. 

## 2016-07-18 NOTE — Progress Notes (Signed)
Pre visit review using our clinic review tool, if applicable. No additional management support is needed unless otherwise documented below in the visit note. 

## 2016-07-21 ENCOUNTER — Ambulatory Visit: Payer: Medicare Other | Admitting: Cardiovascular Disease

## 2016-07-21 NOTE — Progress Notes (Deleted)
Cardiology Office Note   Date:  07/21/2016   ID:  Elizabeth, Hall 1941/06/08, MRN [id number]  PCP:  Elizabeth Bush, MD  Cardiologist:   Elizabeth Sacramento, MD   No chief complaint on file.     History of Present Illness: Elizabeth Hall is a 76 y.o. female who presents for  a follow-up visit. 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 Congestive heart failure was diagnosed in 2007.  She has known history of atrial fibrillation. She underwent AV nodal ablation and pacemaker placement in November 2013. 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 Her ejection fraction deteriorated and she underwent explantation of the previous pacemaker and implantation of biventricular ICD in March 2015. She had CVA in the past when her warfarin was held for a procedure. Since then, she has been bridged with low molecular weight heparin.  Echocardiogram in 12/2015 showed an ejection fraction of 123456, grade 2 diastolic dysfunction, replaced mitral valve with moderate regurgitation and a mean gradient of 5 mmHg. Left atrium was severely dilated. Pulmonary pressure could not be estimated. ABI was normal in 02/2016.  She was switched from Losartan to [facility]. Bumex was switched to Furosemide. Nuclear stress test showed no evidence of ischemia with an EF of 45-54%.     She continues to complain of significant exertional dyspnea with mild leg edema and 4- 5 pounds of weight gain. She reports better symptoms when she was on Bumex. No chest pain.  Past Medical History:  Diagnosis Date  . Atrial fibrillation ([place])   . Bleeding of eye   . CAD (coronary artery disease) 03/09/2000   s/p MI 2001  . Chronic systolic heart failure ([place])   . CKD (chronic kidney disease) stage 3, GFR 30-59 ml/min    baseline Cr 1.5-2; prior saw  nephrologist thought hypertensive nephropathy/renovascular disease  . CVA (cerebral infarction) 2014   occipital lobe - some vision loss when coumadin held and not bridged  . Dyslipidemia   . Glaucoma   . H. pylori infection    treated  . 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 Hx of rheumatic fever   . Hypertension   . Melanoma ([place]) 2006   L shoulder  . Phlebitis   . Systolic CHF ([place])    EF [id number] s/p MVR and TVA 2013  . 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 ABLATION  2013  . ATHERECTOMY  06/2012   left arm after coumadin held without bridging  . CARDIOVASCULAR STRESS TEST  03/2016   WNL  . CATARACT EXTRACTION Bilateral 2007, 2010  . COLONOSCOPY  03/2014   diverticulosis, int hem, rpt 5 yrs for h/o polyps ([place])  . defibrillator     MEDTRONIC 5086 MRI 52 CM LEAD, SERIAL # [id number]  . DG ABDOMEN COMPLETE ([place] [facility])  02/2013   HH, mod severe erosive gastritis, duodenitis  . GALLBLADDER SURGERY  2006  . INSERT / REPLACE / REMOVE PACEMAKER  08/2013  . MITRAL VALVE REPLACEMENT  05/2012  . TOTAL ABDOMINAL HYSTERECTOMY W/ BILATERAL SALPINGOOPHORECTOMY  1991   irregular bleeding  . TRICUSPID VALVE REPLACEMENT  05/2012  .  US ECHOCARDIOGRAPHY  02/2014   EF 40%, LA marked dilation, gen LV hypokinesis, mitral prosthetic valve     Current Outpatient Prescriptions  Medication Sig Dispense Refill  . aspirin 81 MG tablet Take 81 mg by mouth daily.    . bimatoprost (LUMIGAN) 0.03 % ophthalmic solution Place 1 drop into both eyes at bedtime.    . bumetanide (BUMEX) 2 MG tablet TAKE 1 TABLET(2 MG) BY MOUTH DAILY 90 tablet 3  . carvedilol (COREG) 12.5 MG tablet TAKE 1 TABLET(12.5 MG) BY MOUTH TWICE DAILY WITH A MEAL 180 tablet 3  . estradiol (ESTRACE) 1 MG tablet Take 0.5 mg by mouth  2 (two) times a week.     [name] MAGNESIUM PO Take by mouth daily.    . meclizine (ANTIVERT) 25 MG tablet Take 1 tablet (25 mg total) by mouth 2 (two) times daily as needed for dizziness. 60 tablet 2  . sacubitril-valsartan (ENTRESTO) 24-26 MG Take 1 tablet by mouth 2 (two) times daily. 60 tablet 3  . spironolactone (ALDACTONE) 25 MG tablet TAKE 1/2 TABLET(12.5 MG) BY MOUTH DAILY 30 tablet 3  . VITAMIN E PO Take by mouth daily.    [name] warfarin (COUMADIN) 3 MG tablet TAKE 1 TABLET BY MOUTH AS DIRECTED 100 tablet 1   No current facility-administered medications for this visit.     Allergies:   Amiodarone; Phytonadione [vitamin k1]; Biaxin [clarithromycin]; Ciprofloxacin; Flagyl [metronidazole]; and Lisinopril    Social History:  The patient  reports that she has quit smoking. Her smoking use included Cigarettes. She has never used smokeless tobacco. She reports that she drinks alcohol. She reports that she does not use drugs.   Family History:  The patient's family history includes CAD (age of onset: 58) in her father; Cancer in her cousin, cousin, and maternal aunt; Diabetes in her cousin; Heart failure in her brother and sister; Hypertension in her brother; Stroke in her brother and sister.    ROS:  Please see the history of present illness.   Otherwise, review of systems are positive for none.   All other systems are reviewed and negative.    PHYSICAL EXAM: VS:  LMP  (LMP Unknown)  , BMI There is no height or weight on file to calculate BMI. GEN: Well nourished, well developed, in no acute distress  HEENT: normal  Neck: no JVD, carotid bruits, or masses Cardiac: RRR; no murmurs, rubs, or gallops,no edema  Respiratory:  clear to auscultation bilaterally, normal work of breathing GI: soft, nontender, nondistended, + BS MS: no deformity or atrophy  Skin: warm and dry, no rash Neuro:  Strength and sensation are intact Psych: euthymic mood, full affect   EKG:  EKG  not ordered  today. Ventricular paced rhythm with underlying A-fib?   Recent Labs: 02/27/2016: BNP 164.7; TSH 1.690 06/30/2016: BUN 27; Creatinine, Ser 1.30; Potassium 4.6; Sodium 144 07/04/2016: Hemoglobin 12.8; Platelets 209    Lipid Panel    Component Value Date/Time   CHOL 160 06/27/2015 0841   CHOL 192 06/12/2014   TRIG 62.0 06/27/2015 0841   TRIG 70 06/12/2014   HDL 51.60 06/27/2015 0841   CHOLHDL 3 06/27/2015 0841   VLDL 12.4 06/27/2015 0841   LDLCALC 96 06/27/2015 0841   LDLCALC 104 06/12/2014      Wt Readings from Last 3 Encounters:  07/18/16 150 lb 8 oz (68.3 kg)  07/07/16 148 lb (67.1 kg)  06/17/16 152 lb 8 oz (69.2 kg)  ASSESSMENT AND PLAN:  1.  Chronic systolic heart failure:  Most recent ejection fraction was 35-40%.  She is mildly volume overloaded with worsening symptoms. She is about 4 pounds above her dry weight. I elected to switch her back to Bumex 2 mg once daily. If her symptoms are not improved, we might need to consider a right and left cardiac catheterization.  2. Paroxysmal atrial fibrillation: Continue [place]-term anticoagulation with warfarin.  3. Essential hypertension: Blood pressure is well controlled on current medications.  4. Atypical bilateral leg pain: normal ABI  5. Status post mitral valve replacement with bioprosthetic valve: Function was acceptable on most recent echocardiogram although there was moderate regurgitation.   Disposition:   FU with me in 3 months  Signed,  Elizabeth Sacramento, MD  07/21/2016 1:06 PM    [place]

## 2016-07-22 ENCOUNTER — Encounter: Payer: Self-pay | Admitting: Family Medicine

## 2016-07-22 ENCOUNTER — Ambulatory Visit (INDEPENDENT_AMBULATORY_CARE_PROVIDER_SITE_OTHER): Payer: Medicare Other | Admitting: Family Medicine

## 2016-07-22 VITALS — BP 108/79 | HR 79 | Ht 62.0 in | Wt 153.0 lb

## 2016-07-22 DIAGNOSIS — Z79890 Hormone replacement therapy (postmenopausal): Secondary | ICD-10-CM

## 2016-07-22 DIAGNOSIS — Z0000 Encounter for general adult medical examination without abnormal findings: Secondary | ICD-10-CM | POA: Diagnosis not present

## 2016-07-22 DIAGNOSIS — Z01419 Encounter for gynecological examination (general) (routine) without abnormal findings: Secondary | ICD-10-CM

## 2016-07-22 NOTE — Progress Notes (Signed)
   Subjective:     Elizabeth Hall is a 76 y.o. female and is here for a comprehensive physical exam. The patient reports no problems.Previously worked in a Artist. Here for check up. On HRT 0.5 mg 2x/wk. Develops hot flashes/night sweats if not taking. C/o vaginal dryness. No longer sexually active. Notes no issues. Mammogram scheduled.  Social History   Social History  . Marital status: Married    Spouse name: N/A  . Number of children: N/A  . Years of education: N/A   Occupational History  . Not on file.   Social History Main Topics  . Smoking status: Former Smoker    Types: Cigarettes  . Smokeless tobacco: Never Used  . Alcohol use 0.0 oz/week     Comment: occasional  . Drug use: No  . Sexual activity: Not Currently    Birth control/ protection: None   Other Topics Concern  . 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 DEXA SCAN  07/07/2017 (Originally 06/10/2007)  . ZOSTAVAX  07/07/2017 (Originally 06/09/2002)  . DTaP/Tdap/Td (1 - Tdap) 06/22/2026 (Originally 06/24/2006)  . TETANUS/TDAP  06/22/2026 (Originally 06/23/2016)  . MAMMOGRAM  06/11/2017  . COLONOSCOPY  04/22/2019  . INFLUENZA VACCINE  Completed  . PNA vac Low Risk Adult  Completed    The following portions of the patient's history were reviewed and updated as appropriate: allergies, current medications, past family history, past medical history, past social history, past surgical history and problem list.  Review of Systems Pertinent items noted in HPI and remainder of comprehensive ROS otherwise negative.   Objective:    BP 108/79   Pulse 79   Ht 5\' 2"  (1.575 m)   Wt 153 lb (69.4 kg)   LMP  (LMP Unknown)   BMI 27.98 kg/m  General appearance: alert, cooperative and  appears stated age Head: Normocephalic, without obvious abnormality, atraumatic Neck: no adenopathy, supple, symmetrical, trachea midline and thyroid not enlarged, symmetric, no tenderness/mass/nodules Lungs: clear to auscultation bilaterally Breasts: normal appearance, no masses or tenderness Heart: regular rate and rhythm, S1, S2 normal, no murmur, click, rub or gallop Abdomen: soft, non-tender; bowel sounds normal; no masses,  no organomegaly Pelvic: external genitalia normal, no adnexal masses or tenderness, uterus normal size, shape, and consistency, uterus surgically absent and vaginal atrophy Extremities: Homans sign is negative, no sign of DVT Pulses: 2+ and symmetric Skin: Skin color, texture, turgor normal. No rashes or lesions Lymph nodes: Cervical, supraclavicular, and axillary nodes normal. Neurologic: Grossly normal    Assessment:    GYN female exam.      Plan:    Does not need further GYN exams or pap smears. This was conveyed to the patient. Does not need refills of her estrogen right now.  Return as needed.  See After Visit Summary for Counseling Recommendations

## 2016-07-22 NOTE — Patient Instructions (Signed)
Preventive Care 75 Years and Older, Female Preventive care refers to lifestyle choices and visits with your health care provider that can promote health and wellness. What does preventive care include?  A yearly physical exam. This is also called an annual well check.  Dental exams once or twice a year.  Routine eye exams. Ask your health care provider how often you should have your eyes checked.  Personal lifestyle choices, including:  Daily care of your teeth and gums.  Regular physical activity.  Eating a healthy diet.  Avoiding tobacco and drug use.  Limiting alcohol use.  Practicing safe sex.  Taking low-dose aspirin every day.  Taking vitamin and mineral supplements as recommended by your health care provider. What happens during an annual well check? The services and screenings done by your health care provider during your annual well check will depend on your age, overall health, lifestyle risk factors, and family history of disease. Counseling  Your health care provider may ask you questions about your:  Alcohol use.  Tobacco use.  Drug use.  Emotional well-being.  Home and relationship well-being.  Sexual activity.  Eating habits.  History of falls.  Memory and ability to understand (cognition).  Work and work environment.  Reproductive health. Screening  You may have the following tests or measurements:  Height, weight, and BMI.  Blood pressure.  Lipid and cholesterol levels. These may be checked every 5 years, or more frequently if you are over 50 years old.  Skin check.  Lung cancer screening. You may have this screening every year starting at age 55 if you have a 30-pack-year history of smoking and currently smoke or have quit within the past 15 years.  Fecal occult blood test (FOBT) of the stool. You may have this test every year starting at age 50.  Flexible sigmoidoscopy or colonoscopy. You may have a sigmoidoscopy every 5 years or  a colonoscopy every 10 years starting at age 50.  Hepatitis C blood test.  Hepatitis B blood test.  Sexually transmitted disease (STD) testing.  Diabetes screening. This is done by checking your blood sugar (glucose) after you have not eaten for a while (fasting). You may have this done every 1-3 years.  Bone density scan. This is done to screen for osteoporosis. You may have this done starting at age 75.  Mammogram. This may be done every 1-2 years. Talk to your health care provider about how often you should have regular mammograms. Talk with your health care provider about your test results, treatment options, and if necessary, the need for more tests. Vaccines  Your health care provider may recommend certain vaccines, such as:  Influenza vaccine. This is recommended every year.  Tetanus, diphtheria, and acellular pertussis (Tdap, Td) vaccine. You may need a Td booster every 10 years.  Varicella vaccine. You may need this if you have not been vaccinated.  Zoster vaccine. You may need this after age 60.  Measles, mumps, and rubella (MMR) vaccine. You may need at least one dose of MMR if you were born in 1957 or later. You may also need a second dose.  Pneumococcal 13-valent conjugate (PCV13) vaccine. One dose is recommended after age 75.  Pneumococcal polysaccharide (PPSV23) vaccine. One dose is recommended after age 75.  Meningococcal vaccine. You may need this if you have certain conditions.  Hepatitis A vaccine. You may need this if you have certain conditions or if you travel or work in places where you may be exposed to   hepatitis A.  Hepatitis B vaccine. You may need this if you have certain conditions or if you travel or work in places where you may be exposed to hepatitis B.  Haemophilus influenzae type b (Hib) vaccine. You may need this if you have certain conditions. Talk to your health care provider about which screenings and vaccines you need and how often you need  them. This information is not intended to replace advice given to you by your health care provider. Make sure you discuss any questions you have with your health care provider. Document Released: 07/06/2015 Document Revised: 02/27/2016 Document Reviewed: 04/10/2015 Elsevier Interactive Patient Education  2017 Elsevier Inc.  

## 2016-07-24 HISTORY — PX: OTHER SURGICAL HISTORY: SHX169

## 2016-07-25 ENCOUNTER — Ambulatory Visit: Payer: Self-pay

## 2016-07-28 ENCOUNTER — Ambulatory Visit
Admission: RE | Admit: 2016-07-28 | Discharge: 2016-07-28 | Disposition: A | Discharge status: Home / Self Care | Payer: Medicare Other | Source: Ambulatory Visit | Attending: Family Medicine | Admitting: Family Medicine

## 2016-07-28 ENCOUNTER — Other Ambulatory Visit: Payer: Self-pay

## 2016-07-28 DIAGNOSIS — E2839 Other primary ovarian failure: Secondary | ICD-10-CM

## 2016-07-28 DIAGNOSIS — Z1231 Encounter for screening mammogram for malignant neoplasm of breast: Secondary | ICD-10-CM | POA: Diagnosis not present

## 2016-07-28 DIAGNOSIS — Z13820 Encounter for screening for osteoporosis: Secondary | ICD-10-CM | POA: Diagnosis not present

## 2016-07-28 DIAGNOSIS — Z780 Asymptomatic menopausal state: Secondary | ICD-10-CM | POA: Diagnosis not present

## 2016-07-29 ENCOUNTER — Other Ambulatory Visit: Payer: Self-pay | Admitting: Family Medicine

## 2016-07-29 DIAGNOSIS — R928 Other abnormal and inconclusive findings on diagnostic imaging of breast: Secondary | ICD-10-CM

## 2016-07-31 NOTE — Progress Notes (Signed)
Pt has been scheduled for 08-01-16.

## 2016-08-01 ENCOUNTER — Ambulatory Visit (INDEPENDENT_AMBULATORY_CARE_PROVIDER_SITE_OTHER): Payer: Medicare Other | Admitting: Family Medicine

## 2016-08-01 ENCOUNTER — Ambulatory Visit
Admission: RE | Admit: 2016-08-01 | Discharge: 2016-08-01 | Disposition: A | Discharge status: Home / Self Care | Payer: Medicare Other | Source: Ambulatory Visit | Attending: Family Medicine | Admitting: Family Medicine

## 2016-08-01 ENCOUNTER — Encounter: Payer: Self-pay | Admitting: Family Medicine

## 2016-08-01 DIAGNOSIS — R928 Other abnormal and inconclusive findings on diagnostic imaging of breast: Secondary | ICD-10-CM

## 2016-08-01 DIAGNOSIS — R922 Inconclusive mammogram: Secondary | ICD-10-CM | POA: Diagnosis not present

## 2016-08-01 DIAGNOSIS — J069 Acute upper respiratory infection, unspecified: Secondary | ICD-10-CM

## 2016-08-01 NOTE — Progress Notes (Signed)
Pre visit review using our clinic review tool, if applicable. No additional management support is needed unless otherwise documented below in the visit note. 

## 2016-08-01 NOTE — Patient Instructions (Signed)
Rest and fluids, enough to keep your urine clear.  Try nasal saline and warm compresses.   Take care.  Glad to see you.  Update [place] as needed.

## 2016-08-01 NOTE — Progress Notes (Signed)
Sx started a few days ago.  Runny nose initially, then ST, the cough started.  No fevers.  No vomiting.  Fatigued.  Still on baseline meds, including warfarin.  Some tightness across the face.  No ear pain.  No arm and leg aches.  Sick contact noted, husband has been sick.    H/o CHF at baseline.    Meds, vitals, and allergies reviewed.   ROS: Per HPI unless specifically indicated in ROS section   GEN: nad, alert and oriented HEENT: mucous membranes moist, tm w/o erythema, nasal exam w/o erythema, clear discharge noted,  OP with cobblestoning, Max sinuses mildly ttp B NECK: supple w/o LA CV: rrr.   PULM: ctab, no inc wob EXT: no edema SKIN: no acute rash

## 2016-08-03 DIAGNOSIS — J069 Acute upper respiratory infection, unspecified: Secondary | ICD-10-CM | POA: Insufficient documentation

## 2016-08-03 NOTE — Assessment & Plan Note (Signed)
Nontoxic. Mild symptoms. Overall benign exam. More likely to be viral at this point. Discussed with patient. Clearly okay for outpatient follow-up. Rest and fluids, enough to keep her urine clear.  Try nasal saline and warm compresses.    Update [place] as needed.   She agrees.

## 2016-08-05 ENCOUNTER — Ambulatory Visit (INDEPENDENT_AMBULATORY_CARE_PROVIDER_SITE_OTHER): Payer: Medicare Other | Admitting: *Deleted

## 2016-08-05 DIAGNOSIS — Z95810 Presence of automatic (implantable) cardiac defibrillator: Secondary | ICD-10-CM

## 2016-08-05 DIAGNOSIS — I5021 Acute systolic (congestive) heart failure: Secondary | ICD-10-CM | POA: Diagnosis not present

## 2016-08-05 NOTE — Progress Notes (Signed)
Remote ICD transmission.   

## 2016-08-06 ENCOUNTER — Encounter: Payer: Self-pay | Admitting: Cardiology

## 2016-08-08 ENCOUNTER — Ambulatory Visit: Payer: Medicare Other | Admitting: Cardiovascular Disease

## 2016-08-08 ENCOUNTER — Ambulatory Visit (INDEPENDENT_AMBULATORY_CARE_PROVIDER_SITE_OTHER): Payer: Medicare Other | Admitting: Cardiovascular Disease

## 2016-08-08 ENCOUNTER — Encounter: Payer: Self-pay | Admitting: Cardiovascular Disease

## 2016-08-08 VITALS — BP 120/64 | HR 76 | Ht 63.0 in | Wt 151.2 lb

## 2016-08-08 DIAGNOSIS — I10 Essential (primary) hypertension: Secondary | ICD-10-CM | POA: Diagnosis not present

## 2016-08-08 DIAGNOSIS — I480 Paroxysmal atrial fibrillation: Secondary | ICD-10-CM

## 2016-08-08 DIAGNOSIS — I5022 Chronic systolic (congestive) heart failure: Secondary | ICD-10-CM

## 2016-08-08 LAB — CUP PACEART REMOTE DEVICE CHECK
Battery Remaining Percentage: 80 %
Battery Voltage: 2.99 V
HIGH POWER IMPEDANCE MEASURED VALUE: 54 Ohm
Implantable Lead Implant Date: 20150316
Implantable Lead Implant Date: 20150316
Implantable Lead Location: 753858
Implantable Lead Location: 753860
Implantable Lead Model: 346
Implantable Lead Serial Number: 25130583
Lead Channel Impedance Value: 492 Ohm
Lead Channel Impedance Value: 550 Ohm
Lead Channel Setting Pacing Amplitude: 2 V
Lead Channel Setting Pacing Pulse Width: 0.4 ms
Lead Channel Setting Sensing Sensitivity: 0.8 mV
MDC IDC MSMT LEADCHNL RV PACING THRESHOLD AMPLITUDE: 0.8 V
MDC IDC MSMT LEADCHNL RV PACING THRESHOLD PULSEWIDTH: 0.4 ms
MDC IDC PG IMPLANT DT: 20150316
MDC IDC SESS DTM: 20180215053054
MDC IDC SET LEADCHNL LV PACING AMPLITUDE: 2.5 V
MDC IDC SET LEADCHNL LV PACING PULSEWIDTH: 0.4 ms
MDC IDC SET LEADCHNL LV SENSING SENSITIVITY: 1.6 mV
MDC IDC STAT BRADY RV PERCENT PACED: 100 %
Pulse Gen Serial Number: 60765179

## 2016-08-08 NOTE — Patient Instructions (Signed)
Medication Instructions: Continue same medications.   Labwork: None.   Procedures/Testing: None.   Follow-Up: 4 months with Dr. Evon Dejarnett.   Any Additional Special Instructions Will Be Listed Below (If Applicable).     If you need a refill on your cardiac medications before your next appointment, please call your pharmacy.   

## 2016-08-08 NOTE — Progress Notes (Signed)
Cardiology Office Note   Date:  08/08/2016   ID:  Elizabeth Hall, DOB [birth year], MRN [id number]  PCP:  Ria Bush, MD  Cardiologist:   Kathlyn Sacramento, MD   Chief Complaint  Patient presents with  . other    1 month follow up. Meds reviewed by the pt. verbally. Pt. c/o LE edema and shortness of breath with over exertion.       History of Present Illness: Elizabeth Hall is a 76 y.o. female who presents for  a follow-up visit. 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 Congestive heart failure was diagnosed in 2007.  She has known history of atrial fibrillation. She underwent AV nodal ablation and pacemaker placement in November 2013. 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 Her ejection fraction deteriorated and she underwent explantation of the previous pacemaker and implantation of biventricular ICD in March 2015. She had CVA in the past when her warfarin was held for a procedure. Since then, she has been bridged with low molecular weight heparin.  Echocardiogram in 12/2015 showed an ejection fraction of 123456, grade 2 diastolic dysfunction, replaced mitral valve with moderate regurgitation and a mean gradient of 5 mmHg. Left atrium was severely dilated. Pulmonary pressure could not be estimated. ABI was normal in 02/2016.  She was switched from Losartan to [facility].  Nuclear stress test showed no evidence of ischemia with an EF of 45-54%.    She continued to complain of fatigue and shortness of breath. I did CBC which was normal with no evidence of anemia or leukocytosis. Sedimentation rate was normal. She was switched back from furosemide to Bumex and her weight has been stable. She is finally starting to feel better with improved stamina.  Past Medical History:  Diagnosis Date  . Atrial  fibrillation ([place])   . Bleeding of eye   . CAD (coronary artery disease) 03/09/2000   s/p MI 2001  . Chronic systolic heart failure ([place])   . CKD (chronic kidney disease) stage 3, GFR 30-59 ml/min    baseline Cr 1.5-2; prior saw nephrologist thought hypertensive nephropathy/renovascular disease  . CVA (cerebral infarction) 2014   occipital lobe - some vision loss when coumadin held and not bridged  . Dyslipidemia   . Glaucoma   . H. pylori infection    treated  . 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 Hx of rheumatic fever   . Hypertension   . Melanoma ([place]) 2006   L shoulder  . Phlebitis   . Systolic CHF ([place])    EF [id number] s/p MVR and TVA 2013  . 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 ABDOMINAL HYSTERECTOMY    . ABLATION  2013  . ATHERECTOMY  06/2012   left arm after coumadin held without bridging  . BREAST BIOPSY    . CARDIOVASCULAR STRESS TEST  03/2016   WNL  . CATARACT EXTRACTION Bilateral 2007, 2010  . COLONOSCOPY  03/2014   diverticulosis, int hem, rpt 5 yrs for h/o polyps ([place])  . defibrillator     MEDTRONIC 5086 MRI 52 CM LEAD, SERIAL # [id number]  . DEXA  07/2016  WNL  . DG ABDOMEN COMPLETE ([facility] HX)  02/2013   HH, mod severe erosive gastritis, duodenitis  . GALLBLADDER SURGERY  2006  . INSERT / REPLACE / REMOVE PACEMAKER  08/2013  . MITRAL VALVE REPLACEMENT  05/2012  . TOTAL ABDOMINAL HYSTERECTOMY W/ BILATERAL SALPINGOOPHORECTOMY  1991   irregular bleeding  . TRICUSPID VALVE REPLACEMENT  05/2012  . US ECHOCARDIOGRAPHY  02/2014   EF 40%, LA marked dilation, gen LV hypokinesis, mitral prosthetic valve     Current Outpatient Prescriptions  Medication Sig Dispense Refill  . aspirin 81 MG tablet Take 81 mg by mouth daily.    . bimatoprost (LUMIGAN) 0.03 %  ophthalmic solution Place 1 drop into both eyes at bedtime.    . bumetanide (BUMEX) 2 MG tablet TAKE 1 TABLET(2 MG) BY MOUTH DAILY 90 tablet 3  . carvedilol (COREG) 12.5 MG tablet TAKE 1 TABLET(12.5 MG) BY MOUTH TWICE DAILY WITH A MEAL 180 tablet 3  . estradiol (ESTRACE) 1 MG tablet Take 0.5 mg by mouth 2 (two) times a week.     [name] MAGNESIUM PO Take by mouth daily.    . meclizine (ANTIVERT) 25 MG tablet Take 1 tablet (25 mg total) by mouth 2 (two) times daily as needed for dizziness. 60 tablet 2  . sacubitril-valsartan (ENTRESTO) 24-26 MG Take 1 tablet by mouth 2 (two) times daily. 60 tablet 3  . spironolactone (ALDACTONE) 25 MG tablet TAKE 1/2 TABLET(12.5 MG) BY MOUTH DAILY 30 tablet 3  . VITAMIN E PO Take by mouth daily.    [name] warfarin (COUMADIN) 3 MG tablet TAKE 1 TABLET BY MOUTH AS DIRECTED 100 tablet 1   No current facility-administered medications for this visit.     Allergies:   Amiodarone; Phytonadione [vitamin k1]; Biaxin [clarithromycin]; Ciprofloxacin; Flagyl [metronidazole]; and Lisinopril    Social History:  The patient  reports that she has quit smoking. Her smoking use included Cigarettes. She has never used smokeless tobacco. She reports that she drinks alcohol. She reports that she does not use drugs.   Family History:  The patient's family history includes CAD (age of onset: 12) in her father; Cancer in her cousin, cousin, and maternal aunt; Diabetes in her cousin; Heart failure in her brother and sister; Hypertension in her brother; Stroke in her brother and sister.    ROS:  Please see the history of present illness.   Otherwise, review of systems are positive for none.   All other systems are reviewed and negative.    PHYSICAL EXAM: VS:  BP 120/64 (BP Location: Left Arm, Patient Position: Sitting, Cuff Size: Normal)   Pulse 76   Ht 5\' 3"  (1.6 m)   Wt 151 lb 4 oz (68.6 kg)   LMP  (LMP Unknown) Comment: menopause age 81  BMI 26.79 kg/m  , BMI Body mass index is 26.79  kg/m. GEN: Well nourished, well developed, in no acute distress  HEENT: normal  Neck: no JVD, carotid bruits, or masses Cardiac: RRR; no murmurs, rubs, or gallops,no edema  Respiratory:  clear to auscultation bilaterally, normal work of breathing GI: soft, nontender, nondistended, + BS MS: no deformity or atrophy  Skin: warm and dry, no rash Neuro:  Strength and sensation are intact Psych: euthymic mood, full affect   EKG:  EKG  not ordered today.    Recent Labs: 02/27/2016: BNP 164.7; TSH 1.690 06/30/2016: BUN 27; Creatinine, Ser 1.30; Potassium 4.6; Sodium 144 07/04/2016: Hemoglobin 12.8; Platelets 209    Lipid Panel  Component Value Date/Time   CHOL 160 06/27/2015 0841   CHOL 192 06/12/2014   TRIG 62.0 06/27/2015 0841   TRIG 70 06/12/2014   HDL 51.60 06/27/2015 0841   CHOLHDL 3 06/27/2015 0841   VLDL 12.4 06/27/2015 0841   LDLCALC 96 06/27/2015 0841   LDLCALC 104 06/12/2014      Wt Readings from Last 3 Encounters:  08/08/16 151 lb 4 oz (68.6 kg)  08/01/16 151 lb (68.5 kg)  07/22/16 153 lb (69.4 kg)         ASSESSMENT AND PLAN:  1.  Chronic systolic heart failure:  Most recent ejection fraction was 35-40%.   Symptoms of shortness of breath and fatigue have improved. She is on optimal medical therapy. She continues to be in [place] class III and I advised her to increase her physical activities.  2. Paroxysmal atrial fibrillation: Continue [place]-term anticoagulation with warfarin. Follow-up with Dr. Caryl Comes as planned to see if she is having any frequent A. fib on device interrogation.  3. Essential hypertension: Blood pressure is well controlled on current medications.  4. Atypical bilateral leg pain: normal ABI  5. Status post mitral valve replacement with bioprosthetic valve: Function was acceptable on most recent echocardiogram although there was moderate regurgitation.   Disposition:   FU with me in 4 months  Signed,  Kathlyn Sacramento, MD  08/08/2016 3:40 PM    [facility]

## 2016-08-12 ENCOUNTER — Telehealth: Payer: Self-pay | Admitting: Cardiovascular Disease

## 2016-08-12 ENCOUNTER — Other Ambulatory Visit: Payer: Self-pay | Admitting: Cardiovascular Disease

## 2016-08-12 NOTE — Telephone Encounter (Signed)
Please review for refill. Thanks!  

## 2016-08-12 NOTE — Telephone Encounter (Signed)
Pt states her daughter has been experiencing palpitations, primarily in the evenings.  Daughter had EKG yesterday at [facility] PCP but states it was unremarkable.  PCP stated they would make her an appt w/Dr. Fletcher Anon. Pt is anxious as there is a strong family hx of CAD. Advised pt to make sure daughter or her PCP schedules appt w/Dr. Fletcher Anon. She verbalized understanding and is appreciative of the return call.

## 2016-08-12 NOTE — Telephone Encounter (Signed)
Pt would like to discuss with a nurse regarding a "personal problem:" She sates it is regarding her daughter, who is not a pt here yet. She would not give any more information.

## 2016-08-13 ENCOUNTER — Ambulatory Visit (INDEPENDENT_AMBULATORY_CARE_PROVIDER_SITE_OTHER): Payer: Medicare Other

## 2016-08-13 DIAGNOSIS — Z5181 Encounter for therapeutic drug level monitoring: Secondary | ICD-10-CM | POA: Diagnosis not present

## 2016-08-13 DIAGNOSIS — I4891 Unspecified atrial fibrillation: Secondary | ICD-10-CM

## 2016-08-13 DIAGNOSIS — Z953 Presence of xenogenic heart valve: Secondary | ICD-10-CM

## 2016-08-13 LAB — POCT INR: INR: 2.4

## 2016-08-18 DIAGNOSIS — D229 Melanocytic nevi, unspecified: Secondary | ICD-10-CM | POA: Diagnosis not present

## 2016-08-18 DIAGNOSIS — L578 Other skin changes due to chronic exposure to nonionizing radiation: Secondary | ICD-10-CM | POA: Diagnosis not present

## 2016-08-18 DIAGNOSIS — I8392 Asymptomatic varicose veins of left lower extremity: Secondary | ICD-10-CM | POA: Diagnosis not present

## 2016-08-18 DIAGNOSIS — Z1283 Encounter for screening for malignant neoplasm of skin: Secondary | ICD-10-CM | POA: Diagnosis not present

## 2016-08-18 DIAGNOSIS — R21 Rash and other nonspecific skin eruption: Secondary | ICD-10-CM | POA: Diagnosis not present

## 2016-08-18 DIAGNOSIS — Z85820 Personal history of malignant melanoma of skin: Secondary | ICD-10-CM | POA: Diagnosis not present

## 2016-08-18 DIAGNOSIS — D692 Other nonthrombocytopenic purpura: Secondary | ICD-10-CM | POA: Diagnosis not present

## 2016-08-18 DIAGNOSIS — Z85828 Personal history of other malignant neoplasm of skin: Secondary | ICD-10-CM | POA: Diagnosis not present

## 2016-08-18 DIAGNOSIS — L821 Other seborrheic keratosis: Secondary | ICD-10-CM | POA: Diagnosis not present

## 2016-08-18 DIAGNOSIS — D1800 Hemangioma unspecified site: Secondary | ICD-10-CM | POA: Diagnosis not present

## 2016-08-18 DIAGNOSIS — L905 Scar conditions and fibrosis of skin: Secondary | ICD-10-CM | POA: Diagnosis not present

## 2016-08-19 DIAGNOSIS — H401233 Low-tension glaucoma, bilateral, severe stage: Secondary | ICD-10-CM | POA: Diagnosis not present

## 2016-09-11 ENCOUNTER — Other Ambulatory Visit: Payer: Self-pay

## 2016-09-11 MED ORDER — SACUBITRIL-VALSARTAN 24-26 MG PO TABS: 1.0000 | ORAL_TABLET | Freq: Two times a day (BID) | ORAL | 3 refills | Status: DC

## 2016-09-24 ENCOUNTER — Ambulatory Visit (INDEPENDENT_AMBULATORY_CARE_PROVIDER_SITE_OTHER): Payer: Medicare Other

## 2016-09-24 DIAGNOSIS — Z5181 Encounter for therapeutic drug level monitoring: Secondary | ICD-10-CM | POA: Diagnosis not present

## 2016-09-24 DIAGNOSIS — Z953 Presence of xenogenic heart valve: Secondary | ICD-10-CM

## 2016-09-24 DIAGNOSIS — I4891 Unspecified atrial fibrillation: Secondary | ICD-10-CM

## 2016-09-24 LAB — POCT INR: INR: 2.4

## 2016-10-03 DIAGNOSIS — H1132 Conjunctival hemorrhage, left eye: Secondary | ICD-10-CM | POA: Diagnosis not present

## 2016-10-07 ENCOUNTER — Other Ambulatory Visit: Payer: Self-pay

## 2016-10-07 ENCOUNTER — Telehealth: Payer: Self-pay | Admitting: Cardiovascular Disease

## 2016-10-07 NOTE — Telephone Encounter (Signed)
Printed [employer] information and left at front desk for pick up. Pt aware and will pick it up today.

## 2016-10-07 NOTE — Telephone Encounter (Signed)
Patient wants to do cardiac rehab or silver sneekers please call to discuss orders.

## 2016-10-10 ENCOUNTER — Other Ambulatory Visit: Payer: Self-pay | Admitting: Cardiovascular Disease

## 2016-10-10 MED ORDER — MECLIZINE HCL 25 MG PO TABS: 25.0000 mg | ORAL_TABLET | Freq: Two times a day (BID) | ORAL | 0 refills | Status: DC | PRN

## 2016-11-03 ENCOUNTER — Telehealth: Payer: Self-pay | Admitting: Cardiovascular Disease

## 2016-11-03 MED ORDER — WARFARIN SODIUM 3 MG PO TABS: ORAL_TABLET | ORAL | 1 refills | Status: DC

## 2016-11-03 NOTE — Telephone Encounter (Signed)
°*  STAT* If patient is at the pharmacy, call can be transferred to refill team.   1. Which medications need to be refilled? (please list name of each medication and dose if known)  Warfain  3 mg   2. Which pharmacy/location (including street and city if local pharmacy) is medication to be sent to? [facility] [address]   3. Do they need a 30 day or 90 day supply? 90 day

## 2016-11-11 ENCOUNTER — Ambulatory Visit (INDEPENDENT_AMBULATORY_CARE_PROVIDER_SITE_OTHER): Payer: Medicare Other | Admitting: *Deleted

## 2016-11-11 ENCOUNTER — Encounter: Payer: Self-pay | Admitting: Internal Medicine

## 2016-11-11 ENCOUNTER — Ambulatory Visit
Admission: RE | Admit: 2016-11-11 | Discharge: 2016-11-11 | Disposition: A | Discharge status: Home / Self Care | Payer: Medicare Other | Source: Ambulatory Visit | Attending: Internal Medicine | Admitting: Internal Medicine

## 2016-11-11 ENCOUNTER — Ambulatory Visit (INDEPENDENT_AMBULATORY_CARE_PROVIDER_SITE_OTHER): Payer: Medicare Other | Admitting: Internal Medicine

## 2016-11-11 VITALS — BP 120/68 | HR 75 | Ht 63.0 in | Wt 152.2 lb

## 2016-11-11 DIAGNOSIS — I700 Atherosclerosis of aorta: Secondary | ICD-10-CM | POA: Insufficient documentation

## 2016-11-11 DIAGNOSIS — Z953 Presence of xenogenic heart valve: Secondary | ICD-10-CM

## 2016-11-11 DIAGNOSIS — I482 Chronic atrial fibrillation: Secondary | ICD-10-CM

## 2016-11-11 DIAGNOSIS — I4821 Permanent atrial fibrillation: Secondary | ICD-10-CM

## 2016-11-11 DIAGNOSIS — R0602 Shortness of breath: Secondary | ICD-10-CM | POA: Insufficient documentation

## 2016-11-11 DIAGNOSIS — I4891 Unspecified atrial fibrillation: Secondary | ICD-10-CM | POA: Diagnosis not present

## 2016-11-11 DIAGNOSIS — I428 Other cardiomyopathies: Secondary | ICD-10-CM

## 2016-11-11 DIAGNOSIS — Z95810 Presence of automatic (implantable) cardiac defibrillator: Secondary | ICD-10-CM | POA: Diagnosis not present

## 2016-11-11 DIAGNOSIS — I5022 Chronic systolic (congestive) heart failure: Secondary | ICD-10-CM

## 2016-11-11 DIAGNOSIS — Z5181 Encounter for therapeutic drug level monitoring: Secondary | ICD-10-CM

## 2016-11-11 LAB — CUP PACEART INCLINIC DEVICE CHECK
Battery Voltage: 2.98 V
Date Time Interrogation Session: 20180522131100
HighPow Impedance: 57 Ohm
Implantable Lead Implant Date: 20150316
Implantable Lead Implant Date: 20150316
Implantable Lead Location: 753860
Implantable Lead Model: 346
Implantable Lead Serial Number: 25130583
Implantable Pulse Generator Implant Date: 20150316
Lead Channel Impedance Value: 506 Ohm
Lead Channel Pacing Threshold Pulse Width: 0.4 ms
Lead Channel Setting Pacing Amplitude: 2.5 V
Lead Channel Setting Pacing Pulse Width: 0.4 ms
Lead Channel Setting Sensing Sensitivity: 1.6 mV
MDC IDC LEAD LOCATION: 753858
MDC IDC MSMT LEADCHNL LV IMPEDANCE VALUE: 578 Ohm
MDC IDC MSMT LEADCHNL LV PACING THRESHOLD AMPLITUDE: 1.4 V
MDC IDC MSMT LEADCHNL RV PACING THRESHOLD AMPLITUDE: 0.7 V
MDC IDC MSMT LEADCHNL RV PACING THRESHOLD PULSEWIDTH: 0.4 ms
MDC IDC PG SERIAL: 60765179
MDC IDC SET LEADCHNL LV PACING PULSEWIDTH: 0.4 ms
MDC IDC SET LEADCHNL RV PACING AMPLITUDE: 2.5 V
MDC IDC SET LEADCHNL RV SENSING SENSITIVITY: 0.8 mV
MDC IDC STAT BRADY RV PERCENT PACED: 98 %
Pulse Gen Model: 383547

## 2016-11-11 LAB — POCT INR: INR: 3.4

## 2016-11-11 NOTE — Progress Notes (Signed)
Electrophysiology Office Note   Date:  11/11/2016   ID:  Elizabeth, Hall 24-Sep-1940, MRN [id number]  PCP:  Elizabeth Bush, MD  Cardiologist:  MA Primary Electrophysiologist:   Elizabeth Axe, MD    Chief Complaint  Patient presents with  . other    12 month follow up as well as Defib check. Meds reviewed by the pt. verbally. Pt. c/o shortness of breath with little exertion and has some LE edema.      History of Present Illness: Elizabeth Hall is a 76 y.o. female is seen for pacemaker followup having moved from [place] to be with family  She has hx of AFib and underwent AV nodal ablation and pacemaker placement in November 2013. 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 Her ejection fraction deteriorated and she underwent explantation of the previous pacemaker and implantation of biventricular ICD in March 2015.  She has hx of CVA with holding of anticoagulation; currently INR taret 2.5--3 with asa 81;  Hx of visual bleeding     Her major complaint is shortness of breath. It is provoked by exertion and relieved by rest.  It is unassociated with chest pain. She does not have nocturnal dyspnea or orthopnea. She has had some peripheral edema.  She has no palpitations.  She has complaints of soreness on the medial aspect of her defibrillator  DATE TEST    8/15    Echo   EF 40 %   7/17    Echo   EF 35-40 %   10/17  myoview EF 54-50% NO ISCHEMIA   Date K Cr Hgb  1/18 4.6 1.3 12.8             The patient is tolerating medications without difficulties and is otherwise without complaint today.    Past Medical History:  Diagnosis Date  . Atrial fibrillation ([place])   . Bleeding of eye   . CAD (coronary artery disease) 03/09/2000   s/p MI 2001  . Chronic systolic heart failure ([place])   . CKD (chronic kidney disease) stage 3, GFR 30-59  ml/min    baseline Cr 1.5-2; prior saw nephrologist thought hypertensive nephropathy/renovascular disease  . CVA (cerebral infarction) 2014   occipital lobe - some vision loss when coumadin held and not bridged  . Dyslipidemia   . Glaucoma   . H. pylori infection    treated  . 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 Hx of rheumatic fever   . Hypertension   . Melanoma ([place]) 2006   L shoulder  . Phlebitis   . Systolic CHF ([place])    EF 81% s/p MVR and TVA 2013  . 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 ABDOMINAL HYSTERECTOMY    . ABLATION  2013  . ATHERECTOMY  06/2012   left arm after coumadin held without bridging  . BREAST BIOPSY    . CARDIOVASCULAR STRESS TEST  03/2016   WNL  . CATARACT EXTRACTION Bilateral 2007, 2010  . COLONOSCOPY  03/2014   diverticulosis, int hem, rpt 5 yrs for h/o polyps ([place])  . defibrillator     MEDTRONIC 5086 MRI 52 CM LEAD, SERIAL # LFP [id number]  .  DEXA  07/2016   WNL  . DG ABDOMEN COMPLETE ([facility] HX)  02/2013   HH, mod severe erosive gastritis, duodenitis  . GALLBLADDER SURGERY  2006  . INSERT / REPLACE / REMOVE PACEMAKER  08/2013  . MITRAL VALVE REPLACEMENT  05/2012  . TOTAL ABDOMINAL HYSTERECTOMY W/ BILATERAL SALPINGOOPHORECTOMY  1991   irregular bleeding  . TRICUSPID VALVE REPLACEMENT  05/2012  . US ECHOCARDIOGRAPHY  02/2014   EF 40%, LA marked dilation, gen LV hypokinesis, mitral prosthetic valve     Current Outpatient Prescriptions  Medication Sig Dispense Refill  . aspirin 81 MG tablet Take 81 mg by mouth daily.    . bimatoprost (LUMIGAN) 0.03 % ophthalmic solution Place 1 drop into both eyes at bedtime.    . bumetanide (BUMEX) 2 MG tablet TAKE 1 TABLET(2 MG) BY MOUTH DAILY 90 tablet 3  . carvedilol (COREG) 12.5 MG tablet TAKE 1  TABLET(12.5 MG) BY MOUTH TWICE DAILY WITH A MEAL 180 tablet 3  . estradiol (ESTRACE) 1 MG tablet Take 0.5 mg by mouth 2 (two) times a week.     [name] MAGNESIUM PO Take by mouth daily.    . meclizine (ANTIVERT) 25 MG tablet Take 1 tablet (25 mg total) by mouth 2 (two) times daily as needed for dizziness. 180 tablet 0  . sacubitril-valsartan (ENTRESTO) 24-26 MG Take 1 tablet by mouth 2 (two) times daily. 180 tablet 3  . spironolactone (ALDACTONE) 25 MG tablet TAKE 1/2 TABLET(12.5 MG) BY MOUTH DAILY 30 tablet 3  . VITAMIN E PO Take by mouth daily.    [name] warfarin (COUMADIN) 3 MG tablet Take as directed by [facility] 90 tablet 1   No current facility-administered medications for this visit.     Allergies:   Amiodarone; Phytonadione [vitamin k1]; Biaxin [clarithromycin]; Ciprofloxacin; Flagyl [metronidazole]; and Lisinopril   Social History:  The patient  reports that she has quit smoking. Her smoking use included Cigarettes. She has never used smokeless tobacco. She reports that she drinks alcohol. She reports that she does not use drugs.   Family History:  The patient's    family history includes CAD (age of onset: 15) in her father; Cancer in her cousin, cousin, and maternal aunt; Diabetes in her cousin; Heart failure in her brother and sister; Hypertension in her brother; Stroke in her brother and sister.    ROS:  Please see the history of present illness and past medical history  Otherwise, all other systems were reviewed and were negative.     PHYSICAL EXAM: VS:  BP 120/68 (BP Location: Left Arm, Patient Position: Sitting, Cuff Size: Normal)   Pulse 75   Ht 5\' 3"  (1.6 m)   Wt 152 lb 4 oz (69.1 kg)   LMP  (LMP Unknown) Comment: menopause age 66  BMI 26.97 kg/m  , BMI Body mass index is 26.97 kg/m. Well developed and nourished in no acute distress HENT normal Neck supple with JVP-6-8 Carotids brisk and full without bruits Clear Device pocket well healed; without hematoma or erythema.   There is no tethering there is minimal tenderness in the medial aspect Regular rate and rhythm, 2/6 murmurs or gallops Abd-soft with active BS without hepatomegaly No Clubbing cyanosis tr edema Skin-warm and dry A & Oriented  Grossly normal sensory and motor function   EKG:    ordered today  afib with Vpacing   QRS morphology negative lead V1 upright in lead 1 Chest x-ray personally reviewed 4/16 LV lead posterior defibrillator lead  is in the right ventricular outflow tract    Device interrogation is reviewed today in detail.  See PaceArt for details.   Recent Labs: 02/27/2016: BNP 164.7; TSH 1.690 06/30/2016: BUN 27; Creatinine, Ser 1.30; Potassium 4.6; Sodium 144 07/04/2016: Hemoglobin 12.8; Platelets 209    Lipid Panel     Component Value Date/Time   CHOL 160 06/27/2015 0841   CHOL 192 06/12/2014   TRIG 62.0 06/27/2015 0841   TRIG 70 06/12/2014   HDL 51.60 06/27/2015 0841   CHOLHDL 3 06/27/2015 0841   VLDL 12.4 06/27/2015 0841   LDLCALC 96 06/27/2015 0841   LDLCALC 104 06/12/2014     Wt Readings from Last 3 Encounters:  11/11/16 152 lb 4 oz (69.1 kg)  08/08/16 151 lb 4 oz (68.6 kg)  08/01/16 151 lb (68.5 kg)      Other studies Reviewed: Additional studies/ records that were reviewed today include: echo reports  Demonstrating : As above    ASSESSMENT AND PLAN: NICM  Complete heart block  S/p AV Ablation  ICD - CRT Biotronik  Atrial Fib  Permanent  CVA  Congestive heart failure-Acute/chronic-systolic-class II  High risk medication surveillance  ECG-unusual for CRT  Dyspnea on exertion  Renal insufficiency grade 3  Device pocket pain  she is on guideline directed medical therapy for her nonischemic cardiomyopathy.  Her dyspnea is of some concern. She is largely euvolemic with just a little bit of jugular venous distention. Her heart rate excursion seems to be a little bit brisk with maybe 5-8% of her beats faster than 100 bpm. This may be the culprit  and would benefit from reprogramming   Other possibilities could be exercise associated mitral regurgitation or interval worsening of LV function.  It could also be a pulmonary issue.    we will undertake a stress echo and a chest x-ray initially. In the event that these are not informative, we will consider referral to pulmonary. She is to see Dr. Gaylyn Cheers next month. I will await his input.  Her ECG is quite unusual for CRT as previously noted. Perhaps reprogramming of LV RV offsets might result in an ECG pattern more likely to be associated with resynchronization benefit.    On Anticoagulation;  No bleeding issues   Labs stable on aldactone and entresto     Renal function is stable  Current medicines are reviewed at length with the patient today.   The patient does not have concerns regarding her medicines.  She did have questions about traveling with her ICD The following changes were made today:  none  Labs/ tests ordered today include: met profile  and chest x-ray  Orders Placed This Encounter  Procedures  . DG Chest 2 View  . EKG 12-Lead  . ECHOCARDIOGRAM STRESS TEST     Disposition:   FU with me  1 year(s)  Signed, Elizabeth Axe, MD  11/11/2016 10:45 AM     [facility] [address] [place] [address] [phone] (office) [phone] (fax)

## 2016-11-11 NOTE — Progress Notes (Signed)
Spoke with Eino Farber, PharmD in Coumadin Clinic at [facility]. She dosed coumadin and completed AVS and instructions.

## 2016-11-11 NOTE — Patient Instructions (Addendum)
Medication Instructions: - Your physician recommends that you continue on your current medications as directed. Please refer to the Current Medication list given to you today.  Labwork: - none ordered  Procedures/Testing: - Your physician has requested that you have a stress echocardiogram. For further information please visit [email or web address].  - A chest x-ray takes a picture of the organs and structures inside the chest, including the heart, lungs, and blood vessels. This test can show several things, including, whether the heart is enlarges; whether fluid is building up in the lungs; and whether pacemaker / defibrillator leads are still in place.  Follow-Up: - Remote monitoring is used to monitor your Pacemaker of ICD from home. This monitoring reduces the number of office visits required to check your device to one time per year. It allows [place] to keep an eye on the functioning of your device to ensure it is working properly. You are scheduled for a device check from home on 02/10/17. You may send your transmission at any time that day. If you have a wireless device, the transmission will be sent automatically. After your physician reviews your transmission, you will receive a postcard with your next transmission date.  - Your physician wants you to follow-up in: 1 year with Dr. Caryl Comes. You will receive a reminder letter in the mail two months in advance. If you don't receive a letter, please call our office to schedule the follow-up appointment.   Any Additional Special Instructions Will Be Listed Below (If Applicable).     If you need a refill on your cardiac medications before your next appointment, please call your pharmacy.

## 2016-12-10 ENCOUNTER — Ambulatory Visit (INDEPENDENT_AMBULATORY_CARE_PROVIDER_SITE_OTHER): Payer: Medicare Other | Admitting: *Deleted

## 2016-12-10 DIAGNOSIS — I4821 Permanent atrial fibrillation: Secondary | ICD-10-CM

## 2016-12-10 DIAGNOSIS — Z5181 Encounter for therapeutic drug level monitoring: Secondary | ICD-10-CM | POA: Diagnosis not present

## 2016-12-10 DIAGNOSIS — Z953 Presence of xenogenic heart valve: Secondary | ICD-10-CM

## 2016-12-10 DIAGNOSIS — I482 Chronic atrial fibrillation: Secondary | ICD-10-CM

## 2016-12-10 DIAGNOSIS — I4891 Unspecified atrial fibrillation: Secondary | ICD-10-CM

## 2016-12-10 LAB — POCT INR: INR: 3.6

## 2016-12-15 DIAGNOSIS — H401233 Low-tension glaucoma, bilateral, severe stage: Secondary | ICD-10-CM | POA: Diagnosis not present

## 2016-12-18 ENCOUNTER — Telehealth: Payer: Self-pay | Admitting: *Deleted

## 2016-12-18 NOTE — Telephone Encounter (Signed)
No answer. Left detailed message, ok per DPR, with appt date and time for tomorrow's stress echo. Left instructions not to take her carvedilol tonight or in the morning prior to the procedure and to call back if she has any questions.

## 2016-12-19 ENCOUNTER — Telehealth: Payer: Self-pay | Admitting: Cardiovascular Disease

## 2016-12-19 ENCOUNTER — Encounter: Payer: Self-pay | Admitting: Cardiovascular Disease

## 2016-12-19 ENCOUNTER — Ambulatory Visit: Payer: Medicare Other

## 2016-12-19 ENCOUNTER — Other Ambulatory Visit: Payer: Self-pay | Admitting: Cardiovascular Disease

## 2016-12-19 ENCOUNTER — Ambulatory Visit (INDEPENDENT_AMBULATORY_CARE_PROVIDER_SITE_OTHER): Payer: Medicare Other | Admitting: *Deleted

## 2016-12-19 ENCOUNTER — Other Ambulatory Visit: Payer: Self-pay

## 2016-12-19 ENCOUNTER — Other Ambulatory Visit: Payer: Self-pay | Admitting: Internal Medicine

## 2016-12-19 ENCOUNTER — Ambulatory Visit (INDEPENDENT_AMBULATORY_CARE_PROVIDER_SITE_OTHER): Payer: Medicare Other

## 2016-12-19 ENCOUNTER — Ambulatory Visit (INDEPENDENT_AMBULATORY_CARE_PROVIDER_SITE_OTHER): Payer: Medicare Other | Admitting: Cardiovascular Disease

## 2016-12-19 VITALS — BP 134/80 | HR 73 | Ht 63.5 in | Wt 147.0 lb

## 2016-12-19 DIAGNOSIS — Z01812 Encounter for preprocedural laboratory examination: Secondary | ICD-10-CM

## 2016-12-19 DIAGNOSIS — I5022 Chronic systolic (congestive) heart failure: Secondary | ICD-10-CM

## 2016-12-19 DIAGNOSIS — I059 Rheumatic mitral valve disease, unspecified: Secondary | ICD-10-CM

## 2016-12-19 DIAGNOSIS — Z5181 Encounter for therapeutic drug level monitoring: Secondary | ICD-10-CM

## 2016-12-19 DIAGNOSIS — R0600 Dyspnea, unspecified: Secondary | ICD-10-CM | POA: Diagnosis not present

## 2016-12-19 DIAGNOSIS — I480 Paroxysmal atrial fibrillation: Secondary | ICD-10-CM | POA: Diagnosis not present

## 2016-12-19 DIAGNOSIS — I2589 Other forms of chronic ischemic heart disease: Secondary | ICD-10-CM

## 2016-12-19 DIAGNOSIS — R0602 Shortness of breath: Secondary | ICD-10-CM | POA: Diagnosis not present

## 2016-12-19 DIAGNOSIS — Z953 Presence of xenogenic heart valve: Secondary | ICD-10-CM

## 2016-12-19 DIAGNOSIS — I4891 Unspecified atrial fibrillation: Secondary | ICD-10-CM

## 2016-12-19 LAB — ECHOCARDIOGRAM COMPLETE
Height: 63.5 in
Weight: 2352 oz

## 2016-12-19 LAB — POCT INR: INR: 2.3

## 2016-12-19 NOTE — Progress Notes (Signed)
Cardiology Office Note   Date:  12/19/2016   ID:  Elizabeth Hall, DOB 18-Sep-1940, MRN [id number]  PCP:  Elizabeth Bush, MD  Cardiologist:   Elizabeth Sacramento, MD   Chief Complaint  Patient presents with  . OTHER    F/u echo no stress echo c/o sob. Meds reviewed verbally with pt.      History of Present Illness: Elizabeth Hall is a 76 y.o. female who presents for  a follow-up visit. 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 Congestive heart failure was diagnosed in 2007.  She has known history of atrial fibrillation. She underwent AV nodal ablation and pacemaker placement in November 2013. 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 Her ejection fraction deteriorated and she underwent explantation of the previous pacemaker and implantation of biventricular ICD in March 2015. She had CVA in the past when her warfarin was held for a procedure. Since then, she has been bridged with low molecular weight heparin.  Echocardiogram in 12/2015 showed an ejection fraction of 40-34%, grade 2 diastolic dysfunction, replaced mitral valve with moderate regurgitation and a mean gradient of 5 mmHg. Left atrium was severely dilated. Pulmonary pressure could not be estimated. ABI was normal in 02/2016.  Nuclear stress test in 2017 showed no evidence of ischemia with an EF of 45-54%.    She has been having issues with exertional dyspnea that initially improved after adjusting her medications and switching back to Bumex. However, over the last 6 weeks, she has experienced significant deterioration in exertional dyspnea which is currently happening with minimal activities even going to the mailbox. This has significantly affected her ability to perform activities of daily living. No chest pain, orthopnea or PND. No significant  leg edema.  She was scheduled to have a stress echo today by Dr. Caryl Hall. However, given her cardiomyopathy and valve disease, I felt that the stress echo might not be the best modality. An echocardiogram was done which showed no significant changes from before.   Past Medical History:  Diagnosis Date  . Atrial fibrillation ([place])   . Bleeding of eye   . CAD (coronary artery disease) 03/09/2000   s/p MI 2001  . Chronic systolic heart failure ([place])   . CKD (chronic kidney disease) stage 3, GFR 30-59 ml/min    baseline Cr 1.5-2; prior saw nephrologist thought hypertensive nephropathy/renovascular disease  . CVA (cerebral infarction) 2014   occipital lobe - some vision loss when coumadin held and not bridged  . Dyslipidemia   . Glaucoma   . H. pylori infection    treated  . 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 Hx of rheumatic fever   . Hypertension   . Melanoma ([place]) 2006   L shoulder  . Phlebitis   . Systolic CHF ([place])    EF 74% s/p MVR and TVA 2013  . 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 ABDOMINAL HYSTERECTOMY    . ABLATION  2013  . ATHERECTOMY  06/2012   left arm after coumadin held without bridging  . BREAST BIOPSY    . CARDIOVASCULAR STRESS TEST  03/2016   WNL  .  CATARACT EXTRACTION Bilateral 2007, 2010  . COLONOSCOPY  03/2014   diverticulosis, int hem, rpt 5 yrs for h/o polyps ([place])  . defibrillator     MEDTRONIC 5086 MRI 52 CM LEAD, SERIAL # [id number]  . DEXA  07/2016   WNL  . DG ABDOMEN COMPLETE ([place] [facility])  02/2013   HH, mod severe erosive gastritis, duodenitis  . GALLBLADDER SURGERY  2006  . INSERT / REPLACE / REMOVE PACEMAKER  08/2013  . MITRAL VALVE REPLACEMENT  05/2012  . TOTAL ABDOMINAL HYSTERECTOMY W/ BILATERAL SALPINGOOPHORECTOMY  1991   irregular  bleeding  . TRICUSPID VALVE REPLACEMENT  05/2012  . US ECHOCARDIOGRAPHY  02/2014   EF 40%, LA marked dilation, gen LV hypokinesis, mitral prosthetic valve     Current Outpatient Prescriptions  Medication Sig Dispense Refill  . aspirin 81 MG tablet Take 81 mg by mouth daily.    . bimatoprost (LUMIGAN) 0.03 % ophthalmic solution Place 1 drop into both eyes at bedtime.    . bumetanide (BUMEX) 2 MG tablet TAKE 1 TABLET(2 MG) BY MOUTH DAILY 90 tablet 3  . carvedilol (COREG) 12.5 MG tablet TAKE 1 TABLET(12.5 MG) BY MOUTH TWICE DAILY WITH A MEAL 180 tablet 3  . estradiol (ESTRACE) 1 MG tablet Take 0.5 mg by mouth 2 (two) times a week.     [name] MAGNESIUM PO Take by mouth daily.    . meclizine (ANTIVERT) 25 MG tablet Take 1 tablet (25 mg total) by mouth 2 (two) times daily as needed for dizziness. 180 tablet 0  . sacubitril-valsartan (ENTRESTO) 24-26 MG Take 1 tablet by mouth 2 (two) times daily. 180 tablet 3  . spironolactone (ALDACTONE) 25 MG tablet TAKE 1/2 TABLET(12.5 MG) BY MOUTH DAILY 30 tablet 3  . VITAMIN E PO Take by mouth daily.    [name] warfarin (COUMADIN) 3 MG tablet Take as directed by [facility] 90 tablet 1   No current facility-administered medications for this visit.     Allergies:   Amiodarone; Phytonadione [vitamin k1]; Biaxin [clarithromycin]; Ciprofloxacin; Flagyl [metronidazole]; and Lisinopril    Social History:  The patient  reports that she has quit smoking. Her smoking use included Cigarettes. She has never used smokeless tobacco. She reports that she drinks alcohol. She reports that she does not use drugs.   Family History:  The patient's family history includes CAD (age of onset: 90) in her father; Cancer in her cousin, cousin, and maternal aunt; Diabetes in her cousin; Heart failure in her brother and sister; Hypertension in her brother; Stroke in her brother and sister.    ROS:  Please see the history of present illness.   Otherwise, review of systems are positive for  none.   All other systems are reviewed and negative.    PHYSICAL EXAM: VS:  BP 134/80 (BP Location: Left Arm, Patient Position: Sitting, Cuff Size: Normal)   Pulse 73   Ht 5' 3.5" (1.613 m)   Wt 147 lb (66.7 kg)   LMP  (LMP Unknown) Comment: menopause age 72  BMI 25.63 kg/m  , BMI Body mass index is 25.63 kg/m. GEN: Well nourished, well developed, in no acute distress  HEENT: normal  Neck: no JVD, carotid bruits, or masses Cardiac: RRR; no murmurs, rubs, or gallops,no edema  Respiratory:  clear to auscultation bilaterally, normal work of breathing GI: soft, nontender, nondistended, + BS MS: no deformity or atrophy  Skin: warm and dry, no rash Neuro:  Strength and sensation are intact Psych: euthymic  mood, full affect   EKG:  EKG  not ordered today.    Recent Labs: 02/27/2016: BNP 164.7; TSH 1.690 06/30/2016: BUN 27; Creatinine, Ser 1.30; Potassium 4.6; Sodium 144 07/04/2016: Hemoglobin 12.8; Platelets 209    Lipid Panel    Component Value Date/Time   CHOL 160 06/27/2015 0841   CHOL 192 06/12/2014   TRIG 62.0 06/27/2015 0841   TRIG 70 06/12/2014   HDL 51.60 06/27/2015 0841   CHOLHDL 3 06/27/2015 0841   VLDL 12.4 06/27/2015 0841   LDLCALC 96 06/27/2015 0841   LDLCALC 104 06/12/2014      Wt Readings from Last 3 Encounters:  12/19/16 147 lb (66.7 kg)  11/11/16 152 lb 4 oz (69.1 kg)  08/08/16 151 lb 4 oz (68.6 kg)         ASSESSMENT AND PLAN:  1.  Chronic systolic heart failure:  Most recent ejection fraction was 35-40%.   Significant deterioration in exertional dyspnea which is currently happening with minimal activities and has significantly affected her ability to perform activities of daily living. Echocardiogram today does not show significant change from before. Ejection fraction is still 35-40% with normal functioning replaced mitral valve. Given the significant deterioration in symptoms, I think the best option is to proceed with a right and left cardiac  catheterization. I discussed the procedure in details as well as risk and benefits. She is on Carrithers-term anticoagulation with warfarin and I am planning to continue this except for holding the dose the night before. We'll plan on access via the right arm.  2. Paroxysmal atrial fibrillation: Continue Winer-term anticoagulation with warfarin.   3. Essential hypertension: Blood pressure is well controlled on current medications.  4. Atypical bilateral leg pain: normal ABI  5. Status post mitral valve replacement with bioprosthetic valve: Function was acceptable on most recent echocardiogram. Regurgitation appeared improved from before and currently mild.   Disposition:   FU with me in 1 months  Signed,  Elizabeth Sacramento, MD  12/19/2016 10:29 AM    [place]

## 2016-12-19 NOTE — Telephone Encounter (Signed)
Samples of Entresto were given to the patient, quantity 3 packs, Lot Number [id number] Exp: Nov 2018

## 2016-12-19 NOTE — Telephone Encounter (Signed)
Reviewed cardiac cath instructions w/pt who verbalized understanding. She was unsure if her daughter needed to stay at the hospital the entire time of pt's procedure.

## 2016-12-19 NOTE — Patient Instructions (Addendum)
Medication Instructions:  Your physician recommends that you continue on your current medications as directed. Please refer to the Current Medication list given to you today.   Labwork: BMET, CBC, PT/INR  Testing/Procedures: Your physician has requested that you have a cardiac catheterization. Cardiac catheterization is used to diagnose and/or treat various heart conditions. Doctors may recommend this procedure for a number of different reasons. The most common reason is to evaluate chest pain. Chest pain can be a symptom of coronary artery disease (CAD), and cardiac catheterization can show whether plaque is narrowing or blocking your heart's arteries. This procedure is also used to evaluate the valves, as well as measure the blood flow and oxygen levels in different parts of your heart. For further information please visit [facility]uge[facility]iesta[facility]tn. Please follow instruction sheet, as given.  [facility] Cardiac Cath Instructions   You are scheduled for a Cardiac Cath on: Monday, July 9  Please arrive at 8:30am on the day of your procedure  Please expect a call from our [place] to pre-register you  Do not eat/drink anything after midnight  Someone will need to drive you home  It is recommended someone be with you for the first 24 hours after your procedure  Wear clothes that are easy to get on/off and wear slip on shoes if possible   Medications bring a current list of all medications with you   _x__ Do not take these medications before your procedure: Hold coumadin the day before your procedure.  Do not take bumex or sprinolactone the morning of your procedure.   Day of your procedure: Arrive at the [facility] entrance.  Free valet service is available.  After entering the [place] please check-in at the registration desk (1st desk on your right) to receive your armband. After receiving your armband someone will escort you to the cardiac cath/special procedures  waiting area.  The usual length of stay after your procedure is about 2 to 3 hours.  This can vary.  If you have any questions, please call our office at [phone], or you may call the cardiac cath lab at [facility] directly at [phone]   Follow-Up: Your physician recommends that you schedule a follow-up appointment in: one month with Dr. Fletcher Anon.    Any Other Special Instructions Will Be Listed Below (If Applicable).     If you need a refill on your cardiac medications before your next appointment, please call your pharmacy.   Angiogram An angiogram, also called angiography, is a procedure used to look at the blood vessels. In this procedure, dye is injected through a Ehmke, thin tube (catheter) into an artery. X-rays are then taken. The X-rays will show if there is a blockage or problem in a blood vessel. Tell a health care provider about:  Any allergies you have, including allergies to shellfish or contrast dye.  All medicines you are taking, including vitamins, herbs, eye drops, creams, and over-the-counter medicines.  Any problems you or family members have had with anesthetic medicines.  Any blood disorders you have.  Any surgeries you have had.  Any previous kidney problems or failure you have had.  Any medical conditions you have.  Possibility of pregnancy, if this applies. What are the risks? Generally, an angiogram is a safe procedure. However, as with any procedure, problems can occur. Possible problems include:  Injury to the blood vessels, including rupture or bleeding.  Infection or bruising at the catheter site.  Allergic reaction to the dye or contrast  used.  Kidney damage from the dye or contrast used.  Blood clots that can lead to a stroke or heart attack.  What happens before the procedure?  Do not eat or drink after midnight on the night before the procedure, or as directed by your health care provider.  Ask your health care provider if you may  drink enough water to take any needed medicines the morning of the procedure. What happens during the procedure?  You may be given a medicine to help you relax (sedative) before and during the procedure. This medicine is given through an IV access tube that is inserted into one of your veins.  The area where the catheter will be inserted will be washed and shaved. This is usually done in the groin but may be done in the fold of your arm (near your elbow) or in the wrist.  A medicine will be given to numb the area where the catheter will be inserted (local anesthetic).  The catheter will be inserted with a guide wire into an artery. The catheter is guided by using a type of X-ray (fluoroscopy) to the blood vessel being examined.  Dye is then injected into the catheter, and X-rays are taken. The dye helps to show where any narrowing or blockages are located. What happens after the procedure?  If the procedure is done through the leg, you will be kept in bed lying flat for several hours. You will be instructed to not bend or cross your legs.  The insertion site will be checked frequently.  The pulse in your feet or wrist will be checked frequently.  Additional blood tests, X-rays, and electrocardiography may be done.  You may need to stay in the hospital overnight for observation. This information is not intended to replace advice given to you by your health care provider. Make sure you discuss any questions you have with your health care provider. Document Released: 03/19/2005 Document Revised: 11/21/2015 Document Reviewed: 11/10/2012 Elsevier Interactive Patient Education  2017 [place] After This sheet gives you information about how to care for yourself after your procedure. Your health care provider may also give you more specific instructions. If you have problems or questions, contact your health care provider. What can I expect after the procedure? After the  procedure, it is common to have bruising and tenderness at the catheter insertion area. Follow these instructions at home: Insertion site care  Follow instructions from your health care provider about how to take care of your insertion site. Make sure you: ? Wash your hands with soap and water before you change your bandage (dressing). If soap and water are not available, use hand sanitizer. ? Change your dressing as told by your health care provider. ? Leave stitches (sutures), skin glue, or adhesive strips in place. These skin closures may need to stay in place for 2 weeks or longer. If adhesive strip edges start to loosen and curl up, you may trim the loose edges. Do not remove adhesive strips completely unless your health care provider tells you to do that.  Do not take baths, swim, or use a hot tub until your health care provider approves.  You may shower 24-48 hours after the procedure or as told by your health care provider. ? Gently wash the site with plain soap and water. ? Pat the area dry with a clean towel. ? Do not rub the site. This may cause bleeding.  Do not apply powder or lotion  to the site. Keep the site clean and dry.  Check your insertion site every day for signs of infection. Check for: ? Redness, swelling, or pain. ? Fluid or blood. ? Warmth. ? Pus or a bad smell. Activity  Rest as told by your health care provider, usually for 1-2 days.  Do not lift anything that is heavier than 10 lbs. (4.5 kg) or as told by your health care provider.  Do not drive for 24 hours if you were given a medicine to help you relax (sedative).  Do not drive or use heavy machinery while taking prescription pain medicine. General instructions  Return to your normal activities as told by your health care provider, usually in about a week. Ask your health care provider what activities are safe for you.  If the catheter site starts bleeding, lie flat and put pressure on the site. If  the bleeding does not stop, get help right away. This is a medical emergency.  Drink enough fluid to keep your urine clear or pale yellow. This helps flush the contrast dye from your body.  Take over-the-counter and prescription medicines only as told by your health care provider.  Keep all follow-up visits as told by your health care provider. This is important. Contact a health care provider if:  You have a fever or chills.  You have redness, swelling, or pain around your insertion site.  You have fluid or blood coming from your insertion site.  The insertion site feels warm to the touch.  You have pus or a bad smell coming from your insertion site.  You have bruising around the insertion site.  You notice blood collecting in the tissue around the catheter site (hematoma). The hematoma may be painful to the touch. Get help right away if:  You have severe pain at the catheter insertion area.  The catheter insertion area swells very fast.  The catheter insertion area is bleeding, and the bleeding does not stop when you hold steady pressure on the area.  The area near or just beyond the catheter insertion site becomes pale, cool, tingly, or numb. These symptoms may represent a serious problem that is an emergency. Do not wait to see if the symptoms will go away. Get medical help right away. Call your local emergency services (911 in the [place] Do not drive yourself to the hospital. Summary  After the procedure, it is common to have bruising and tenderness at the catheter insertion area.  After the procedure, it is important to rest and drink plenty of fluids.  Do not take baths, swim, or use a hot tub until your health care provider says it is okay to do so. You may shower 24-48 hours after the procedure or as told by your health care provider.  If the catheter site starts bleeding, lie flat and put pressure on the site. If the bleeding does not stop, get help right away. This  is a medical emergency. This information is not intended to replace advice given to you by your health care provider. Make sure you discuss any questions you have with your health care provider. Document Released: 12/26/2004 Document Revised: 05/14/2016 Document Reviewed: 05/14/2016 Elsevier Interactive Patient Education  2017 [employer].

## 2016-12-19 NOTE — Telephone Encounter (Signed)
°  New Prob  Has some questions regarding upcoming cardiac cath. Please call.

## 2016-12-20 LAB — PROTIME-INR
INR: 2.1 — AB (ref 0.8–1.2)
PROTHROMBIN TIME: 21.6 s — AB (ref 9.1–12.0)

## 2016-12-20 LAB — BASIC METABOLIC PANEL
BUN / CREAT RATIO: 17 (ref 12–28)
BUN: 23 mg/dL (ref 8–27)
CHLORIDE: 102 mmol/L (ref 96–106)
CO2: 22 mmol/L (ref 20–29)
Calcium: 9.9 mg/dL (ref 8.7–10.3)
Creatinine, Ser: 1.35 mg/dL — ABNORMAL HIGH (ref 0.57–1.00)
GFR calc non Af Amer: 39 mL/min/{1.73_m2} — ABNORMAL LOW (ref >59)
GFR, EST AFRICAN AMERICAN: 45 mL/min/{1.73_m2} — AB (ref >59)
Glucose: 86 mg/dL (ref 65–99)
POTASSIUM: 4.7 mmol/L (ref 3.5–5.2)
SODIUM: 143 mmol/L (ref 134–144)

## 2016-12-20 LAB — CBC
HEMATOCRIT: 38.3 % (ref 34.0–46.6)
Hemoglobin: 13.3 g/dL (ref 11.1–15.9)
MCH: 29.8 pg (ref 26.6–33.0)
MCHC: 34.7 g/dL (ref 31.5–35.7)
MCV: 86 fL (ref 79–97)
PLATELETS: 235 10*3/uL (ref 150–379)
RBC: 4.46 x10E6/uL (ref 3.77–5.28)
RDW: 15.1 % (ref 12.3–15.4)
WBC: 5.3 10*3/uL (ref 3.4–10.8)

## 2016-12-29 ENCOUNTER — Other Ambulatory Visit: Payer: Self-pay

## 2016-12-29 ENCOUNTER — Telehealth: Payer: Self-pay | Admitting: Cardiovascular Disease

## 2016-12-29 ENCOUNTER — Encounter
Admission: RE | Disposition: A | Discharge status: Home / Self Care | Payer: Self-pay | Source: Ambulatory Visit | Attending: Cardiovascular Disease

## 2016-12-29 ENCOUNTER — Ambulatory Visit
Admission: RE | Admit: 2016-12-29 | Discharge: 2016-12-29 | Disposition: A | Discharge status: Home / Self Care | Payer: Medicare Other | Source: Ambulatory Visit | Attending: Cardiovascular Disease | Admitting: Cardiovascular Disease

## 2016-12-29 DIAGNOSIS — N183 Chronic kidney disease, stage 3 (moderate): Secondary | ICD-10-CM | POA: Insufficient documentation

## 2016-12-29 DIAGNOSIS — I480 Paroxysmal atrial fibrillation: Secondary | ICD-10-CM | POA: Insufficient documentation

## 2016-12-29 DIAGNOSIS — Z950 Presence of cardiac pacemaker: Secondary | ICD-10-CM | POA: Diagnosis not present

## 2016-12-29 DIAGNOSIS — R0609 Other forms of dyspnea: Secondary | ICD-10-CM | POA: Diagnosis not present

## 2016-12-29 DIAGNOSIS — M79605 Pain in left leg: Secondary | ICD-10-CM | POA: Diagnosis not present

## 2016-12-29 DIAGNOSIS — I5022 Chronic systolic (congestive) heart failure: Secondary | ICD-10-CM | POA: Diagnosis not present

## 2016-12-29 DIAGNOSIS — Z8249 Family history of ischemic heart disease and other diseases of the circulatory system: Secondary | ICD-10-CM | POA: Diagnosis not present

## 2016-12-29 DIAGNOSIS — Z8673 Personal history of transient ischemic attack (TIA), and cerebral infarction without residual deficits: Secondary | ICD-10-CM | POA: Diagnosis not present

## 2016-12-29 DIAGNOSIS — E785 Hyperlipidemia, unspecified: Secondary | ICD-10-CM | POA: Insufficient documentation

## 2016-12-29 DIAGNOSIS — I2589 Other forms of chronic ischemic heart disease: Secondary | ICD-10-CM

## 2016-12-29 DIAGNOSIS — I130 Hypertensive heart and chronic kidney disease with heart failure and stage 1 through stage 4 chronic kidney disease, or unspecified chronic kidney disease: Secondary | ICD-10-CM | POA: Insufficient documentation

## 2016-12-29 DIAGNOSIS — H409 Unspecified glaucoma: Secondary | ICD-10-CM | POA: Diagnosis not present

## 2016-12-29 DIAGNOSIS — Z953 Presence of xenogenic heart valve: Secondary | ICD-10-CM | POA: Diagnosis not present

## 2016-12-29 DIAGNOSIS — Z7901 Long term (current) use of anticoagulants: Secondary | ICD-10-CM | POA: Diagnosis not present

## 2016-12-29 DIAGNOSIS — Z87891 Personal history of nicotine dependence: Secondary | ICD-10-CM | POA: Insufficient documentation

## 2016-12-29 DIAGNOSIS — R0602 Shortness of breath: Secondary | ICD-10-CM

## 2016-12-29 DIAGNOSIS — M79604 Pain in right leg: Secondary | ICD-10-CM | POA: Diagnosis not present

## 2016-12-29 DIAGNOSIS — Z7982 Long term (current) use of aspirin: Secondary | ICD-10-CM | POA: Diagnosis not present

## 2016-12-29 DIAGNOSIS — I2510 Atherosclerotic heart disease of native coronary artery without angina pectoris: Secondary | ICD-10-CM

## 2016-12-29 DIAGNOSIS — I252 Old myocardial infarction: Secondary | ICD-10-CM | POA: Diagnosis not present

## 2016-12-29 DIAGNOSIS — I059 Rheumatic mitral valve disease, unspecified: Secondary | ICD-10-CM | POA: Diagnosis not present

## 2016-12-29 DIAGNOSIS — I428 Other cardiomyopathies: Secondary | ICD-10-CM | POA: Diagnosis not present

## 2016-12-29 HISTORY — PX: RIGHT/LEFT HEART CATH AND CORONARY ANGIOGRAPHY: CATH118266

## 2016-12-29 SURGERY — RIGHT/LEFT HEART CATH AND CORONARY ANGIOGRAPHY
Anesthesia: Moderate Sedation

## 2016-12-29 MED ORDER — SODIUM CHLORIDE 0.9% FLUSH: 3.0000 mL | Freq: Two times a day (BID) | INTRAVENOUS | Status: DC

## 2016-12-29 MED ORDER — FENTANYL CITRATE (PF) 100 MCG/2ML IJ SOLN
INTRAMUSCULAR | Status: AC
  Filled 2016-12-29: qty 2

## 2016-12-29 MED ORDER — SODIUM CHLORIDE 0.9 % IV SOLN: INTRAVENOUS | Status: DC

## 2016-12-29 MED ORDER — ASPIRIN 81 MG PO CHEW: 81.0000 mg | CHEWABLE_TABLET | ORAL | Status: DC

## 2016-12-29 MED ORDER — HEPARIN SODIUM (PORCINE) 1000 UNIT/ML IJ SOLN
INTRAMUSCULAR | Status: DC | PRN
  Administered 2016-12-29: 2000 [IU] via INTRAVENOUS

## 2016-12-29 MED ORDER — SODIUM CHLORIDE 0.9 % IV SOLN: 250.0000 mL | INTRAVENOUS | Status: DC | PRN

## 2016-12-29 MED ORDER — MIDAZOLAM HCL 2 MG/2ML IJ SOLN
INTRAMUSCULAR | Status: DC | PRN
  Administered 2016-12-29: 1 mg via INTRAVENOUS

## 2016-12-29 MED ORDER — VERAPAMIL HCL 2.5 MG/ML IV SOLN
INTRAVENOUS | Status: AC
  Filled 2016-12-29: qty 2

## 2016-12-29 MED ORDER — MIDAZOLAM HCL 2 MG/2ML IJ SOLN
INTRAMUSCULAR | Status: AC
  Filled 2016-12-29: qty 2

## 2016-12-29 MED ORDER — HEPARIN SODIUM (PORCINE) 1000 UNIT/ML IJ SOLN
INTRAMUSCULAR | Status: AC
  Filled 2016-12-29: qty 1

## 2016-12-29 MED ORDER — SODIUM CHLORIDE 0.9 % IV SOLN
INTRAVENOUS | Status: DC
  Administered 2016-12-29: 09:00:00 via INTRAVENOUS

## 2016-12-29 MED ORDER — SODIUM CHLORIDE 0.9% FLUSH: 3.0000 mL | INTRAVENOUS | Status: DC | PRN

## 2016-12-29 MED ORDER — IOPAMIDOL (ISOVUE-300) INJECTION 61%
INTRAVENOUS | Status: DC | PRN
  Administered 2016-12-29: 55 mL via INTRA_ARTERIAL

## 2016-12-29 MED ORDER — FENTANYL CITRATE (PF) 100 MCG/2ML IJ SOLN
INTRAMUSCULAR | Status: DC | PRN
Start: 2016-12-29 — End: 2016-12-29
  Administered 2016-12-29: 25 ug via INTRAVENOUS

## 2016-12-29 SURGICAL SUPPLY — 10 items
CATH BALLN WEDGE 5F 110CM (CATHETERS) ×3 IMPLANT
CATH OPTITORQUE JACKY 4.0 5F (CATHETERS) ×3 IMPLANT
DEVICE RAD TR BAND REGULAR (VASCULAR PRODUCTS) ×3 IMPLANT
GLIDESHEATH SLEND SS 6F .021 (SHEATH) ×6 IMPLANT
GUIDEWIRE EMER 3M J .025X150CM (WIRE) ×3 IMPLANT
KIT MANI 3VAL PERCEP (MISCELLANEOUS) ×3 IMPLANT
KIT RIGHT HEART (MISCELLANEOUS) ×3 IMPLANT
PACK CARDIAC CATH (CUSTOM PROCEDURE TRAY) ×3 IMPLANT
WIRE HITORQ VERSACORE ST 145CM (WIRE) ×3 IMPLANT
WIRE ROSEN-J .035X260CM (WIRE) ×3 IMPLANT

## 2016-12-29 NOTE — Telephone Encounter (Signed)
S/w pt who is agreeable to 8/17 pulmonary appt.  She understands she has been put on the wait list for sooner appt.

## 2016-12-29 NOTE — Telephone Encounter (Signed)
Per Dr. Fletcher Anon, "Cath was fine. Refer to pulmonary for evaluation of dyspnea" Scheduled 8/17, 9:30am.

## 2016-12-29 NOTE — H&P (View-Only) (Signed)
Cardiology Office Note   Date:  12/19/2016   ID:  Elizabeth Hall, DOB Oct 30, 1940, MRN [id number]  PCP:  Ria Bush, MD  Cardiologist:   Kathlyn Sacramento, MD   Chief Complaint  Patient presents with  . OTHER    F/u echo no stress echo c/o sob. Meds reviewed verbally with pt.      History of Present Illness: Elizabeth Hall is a 76 y.o. female who presents for  a follow-up visit. 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 Congestive heart failure was diagnosed in 2007.  She has known history of atrial fibrillation. She underwent AV nodal ablation and pacemaker placement in November 2013. 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 Her ejection fraction deteriorated and she underwent explantation of the previous pacemaker and implantation of biventricular ICD in March 2015. She had CVA in the past when her warfarin was held for a procedure. Since then, she has been bridged with low molecular weight heparin.  Echocardiogram in 12/2015 showed an ejection fraction of 32-99%, grade 2 diastolic dysfunction, replaced mitral valve with moderate regurgitation and a mean gradient of 5 mmHg. Left atrium was severely dilated. Pulmonary pressure could not be estimated. ABI was normal in 02/2016.  Nuclear stress test in 2017 showed no evidence of ischemia with an EF of 45-54%.    She has been having issues with exertional dyspnea that initially improved after adjusting her medications and switching back to Bumex. However, over the last 6 weeks, she has experienced significant deterioration in exertional dyspnea which is currently happening with minimal activities even going to the mailbox. This has significantly affected her ability to perform activities of daily living. No chest pain, orthopnea or PND. No significant  leg edema.  She was scheduled to have a stress echo today by Dr. Caryl Comes. However, given her cardiomyopathy and valve disease, I felt that the stress echo might not be the best modality. An echocardiogram was done which showed no significant changes from before.   Past Medical History:  Diagnosis Date  . Atrial fibrillation ([place])   . Bleeding of eye   . CAD (coronary artery disease) 03/09/2000   s/p MI 2001  . Chronic systolic heart failure ([place])   . CKD (chronic kidney disease) stage 3, GFR 30-59 ml/min    baseline Cr 1.5-2; prior saw nephrologist thought hypertensive nephropathy/renovascular disease  . CVA (cerebral infarction) 2014   occipital lobe - some vision loss when coumadin held and not bridged  . Dyslipidemia   . Glaucoma   . H. pylori infection    treated  . 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 Hx of rheumatic fever   . Hypertension   . Melanoma ([place]) 2006   L shoulder  . Phlebitis   . Systolic CHF ([place])    EF 24% s/p MVR and TVA 2013  . 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 ABDOMINAL HYSTERECTOMY    . ABLATION  2013  . ATHERECTOMY  06/2012   left arm after coumadin held without bridging  . BREAST BIOPSY    . CARDIOVASCULAR STRESS TEST  03/2016   WNL  .  CATARACT EXTRACTION Bilateral 2007, 2010  . COLONOSCOPY  03/2014   diverticulosis, int hem, rpt 5 yrs for h/o polyps ([place])  . defibrillator     MEDTRONIC 5086 MRI 52 CM LEAD, SERIAL # [id number]  . DEXA  07/2016   WNL  . DG ABDOMEN COMPLETE ([facility])  02/2013   HH, mod severe erosive gastritis, duodenitis  . GALLBLADDER SURGERY  2006  . INSERT / REPLACE / REMOVE PACEMAKER  08/2013  . MITRAL VALVE REPLACEMENT  05/2012  . TOTAL ABDOMINAL HYSTERECTOMY W/ BILATERAL SALPINGOOPHORECTOMY  1991   irregular  bleeding  . TRICUSPID VALVE REPLACEMENT  05/2012  . US ECHOCARDIOGRAPHY  02/2014   EF 40%, LA marked dilation, gen LV hypokinesis, mitral prosthetic valve     Current Outpatient Prescriptions  Medication Sig Dispense Refill  . aspirin 81 MG tablet Take 81 mg by mouth daily.    . bimatoprost (LUMIGAN) 0.03 % ophthalmic solution Place 1 drop into both eyes at bedtime.    . bumetanide (BUMEX) 2 MG tablet TAKE 1 TABLET(2 MG) BY MOUTH DAILY 90 tablet 3  . carvedilol (COREG) 12.5 MG tablet TAKE 1 TABLET(12.5 MG) BY MOUTH TWICE DAILY WITH A MEAL 180 tablet 3  . estradiol (ESTRACE) 1 MG tablet Take 0.5 mg by mouth 2 (two) times a week.     [name] MAGNESIUM PO Take by mouth daily.    . meclizine (ANTIVERT) 25 MG tablet Take 1 tablet (25 mg total) by mouth 2 (two) times daily as needed for dizziness. 180 tablet 0  . sacubitril-valsartan (ENTRESTO) 24-26 MG Take 1 tablet by mouth 2 (two) times daily. 180 tablet 3  . spironolactone (ALDACTONE) 25 MG tablet TAKE 1/2 TABLET(12.5 MG) BY MOUTH DAILY 30 tablet 3  . VITAMIN E PO Take by mouth daily.    [name] warfarin (COUMADIN) 3 MG tablet Take as directed by [facility] 90 tablet 1   No current facility-administered medications for this visit.     Allergies:   Amiodarone; Phytonadione [vitamin k1]; Biaxin [clarithromycin]; Ciprofloxacin; Flagyl [metronidazole]; and Lisinopril    Social History:  The patient  reports that she has quit smoking. Her smoking use included Cigarettes. She has never used smokeless tobacco. She reports that she drinks alcohol. She reports that she does not use drugs.   Family History:  The patient's family history includes CAD (age of onset: 31) in her father; Cancer in her cousin, cousin, and maternal aunt; Diabetes in her cousin; Heart failure in her brother and sister; Hypertension in her brother; Stroke in her brother and sister.    ROS:  Please see the history of present illness.   Otherwise, review of systems are positive for  none.   All other systems are reviewed and negative.    PHYSICAL EXAM: VS:  BP 134/80 (BP Location: Left Arm, Patient Position: Sitting, Cuff Size: Normal)   Pulse 73   Ht 5' 3.5" (1.613 m)   Wt 147 lb (66.7 kg)   LMP  (LMP Unknown) Comment: menopause age 48  BMI 25.63 kg/m  , BMI Body mass index is 25.63 kg/m. GEN: Well nourished, well developed, in no acute distress  HEENT: normal  Neck: no JVD, carotid bruits, or masses Cardiac: RRR; no murmurs, rubs, or gallops,no edema  Respiratory:  clear to auscultation bilaterally, normal work of breathing GI: soft, nontender, nondistended, + BS MS: no deformity or atrophy  Skin: warm and dry, no rash Neuro:  Strength and sensation are intact Psych: euthymic  mood, full affect   EKG:  EKG  not ordered today.    Recent Labs: 02/27/2016: BNP 164.7; TSH 1.690 06/30/2016: BUN 27; Creatinine, Ser 1.30; Potassium 4.6; Sodium 144 07/04/2016: Hemoglobin 12.8; Platelets 209    Lipid Panel    Component Value Date/Time   CHOL 160 06/27/2015 0841   CHOL 192 06/12/2014   TRIG 62.0 06/27/2015 0841   TRIG 70 06/12/2014   HDL 51.60 06/27/2015 0841   CHOLHDL 3 06/27/2015 0841   VLDL 12.4 06/27/2015 0841   LDLCALC 96 06/27/2015 0841   LDLCALC 104 06/12/2014      Wt Readings from Last 3 Encounters:  12/19/16 147 lb (66.7 kg)  11/11/16 152 lb 4 oz (69.1 kg)  08/08/16 151 lb 4 oz (68.6 kg)         ASSESSMENT AND PLAN:  1.  Chronic systolic heart failure:  Most recent ejection fraction was 35-40%.   Significant deterioration in exertional dyspnea which is currently happening with minimal activities and has significantly affected her ability to perform activities of daily living. Echocardiogram today does not show significant change from before. Ejection fraction is still 35-40% with normal functioning replaced mitral valve. Given the significant deterioration in symptoms, I think the best option is to proceed with a right and left cardiac  catheterization. I discussed the procedure in details as well as risk and benefits. She is on Mcmanigal-term anticoagulation with warfarin and I am planning to continue this except for holding the dose the night before. We'll plan on access via the right arm.  2. Paroxysmal atrial fibrillation: Continue Pate-term anticoagulation with warfarin.   3. Essential hypertension: Blood pressure is well controlled on current medications.  4. Atypical bilateral leg pain: normal ABI  5. Status post mitral valve replacement with bioprosthetic valve: Function was acceptable on most recent echocardiogram. Regurgitation appeared improved from before and currently mild.   Disposition:   FU with me in 1 months  Signed,  Kathlyn Sacramento, MD  12/19/2016 10:29 AM    [place]

## 2016-12-29 NOTE — Interval H&P Note (Signed)
History and Physical Interval Note:  12/29/2016 9:21 AM  Elizabeth Hall  has presented today for surgery, with the diagnosis of Right and Left Heart Cath  The various methods of treatment have been discussed with the patient and family. After consideration of risks, benefits and other options for treatment, the patient has consented to  Procedure(s): Right/Left Heart Cath and Coronary Angiography (N/A) as a surgical intervention .  The patient's history has been reviewed, patient examined, no change in status, stable for surgery.  I have reviewed the patient's chart and labs.  Questions were answered to the patient's satisfaction.     Kathlyn Sacramento

## 2016-12-31 ENCOUNTER — Ambulatory Visit (INDEPENDENT_AMBULATORY_CARE_PROVIDER_SITE_OTHER): Payer: Medicare Other | Admitting: *Deleted

## 2016-12-31 DIAGNOSIS — I2589 Other forms of chronic ischemic heart disease: Secondary | ICD-10-CM | POA: Diagnosis not present

## 2016-12-31 DIAGNOSIS — I4821 Permanent atrial fibrillation: Secondary | ICD-10-CM

## 2016-12-31 DIAGNOSIS — I482 Chronic atrial fibrillation: Secondary | ICD-10-CM | POA: Diagnosis not present

## 2016-12-31 DIAGNOSIS — Z5181 Encounter for therapeutic drug level monitoring: Secondary | ICD-10-CM

## 2016-12-31 DIAGNOSIS — I4891 Unspecified atrial fibrillation: Secondary | ICD-10-CM | POA: Diagnosis not present

## 2016-12-31 DIAGNOSIS — Z953 Presence of xenogenic heart valve: Secondary | ICD-10-CM | POA: Diagnosis not present

## 2016-12-31 LAB — POCT INR: INR: 2.1

## 2017-01-02 ENCOUNTER — Telehealth: Payer: Self-pay | Admitting: Cardiovascular Disease

## 2017-01-02 NOTE — Telephone Encounter (Signed)
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

## 2017-01-02 NOTE — Telephone Encounter (Signed)
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 She will not be moving any carts or picking up boxes. States it will be less work there that what she has been doing at home. Reviewed with Dr. Rockey Situ who is agreeable with the plan and states she may RTW tomorrow. Pt appreciative

## 2017-01-14 ENCOUNTER — Ambulatory Visit (INDEPENDENT_AMBULATORY_CARE_PROVIDER_SITE_OTHER): Payer: Medicare Other

## 2017-01-14 DIAGNOSIS — I4891 Unspecified atrial fibrillation: Secondary | ICD-10-CM | POA: Diagnosis not present

## 2017-01-14 DIAGNOSIS — Z953 Presence of xenogenic heart valve: Secondary | ICD-10-CM

## 2017-01-14 DIAGNOSIS — Z5181 Encounter for therapeutic drug level monitoring: Secondary | ICD-10-CM

## 2017-01-14 DIAGNOSIS — I2589 Other forms of chronic ischemic heart disease: Secondary | ICD-10-CM

## 2017-01-14 LAB — POCT INR: INR: 2.9

## 2017-01-20 ENCOUNTER — Telehealth: Payer: Self-pay | Admitting: Cardiovascular Disease

## 2017-01-20 NOTE — Telephone Encounter (Signed)
Pt would like Entresto samples.  

## 2017-01-20 NOTE — Telephone Encounter (Signed)
Entresto 24-26 mg samples placed at front desk for pick up.

## 2017-01-21 ENCOUNTER — Ambulatory Visit (INDEPENDENT_AMBULATORY_CARE_PROVIDER_SITE_OTHER): Payer: Medicare Other | Admitting: Nurse Practitioner

## 2017-01-21 ENCOUNTER — Encounter: Payer: Self-pay | Admitting: Nurse Practitioner

## 2017-01-21 VITALS — BP 100/60 | HR 75 | Ht 63.0 in | Wt 153.0 lb

## 2017-01-21 DIAGNOSIS — I428 Other cardiomyopathies: Secondary | ICD-10-CM | POA: Diagnosis not present

## 2017-01-21 DIAGNOSIS — I4821 Permanent atrial fibrillation: Secondary | ICD-10-CM

## 2017-01-21 DIAGNOSIS — I5022 Chronic systolic (congestive) heart failure: Secondary | ICD-10-CM

## 2017-01-21 DIAGNOSIS — I2589 Other forms of chronic ischemic heart disease: Secondary | ICD-10-CM

## 2017-01-21 DIAGNOSIS — I482 Chronic atrial fibrillation: Secondary | ICD-10-CM | POA: Diagnosis not present

## 2017-01-21 NOTE — Patient Instructions (Signed)
Medication Instructions:  Your physician has recommended you make the following change in your medication:  1. Take 1/2 Bumex for weight gain of 3 lbs or more.    Follow-Up: Your physician recommends that you schedule a follow-up appointment in: 3 months with Dr. Fletcher Anon.   It was a pleasure seeing you today here in the office. Please do not hesitate to give [place] a call back if you have any further questions. [place], BSN

## 2017-01-21 NOTE — Progress Notes (Signed)
Office Visit    Patient Name: Elizabeth Hall Date of Encounter: 01/21/2017  Primary Care Provider:  Ria Bush, MD Primary Cardiologist:  Jerilynn Mages. Fletcher Anon, MD   Chief Complaint    76 year old female with a prior history of nonischemic cardiomyopathy status post biventricular ICD placement, HFrEF, permanent atrial fibrillation status post AV nodal ablation mitral valve disease status post bioprosthetic replacement and tricuspid valve annuloplasty in 2013, hypertension, hyperlipidemia, and stroke in the setting of Coumadin being held in 2014, who presents for follow-up after recent catheterization.  Past Medical History    Past Medical History:  Diagnosis Date  . Bleeding of eye   . Chronic systolic heart failure ([place])    a. 11/2016 Echo: EF 35-40%, mild MR w/ nl fxning bioprosthesis, mod dil LA.  . CKD (chronic kidney disease) stage 3, GFR 30-59 ml/min    baseline Cr 1.5-2; prior saw nephrologist thought hypertensive nephropathy/renovascular disease  . CVA (cerebral infarction)    a. 2014 occipital lobe - some vision loss when coumadin held and not bridged  . Dyslipidemia   . Glaucoma   . H. pylori infection    treated  . H/O mitral valve replacement    a. 05/2012 s/p 6mm Carpentier Edwards pericardial tissue valve.  [name] Hx of rheumatic fever   . Hypertension   . Melanoma ([place]) 2006   L shoulder  . NICM (nonischemic cardiomyopathy) ([place])    a. 08/2013 s/p Biotronik Ilesto 7 HF1 BiV ICD (ser# [id number]);  b. 11/2016 Echo: EF 35-40%.  . Non-obstructive CAD    a. s/p MI 2001;  b. 12/2016 Cath: LM 20ost, OM2 60, RCA 30p/d, EF 35-45%. Nl R heart filling pressures.  . Permanent atrial fibrillation ([place])    a. 04/2012 s/p AVN & PPM placement (later upgraded to BiV ICD).  [name] Phlebitis   . 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 06/2012 h/o acute ischemia due to thromboembolism radial and ulnar arteries when coumadin held s/p atherectomy - needs bridging if off coumadin    . Tricuspid regurgitation    a. 05/2012 s/p TV annuloplasty @ time of MVR.   Past Surgical History:  Procedure Laterality Date  . ABDOMINAL HYSTERECTOMY    . ABLATION  2013  . ATHERECTOMY  06/2012   left arm after coumadin held without bridging  . BREAST BIOPSY    . CARDIOVASCULAR STRESS TEST  03/2016   WNL  . CATARACT EXTRACTION Bilateral 2007, 2010  . COLONOSCOPY  03/2014   diverticulosis, int hem, rpt 5 yrs for h/o polyps ([place])  . defibrillator     MEDTRONIC 5086 MRI 52 CM LEAD, SERIAL # [id number] [id number]  . DEXA  07/2016   WNL  . DG ABDOMEN COMPLETE ([facility]X)  02/2013   HH, mod severe erosive gastritis, duodenitis  . GALLBLADDER SURGERY  2006  . INSERT / REPLACE / REMOVE PACEMAKER  08/2013  . MITRAL VALVE REPLACEMENT  05/2012  . RIGHT/LEFT HEART CATH AND CORONARY ANGIOGRAPHY N/A 12/29/2016   Procedure: Right/Left Heart Cath and Coronary Angiography;  Surgeon: [name], MD;  Location: [facility];  Service: Cardiovascular;  Laterality: N/A;  . TOTAL ABDOMINAL HYSTERECTOMY W/ BILATERAL SALPINGOOPHORECTOMY  1991   irregular bleeding  . TRICUSPID VALVE REPLACEMENT  05/2012  . US ECHOCARDIOGRAPHY  02/2014   EF 40%, LA marked dilation, gen LV hypokinesis, mitral prosthetic valve    Allergies  Allergies  Allergen Reactions  . Amiodarone Other (See Comments)    "  Irritable"--IV INFUSION   . Phytonadione [Vitamin K1] Other (See Comments)    "extremely faint"--IV INFUSION  . Biaxin [Clarithromycin] Rash    Rash while on biaxin, cipro, flagyl (unsure which was inciting agent)  . Ciprofloxacin Rash    Rash while on biaxin, cipro, flagyl (unsure which was inciting agent)  . Flagyl [Metronidazole] Rash    Rash while on biaxin, cipro, flagyl (unsure which was inciting agent)  . Lisinopril Rash    History of Present Illness    76 year old female with a prior history of nonischemic cardiomyopathy status post biventricular ICD placement, HFrEF, permanent atrial  fibrillation status post AV nodal ablation mitral valve disease status post bioprosthetic replacement and tricuspid valve annuloplasty in 2013, hypertension, hyperlipidemia, and stroke in the setting of Coumadin being held in 2014. She was diagnosed with congestive heart failure in 2007 and this was later determined to be nonischemic. 2013, in the setting of atrial fibrillation, she underwent pacemaker placement and AV nodal ablation. She also underwent mitral valve repair with a 29 mm Carpentier-Edwards pericardial valve, along with tricuspid valve annuloplasty December 2017. In the setting of LV dysfunction, her pacemaker switched out for a biventricular ICD in March 2015. 2014, she suffered a stroke in the setting of Coumadin being on hold for procedure. As result, she is now chronically bridged with Lovenox when necessary.  She was recently seen in clinic with a 6 week history of progressive dyspnea on exertion. Decision was made to pursue a right and left heart cardiac catheterization, which was performed earlier this month and showed nonobstructive CAD with relatively normal right heart pressures. Continued medical therapy was recommended. Since then, she has done relatively well. She has good days and less good days. She still has some dyspnea on exertion. She says her weight ranges between 148 and 152 pounds. She says she generally prepares her own meals and also never goes out to eat. She does not add salt to her food. 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 She is aware that these are generally processed foods and higher insult. She denies chest pain, PND, palpitations, orthopnea, dizziness, syncope, edema, or early satiety.  Home Medications    Prior to Admission medications   Medication Sig Start Date End Date Taking? Authorizing Provider  acetaminophen (TYLENOL) 500 MG tablet Take 500-1,000 mg by mouth every 6 (six) hours as needed (for  headaches/pain.).   Yes [provider]  aspirin EC 81 MG tablet Take 81 mg by mouth daily.   Yes [provider]  bumetanide (BUMEX) 2 MG tablet TAKE 1 TABLET(2 MG) BY MOUTH DAILY 07/07/16  Yes [name], MD  carvedilol (COREG) 12.5 MG tablet TAKE 1 TABLET(12.5 MG) BY MOUTH TWICE DAILY WITH A MEAL 07/18/16  Yes [name], MD  estradiol (ESTRACE) 1 MG tablet Take 0.5 mg by mouth 2 (two) times a week.    Yes [provider]  latanoprost (XALATAN) 0.005 % ophthalmic solution Place 1 drop into both eyes at bedtime. 12/15/16  Yes [provider]  Magnesium 250 MG TABS Take 250 mg by mouth daily.   Yes [provider]  meclizine (ANTIVERT) 25 MG tablet Take 1 tablet (25 mg total) by mouth 2 (two) times daily as needed for dizziness. 10/10/16  Yes [name], MD  Polyethyl Glycol-Propyl Glycol (LUBRICANT EYE DROPS) 0.4-0.3 % SOLN Place 1 drop into both eyes 3 (three) times daily as needed (for dry eyes.).  Yes [provider]  sacubitril-valsartan (ENTRESTO) 24-26 MG Take 1 tablet by mouth 2 (two) times daily. 09/11/16  Yes [name], MD  spironolactone (ALDACTONE) 25 MG tablet TAKE 1/2 TABLET(12.5 MG) BY MOUTH DAILY 06/17/16  Yes [name], MD  vitamin E 400 UNIT capsule Take 400 Units by mouth daily.   Yes [provider]  warfarin (COUMADIN) 3 MG tablet Take as directed by [facility] Patient taking differently: Take 1.5-3 mg by mouth daily. TAKE 0.5 tablet (1.5 MG) ON Monday & Friday mornings, then take 1 tablet (3 MG) daily in the morning on all other days. 11/03/16  Yes [name], MD    Review of Systems    She continues to have some degree of dyspnea on exertion which has been stable over the past 6 weeks. She denies chest pain, palpitations, PND, orthopnea, dizziness, syncope, edema, or early satiety.  All other systems reviewed and are otherwise negative except as noted above.  Physical  Exam    VS:  BP 100/60 (BP Location: Left Arm, Patient Position: Sitting, Cuff Size: Normal)   Pulse 75   Ht 5\' 3"  (1.6 m)   Wt 153 lb (69.4 kg)   LMP  (LMP Unknown) Comment: menopause age 8  BMI 27.10 kg/m  , BMI Body mass index is 27.1 kg/m. GEN: Well nourished, well developed, in no acute distress.  HEENT: normal.  Neck: Supple, no JVD, carotid bruits, or masses. Cardiac: RRR, no murmurs, rubs, or gallops. No clubbing, cyanosis, edema.  Radials/DP/PT 2+ and equal bilaterally.  Respiratory:  Respirations regular and unlabored, clear to auscultation bilaterally. GI: Soft, nontender, nondistended, BS + x 4. MS: no deformity or atrophy. Skin: warm and dry, no rash. Neuro:  Strength and sensation are intact. Psych: Normal affect.  Accessory Clinical Findings    ECG - By V paced, 75  Assessment & Plan    1.  HFrEF/nonischemic cardiomyopathy: Patient has a 6-8 week history of stable dyspnea on exertion. She recently underwent right and left heart cardiac catheterization revealing relatively normal filling pressures and nonobstructive CAD. She says her weights of been stable at home with a low of 148 and a max of about 152 on her scale. She has asked if it would be okay to take an additional half of the Bumex on days when weight is up 3 pounds, as she previously did this when followed in [place]. 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 I agreed that she may use an additional half of the Bumex in the afternoons when weight is over 150. Otherwise, she remains on beta blocker, entresto, and spiro Rx.  No room for titration today given soft blood pressure.  2.  Atrial fibrillation: Status post by the ICD and AV nodal ablation. She is anticoagulated with Coumadin and does require bridging a time she is off of  Coumadin secondary to prior history of stroke.  3. Essential hypertension: Blood pressure soft today. No changes.  4. History of atypical bilateral leg pain: Previously normal ABIs.  5. Status post mitral valve replacement with bioprosthetic valve and tricuspid valve annuloplasty: Stable valves on most recent echo.  6. Disposition: Patient will follow-up with Dr. Fletcher Anon in 2-3 months or sooner if necessary. She knows to call us if she is  frequently having to use  when necessary afternoon Bumex.  Murray Hodgkins, NP 01/21/2017, 9:38 AM

## 2017-02-01 ENCOUNTER — Other Ambulatory Visit: Payer: Self-pay | Admitting: Cardiovascular Disease

## 2017-02-04 ENCOUNTER — Ambulatory Visit (INDEPENDENT_AMBULATORY_CARE_PROVIDER_SITE_OTHER): Payer: Medicare Other

## 2017-02-04 DIAGNOSIS — I2589 Other forms of chronic ischemic heart disease: Secondary | ICD-10-CM

## 2017-02-04 DIAGNOSIS — I4891 Unspecified atrial fibrillation: Secondary | ICD-10-CM | POA: Diagnosis not present

## 2017-02-04 DIAGNOSIS — Z953 Presence of xenogenic heart valve: Secondary | ICD-10-CM

## 2017-02-04 DIAGNOSIS — Z5181 Encounter for therapeutic drug level monitoring: Secondary | ICD-10-CM

## 2017-02-04 LAB — POCT INR: INR: 3.3

## 2017-02-06 ENCOUNTER — Encounter: Payer: Self-pay | Admitting: Pulmonary Disease

## 2017-02-06 ENCOUNTER — Ambulatory Visit (INDEPENDENT_AMBULATORY_CARE_PROVIDER_SITE_OTHER): Payer: Medicare Other | Admitting: Pulmonary Disease

## 2017-02-06 VITALS — BP 100/70 | HR 79 | Resp 16 | Ht 65.0 in | Wt 152.0 lb

## 2017-02-06 DIAGNOSIS — R0609 Other forms of dyspnea: Secondary | ICD-10-CM

## 2017-02-06 DIAGNOSIS — I429 Cardiomyopathy, unspecified: Secondary | ICD-10-CM | POA: Diagnosis not present

## 2017-02-06 DIAGNOSIS — I2589 Other forms of chronic ischemic heart disease: Secondary | ICD-10-CM

## 2017-02-06 DIAGNOSIS — Z87891 Personal history of nicotine dependence: Secondary | ICD-10-CM

## 2017-02-06 DIAGNOSIS — Z86718 Personal history of other venous thrombosis and embolism: Secondary | ICD-10-CM

## 2017-02-06 MED ORDER — FLUTICASONE FUROATE-VILANTEROL 100-25 MCG/INH IN AEPB: 1.0000 | INHALATION_SPRAY | Freq: Every day | RESPIRATORY_TRACT | 0 refills | Status: DC

## 2017-02-06 NOTE — Progress Notes (Signed)
Patient seen in the office today and instructed on use of Breo.  Patient expressed understanding and demonstrated technique.  

## 2017-02-06 NOTE — Patient Instructions (Addendum)
Trial of Breo inhaler - one inhalation daily. Use 1 week on, one-week off, etc. until follow-up visit  Lung function tests have been ordered  Follow-up in 6 weeks or so to review your response to the Breo inhaler and to review your lung function tests  If shortness of breath is still poorly explained, we can consider cardiopulmonary stress test

## 2017-02-10 ENCOUNTER — Ambulatory Visit (INDEPENDENT_AMBULATORY_CARE_PROVIDER_SITE_OTHER): Payer: Medicare Other | Admitting: *Deleted

## 2017-02-10 DIAGNOSIS — I428 Other cardiomyopathies: Secondary | ICD-10-CM

## 2017-02-10 DIAGNOSIS — I5022 Chronic systolic (congestive) heart failure: Secondary | ICD-10-CM

## 2017-02-10 NOTE — Progress Notes (Signed)
PULMONARY CONSULT NOTE  Requesting MD/Service: Fletcher Anon Date of initial consultation: 02/06/17 Reason for consultation: DOE  PT PROFILE: 76 y.o. female former smoker referred by cardiology for evaluation of DOE after extensive cardiac evaluation  HPI:  As above. She reports notable exertional dyspnea over the past 2-3 months. She describes her dyspnea as class 2-3. She can walk no more than 1 flight of stairs before feeling out of breath she denies chest pain, cough, hemoptysis, paroxysmal nocturnal dyspnea, orthopnea she does have "throat mucus". She has significant cardiac history having undergone mitral valve replacement in 2013. She feels that her present symptoms are similar to the symptoms that she was experiencing at that time. She has a past medical history of rheumatic fever, paroxysmal atrial fibrillation. Her cardiac evaluation has included a left heart catheterization which was reportedly "okay". Records indicate that her LVEF was estimated at 35-45%. LVEDP was okay. There was nonobstructive coronary disease. Right heart cath was also performed which revealed no evidence of pulmonary hypertension. She has a prior history of DVT in 2013. This was at the time of her hospitalization for mitral valve replacement. She is presently on warfarin. She has never undergone Pulmicort function testing. She has never been tried on inhaled medications. She smoked less than one pack cigarettes per day for approximately 20 years and quit approximately 30 years ago.  Past Medical History:  Diagnosis Date  . Bleeding of eye   . Chronic systolic heart failure ([place])    a. 11/2016 Echo: EF 35-40%, mild MR w/ nl fxning bioprosthesis, mod dil LA.  . CKD (chronic kidney disease) stage 3, GFR 30-59 ml/min    baseline Cr 1.5-2; prior saw nephrologist thought hypertensive nephropathy/renovascular disease  . CVA (cerebral infarction)    a. 2014 occipital lobe - some vision loss when coumadin held and not bridged  .  Dyslipidemia   . Glaucoma   . H. pylori infection    treated  . H/O mitral valve replacement    a. 05/2012 s/p 38mm Carpentier Edwards pericardial tissue valve.  [name] Hx of rheumatic fever   . Hypertension   . Melanoma ([place]) 2006   L shoulder  . NICM (nonischemic cardiomyopathy) ([place])    a. 08/2013 s/p Biotronik Ilesto 7 HF1 BiV ICD (ser# [id number]);  b. 11/2016 Echo: EF 35-40%.  . Non-obstructive CAD    a. s/p MI 2001;  b. 12/2016 Cath: LM 20ost, OM2 60, RCA 30p/d, EF 35-45%. Nl R heart filling pressures.  . Permanent atrial fibrillation ([place])    a. 04/2012 s/p AVN & PPM placement (later upgraded to BiV ICD).  [name] Phlebitis   . 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 06/2012 h/o acute ischemia due to thromboembolism radial and ulnar arteries when coumadin held s/p atherectomy - needs bridging if off coumadin  . Tricuspid regurgitation    a. 05/2012 s/p TV annuloplasty @ time of MVR.    Past Surgical History:  Procedure Laterality Date  . ABDOMINAL HYSTERECTOMY    . ABLATION  2013  . ATHERECTOMY  06/2012   left arm after coumadin held without bridging  . BREAST BIOPSY    . CARDIOVASCULAR STRESS TEST  03/2016   WNL  . CATARACT EXTRACTION Bilateral 2007, 2010  . COLONOSCOPY  03/2014   diverticulosis, int hem, rpt 5 yrs for h/o polyps ([place])  . defibrillator     MEDTRONIC 5086 MRI 52 CM LEAD, SERIAL # [id number]  . DEXA  07/2016   WNL  .  DG ABDOMEN COMPLETE ([place] [facility])  02/2013   HH, mod severe erosive gastritis, duodenitis  . GALLBLADDER SURGERY  2006  . INSERT / REPLACE / REMOVE PACEMAKER  08/2013  . MITRAL VALVE REPLACEMENT  05/2012  . RIGHT/LEFT HEART CATH AND CORONARY ANGIOGRAPHY N/A 12/29/2016   Procedure: Right/Left Heart Cath and Coronary Angiography;  Surgeon: [name], MD;  Location: [facility];  Service: Cardiovascular;  Laterality: N/A;  . TOTAL ABDOMINAL HYSTERECTOMY W/ BILATERAL SALPINGOOPHORECTOMY  1991   irregular bleeding  . TRICUSPID VALVE  REPLACEMENT  05/2012  . US ECHOCARDIOGRAPHY  02/2014   EF 40%, LA marked dilation, gen LV hypokinesis, mitral prosthetic valve    MEDICATIONS: I have reviewed all medications and confirmed regimen as documented  Social History   Social History  . Marital status: Married    Spouse name: N/A  . Number of children: N/A  . Years of education: N/A   Occupational History  . Not on file.   Social History Main Topics  . Smoking status: Former Smoker    Types: Cigarettes  . Smokeless tobacco: Never Used  . Alcohol use 0.0 oz/week     Comment: occasional  . Drug use: No  . Sexual activity: Not Currently    Birth control/ protection: None   Other Topics Concern  . 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 CAD Father 32       MI  . Hypertension Brother   . Heart failure Brother        CHF with pacemaker  . Stroke Brother   . Heart failure Sister        CHF  . Stroke Sister   . Diabetes Cousin   . Cancer Maternal Aunt        colon  . Cancer Cousin        breast  . Cancer Cousin        brain tumor    ROS: No fever, myalgias/arthralgias, unexplained weight loss or weight gain No new focal weakness or sensory deficits No otalgia, hearing loss, visual changes, nasal and sinus symptoms, mouth and throat problems No neck pain or adenopathy No abdominal pain, N/V/D, diarrhea, change in bowel pattern No dysuria, change in urinary pattern   Vitals:   02/06/17 0937  BP: 100/70  Pulse: 79  Resp: 16  SpO2: 97%  Weight: 152 lb (68.9 kg)  Height: 5\' 5"  (1.651 m)     EXAM:  Gen: WDWN in NAD HEENT: NCAT, sclerae white, oropharynx normal Neck: NO LAN, no JVD noted Lungs: full BS, normal percussion note throughout, no  adventitious sounds Cardiovascular: Normal rate, regular rhythm, no M noted Abdomen: Soft, NT, +BS Ext: no C/C/E, extensive varicosities on both lower extremities, L>R Neuro: PERRL, EOMI, motor/sensory grossly intact Skin: No lesions noted   DATA:   BMP Latest Ref Rng & Units 12/19/2016 06/30/2016 03/26/2016  Glucose 65 - 99 mg/dL 86 112(H) 97  BUN 8 - 27 mg/dL 23 27 26   Creatinine 0.57 - 1.00 mg/dL 1.35(H) 1.30(H) 1.31(H)  BUN/Creat Ratio 12 - 28 17 21 20   Sodium 134 - 144 mmol/L 143 144 142  Potassium 3.5 - 5.2 mmol/L  4.7 4.6 4.6  Chloride 96 - 106 mmol/L 102 102 105  CO2 20 - 29 mmol/L 22 24 20   Calcium 8.7 - 10.3 mg/dL 9.9 9.4 9.3    CBC Latest Ref Rng & Units 12/19/2016 07/04/2016 02/27/2016  WBC 3.4 - 10.8 x10E3/uL 5.3 6.9 5.9  Hemoglobin 11.1 - 15.9 g/dL 13.3 12.8 13.5  Hematocrit 34.0 - 46.6 % 38.3 38.3 39.3  Platelets 150 - 379 x10E3/uL 235 209 241    CXR 11/11/16:  No acute cardiac or pulmonary findings  IMPRESSION:     ICD-10-CM   1. DOE (dyspnea on exertion) R06.09 Pulmonary Function Test [facility] Only  2. Cardiomyopathy, unspecified type ([place]) I42.9   3. Former smoker Z87.891 Pulmonary Function Test [facility] Only  4. History of DVT (deep vein thrombosis) Z86.718    Moderate exertional dyspnea which appears to have progressed in the past few months. She does have mild left ventricular dysfunction and valvular heart disease. However, an extensive cardiac workup performed by Dr. Fletcher Anon suggests that this is not the full explanation for her exertional dyspnea. She does have a history of DVT but is on chronic warfarin and there is no evidence of pulmonary hypertension on right heart cath. She is a former smoker and has never undergone evaluation for possible COPD. It is noted however that her smoking history is fairly unimpressive  PLAN:  Trial of Breo inhaler - one inhalation daily. 2 samples provided. She is to use this 1 week on, one-week off, etc. until follow-up visit  PFTs  ordered  Follow-up in 6 weeks to review response to [facility] and review PFT results  If shortness of breath remains poorly explained, we should consider cardiopulmonary stress test   [name], MD PCCM service Mobile [phone] Pager [phone] 02/10/2017 3:08 PM

## 2017-02-11 NOTE — Progress Notes (Signed)
Remote ICD transmission.   

## 2017-02-16 DIAGNOSIS — H401233 Low-tension glaucoma, bilateral, severe stage: Secondary | ICD-10-CM | POA: Diagnosis not present

## 2017-02-20 ENCOUNTER — Encounter: Payer: Self-pay | Admitting: Cardiology

## 2017-02-24 ENCOUNTER — Telehealth: Payer: Self-pay | Admitting: Cardiovascular Disease

## 2017-02-24 NOTE — Telephone Encounter (Signed)
Please see note below. Forms not in CMA box. Pt requesting copy of forms dropped off.

## 2017-02-24 NOTE — Telephone Encounter (Signed)
Pt states she will need a copy of forms she dropped off, and she will be in on 9/10 for coumadin check, and will get them then.

## 2017-02-24 NOTE — Telephone Encounter (Signed)
Elizabeth Hall patient assistance form to [employer], [employer] [phone].  Original forms in envelope at front desk awaiting pt pickup. Pt notified

## 2017-02-24 NOTE — Telephone Encounter (Signed)
Pt calling needing a call back It is about her Entresto  She wouldn't give much detail Please call back

## 2017-02-24 NOTE — Telephone Encounter (Signed)
Pt has [employer] Patient Assistance form needing MD signature and prescription for entresto. She will bring by the office this afternoon for MD completion. We will fax paperwork to [employer].

## 2017-02-24 NOTE — Telephone Encounter (Signed)
Patient came to pick up samples and dropped off medication assistance forms.  Placed in cma box.

## 2017-03-02 ENCOUNTER — Ambulatory Visit (INDEPENDENT_AMBULATORY_CARE_PROVIDER_SITE_OTHER): Payer: Medicare Other

## 2017-03-02 DIAGNOSIS — I4821 Permanent atrial fibrillation: Secondary | ICD-10-CM

## 2017-03-02 DIAGNOSIS — I4891 Unspecified atrial fibrillation: Secondary | ICD-10-CM | POA: Diagnosis not present

## 2017-03-02 DIAGNOSIS — Z953 Presence of xenogenic heart valve: Secondary | ICD-10-CM

## 2017-03-02 DIAGNOSIS — I482 Chronic atrial fibrillation: Secondary | ICD-10-CM

## 2017-03-02 DIAGNOSIS — Z5181 Encounter for therapeutic drug level monitoring: Secondary | ICD-10-CM

## 2017-03-02 LAB — POCT INR: INR: 2.8

## 2017-03-03 ENCOUNTER — Telehealth: Payer: Self-pay | Admitting: Cardiovascular Disease

## 2017-03-03 DIAGNOSIS — M65331 Trigger finger, right middle finger: Secondary | ICD-10-CM | POA: Diagnosis not present

## 2017-03-03 NOTE — Telephone Encounter (Signed)
Returned call to pt, advised pt there is little systemic effects from steroid injection in hand, should not interact with Warfarin.  No need for sooner INR check, keep scheduled appt.  Advised TCB if started on oral Prednisone for any reason because that will interact with Coumadin and she will need sooner appt if has to take oral steroid.  Pt verbalized understanding.

## 2017-03-03 NOTE — Telephone Encounter (Signed)
Pt calling stating she's had a steroid injection in hand  Needed to know if she needs to come sooner for Coumadin check  Please call back

## 2017-03-05 ENCOUNTER — Other Ambulatory Visit: Payer: Self-pay

## 2017-03-05 ENCOUNTER — Telehealth: Payer: Self-pay | Admitting: Cardiovascular Disease

## 2017-03-05 MED ORDER — SACUBITRIL-VALSARTAN 24-26 MG PO TABS: 1.0000 | ORAL_TABLET | Freq: Two times a day (BID) | ORAL | 3 refills | Status: DC

## 2017-03-05 NOTE — Telephone Encounter (Signed)
Received notification via fax from [employer] Patient Environmental consultant of approval for [employer].  Faxed prescription to [phone] Patient ID: [id number] Left detailed message on pt's cell VM

## 2017-03-10 ENCOUNTER — Ambulatory Visit: Payer: Medicare Other | Attending: Pulmonary Disease

## 2017-03-10 DIAGNOSIS — Z87891 Personal history of nicotine dependence: Secondary | ICD-10-CM | POA: Diagnosis not present

## 2017-03-10 DIAGNOSIS — R0600 Dyspnea, unspecified: Secondary | ICD-10-CM | POA: Diagnosis not present

## 2017-03-10 DIAGNOSIS — R0609 Other forms of dyspnea: Secondary | ICD-10-CM

## 2017-03-16 ENCOUNTER — Ambulatory Visit (INDEPENDENT_AMBULATORY_CARE_PROVIDER_SITE_OTHER): Payer: Medicare Other | Admitting: Pulmonary Disease

## 2017-03-16 ENCOUNTER — Encounter: Payer: Self-pay | Admitting: Pulmonary Disease

## 2017-03-16 VITALS — BP 108/70 | HR 77 | Resp 16 | Ht 65.0 in | Wt 150.0 lb

## 2017-03-16 DIAGNOSIS — R0609 Other forms of dyspnea: Secondary | ICD-10-CM | POA: Diagnosis not present

## 2017-03-16 DIAGNOSIS — I255 Ischemic cardiomyopathy: Secondary | ICD-10-CM

## 2017-03-16 DIAGNOSIS — I2589 Other forms of chronic ischemic heart disease: Secondary | ICD-10-CM | POA: Diagnosis not present

## 2017-03-16 NOTE — Patient Instructions (Signed)
Referral made to cardiac rehabilitation program  I will discuss with Dr. Fletcher Anon  Follow-up as needed

## 2017-03-17 NOTE — Progress Notes (Signed)
PULMONARY OFFICE FOLLOW UP NOTE  Requesting MD/Service: Elizabeth Hall Date of initial consultation: 02/06/17 Reason for consultation: DOE  PT PROFILE: 76 y.o. female former smoker referred by cardiology for evaluation of DOE after extensive cardiac evaluation  DATA: 11/11/16 CXR: No evident cardiac or pulmonary disease 12/19/16 Echocardiogram: mild concentric LVH. LVEF 35-40%. Bioprosthetic MV. LA moderately dilated. PA pressures could not be estimated 03/10/17 PFTs:  Normal spirometry, normal lung volumes, minimally reduced DLCO which corrects to normal for [facility]  SUBJ:  Here to review results of PFTs and assess response to trial of Breo inhaler. She does not believe that the [facility] reduced her DOE in amy discernible way. There is no change in her symptoms overall. She continues to report class II/III dyspnea. She recovers quickly with rest. Denies CP, orthopnea, PND, cough, sputum. She has occasional LE edema.    Vitals:   03/16/17 1019 03/16/17 1022  BP:  108/70  Pulse:  77  Resp: 16   SpO2:  96%  Weight: 68 kg (150 lb)   Height: 5\' 5"  (1.651 m)   RA  EXAM:  Gen: NAD HEENT: WNL Neck: No LAN, no JVD noted Lungs: full BS, no adventitious sounds Cardiovascular: Regular, no M noted Abdomen: Soft, NT, +BS Ext: no C/C/E, extensive varicosities L>R Neuro: grossly intact  DATA:   BMP Latest Ref Rng & Units 12/19/2016 06/30/2016 03/26/2016  Glucose 65 - 99 mg/dL 86 112(H) 97  BUN 8 - 27 mg/dL 23 27 26   Creatinine 0.57 - 1.00 mg/dL 1.35(H) 1.30(H) 1.31(H)  BUN/Creat Ratio 12 - 28 17 21 20   Sodium 134 - 144 mmol/L 143 144 142  Potassium 3.5 - 5.2 mmol/L 4.7 4.6 4.6  Chloride 96 - 106 mmol/L 102 102 105  CO2 20 - 29 mmol/L 22 24 20   Calcium 8.7 - 10.3 mg/dL 9.9 9.4 9.3    CBC Latest Ref Rng & Units 12/19/2016 07/04/2016 02/27/2016  WBC 3.4 - 10.8 x10E3/uL 5.3 6.9 5.9  Hemoglobin 11.1 - 15.9 g/dL 13.3 12.8 13.5  Hematocrit 34.0 - 46.6 % 38.3 38.3 39.3  Platelets 150 - 379 x10E3/uL 235 209  241    CXR:  No new film  PFTs: as above  IMPRESSION:     ICD-10-CM   1. DOE (dyspnea on exertion) R06.09   2. Cardiomyopathy I25.5    There is no evident pulmonary cause or contributor to her DOE. Suspect it is all due to cardiomyopathy  PLAN:  Referral made to cardiac rehab - she has gone through this previously and found it to be very beneficial Follow up as needed   [name], MD PCCM service Mobile [phone] Pager [phone] 03/17/2017 8:03 PM

## 2017-03-24 ENCOUNTER — Telehealth: Payer: Self-pay | Admitting: Pulmonary Disease

## 2017-03-24 NOTE — Telephone Encounter (Signed)
Referral was sent on 03/16/17. LMOAM for Rehab to return my call. Rhonda J Cobb

## 2017-03-24 NOTE — Telephone Encounter (Signed)
Pt is calling to check status of cardiac rehab suggested by Dr. Fletcher Anon. Please call.

## 2017-03-24 NOTE — Telephone Encounter (Signed)
Referral has been received at Rehab and is waiting on review to schedule. Advised patient to allow about 2 weeks and if rehab has not been scheduled to contact us back to follow up on. Pt voiced understanding and stated that she would. Nothing else needed at this time. Rhonda J Cobb

## 2017-03-24 NOTE — Telephone Encounter (Signed)
Referral was placed by Dr. Alva Garnet on 03/16/17. Please f/u on scheduling.

## 2017-03-27 DIAGNOSIS — Z23 Encounter for immunization: Secondary | ICD-10-CM | POA: Diagnosis not present

## 2017-03-30 ENCOUNTER — Ambulatory Visit (INDEPENDENT_AMBULATORY_CARE_PROVIDER_SITE_OTHER): Payer: Medicare Other

## 2017-03-30 DIAGNOSIS — I4891 Unspecified atrial fibrillation: Secondary | ICD-10-CM | POA: Diagnosis not present

## 2017-03-30 DIAGNOSIS — Z5181 Encounter for therapeutic drug level monitoring: Secondary | ICD-10-CM | POA: Diagnosis not present

## 2017-03-30 DIAGNOSIS — Z953 Presence of xenogenic heart valve: Secondary | ICD-10-CM

## 2017-03-30 DIAGNOSIS — I482 Chronic atrial fibrillation: Secondary | ICD-10-CM

## 2017-03-30 DIAGNOSIS — I4821 Permanent atrial fibrillation: Secondary | ICD-10-CM

## 2017-03-30 LAB — POCT INR: INR: 3.5

## 2017-03-31 LAB — CUP PACEART REMOTE DEVICE CHECK
Date Time Interrogation Session: 20181009064600
HighPow Impedance: 52 Ohm
Implantable Lead Implant Date: 20150316
Implantable Lead Implant Date: 20150316
Implantable Lead Location: 753858
Implantable Lead Location: 753860
Implantable Lead Model: 346
Implantable Lead Serial Number: 25130583
Lead Channel Pacing Threshold Pulse Width: 0.4 ms
Lead Channel Setting Pacing Amplitude: 2.5 V
Lead Channel Setting Pacing Amplitude: 2.5 V
Lead Channel Setting Pacing Pulse Width: 0.4 ms
MDC IDC MSMT BATTERY REMAINING PERCENTAGE: 71 %
MDC IDC MSMT LEADCHNL LV IMPEDANCE VALUE: 522 Ohm
MDC IDC MSMT LEADCHNL RV IMPEDANCE VALUE: 443 Ohm
MDC IDC MSMT LEADCHNL RV PACING THRESHOLD AMPLITUDE: 0.7 V
MDC IDC PG IMPLANT DT: 20150316
MDC IDC SET LEADCHNL LV SENSING SENSITIVITY: 1.6 mV
MDC IDC SET LEADCHNL RV PACING PULSEWIDTH: 0.4 ms
MDC IDC SET LEADCHNL RV SENSING SENSITIVITY: 0.8 mV
MDC IDC STAT BRADY RV PERCENT PACED: 99 %
Pulse Gen Model: 383547
Pulse Gen Serial Number: 60765179

## 2017-04-01 ENCOUNTER — Other Ambulatory Visit: Payer: Self-pay | Admitting: *Deleted

## 2017-04-01 DIAGNOSIS — I509 Heart failure, unspecified: Secondary | ICD-10-CM

## 2017-04-20 ENCOUNTER — Other Ambulatory Visit: Payer: Self-pay

## 2017-04-20 ENCOUNTER — Ambulatory Visit (INDEPENDENT_AMBULATORY_CARE_PROVIDER_SITE_OTHER): Payer: Medicare Other

## 2017-04-20 ENCOUNTER — Encounter: Payer: Medicare Other | Attending: Pulmonary Disease

## 2017-04-20 VITALS — Ht 63.5 in | Wt 153.7 lb

## 2017-04-20 DIAGNOSIS — Z7982 Long term (current) use of aspirin: Secondary | ICD-10-CM | POA: Insufficient documentation

## 2017-04-20 DIAGNOSIS — E785 Hyperlipidemia, unspecified: Secondary | ICD-10-CM | POA: Insufficient documentation

## 2017-04-20 DIAGNOSIS — Z8672 Personal history of thrombophlebitis: Secondary | ICD-10-CM | POA: Insufficient documentation

## 2017-04-20 DIAGNOSIS — I482 Chronic atrial fibrillation: Secondary | ICD-10-CM | POA: Insufficient documentation

## 2017-04-20 DIAGNOSIS — Z953 Presence of xenogenic heart valve: Secondary | ICD-10-CM | POA: Diagnosis not present

## 2017-04-20 DIAGNOSIS — Z7901 Long term (current) use of anticoagulants: Secondary | ICD-10-CM | POA: Insufficient documentation

## 2017-04-20 DIAGNOSIS — I509 Heart failure, unspecified: Secondary | ICD-10-CM

## 2017-04-20 DIAGNOSIS — Z85820 Personal history of malignant melanoma of skin: Secondary | ICD-10-CM | POA: Diagnosis not present

## 2017-04-20 DIAGNOSIS — Z7951 Long term (current) use of inhaled steroids: Secondary | ICD-10-CM | POA: Diagnosis not present

## 2017-04-20 DIAGNOSIS — Z87891 Personal history of nicotine dependence: Secondary | ICD-10-CM | POA: Diagnosis not present

## 2017-04-20 DIAGNOSIS — I130 Hypertensive heart and chronic kidney disease with heart failure and stage 1 through stage 4 chronic kidney disease, or unspecified chronic kidney disease: Secondary | ICD-10-CM | POA: Insufficient documentation

## 2017-04-20 DIAGNOSIS — Z5181 Encounter for therapeutic drug level monitoring: Secondary | ICD-10-CM

## 2017-04-20 DIAGNOSIS — I4891 Unspecified atrial fibrillation: Secondary | ICD-10-CM

## 2017-04-20 DIAGNOSIS — Z79899 Other long term (current) drug therapy: Secondary | ICD-10-CM | POA: Insufficient documentation

## 2017-04-20 DIAGNOSIS — Z8673 Personal history of transient ischemic attack (TIA), and cerebral infarction without residual deficits: Secondary | ICD-10-CM | POA: Insufficient documentation

## 2017-04-20 DIAGNOSIS — Z86718 Personal history of other venous thrombosis and embolism: Secondary | ICD-10-CM | POA: Insufficient documentation

## 2017-04-20 DIAGNOSIS — I428 Other cardiomyopathies: Secondary | ICD-10-CM | POA: Insufficient documentation

## 2017-04-20 DIAGNOSIS — H409 Unspecified glaucoma: Secondary | ICD-10-CM | POA: Diagnosis not present

## 2017-04-20 DIAGNOSIS — N183 Chronic kidney disease, stage 3 (moderate): Secondary | ICD-10-CM | POA: Diagnosis not present

## 2017-04-20 DIAGNOSIS — I5022 Chronic systolic (congestive) heart failure: Secondary | ICD-10-CM | POA: Diagnosis not present

## 2017-04-20 DIAGNOSIS — I2510 Atherosclerotic heart disease of native coronary artery without angina pectoris: Secondary | ICD-10-CM | POA: Diagnosis not present

## 2017-04-20 DIAGNOSIS — Z95810 Presence of automatic (implantable) cardiac defibrillator: Secondary | ICD-10-CM | POA: Diagnosis not present

## 2017-04-20 DIAGNOSIS — I4821 Permanent atrial fibrillation: Secondary | ICD-10-CM

## 2017-04-20 LAB — POCT INR: INR: 3.4

## 2017-04-20 MED ORDER — CARVEDILOL 12.5 MG PO TABS: 12.5000 mg | ORAL_TABLET | Freq: Two times a day (BID) | ORAL | 3 refills | Status: DC

## 2017-04-20 NOTE — Patient Instructions (Signed)
Patient Instructions  Patient Details  Name: Elizabeth Hall MRN: [id number] Date of Birth: 1941/05/10 Referring Provider:  Wilhelmina Mcardle, MD  Below are the personal goals you chose as well as exercise and nutrition goals. Our goal is to help you keep on track towards obtaining and maintaining your goals. We will be discussing your progress on these goals with you throughout the program.  Initial Exercise Prescription:     Initial Exercise Prescription - 04/20/17 1500      Date of Initial Exercise RX and Referring Provider   Date 04/20/17   Referring Provider [name] MD     Treadmill   MPH 2.1   Grade 0   Minutes 15   METs 2.61     NuStep   Level 2   SPM 80   Minutes 15   METs 2.6     REL-XR   Level 1   Speed 50   Minutes 15   METs 2.6     Prescription Details   Frequency (times per week) 3   Duration Progress to 45 minutes of aerobic exercise without signs/symptoms of physical distress     Intensity   THRR 40-80% of Max Heartrate 103-132   Ratings of Perceived Exertion 11-13   Perceived Dyspnea 0-4     Progression   Progression Continue to progress workloads to maintain intensity without signs/symptoms of physical distress.     Resistance Training   Training Prescription Yes   Weight 3 lbs   Reps 10-15      Exercise Goals: Frequency: Be able to perform aerobic exercise three times per week working toward 3-5 days per week.  Intensity: Work with a perceived exertion of 11 (fairly light) - 15 (hard) as tolerated. Follow your new exercise prescription and watch for changes in prescription as you progress with the program. Changes will be reviewed with you when they are made.  Duration: You should be able to do 30 minutes of continuous aerobic exercise in addition to a 5 minute warm-up and a 5 minute cool-down routine.  Nutrition Goals: Your personal nutrition goals will be established when you do your nutrition analysis with the  dietician.  The following are nutrition guidelines to follow: Cholesterol < 200mg /day Sodium < 1500mg /day Fiber: Women over 50 yrs - 21 grams per day  Personal Goals:     Personal Goals and Risk Factors at Admission - 04/20/17 1505      Core Components/Risk Factors/Patient Goals on Admission    Weight Management Yes;Weight Loss   Intervention Weight Management: Develop a combined nutrition and exercise program designed to reach desired caloric intake, while maintaining appropriate intake of nutrient and fiber, sodium and fats, and appropriate energy expenditure required for the weight goal.;Weight Management: Provide education and appropriate resources to help participant work on and attain dietary goals.;Weight Management/Obesity: Establish reasonable short term and Creger term weight goals.   Admit Weight 153 lb 11.2 oz (69.7 kg)   Goal Weight: Short Term 148 lb (67.1 kg)   Goal Weight: Starkes Term 135 lb (61.2 kg)   Expected Outcomes [place] Term: Adherence to nutrition and physical activity/exercise program aimed toward attainment of established weight goal;Weight Maintenance: Understanding of the daily nutrition guidelines, which includes 25-35% calories from fat, 7% or less cal from saturated fats, less than 200mg  cholesterol, less than 1.5gm of sodium, & 5 or more servings of fruits and vegetables daily;Short Term: Continue to assess and modify interventions until short term weight is achieved;Weight Loss:  Understanding of general recommendations for a balanced deficit meal plan, which promotes 1-2 lb weight loss per week and includes a negative energy balance of [phone] kcal/d;Understanding recommendations for meals to include 15-35% energy as protein, 25-35% energy from fat, 35-60% energy from carbohydrates, less than 200mg  of dietary cholesterol, 20-35 gm of total fiber daily;Understanding of distribution of calorie intake throughout the day with the consumption of 4-5 meals/snacks   Improve  shortness of breath with ADL's Yes   Intervention Provide education, individualized exercise plan and daily activity instruction to help decrease symptoms of SOB with activities of daily living.   Expected Outcomes Short Term: Achieves a reduction of symptoms when performing activities of daily living.   Heart Failure Yes   Intervention Provide a combined exercise and nutrition program that is supplemented with education, support and counseling about heart failure. Directed toward relieving symptoms such as shortness of breath, decreased exercise tolerance, and extremity edema.   Expected Outcomes Improve functional capacity of life;Short term: Attendance in program 2-3 days a week with increased exercise capacity. Reported lower sodium intake. Reported increased fruit and vegetable intake. Reports medication compliance.;Short term: Daily weights obtained and reported for increase. Utilizing diuretic protocols set by physician.;Kassner term: Adoption of self-care skills and reduction of barriers for early signs and symptoms recognition and intervention leading to self-care maintenance.   Hypertension Yes   Intervention Provide education on lifestyle modifcations including regular physical activity/exercise, weight management, moderate sodium restriction and increased consumption of fresh fruit, vegetables, and low fat dairy, alcohol moderation, and smoking cessation.;Monitor prescription use compliance.   Expected Outcomes [place] Term: Maintenance of blood pressure at goal levels.;Short Term: Continued assessment and intervention until BP is < 140/14mm HG in hypertensive participants. < 130/36mm HG in hypertensive participants with diabetes, heart failure or chronic kidney disease.   Stress Yes   Intervention Offer individual and/or small group education and counseling on adjustment to heart disease, stress management and health-related lifestyle change. Teach and support self-help strategies.;Refer  participants experiencing significant psychosocial distress to appropriate mental health specialists for further evaluation and treatment. When possible, include family members and significant others in education/counseling sessions.   Expected Outcomes Short Term: Participant demonstrates changes in health-related behavior, relaxation and other stress management skills, ability to obtain effective social support, and compliance with psychotropic medications if prescribed.;[place] Term: Emotional wellbeing is indicated by absence of clinically significant psychosocial distress or social isolation.      Tobacco Use Initial Evaluation: History  Smoking Status  . Former Smoker  . Packs/day: 0.50  . Years: 20.00  . Types: Cigarettes  . Quit date: 02/04/1990  Smokeless Tobacco  . Never Used    Exercise Goals and Review:     Exercise Goals    Row Name 04/20/17 1558             Exercise Goals   Increase Physical Activity Yes       Intervention Provide advice, education, support and counseling about physical activity/exercise needs.;Develop an individualized exercise prescription for aerobic and resistive training based on initial evaluation findings, risk stratification, comorbidities and participant's personal goals.       Expected Outcomes Achievement of increased cardiorespiratory fitness and enhanced flexibility, muscular endurance and strength shown through measurements of functional capacity and personal statement of participant.       Increase Strength and Stamina Yes       Intervention Provide advice, education, support and counseling about physical activity/exercise needs.;Develop an individualized exercise prescription for aerobic and  resistive training based on initial evaluation findings, risk stratification, comorbidities and participant's personal goals.       Expected Outcomes Achievement of increased cardiorespiratory fitness and enhanced flexibility, muscular endurance and  strength shown through measurements of functional capacity and personal statement of participant.       Able to understand and use rate of perceived exertion (RPE) scale Yes       Intervention Provide education and explanation on how to use RPE scale       Expected Outcomes Short Term: Able to use RPE daily in rehab to express subjective intensity level;[place] Term:  Able to use RPE to guide intensity level when exercising independently       Able to understand and use Dyspnea scale Yes       Intervention Provide education and explanation on how to use Dyspnea scale       Expected Outcomes Short Term: Able to use Dyspnea scale daily in rehab to express subjective sense of shortness of breath during exertion;[place] Term: Able to use Dyspnea scale to guide intensity level when exercising independently       Knowledge and understanding of Target Heart Rate Range (THRR) Yes       Intervention Provide education and explanation of THRR including how the numbers were predicted and where they are located for reference       Expected Outcomes Short Term: Able to state/look up THRR;[place] Term: Able to use THRR to govern intensity when exercising independently;Short Term: Able to use daily as guideline for intensity in rehab       Able to check pulse independently Yes       Intervention Provide education and demonstration on how to check pulse in carotid and radial arteries.;Review the importance of being able to check your own pulse for safety during independent exercise       Expected Outcomes Short Term: Able to explain why pulse checking is important during independent exercise;[place] Term: Able to check pulse independently and accurately       Understanding of Exercise Prescription Yes       Intervention Provide education, explanation, and written materials on patient's individual exercise prescription       Expected Outcomes Short Term: Able to explain program exercise prescription;[place] Term: Able to explain home  exercise prescription to exercise independently          Copy of goals given to participant.

## 2017-04-20 NOTE — Telephone Encounter (Signed)
Requested Prescriptions   Signed Prescriptions Disp Refills  . carvedilol (COREG) 12.5 MG tablet 180 tablet 3    Sig: Take 1 tablet (12.5 mg total) by mouth 2 (two) times daily with a meal.    Authorizing Provider: Kathlyn [place] A    Ordering User: Janan Ridge

## 2017-04-20 NOTE — Progress Notes (Signed)
Pulmonary Individual Treatment Plan  Patient Details  Name: Elizabeth Hall MRN: [id number] Date of Birth: 04-26-1941 Referring Provider:     Pulmonary Rehab from 04/20/2017 in [facility]: [name] Cardiac and Pulmonary Rehab  Referring Provider  [name] MD      Initial Encounter Date:    Pulmonary Rehab from 04/20/2017 in [facility] Cardiac and Pulmonary Rehab  Date  04/20/17  Referring Provider  [name] MD      Visit Diagnosis: Chronic congestive heart failure, unspecified heart failure type [facility])  Patient's Home Medications on Admission:  Current Outpatient Prescriptions:  .  acetaminophen (TYLENOL) 500 MG tablet, Take 500-1,000 mg by mouth every 6 (six) hours as needed (for headaches/pain.)., Disp: , Rfl:  .  aspirin EC 81 MG tablet, Take 81 mg by mouth daily., Disp: , Rfl:  .  bumetanide (BUMEX) 2 MG tablet, TAKE 1 TABLET(2 MG) BY MOUTH DAILY, Disp: 90 tablet, Rfl: 3 .  carvedilol (COREG) 12.5 MG tablet, Take 1 tablet (12.5 mg total) by mouth 2 (two) times daily with a meal., Disp: 180 tablet, Rfl: 3 .  estradiol (ESTRACE) 1 MG tablet, Take 0.5 mg by mouth 2 (two) times a week. , Disp: , Rfl:  .  fluticasone furoate-vilanterol (BREO ELLIPTA) 100-25 MCG/INH AEPB, Inhale 1 puff into the lungs daily., Disp: 60 each, Rfl: 0 .  latanoprost (XALATAN) 0.005 % ophthalmic solution, Place 1 drop into both eyes at bedtime., Disp: , Rfl: 99 .  Magnesium 250 MG TABS, Take 250 mg by mouth daily., Disp: , Rfl:  .  meclizine (ANTIVERT) 25 MG tablet, Take 1 tablet (25 mg total) by mouth 2 (two) times daily as needed for dizziness., Disp: 180 tablet, Rfl: 0 .  Polyethyl Glycol-Propyl Glycol (LUBRICANT EYE DROPS) 0.4-0.3 % SOLN, Place 1 drop into both eyes 3 (three) times daily as needed (for dry eyes.)., Disp: , Rfl:  .  sacubitril-valsartan (ENTRESTO) 24-26 MG, Take 1 tablet by mouth 2 (two) times daily., Disp: 180 tablet, Rfl: 3 .  spironolactone (ALDACTONE) 25 MG tablet, TAKE 1/2 TABLET(12.5 MG)  BY MOUTH DAILY, Disp: 30 tablet, Rfl: 2 .  vitamin E 400 UNIT capsule, Take 400 Units by mouth daily., Disp: , Rfl:  .  warfarin (COUMADIN) 3 MG tablet, Take as directed by [facility] (Patient taking differently: Take 1.5-3 mg by mouth daily. TAKE 0.5 tablet (1.5 MG) ON Monday & Friday mornings, then take 1 tablet (3 MG) daily in the morning on all other days.), Disp: 90 tablet, Rfl: 1  Past Medical History: Past Medical History:  Diagnosis Date  . Bleeding of eye   . Chronic systolic heart failure ([place])    a. 11/2016 Echo: EF 35-40%, mild MR w/ nl fxning bioprosthesis, mod dil LA.  . CKD (chronic kidney disease) stage 3, GFR 30-59 ml/min (HCC)    baseline Cr 1.5-2; prior saw nephrologist thought hypertensive nephropathy/renovascular disease  . CVA (cerebral infarction)    a. 2014 occipital lobe - some vision loss when coumadin held and not bridged  . Dyslipidemia   . Glaucoma   . H. pylori infection    treated  . H/O mitral valve replacement    a. 05/2012 s/p 73mm Carpentier Edwards pericardial tissue valve.  [name] Hx of rheumatic fever   . Hypertension   . Melanoma ([place]) 2006   L shoulder  . NICM (nonischemic cardiomyopathy) ([place])    a. 08/2013 s/p Biotronik Ilesto 7 HF1 BiV ICD (ser# [id number]);  b. 11/2016 Echo: EF 35-40%.  [name]  Non-obstructive CAD    a. s/p MI 2001;  b. 12/2016 Cath: LM 20ost, OM2 60, RCA 30p/d, EF 35-45%. Nl R heart filling pressures.  . Permanent atrial fibrillation ([place])    a. 04/2012 s/p AVN & PPM placement (later upgraded to BiV ICD).  [name] Phlebitis   . 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 06/2012 h/o acute ischemia due to thromboembolism radial and ulnar arteries when coumadin held s/p atherectomy - needs bridging if off coumadin  . Tricuspid regurgitation    a. 05/2012 s/p TV annuloplasty @ time of MVR.    Tobacco Use: History  Smoking Status  . Former Smoker  . Packs/day: 0.50  . Years: 20.00  . Types: Cigarettes  . Quit date: 02/04/1990   Smokeless Tobacco  . Never Used    Labs: Recent Review Flowsheet Data    Labs for ITP Cardiac and Pulmonary Rehab Latest Ref Rng & Units 06/12/2014 06/27/2015 07/07/2016   Cholestrol 0 - 200 mg/dL 192 160 -   LDLCALC 0 - 99 mg/dL 104 96 -   HDL >39.00 mg/dL 74(A) 51.60 -   Trlycerides 0.0 - 149.0 mg/dL 70 62.0 -   Hemoglobin A1c 4.6 - 6.5 % - 6.2 6.1       Pulmonary Assessment Scores:     Pulmonary Assessment Scores    Row Name 04/20/17 1453         ADL UCSD   ADL Phase Entry     SOB Score total 42     Rest 0     Walk 1     Stairs 4     Bath 1     Dress 2     Shop 3       CAT Score   CAT Score 12       mMRC Score   mMRC Score 3        Pulmonary Function Assessment:     Pulmonary Function Assessment - 04/20/17 1457      Breath   Bilateral Breath Sounds Clear   Shortness of Breath No;Limiting activity  Her shortness of breath make it more of an annoyance for her      Exercise Target Goals: Date: 04/20/17  Exercise Program Goal: Individual exercise prescription set with THRR, safety & activity barriers. Participant demonstrates ability to understand and report RPE using BORG scale, to self-measure pulse accurately, and to acknowledge the importance of the exercise prescription.  Exercise Prescription Goal: Starting with aerobic activity 30 plus minutes a day, 3 days per week for initial exercise prescription. Provide home exercise prescription and guidelines that participant acknowledges understanding prior to discharge.  Activity Barriers & Risk Stratification:     Activity Barriers & Cardiac Risk Stratification - 04/20/17 1555      Activity Barriers & Cardiac Risk Stratification   Activity Barriers Muscular Weakness;Shortness of Breath;Deconditioning;Balance Concerns;Decreased Ventricular Function      6 Minute Walk:     6 Minute Walk    Row Name 04/20/17 1553         6 Minute Walk   Phase Initial     Distance 1260 feet     Walk Time 6  minutes     # of Rest Breaks 0     MPH 2.39     METS 2.6     RPE 11     Perceived Dyspnea  1.5     VO2 Peak 9.09     Symptoms No  Resting HR 74 bpm     Resting BP 122/70     Resting Oxygen Saturation  98 %     Exercise Oxygen Saturation  during 6 min walk 93 %     Max Ex. HR 106 bpm     Max Ex. BP 126/60     2 Minute Post BP 122/64       Interval HR   1 Minute HR 104     2 Minute HR 101     3 Minute HR 91     4 Minute HR 86     5 Minute HR 98     6 Minute HR 106     2 Minute Post HR 76     Interval Heart Rate? Yes       Interval Oxygen   Interval Oxygen? Yes     Baseline Oxygen Saturation % 98 %     1 Minute Oxygen Saturation % 94 %     1 Minute Liters of Oxygen 0 L  Room Air     2 Minute Oxygen Saturation % 94 %     2 Minute Liters of Oxygen 0 L     3 Minute Oxygen Saturation % 95 %     3 Minute Liters of Oxygen 0 L     4 Minute Oxygen Saturation % 94 %     4 Minute Liters of Oxygen 0 L     5 Minute Oxygen Saturation % 94 %     5 Minute Liters of Oxygen 0 L     6 Minute Oxygen Saturation % 93 %     6 Minute Liters of Oxygen 0 L     2 Minute Post Oxygen Saturation % 100 %     2 Minute Post Liters of Oxygen 0 L       Oxygen Initial Assessment:     Oxygen Initial Assessment - 04/20/17 1504      Home Oxygen   Home Oxygen Device None   Sleep Oxygen Prescription None   Home Exercise Oxygen Prescription None   Home at Rest Exercise Oxygen Prescription None     Initial 6 min Walk   Oxygen Used None     Program Oxygen Prescription   Program Oxygen Prescription None     Intervention   Short Term Goals To learn and understand importance of maintaining oxygen saturations>88%;To learn and demonstrate proper pursed lip breathing techniques or other breathing techniques.;To learn and understand importance of monitoring SPO2 with pulse oximeter and demonstrate accurate use of the pulse oximeter.   [place]  Term Goals Exhibits proper breathing techniques, such as  pursed lip breathing or other method taught during program session;Verbalizes importance of monitoring SPO2 with pulse oximeter and return demonstration;Maintenance of O2 saturations>88%      Oxygen Re-Evaluation:   Oxygen Discharge (Final Oxygen Re-Evaluation):   Initial Exercise Prescription:     Initial Exercise Prescription - 04/20/17 1500      Date of Initial Exercise RX and Referring Provider   Date 04/20/17   Referring Provider [name] MD     Treadmill   MPH 2.1   Grade 0   Minutes 15   METs 2.61     NuStep   Level 2   SPM 80   Minutes 15   METs 2.6     REL-XR   Level 1   Speed 50   Minutes 15   METs 2.6     Prescription  Details   Frequency (times per week) 3   Duration Progress to 45 minutes of aerobic exercise without signs/symptoms of physical distress     Intensity   THRR 40-80% of Max Heartrate 103-132   Ratings of Perceived Exertion 11-13   Perceived Dyspnea 0-4     Progression   Progression Continue to progress workloads to maintain intensity without signs/symptoms of physical distress.     Resistance Training   Training Prescription Yes   Weight 3 lbs   Reps 10-15      Perform Capillary Blood Glucose checks as needed.  Exercise Prescription Changes:     Exercise Prescription Changes    Row Name 04/20/17 1500             Response to Exercise   Blood Pressure (Admit) 122/70       Blood Pressure (Exercise) 126/60       Blood Pressure (Exit) 122/64       Heart Rate (Admit) 74 bpm       Heart Rate (Exercise) 106 bpm       Heart Rate (Exit) 76 bpm       Oxygen Saturation (Admit) 98 %       Oxygen Saturation (Exercise) 93 %       Oxygen Saturation (Exit) 100 %       Rating of Perceived Exertion (Exercise) 11       Perceived Dyspnea (Exercise) 1.5       Symptoms none       Comments walk test results          Exercise Comments:   Exercise Goals and Review:     Exercise Goals    Row Name 04/20/17 1558              Exercise Goals   Increase Physical Activity Yes       Intervention Provide advice, education, support and counseling about physical activity/exercise needs.;Develop an individualized exercise prescription for aerobic and resistive training based on initial evaluation findings, risk stratification, comorbidities and participant's personal goals.       Expected Outcomes Achievement of increased cardiorespiratory fitness and enhanced flexibility, muscular endurance and strength shown through measurements of functional capacity and personal statement of participant.       Increase Strength and Stamina Yes       Intervention Provide advice, education, support and counseling about physical activity/exercise needs.;Develop an individualized exercise prescription for aerobic and resistive training based on initial evaluation findings, risk stratification, comorbidities and participant's personal goals.       Expected Outcomes Achievement of increased cardiorespiratory fitness and enhanced flexibility, muscular endurance and strength shown through measurements of functional capacity and personal statement of participant.       Able to understand and use rate of perceived exertion (RPE) scale Yes       Intervention Provide education and explanation on how to use RPE scale       Expected Outcomes Short Term: Able to use RPE daily in rehab to express subjective intensity level;[place] Term:  Able to use RPE to guide intensity level when exercising independently       Able to understand and use Dyspnea scale Yes       Intervention Provide education and explanation on how to use Dyspnea scale       Expected Outcomes Short Term: Able to use Dyspnea scale daily in rehab to express subjective sense of shortness of breath during exertion;[place] Term: Able to use Dyspnea scale  to guide intensity level when exercising independently       Knowledge and understanding of Target Heart Rate Range (THRR) Yes       Intervention  Provide education and explanation of THRR including how the numbers were predicted and where they are located for reference       Expected Outcomes Short Term: Able to state/look up THRR;[place] Term: Able to use THRR to govern intensity when exercising independently;Short Term: Able to use daily as guideline for intensity in rehab       Able to check pulse independently Yes       Intervention Provide education and demonstration on how to check pulse in carotid and radial arteries.;Review the importance of being able to check your own pulse for safety during independent exercise       Expected Outcomes Short Term: Able to explain why pulse checking is important during independent exercise;[place] Term: Able to check pulse independently and accurately       Understanding of Exercise Prescription Yes       Intervention Provide education, explanation, and written materials on patient's individual exercise prescription       Expected Outcomes Short Term: Able to explain program exercise prescription;[place] Term: Able to explain home exercise prescription to exercise independently          Exercise Goals Re-Evaluation :   Discharge Exercise Prescription (Final Exercise Prescription Changes):     Exercise Prescription Changes - 04/20/17 1500      Response to Exercise   Blood Pressure (Admit) 122/70   Blood Pressure (Exercise) 126/60   Blood Pressure (Exit) 122/64   Heart Rate (Admit) 74 bpm   Heart Rate (Exercise) 106 bpm   Heart Rate (Exit) 76 bpm   Oxygen Saturation (Admit) 98 %   Oxygen Saturation (Exercise) 93 %   Oxygen Saturation (Exit) 100 %   Rating of Perceived Exertion (Exercise) 11   Perceived Dyspnea (Exercise) 1.5   Symptoms none   Comments walk test results      Nutrition:  Target Goals: Understanding of nutrition guidelines, daily intake of sodium 1500mg , cholesterol 200mg , calories 30% from fat and 7% or less from saturated fats, daily to have 5 or more servings of fruits and  vegetables.  Biometrics:     Pre Biometrics - 04/20/17 1558      Pre Biometrics   Height 5' 3.5" (1.613 m)   Weight 153 lb 11.2 oz (69.7 kg)   Waist Circumference 34 inches   Hip Circumference 39.75 inches   Waist to Hip Ratio 0.86 %   BMI (Calculated) 26.8       Nutrition Therapy Plan and Nutrition Goals:     Nutrition Therapy & Goals - 04/20/17 1448      Personal Nutrition Goals   Nutrition Goal Eat healthier and lose weight.   Comments She would like to see the dietician     Intervention Plan   Intervention Prescribe, educate and counsel regarding individualized specific dietary modifications aiming towards targeted core components such as weight, hypertension, lipid management, diabetes, heart failure and other comorbidities.;Nutrition handout(s) given to patient.   Expected Outcomes Short Term Goal: Understand basic principles of dietary content, such as calories, fat, sodium, cholesterol and nutrients.;Short Term Goal: A plan has been developed with personal nutrition goals set during dietitian appointment.;[place] Term Goal: Adherence to prescribed nutrition plan.      Nutrition Discharge: Rate Your Plate Scores:     Nutrition Assessments - 04/20/17 1600  MEDFICTS Scores   Pre Score 35      Nutrition Goals Re-Evaluation:   Nutrition Goals Discharge (Final Nutrition Goals Re-Evaluation):   Psychosocial: Target Goals: Acknowledge presence or absence of significant depression and/or stress, maximize coping skills, provide positive support system. Participant is able to verbalize types and ability to use techniques and skills needed for reducing stress and depression.   Initial Review & Psychosocial Screening:     Initial Psych Review & Screening - 04/20/17 1446      Initial Review   Current issues with Current Sleep Concerns;Current Stress Concerns   Source of Stress Concerns Family   Comments Husband has cancer, he was diagnosed five years ago and is  on IV chemo.      Family Dynamics   Good Support System? Yes   Comments Patient has one daughter and she lives in [place]. She has one grandson and two step grandkids.     Barriers   Psychosocial barriers to participate in program The patient should benefit from training in stress management and relaxation.     Screening Interventions   Interventions Yes;Encouraged to exercise;Program counselor consult;Provide feedback about the scores to participant;To provide support and resources with identified psychosocial needs   Expected Outcomes Short Term goal: Utilizing psychosocial counselor, staff and physician to assist with identification of specific Stressors or current issues interfering with healing process. Setting desired goal for each stressor or current issue identified.;[place] Term Goal: Stressors or current issues are controlled or eliminated.;Short Term goal: Identification and review with participant of any Quality of Life or Depression concerns found by scoring the questionnaire.;[place] Term goal: The participant improves quality of Life and PHQ9 Scores as seen by post scores and/or verbalization of changes      Quality of Life Scores:   PHQ-9: Recent Review Flowsheet Data    Depression screen [facility] 2/9 04/20/2017 07/07/2016 07/02/2015   Decreased Interest 0 0 0   Down, Depressed, Hopeless 0 0 0   PHQ - 2 Score 0 0 0   Altered sleeping 1 - -   Tired, decreased energy 1 - -   Change in appetite 1 - -   Feeling bad or failure about yourself  0 - -   Trouble concentrating 0 - -   Moving slowly or fidgety/restless 0 - -   Suicidal thoughts 0 - -   PHQ-9 Score 3 - -   Difficult doing work/chores Not difficult at all - -     Interpretation of Total Score  Total Score Depression Severity:  1-4 = Minimal depression, 5-9 = Mild depression, 10-14 = Moderate depression, 15-19 = Moderately severe depression, 20-27 = Severe depression   Psychosocial Evaluation and  Intervention:   Psychosocial Re-Evaluation:   Psychosocial Discharge (Final Psychosocial Re-Evaluation):   Education: Education Goals: Education classes will be provided on a weekly basis, covering required topics. Participant will state understanding/return demonstration of topics presented.  Learning Barriers/Preferences:     Learning Barriers/Preferences - 04/20/17 1459      Learning Barriers/Preferences   Learning Barriers Sight  wears glasses   Learning Preferences None      Education Topics: Initial Evaluation Education: - Verbal, written and demonstration of respiratory meds, RPE/PD scales, oximetry and breathing techniques. Instruction on use of nebulizers and MDIs: cleaning and proper use, rinsing mouth with steroid doses and importance of monitoring MDI activations.   Pulmonary Rehab from 04/20/2017 in [facility] Cardiac and Pulmonary Rehab  Date  04/20/17  Educator  [facility]  Instruction Review Code  1- Verbalizes Understanding      General Nutrition Guidelines/Fats and Fiber: -Group instruction provided by verbal, written material, models and posters to present the general guidelines for heart healthy nutrition. Gives an explanation and review of dietary fats and fiber.   Controlling Sodium/Reading Food Labels: -Group verbal and written material supporting the discussion of sodium use in heart healthy nutrition. Review and explanation with models, verbal and written materials for utilization of the food label.   Exercise Physiology & Risk Factors: - Group verbal and written instruction with models to review the exercise physiology of the cardiovascular system and associated critical values. Details cardiovascular disease risk factors and the goals associated with each risk factor.   Aerobic Exercise & Resistance Training: - Gives group verbal and written discussion on the health impact of inactivity. On the components of aerobic and resistive training programs and the  benefits of this training and how to safely progress through these programs.   Flexibility, Balance, General Exercise Guidelines: - Provides group verbal and written instruction on the benefits of flexibility and balance training programs. Provides general exercise guidelines with specific guidelines to those with heart or lung disease. Demonstration and skill practice provided.   Stress Management: - Provides group verbal and written instruction about the health risks of elevated stress, cause of high stress, and healthy ways to reduce stress.   Depression: - Provides group verbal and written instruction on the correlation between heart/lung disease and depressed mood, treatment options, and the stigmas associated with seeking treatment.   Exercise & Equipment Safety: - Individual verbal instruction and demonstration of equipment use and safety with use of the equipment.   Pulmonary Rehab from 04/20/2017 in [facility] Cardiac and Pulmonary Rehab  Date  04/20/17  Educator  [facility]  Instruction Review Code  1- Verbalizes Understanding      Infection Prevention: - Provides verbal and written material to individual with discussion of infection control including proper hand washing and proper equipment cleaning during exercise session.   Pulmonary Rehab from 04/20/2017 in [facility] Cardiac and Pulmonary Rehab  Date  04/20/17  Educator  [facility]  Instruction Review Code  1- Verbalizes Understanding      Falls Prevention: - Provides verbal and written material to individual with discussion of falls prevention and safety.   Pulmonary Rehab from 04/20/2017 in [facility] Cardiac and Pulmonary Rehab  Date  04/20/17  Educator  [facility]  Instruction Review Code  1- Verbalizes Understanding      Diabetes: - Individual verbal and written instruction to review signs/symptoms of diabetes, desired ranges of glucose level fasting, after meals and with exercise. Advice that pre and post exercise glucose checks will be done  for 3 sessions at entry of program.   Chronic Lung Diseases: - Group verbal and written instruction to review new updates, new respiratory medications, new advancements in procedures and treatments. Provide informative websites and "800" numbers of self-education.   Lung Procedures: - Group verbal and written instruction to describe testing methods done to diagnose lung disease. Review the outcome of test results. Describe the treatment choices: Pulmonary Function Tests, ABGs and oximetry.   Energy Conservation: - Provide group verbal and written instruction for methods to conserve energy, plan and organize activities. Instruct on pacing techniques, use of adaptive equipment and posture/positioning to relieve shortness of breath.   Triggers: - Group verbal and written instruction to review types of environmental controls: home humidity, furnaces, filters, dust mite/pet prevention, HEPA vacuums. To discuss weather  changes, air quality and the benefits of nasal washing.   Exacerbations: - Group verbal and written instruction to provide: warning signs, infection symptoms, calling MD promptly, preventive modes, and value of vaccinations. Review: effective airway clearance, coughing and/or vibration techniques. Create an Sports administrator.   Oxygen: - Individual and group verbal and written instruction on oxygen therapy. Includes supplement oxygen, available portable oxygen systems, continuous and intermittent flow rates, oxygen safety, concentrators, and Medicare reimbursement for oxygen.   Pulmonary Rehab from 04/20/2017 in [facility] Cardiac and Pulmonary Rehab  Date  04/20/17  Educator  [facility]  Instruction Review Code  1- Verbalizes Understanding      Respiratory Medications: - Group verbal and written instruction to review medications for lung disease. Drug class, frequency, complications, importance of spacers, rinsing mouth after steroid MDI's, and proper cleaning methods for nebulizers.    Pulmonary Rehab from 04/20/2017 in [facility] Cardiac and Pulmonary Rehab  Date  04/20/17  Educator  [facility]  Instruction Review Code  1- Verbalizes Understanding      AED/CPR: - Group verbal and written instruction with the use of models to demonstrate the basic use of the AED with the basic ABC's of resuscitation.   Breathing Retraining: - Provides individuals verbal and written instruction on purpose, frequency, and proper technique of diaphragmatic breathing and pursed-lipped breathing. Applies individual practice skills.   Pulmonary Rehab from 04/20/2017 in [facility] Cardiac and Pulmonary Rehab  Date  04/20/17  Educator  [facility]  Instruction Review Code  1- Verbalizes Understanding      Anatomy and Physiology of the Lungs: - Group verbal and written instruction with the use of models to provide basic lung anatomy and physiology related to function, structure and complications of lung disease.   Anatomy & Physiology of the Heart: - Group verbal and written instruction and models provide basic cardiac anatomy and physiology, with the coronary electrical and arterial systems. Review of: AMI, Angina, Valve disease, Heart Failure, Cardiac Arrhythmia, Pacemakers, and the ICD.   Heart Failure: - Group verbal and written instruction on the basics of heart failure: signs/symptoms, treatments, explanation of ejection fraction, enlarged heart and cardiomyopathy.   Sleep Apnea: - Individual verbal and written instruction to review Obstructive Sleep Apnea. Review of risk factors, methods for diagnosing and types of masks and machines for OSA.   Anxiety: - Provides group, verbal and written instruction on the correlation between heart/lung disease and anxiety, treatment options, and management of anxiety.   Relaxation: - Provides group, verbal and written instruction about the benefits of relaxation for patients with heart/lung disease. Also provides patients with examples of relaxation  techniques.   Cardiac Medications: - Group verbal and written instruction to review commonly prescribed medications for heart disease. Reviews the medication, class of the drug, and side effects.   Know Your Numbers: -Group verbal and written instruction about important numbers in your health.  Review of Cholesterol, Blood Pressure, Diabetes, and BMI and the role they play in your overall health.   Other: -Provides group and verbal instruction on various topics (see comments)    Knowledge Questionnaire Score:     Knowledge Questionnaire Score - 04/20/17 1459      Knowledge Questionnaire Score   Pre Score 9/10  Reviewed with patient       Core Components/Risk Factors/Patient Goals at Admission:     Personal Goals and Risk Factors at Admission - 04/20/17 1505      Core Components/Risk Factors/Patient Goals on Admission    Weight Management Yes;Weight  Loss   Intervention Weight Management: Develop a combined nutrition and exercise program designed to reach desired caloric intake, while maintaining appropriate intake of nutrient and fiber, sodium and fats, and appropriate energy expenditure required for the weight goal.;Weight Management: Provide education and appropriate resources to help participant work on and attain dietary goals.;Weight Management/Obesity: Establish reasonable short term and [place]a term weight goals.   Admit Weight 153 lb 11.2 oz (69.7 kg)   Goal Weight: Short Term 148 lb (67.1 kg)   Goal Weight: Nolt Term 135 lb (61.2 kg)   Expected Outcomes Delfavero Term: Adherence to nutrition and physical activity/exercise program aimed toward attainment of established weight goal;Weight Maintenance: Understanding of the daily nutrition guidelines, which includes 25-35% calories from fat, 7% or less cal from saturated fats, less than 200mg  cholesterol, less than 1.5gm of sodium, & 5 or more servings of fruits and vegetables daily;Short Term: Continue to assess and modify  interventions until short term weight is achieved;Weight Loss: Understanding of general recommendations for a balanced deficit meal plan, which promotes 1-2 lb weight loss per week and includes a negative energy balance of [id number] kcal/d;Understanding recommendations for meals to include 15-35% energy as protein, 25-35% energy from fat, 35-60% energy from carbohydrates, less than 200mg  of dietary cholesterol, 20-35 gm of total fiber daily;Understanding of distribution of calorie intake throughout the day with the consumption of 4-5 meals/snacks   Improve shortness of breath with ADL's Yes   Intervention Provide education, individualized exercise plan and daily activity instruction to help decrease symptoms of SOB with activities of daily living.   Expected Outcomes Short Term: Achieves a reduction of symptoms when performing activities of daily living.   Heart Failure Yes   Intervention Provide a combined exercise and nutrition program that is supplemented with education, support and counseling about heart failure. Directed toward relieving symptoms such as shortness of breath, decreased exercise tolerance, and extremity edema.   Expected Outcomes Improve functional capacity of life;Short term: Attendance in program 2-3 days a week with increased exercise capacity. Reported lower sodium intake. Reported increased fruit and vegetable intake. Reports medication compliance.;Short term: Daily weights obtained and reported for increase. Utilizing diuretic protocols set by physician.;Hillis term: Adoption of self-care skills and reduction of barriers for early signs and symptoms recognition and intervention leading to self-care maintenance.   Hypertension Yes   Intervention Provide education on lifestyle modifcations including regular physical activity/exercise, weight management, moderate sodium restriction and increased consumption of fresh fruit, vegetables, and low fat dairy, alcohol moderation, and smoking  cessation.;Monitor prescription use compliance.   Expected Outcomes Adelsberger Term: Maintenance of blood pressure at goal levels.;Short Term: Continued assessment and intervention until BP is < 140/74mm HG in hypertensive participants. < 130/68mm HG in hypertensive participants with diabetes, heart failure or chronic kidney disease.   Stress Yes   Intervention Offer individual and/or small group education and counseling on adjustment to heart disease, stress management and health-related lifestyle change. Teach and support self-help strategies.;Refer participants experiencing significant psychosocial distress to appropriate mental health specialists for further evaluation and treatment. When possible, include family members and significant others in education/counseling sessions.   Expected Outcomes Short Term: Participant demonstrates changes in health-related behavior, relaxation and other stress management skills, ability to obtain effective social support, and compliance with psychotropic medications if prescribed.;Ferner Term: Emotional wellbeing is indicated by absence of clinically significant psychosocial distress or social isolation.      Core Components/Risk Factors/Patient Goals Review:    Core Components/Risk Factors/Patient Goals at  Discharge (Final Review):    ITP Comments:     ITP Comments    Row Name 04/20/17 1427           ITP Comments Medical Evaluation completed. Chart sent for review and changes to Dr. Emily Filbert Director of [place]. Visit Diagnosis can be found in [facility] encounter 04/20/17          Comments: Initial ITP

## 2017-04-23 ENCOUNTER — Other Ambulatory Visit: Payer: Self-pay | Admitting: *Deleted

## 2017-04-23 ENCOUNTER — Ambulatory Visit: Payer: Self-pay | Admitting: Cardiovascular Disease

## 2017-04-23 ENCOUNTER — Ambulatory Visit (INDEPENDENT_AMBULATORY_CARE_PROVIDER_SITE_OTHER): Payer: Medicare Other | Admitting: Cardiovascular Disease

## 2017-04-23 ENCOUNTER — Encounter: Payer: Self-pay | Admitting: Cardiovascular Disease

## 2017-04-23 VITALS — BP 108/68 | HR 75 | Ht 63.5 in | Wt 154.0 lb

## 2017-04-23 DIAGNOSIS — I5022 Chronic systolic (congestive) heart failure: Secondary | ICD-10-CM | POA: Diagnosis not present

## 2017-04-23 DIAGNOSIS — I10 Essential (primary) hypertension: Secondary | ICD-10-CM

## 2017-04-23 DIAGNOSIS — I255 Ischemic cardiomyopathy: Secondary | ICD-10-CM | POA: Diagnosis not present

## 2017-04-23 DIAGNOSIS — I482 Chronic atrial fibrillation, unspecified: Secondary | ICD-10-CM

## 2017-04-23 DIAGNOSIS — Z953 Presence of xenogenic heart valve: Secondary | ICD-10-CM | POA: Diagnosis not present

## 2017-04-23 NOTE — Telephone Encounter (Signed)
Please review for refill, thanks ! 

## 2017-04-23 NOTE — Patient Instructions (Signed)
Medication Instructions: Continue same medications.   Labwork: None.   Procedures/Testing: None.   Follow-Up: 6 months with Dr. Yair Dusza.   Any Additional Special Instructions Will Be Listed Below (If Applicable).     If you need a refill on your cardiac medications before your next appointment, please call your pharmacy.   

## 2017-04-23 NOTE — Progress Notes (Signed)
Cardiology Office Note   Date:  04/23/2017   ID:  Elizabeth Hall, DOB Jun 26, 1940, MRN [id number]  PCP:  Ria Bush, MD  Cardiologist:   Kathlyn Sacramento, MD   Chief Complaint  Patient presents with  . other    3 month follow up. Meds reviewed by the pt. verbally. Pt. will start Pulmonary Rehab on Monday for 36 sessions. "doing well."       History of Present Illness: Elizabeth Hall is a 76 y.o. female who presents for  a follow-up visit. 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 Congestive heart failure was diagnosed in 2007.  She has known history of atrial fibrillation. She underwent AV nodal ablation and pacemaker placement in November 2013. 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 Her ejection fraction deteriorated and she underwent explantation of the previous pacemaker and implantation of biventricular ICD in March 2015. She had CVA in the past when her warfarin was held for a procedure. Since then, she has been bridged with low molecular weight heparin.  Echocardiogram in 12/2015 showed an ejection fraction of 34-74%, grade 2 diastolic dysfunction, replaced mitral valve with moderate regurgitation and a mean gradient of 5 mmHg. Left atrium was severely dilated. Pulmonary pressure could not be estimated. ABI was normal in 02/2016.   She had worsening symptoms of heart failure a few months ago. I proceeded with a right and left cardiac catheterization in July which showed mild nonobstructive coronary artery disease.  Ejection fraction was 40%.  Right heart catheterization showed normal LVEDP, normal cardiac output and no significant pulmonary hypertension.  There was no evidence of left-to-right intra-atrial shunt.  There was mildly increased gradient across the mitral valve  prosthesis with a mean gradient of 8 mmHg with a valve area of 1.48.  She has been doing reasonably well overall and reports stable dyspnea.  She was seen by pulmonary is going to start pulmonary rehab.  She occasionally has to use an extra dose of Bumex for fluid overload.   Past Medical History:  Diagnosis Date  . Bleeding of eye   . Chronic systolic heart failure ([place])    a. 11/2016 Echo: EF 35-40%, mild MR w/ nl fxning bioprosthesis, mod dil LA.  . CKD (chronic kidney disease) stage 3, GFR 30-59 ml/min (HCC)    baseline Cr 1.5-2; prior saw nephrologist thought hypertensive nephropathy/renovascular disease  . CVA (cerebral infarction)    a. 2014 occipital lobe - some vision loss when coumadin held and not bridged  . Dyslipidemia   . Glaucoma   . H. pylori infection    treated  . H/O mitral valve replacement    a. 05/2012 s/p 8mm Carpentier Edwards pericardial tissue valve.  [name] Hx of rheumatic fever   . Hypertension   . Melanoma ([place]) 2006   L shoulder  . NICM (nonischemic cardiomyopathy) ([place])    a. 08/2013 s/p Biotronik Ilesto 7 HF1 BiV ICD (ser# [id number]);  b. 11/2016 Echo: EF 35-40%.  . Non-obstructive CAD    a. s/p MI 2001;  b. 12/2016 Cath: LM 20ost, OM2 60, RCA 30p/d, EF 35-45%. Nl R heart filling pressures.  . Permanent atrial fibrillation ([place])    a. 04/2012 s/p AVN & PPM placement (later upgraded to BiV ICD).  [name] Phlebitis   . Thromboembolism of upper extremity artery (  [place])    a. 06/2012 h/o acute ischemia due to thromboembolism radial and ulnar arteries when coumadin held s/p atherectomy - needs bridging if off coumadin  . Tricuspid regurgitation    a. 05/2012 s/p TV annuloplasty @ time of MVR.    Past Surgical History:  Procedure Laterality Date  . ABDOMINAL HYSTERECTOMY    . ABLATION  2013  . ATHERECTOMY  06/2012   left arm after coumadin held without bridging  . BREAST BIOPSY    . CARDIOVASCULAR STRESS TEST  03/2016   WNL  . CATARACT EXTRACTION Bilateral 2007,  2010  . COLONOSCOPY  03/2014   diverticulosis, int hem, rpt 5 yrs for h/o polyps ([place])  . defibrillator     MEDTRONIC 5086 MRI 52 CM LEAD, SERIAL # [id number]  . DEXA  07/2016   WNL  . DG ABDOMEN COMPLETE ([place] HX)  02/2013   HH, mod severe erosive gastritis, duodenitis  . GALLBLADDER SURGERY  2006  . INSERT / REPLACE / REMOVE PACEMAKER  08/2013  . MITRAL VALVE REPLACEMENT  05/2012  . RIGHT/LEFT HEART CATH AND CORONARY ANGIOGRAPHY N/A 12/29/2016   Procedure: Right/Left Heart Cath and Coronary Angiography;  Surgeon: [name], MD;  Location: [facility];  Service: Cardiovascular;  Laterality: N/A;  . TOTAL ABDOMINAL HYSTERECTOMY W/ BILATERAL SALPINGOOPHORECTOMY  1991   irregular bleeding  . TRICUSPID VALVE REPLACEMENT  05/2012  . US ECHOCARDIOGRAPHY  02/2014   EF 40%, LA marked dilation, gen LV hypokinesis, mitral prosthetic valve     Current Outpatient Prescriptions  Medication Sig Dispense Refill  . acetaminophen (TYLENOL) 500 MG tablet Take 500-1,000 mg by mouth every 6 (six) hours as needed (for headaches/pain.).    [name] aspirin EC 81 MG tablet Take 81 mg by mouth daily.    . bumetanide (BUMEX) 2 MG tablet TAKE 1 TABLET(2 MG) BY MOUTH DAILY 90 tablet 3  . carvedilol (COREG) 12.5 MG tablet Take 1 tablet (12.5 mg total) by mouth 2 (two) times daily with a meal. 180 tablet 3  . estradiol (ESTRACE) 1 MG tablet Take 0.5 mg by mouth 2 (two) times a week.     . latanoprost (XALATAN) 0.005 % ophthalmic solution Place 1 drop into both eyes at bedtime.  99  . Magnesium 250 MG TABS Take 250 mg by mouth daily.    . meclizine (ANTIVERT) 25 MG tablet Take 1 tablet (25 mg total) by mouth 2 (two) times daily as needed for dizziness. 180 tablet 0  . Polyethyl Glycol-Propyl Glycol (LUBRICANT EYE DROPS) 0.4-0.3 % SOLN Place 1 drop into both eyes 3 (three) times daily as needed (for dry eyes.).    [name] sacubitril-valsartan (ENTRESTO) 24-26 MG Take 1 tablet by mouth 2 (two) times daily. 180  tablet 3  . spironolactone (ALDACTONE) 25 MG tablet TAKE 1/2 TABLET(12.5 MG) BY MOUTH DAILY 30 tablet 2  . vitamin E 400 UNIT capsule Take 400 Units by mouth daily.    [name] warfarin (COUMADIN) 3 MG tablet Take as directed by [facility] (Patient taking differently: Take 1.5-3 mg by mouth daily. TAKE 0.5 tablet (1.5 MG) ON Monday & Friday mornings, then take 1 tablet (3 MG) daily in the morning on all other days.) 90 tablet 1   No current facility-administered medications for this visit.     Allergies:   Amiodarone; Phytonadione [vitamin k1]; Biaxin [clarithromycin]; Ciprofloxacin; Flagyl [metronidazole]; and Lisinopril    Social History:  The patient  reports that she quit smoking about  27 years ago. Her smoking use included Cigarettes. She has a 10.00 pack-year smoking history. She has never used smokeless tobacco. She reports that she drinks alcohol. She reports that she does not use drugs.   Family History:  The patient's family history includes CAD (age of onset: 31) in her father; Cancer in her cousin, cousin, and maternal aunt; Diabetes in her cousin; Heart failure in her brother and sister; Hypertension in her brother; Stroke in her brother and sister.    ROS:  Please see the history of present illness.   Otherwise, review of systems are positive for none.   All other systems are reviewed and negative.    PHYSICAL EXAM: VS:  BP 108/68 (BP Location: Right Arm, Patient Position: Sitting, Cuff Size: Normal)   Pulse 75   Ht 5' 3.5" (1.613 m)   Wt 154 lb (69.9 kg)   LMP  (LMP Unknown) Comment: menopause age 14  BMI 26.85 kg/m  , BMI Body mass index is 26.85 kg/m. GEN: Well nourished, well developed, in no acute distress  HEENT: normal  Neck: no JVD, carotid bruits, or masses Cardiac: RRR; no murmurs, rubs, or gallops,no edema  Respiratory:  clear to auscultation bilaterally, normal work of breathing GI: soft, nontender, nondistended, + BS MS: no deformity or atrophy  Skin: warm  and dry, no rash Neuro:  Strength and sensation are intact Psych: euthymic mood, full affect   EKG:  EKG ordered today. EKG showed biventricular pacing with underlying atrial fibrillation.   Recent Labs: 12/19/2016: BUN 23; Creatinine, Ser 1.35; Hemoglobin 13.3; Platelets 235; Potassium 4.7; Sodium 143    Lipid Panel    Component Value Date/Time   CHOL 160 06/27/2015 0841   CHOL 192 06/12/2014   TRIG 62.0 06/27/2015 0841   TRIG 70 06/12/2014   HDL 51.60 06/27/2015 0841   CHOLHDL 3 06/27/2015 0841   VLDL 12.4 06/27/2015 0841   LDLCALC 96 06/27/2015 0841   LDLCALC 104 06/12/2014      Wt Readings from Last 3 Encounters:  04/23/17 154 lb (69.9 kg)  04/20/17 153 lb 11.2 oz (69.7 kg)  03/16/17 150 lb (68 kg)         ASSESSMENT AND PLAN:  1.  Chronic systolic heart failure:  Most recent ejection fraction was 35-40%.    She appears to be euvolemic and currently on optimal medical therapy.  Continue same medications.  She will require basic metabolic profile next visit.  2. Chronic atrial fibrillation: Continue [place]-term anticoagulation with warfarin.   3. Essential hypertension: Blood pressure is well controlled on current medications.  4. Status post mitral valve replacement with bioprosthetic valve: Acceptable function on most recent cardiac catheterization.  Mildly increased gradient as expected.   Disposition:   FU with me in 6 months  Signed,  Kathlyn Sacramento, MD  04/23/2017 9:32 AM    [place]

## 2017-04-23 NOTE — Telephone Encounter (Signed)
Pharmacy requests a ninety day rx. 

## 2017-04-24 MED ORDER — WARFARIN SODIUM 3 MG PO TABS: ORAL_TABLET | ORAL | 1 refills | Status: DC

## 2017-04-27 ENCOUNTER — Encounter: Payer: Medicare Other | Attending: Pulmonary Disease

## 2017-04-27 DIAGNOSIS — E785 Hyperlipidemia, unspecified: Secondary | ICD-10-CM | POA: Insufficient documentation

## 2017-04-27 DIAGNOSIS — I428 Other cardiomyopathies: Secondary | ICD-10-CM | POA: Insufficient documentation

## 2017-04-27 DIAGNOSIS — I130 Hypertensive heart and chronic kidney disease with heart failure and stage 1 through stage 4 chronic kidney disease, or unspecified chronic kidney disease: Secondary | ICD-10-CM | POA: Diagnosis not present

## 2017-04-27 DIAGNOSIS — Z8673 Personal history of transient ischemic attack (TIA), and cerebral infarction without residual deficits: Secondary | ICD-10-CM | POA: Insufficient documentation

## 2017-04-27 DIAGNOSIS — Z7951 Long term (current) use of inhaled steroids: Secondary | ICD-10-CM | POA: Diagnosis not present

## 2017-04-27 DIAGNOSIS — Z7901 Long term (current) use of anticoagulants: Secondary | ICD-10-CM | POA: Diagnosis not present

## 2017-04-27 DIAGNOSIS — Z79899 Other long term (current) drug therapy: Secondary | ICD-10-CM | POA: Insufficient documentation

## 2017-04-27 DIAGNOSIS — Z8672 Personal history of thrombophlebitis: Secondary | ICD-10-CM | POA: Insufficient documentation

## 2017-04-27 DIAGNOSIS — Z95810 Presence of automatic (implantable) cardiac defibrillator: Secondary | ICD-10-CM | POA: Insufficient documentation

## 2017-04-27 DIAGNOSIS — Z85820 Personal history of malignant melanoma of skin: Secondary | ICD-10-CM | POA: Insufficient documentation

## 2017-04-27 DIAGNOSIS — Z87891 Personal history of nicotine dependence: Secondary | ICD-10-CM | POA: Diagnosis not present

## 2017-04-27 DIAGNOSIS — I5022 Chronic systolic (congestive) heart failure: Secondary | ICD-10-CM | POA: Insufficient documentation

## 2017-04-27 DIAGNOSIS — I509 Heart failure, unspecified: Secondary | ICD-10-CM

## 2017-04-27 DIAGNOSIS — N183 Chronic kidney disease, stage 3 (moderate): Secondary | ICD-10-CM | POA: Insufficient documentation

## 2017-04-27 DIAGNOSIS — Z86718 Personal history of other venous thrombosis and embolism: Secondary | ICD-10-CM | POA: Diagnosis not present

## 2017-04-27 DIAGNOSIS — Z7982 Long term (current) use of aspirin: Secondary | ICD-10-CM | POA: Insufficient documentation

## 2017-04-27 DIAGNOSIS — Z953 Presence of xenogenic heart valve: Secondary | ICD-10-CM | POA: Diagnosis not present

## 2017-04-27 DIAGNOSIS — H409 Unspecified glaucoma: Secondary | ICD-10-CM | POA: Diagnosis not present

## 2017-04-27 DIAGNOSIS — I482 Chronic atrial fibrillation: Secondary | ICD-10-CM | POA: Insufficient documentation

## 2017-04-27 DIAGNOSIS — I2510 Atherosclerotic heart disease of native coronary artery without angina pectoris: Secondary | ICD-10-CM | POA: Insufficient documentation

## 2017-04-27 NOTE — Progress Notes (Signed)
Daily Session Note  Patient Details  Name: Elizabeth Hall MRN: [id number] Date of Birth: 1941-06-17 Referring Provider:     Pulmonary Rehab from 04/20/2017 in [facility] Cardiac and Pulmonary Rehab  Referring Provider  Elizabeth Border MD      Encounter Date: 04/27/2017  Check In: Session Check In - 04/27/17 1026      Check-In   Location  [facility]-Cardiac & Pulmonary Rehab    Staff Present  Elizabeth Hall, BS, ACSM CEP, Exercise Physiologist;Elizabeth Hall, BA, ACSM CEP, Exercise Physiologist;Elizabeth Hall    Supervising physician immediately available to respond to emergencies  [name] immediately available ER MD    Physician(s)  Dr. Alfred Hall and Elizabeth Hall    Medication changes reported      No    Fall or balance concerns reported     No    Warm-up and Cool-down  Performed as group-led instruction    Resistance Training Performed  Yes    VAD Patient?  No      Pain Assessment   Currently in Pain?  No/denies          Social History   Tobacco Use  Smoking Status Former Smoker  . Packs/day: 0.50  . Years: 20.00  . Pack years: 10.00  . Types: Cigarettes  . Last attempt to quit: 02/04/1990  . Years since quitting: 27.2  Smokeless Tobacco Never Used    Goals Met:  Exercise tolerated well Queuing for purse lip breathing No report of cardiac concerns or symptoms Strength training completed today  Goals Unmet:  Not Applicable  Comments: First full day of exercise!  Patient was oriented to gym and equipment including functions, settings, policies, and procedures.  Patient's individual exercise prescription and treatment plan were reviewed.  All starting workloads were established based on the results of the 6 minute walk test done at initial orientation visit.  The plan for exercise progression was also introduced and progression will be customized based on patient's performance and goals. Spent 10 minutes with patient reviewing PLB.   Dr. Emily Hall is Medical  Director for [name] and [name].

## 2017-04-29 ENCOUNTER — Other Ambulatory Visit: Payer: Self-pay | Admitting: *Deleted

## 2017-04-29 DIAGNOSIS — Z7982 Long term (current) use of aspirin: Secondary | ICD-10-CM | POA: Diagnosis not present

## 2017-04-29 DIAGNOSIS — Z7951 Long term (current) use of inhaled steroids: Secondary | ICD-10-CM | POA: Diagnosis not present

## 2017-04-29 DIAGNOSIS — Z79899 Other long term (current) drug therapy: Secondary | ICD-10-CM | POA: Diagnosis not present

## 2017-04-29 DIAGNOSIS — I130 Hypertensive heart and chronic kidney disease with heart failure and stage 1 through stage 4 chronic kidney disease, or unspecified chronic kidney disease: Secondary | ICD-10-CM | POA: Diagnosis not present

## 2017-04-29 DIAGNOSIS — I5022 Chronic systolic (congestive) heart failure: Secondary | ICD-10-CM | POA: Diagnosis not present

## 2017-04-29 DIAGNOSIS — N183 Chronic kidney disease, stage 3 (moderate): Secondary | ICD-10-CM | POA: Diagnosis not present

## 2017-04-29 DIAGNOSIS — I509 Heart failure, unspecified: Secondary | ICD-10-CM

## 2017-04-29 MED ORDER — SACUBITRIL-VALSARTAN 24-26 MG PO TABS: 1.0000 | ORAL_TABLET | Freq: Two times a day (BID) | ORAL | 3 refills | Status: DC

## 2017-04-29 NOTE — Progress Notes (Signed)
Daily Session Note  Patient Details  Name: Elizabeth Hall MRN: [id number] Date of Birth: August 03, 1940 Referring Provider:     Pulmonary Rehab from 04/20/2017 in [facility] Cardiac and Pulmonary Rehab  Referring Provider  [name] MD      Encounter Date: 04/29/2017  Check In: Session Check In - 04/29/17 1008      Check-In   Location  [facility]-Cardiac & Pulmonary Rehab    Staff Present  Nada Maclachlan, BA, ACSM CEP, Exercise Physiologist;Carmello Cabiness Darrin Nipper, [place], ACSM RCEP, Exercise Physiologist    Supervising physician immediately available to respond to emergencies  [name] immediately available ER MD    Physician(s)  Dr. Jimmye Norman and [facility]    Medication changes reported      No    Fall or balance concerns reported     No    Warm-up and Cool-down  Performed as group-led instruction    Resistance Training Performed  Yes    VAD Patient?  No      Pain Assessment   Currently in Pain?  No/denies          Social History   Tobacco Use  Smoking Status Former Smoker  . Packs/day: 0.50  . Years: 20.00  . Pack years: 10.00  . Types: Cigarettes  . Last attempt to quit: 02/04/1990  . Years since quitting: 27.2  Smokeless Tobacco Never Used    Goals Met:  Exercise tolerated well No report of cardiac concerns or symptoms Strength training completed today  Goals Unmet:  Not Applicable  Comments: Pt able to follow exercise prescription today without complaint.  Will continue to monitor for progression.   Dr. Emily Filbert is Medical Director for [place] and [name].

## 2017-05-01 ENCOUNTER — Other Ambulatory Visit: Payer: Self-pay

## 2017-05-01 DIAGNOSIS — Z7982 Long term (current) use of aspirin: Secondary | ICD-10-CM | POA: Diagnosis not present

## 2017-05-01 DIAGNOSIS — Z79899 Other long term (current) drug therapy: Secondary | ICD-10-CM | POA: Diagnosis not present

## 2017-05-01 DIAGNOSIS — N183 Chronic kidney disease, stage 3 (moderate): Secondary | ICD-10-CM | POA: Diagnosis not present

## 2017-05-01 DIAGNOSIS — I509 Heart failure, unspecified: Secondary | ICD-10-CM

## 2017-05-01 DIAGNOSIS — I130 Hypertensive heart and chronic kidney disease with heart failure and stage 1 through stage 4 chronic kidney disease, or unspecified chronic kidney disease: Secondary | ICD-10-CM | POA: Diagnosis not present

## 2017-05-01 DIAGNOSIS — I5022 Chronic systolic (congestive) heart failure: Secondary | ICD-10-CM | POA: Diagnosis not present

## 2017-05-01 DIAGNOSIS — Z7951 Long term (current) use of inhaled steroids: Secondary | ICD-10-CM | POA: Diagnosis not present

## 2017-05-01 MED ORDER — SPIRONOLACTONE 25 MG PO TABS: ORAL_TABLET | ORAL | 3 refills | Status: DC

## 2017-05-01 NOTE — Progress Notes (Signed)
Daily Session Note  Patient Details  Name: Elizabeth Hall MRN: [id number] Date of Birth: 1941-02-22 Referring Provider:     Pulmonary Rehab from 04/20/2017 in [facility] Cardiac and Pulmonary Rehab  Referring Provider  [name] MD      Encounter Date: 05/01/2017  Check In: Session Check In - 05/01/17 0959      Check-In   Location  [facility]-Cardiac & Pulmonary Rehab    Staff Present  Nada Maclachlan, BA, ACSM CEP, Exercise Physiologist;Mikhayla Phillis Tessie Fass RCP,RRT,BSRT;Carroll Enterkin, RN, BSN    Supervising physician immediately available to respond to emergencies  [name] immediately available ER MD    Physician(s)  Dr. Mable Paris and [facility]    Medication changes reported      No    Fall or balance concerns reported     No    Warm-up and Cool-down  Performed as group-led instruction    Resistance Training Performed  Yes    VAD Patient?  No      Pain Assessment   Currently in Pain?  No/denies          Social History   Tobacco Use  Smoking Status Former Smoker  . Packs/day: 0.50  . Years: 20.00  . Pack years: 10.00  . Types: Cigarettes  . Last attempt to quit: 02/04/1990  . Years since quitting: 27.2  Smokeless Tobacco Never Used    Goals Met:  Independence with exercise equipment Exercise tolerated well No report of cardiac concerns or symptoms Strength training completed today  Goals Unmet:  Not Applicable  Comments: Reviewed home exercise with pt today.  Pt plans to walk 2 extra days a week at home for exercise.  Reviewed THR, pulse, RPE, sign and symptoms, NTG use, and when to call 911 or MD.  Also discussed weather considerations and indoor options.  Pt voiced understanding.Pt able to follow exercise prescription today without complaint.  Will continue to monitor for progression.   Dr. Emily Filbert is Medical Director for [name] and [name].

## 2017-05-04 ENCOUNTER — Telehealth: Payer: Self-pay

## 2017-05-04 NOTE — Telephone Encounter (Signed)
Elizabeth Hall called to say she will be back in class Wednesday. Her husband is in the hospital and cannot attend class today.

## 2017-05-06 ENCOUNTER — Encounter: Payer: Medicare Other | Admitting: *Deleted

## 2017-05-06 DIAGNOSIS — I130 Hypertensive heart and chronic kidney disease with heart failure and stage 1 through stage 4 chronic kidney disease, or unspecified chronic kidney disease: Secondary | ICD-10-CM | POA: Diagnosis not present

## 2017-05-06 DIAGNOSIS — Z7982 Long term (current) use of aspirin: Secondary | ICD-10-CM | POA: Diagnosis not present

## 2017-05-06 DIAGNOSIS — Z79899 Other long term (current) drug therapy: Secondary | ICD-10-CM | POA: Diagnosis not present

## 2017-05-06 DIAGNOSIS — N183 Chronic kidney disease, stage 3 (moderate): Secondary | ICD-10-CM | POA: Diagnosis not present

## 2017-05-06 DIAGNOSIS — I5022 Chronic systolic (congestive) heart failure: Secondary | ICD-10-CM | POA: Diagnosis not present

## 2017-05-06 DIAGNOSIS — Z7951 Long term (current) use of inhaled steroids: Secondary | ICD-10-CM | POA: Diagnosis not present

## 2017-05-06 DIAGNOSIS — I509 Heart failure, unspecified: Secondary | ICD-10-CM

## 2017-05-06 NOTE — Progress Notes (Signed)
Daily Session Note  Patient Details  Name: Elizabeth Hall MRN: [id number] Date of Birth: [birth year] Referring Provider:     Pulmonary Rehab from 04/20/2017 in [facility] Cardiac and Pulmonary Rehab  Referring Provider  [name] MD      Encounter Date: 05/06/2017  Check In: Session Check In - 05/06/17 1024      Check-In   Location  [facility]-Cardiac & Pulmonary Rehab    Staff Present  Nada Maclachlan, BA, ACSM CEP, Exercise Physiologist;Arish Redner Darrin Nipper, [place], ACSM RCEP, Exercise Physiologist    Supervising physician immediately available to respond to emergencies  [name] immediately available ER MD    Physician(s)  Dr. Estill Dooms and [place]    Medication changes reported      No    Fall or balance concerns reported     No    Warm-up and Cool-down  Performed as group-led instruction    Resistance Training Performed  Yes    VAD Patient?  No      Pain Assessment   Currently in Pain?  No/denies          Social History   Tobacco Use  Smoking Status Former Smoker  . Packs/day: 0.50  . Years: 20.00  . Pack years: 10.00  . Types: Cigarettes  . Last attempt to quit: 02/04/1990  . Years since quitting: 27.2  Smokeless Tobacco Never Used    Goals Met:  Independence with exercise equipment Exercise tolerated well No report of cardiac concerns or symptoms Strength training completed today  Goals Unmet:  Not Applicable  Comments: Pt able to follow exercise prescription today without complaint.  Will continue to monitor for progression.   Dr. Emily Filbert is Medical Director for [place] and [name].

## 2017-05-11 ENCOUNTER — Ambulatory Visit (INDEPENDENT_AMBULATORY_CARE_PROVIDER_SITE_OTHER): Payer: Medicare Other

## 2017-05-11 DIAGNOSIS — I509 Heart failure, unspecified: Secondary | ICD-10-CM

## 2017-05-11 DIAGNOSIS — I4891 Unspecified atrial fibrillation: Secondary | ICD-10-CM | POA: Diagnosis not present

## 2017-05-11 DIAGNOSIS — I482 Chronic atrial fibrillation: Secondary | ICD-10-CM | POA: Diagnosis not present

## 2017-05-11 DIAGNOSIS — I4821 Permanent atrial fibrillation: Secondary | ICD-10-CM

## 2017-05-11 DIAGNOSIS — Z953 Presence of xenogenic heart valve: Secondary | ICD-10-CM

## 2017-05-11 DIAGNOSIS — Z5181 Encounter for therapeutic drug level monitoring: Secondary | ICD-10-CM

## 2017-05-11 LAB — POCT INR: INR: 2.3

## 2017-05-11 NOTE — Patient Instructions (Signed)
Please continue current dosage of 1 tablet daily except 1/2 tablet on Mondays, Wednesdays and Fridays.   Be consistent with green intake. Recheck in 4 weeks.

## 2017-05-11 NOTE — Progress Notes (Signed)
Pulmonary Individual Treatment Plan  Patient Details  Name: Elizabeth Hall MRN: [id number] Date of Birth: 10/25/1940 Referring Provider:     Pulmonary Rehab from 04/20/2017 in [facility] Cardiac and Pulmonary Rehab  Referring Provider  [name] MD      Initial Encounter Date:    Pulmonary Rehab from 04/20/2017 in [facility] Cardiac and Pulmonary Rehab  Date  04/20/17  Referring Provider  [name] MD      Visit Diagnosis: Chronic congestive heart failure, unspecified heart failure type [facility])  Patient's Home Medications on Admission:  Current Outpatient Medications:  .  acetaminophen (TYLENOL) 500 MG tablet, Take 500-1,000 mg by mouth every 6 (six) hours as needed (for headaches/pain.)., Disp: , Rfl:  .  aspirin EC 81 MG tablet, Take 81 mg by mouth daily., Disp: , Rfl:  .  bumetanide (BUMEX) 2 MG tablet, TAKE 1 TABLET(2 MG) BY MOUTH DAILY, Disp: 90 tablet, Rfl: 3 .  carvedilol (COREG) 12.5 MG tablet, Take 1 tablet (12.5 mg total) by mouth 2 (two) times daily with a meal., Disp: 180 tablet, Rfl: 3 .  estradiol (ESTRACE) 1 MG tablet, Take 0.5 mg by mouth 2 (two) times a week. , Disp: , Rfl:  .  latanoprost (XALATAN) 0.005 % ophthalmic solution, Place 1 drop into both eyes at bedtime., Disp: , Rfl: 99 .  Magnesium 250 MG TABS, Take 250 mg by mouth daily., Disp: , Rfl:  .  meclizine (ANTIVERT) 25 MG tablet, Take 1 tablet (25 mg total) by mouth 2 (two) times daily as needed for dizziness., Disp: 180 tablet, Rfl: 0 .  Polyethyl Glycol-Propyl Glycol (LUBRICANT EYE DROPS) 0.4-0.3 % SOLN, Place 1 drop into both eyes 3 (three) times daily as needed (for dry eyes.)., Disp: , Rfl:  .  sacubitril-valsartan (ENTRESTO) 24-26 MG, Take 1 tablet 2 (two) times daily by mouth., Disp: 180 tablet, Rfl: 3 .  spironolactone (ALDACTONE) 25 MG tablet, TAKE 1/2 TABLET(12.5 MG) BY MOUTH DAILY, Disp: 30 tablet, Rfl: 3 .  vitamin E 400 UNIT capsule, Take 400 Units by mouth daily., Disp: , Rfl:  .  warfarin  (COUMADIN) 3 MG tablet, Take as directed by [facility], Disp: 90 tablet, Rfl: 1  Past Medical History: Past Medical History:  Diagnosis Date  . Bleeding of eye   . Chronic systolic heart failure ([place])    a. 11/2016 Echo: EF 35-40%, mild MR w/ nl fxning bioprosthesis, mod dil LA.  . CKD (chronic kidney disease) stage 3, GFR 30-59 ml/min (HCC)    baseline Cr 1.5-2; prior saw nephrologist thought hypertensive nephropathy/renovascular disease  . CVA (cerebral infarction)    a. 2014 occipital lobe - some vision loss when coumadin held and not bridged  . Dyslipidemia   . Glaucoma   . H. pylori infection    treated  . H/O mitral valve replacement    a. 05/2012 s/p 75m Carpentier Edwards pericardial tissue valve.  .Marland KitchenHx of rheumatic fever   . Hypertension   . Melanoma ([place] 2006   L shoulder  . NICM (nonischemic cardiomyopathy) ([place]    a. 08/2013 s/p Biotronik Ilesto 7 HF1 BiV ICD (ser# [id number];  b. 11/2016 Echo: EF 35-40%.  . Non-obstructive CAD    a. s/p MI 2001;  b. 12/2016 Cath: LM 20ost, OM2 60, RCA 30p/d, EF 35-45%. Nl R heart filling pressures.  . 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 04/2012 s/p AVN & PPM placement (later upgraded to BiV ICD).  .Marland KitchenPhlebitis   .  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 06/2012 h/o acute ischemia due to thromboembolism radial and ulnar arteries when coumadin held s/p atherectomy - needs bridging if off coumadin  . Tricuspid regurgitation    a. 05/2012 s/p TV annuloplasty @ time of MVR.    Tobacco Use: Social History   Tobacco Use  Smoking Status Former Smoker  . Packs/day: 0.50  . Years: 20.00  . Pack years: 10.00  . Types: Cigarettes  . Last attempt to quit: 02/04/1990  . Years since quitting: 27.2  Smokeless Tobacco Never Used    Labs: Recent Chemical engineer    Labs for ITP Cardiac and Pulmonary Rehab Latest Ref Rng & Units 06/12/2014 06/27/2015 07/07/2016   Cholestrol 0 - 200 mg/dL 192 160 -   LDLCALC 0 - 99  mg/dL 104 96 -   HDL >39.00 mg/dL 74(A) 51.60 -   Trlycerides 0.0 - 149.0 mg/dL 70 62.0 -   Hemoglobin A1c 4.6 - 6.5 % - 6.2 6.1       Pulmonary Assessment Scores: Pulmonary Assessment Scores    Row Name 04/20/17 1453         ADL UCSD   ADL Phase  Entry     SOB Score total  42     Rest  0     Walk  1     Stairs  4     Bath  1     Dress  2     Shop  3       CAT Score   CAT Score  12       mMRC Score   mMRC Score  3        Pulmonary Function Assessment: Pulmonary Function Assessment - 04/20/17 1457      Breath   Bilateral Breath Sounds  Clear    Shortness of Breath  No;Limiting activity Her shortness of breath make it more of an annoyance for her       Exercise Target Goals:    Exercise Program Goal: Individual exercise prescription set with THRR, safety & activity barriers. Participant demonstrates ability to understand and report RPE using BORG scale, to self-measure pulse accurately, and to acknowledge the importance of the exercise prescription.  Exercise Prescription Goal: Starting with aerobic activity 30 plus minutes a day, 3 days per week for initial exercise prescription. Provide home exercise prescription and guidelines that participant acknowledges understanding prior to discharge.  Activity Barriers & Risk Stratification: Activity Barriers & Cardiac Risk Stratification - 04/20/17 1555      Activity Barriers & Cardiac Risk Stratification   Activity Barriers  Muscular Weakness;Shortness of Breath;Deconditioning;Balance Concerns;Decreased Ventricular Function       6 Minute Walk: 6 Minute Walk    Row Name 04/20/17 1553         6 Minute Walk   Phase  Initial     Distance  1260 feet     Walk Time  6 minutes     # of Rest Breaks  0     MPH  2.39     METS  2.6     RPE  11     Perceived Dyspnea   1.5     VO2 Peak  9.09     Symptoms  No     Resting HR  74 bpm     Resting BP  122/70     Resting Oxygen Saturation   98 %     Exercise  Oxygen  Saturation  during 6 min walk  93 %     Max Ex. HR  106 bpm     Max Ex. BP  126/60     2 Minute Post BP  122/64       Interval HR   1 Minute HR  104     2 Minute HR  101     3 Minute HR  91     4 Minute HR  86     5 Minute HR  98     6 Minute HR  106     2 Minute Post HR  76     Interval Heart Rate?  Yes       Interval Oxygen   Interval Oxygen?  Yes     Baseline Oxygen Saturation %  98 %     1 Minute Oxygen Saturation %  94 %     1 Minute Liters of Oxygen  0 L Room Air     2 Minute Oxygen Saturation %  94 %     2 Minute Liters of Oxygen  0 L     3 Minute Oxygen Saturation %  95 %     3 Minute Liters of Oxygen  0 L     4 Minute Oxygen Saturation %  94 %     4 Minute Liters of Oxygen  0 L     5 Minute Oxygen Saturation %  94 %     5 Minute Liters of Oxygen  0 L     6 Minute Oxygen Saturation %  93 %     6 Minute Liters of Oxygen  0 L     2 Minute Post Oxygen Saturation %  100 %     2 Minute Post Liters of Oxygen  0 L       Oxygen Initial Assessment: Oxygen Initial Assessment - 04/20/17 1504      Home Oxygen   Home Oxygen Device  None    Sleep Oxygen Prescription  None    Home Exercise Oxygen Prescription  None    Home at Rest Exercise Oxygen Prescription  None      Initial 6 min Walk   Oxygen Used  None      Program Oxygen Prescription   Program Oxygen Prescription  None      Intervention   Short Term Goals  To learn and understand importance of maintaining oxygen saturations>88%;To learn and demonstrate proper pursed lip breathing techniques or other breathing techniques.;To learn and understand importance of monitoring SPO2 with pulse oximeter and demonstrate accurate use of the pulse oximeter.    Argueta  Term Goals  Exhibits proper breathing techniques, such as pursed lip breathing or other method taught during program session;Verbalizes importance of monitoring SPO2 with pulse oximeter and return demonstration;Maintenance of O2 saturations>88%       Oxygen  Re-Evaluation: Oxygen Re-Evaluation    Row Name 04/27/17 1028             Program Oxygen Prescription   Program Oxygen Prescription  None         Home Oxygen   Home Oxygen Device  None       Sleep Oxygen Prescription  None       Home Exercise Oxygen Prescription  None       Home at Rest Exercise Oxygen Prescription  None         Goals/Expected Outcomes   Short Term Goals  To learn and understand importance of maintaining oxygen saturations>88%;To learn and demonstrate proper pursed lip breathing techniques or other breathing techniques.;To learn and understand importance of monitoring SPO2 with pulse oximeter and demonstrate accurate use of the pulse oximeter.       Karg  Term Goals  Exhibits proper breathing techniques, such as pursed lip breathing or other method taught during program session;Maintenance of O2 saturations>88%;Verbalizes importance of monitoring SPO2 with pulse oximeter and return demonstration       Comments  Reviewed PLB technique with pt.  Talked about how it work and it's important to maintaining his exercise saturations.         Goals/Expected Outcomes  Short: Become more profiecient at using PLB.   Chavis: Become independent at using PLB.          Oxygen Discharge (Final Oxygen Re-Evaluation): Oxygen Re-Evaluation - 04/27/17 1028      Program Oxygen Prescription   Program Oxygen Prescription  None      Home Oxygen   Home Oxygen Device  None    Sleep Oxygen Prescription  None    Home Exercise Oxygen Prescription  None    Home at Rest Exercise Oxygen Prescription  None      Goals/Expected Outcomes   Short Term Goals  To learn and understand importance of maintaining oxygen saturations>88%;To learn and demonstrate proper pursed lip breathing techniques or other breathing techniques.;To learn and understand importance of monitoring SPO2 with pulse oximeter and demonstrate accurate use of the pulse oximeter.    Bajaj  Term Goals  Exhibits proper breathing  techniques, such as pursed lip breathing or other method taught during program session;Maintenance of O2 saturations>88%;Verbalizes importance of monitoring SPO2 with pulse oximeter and return demonstration    Comments  Reviewed PLB technique with pt.  Talked about how it work and it's important to maintaining his exercise saturations.      Goals/Expected Outcomes  Short: Become more profiecient at using PLB.   Tregoning: Become independent at using PLB.       Initial Exercise Prescription: Initial Exercise Prescription - 04/20/17 1500      Date of Initial Exercise RX and Referring Provider   Date  04/20/17    Referring Provider  [name] MD      Treadmill   MPH  2.1    Grade  0    Minutes  15    METs  2.61      NuStep   Level  2    SPM  80    Minutes  15    METs  2.6      REL-XR   Level  1    Speed  50    Minutes  15    METs  2.6      Prescription Details   Frequency (times per week)  3    Duration  Progress to 45 minutes of aerobic exercise without signs/symptoms of physical distress      Intensity   THRR 40-80% of Max Heartrate  103-132    Ratings of Perceived Exertion  11-13    Perceived Dyspnea  0-4      Progression   Progression  Continue to progress workloads to maintain intensity without signs/symptoms of physical distress.      Resistance Training   Training Prescription  Yes    Weight  3 lbs    Reps  10-15       Perform Capillary Blood Glucose checks as needed.  Exercise  Prescription Changes: Exercise Prescription Changes    Row Name 04/20/17 1500 04/29/17 1200 05/01/17 1000         Response to Exercise   Blood Pressure (Admit)  122/70  126/64  -     Blood Pressure (Exercise)  126/60  -  -     Blood Pressure (Exit)  122/64  114/56  -     Heart Rate (Admit)  74 bpm  77 bpm  -     Heart Rate (Exercise)  106 bpm  100 bpm  -     Heart Rate (Exit)  76 bpm  75 bpm  -     Oxygen Saturation (Admit)  98 %  98 %  -     Oxygen Saturation (Exercise)   93 %  99 %  -     Oxygen Saturation (Exit)  100 %  99 %  -     Rating of Perceived Exertion (Exercise)  11  10  -     Perceived Dyspnea (Exercise)  1.5  1  -     Symptoms  none  none  -     Comments  walk test results  -  -     Duration  -  Progress to 45 minutes of aerobic exercise without signs/symptoms of physical distress  -     Intensity  -  THRR unchanged  -       Progression   Progression  -  Continue to progress workloads to maintain intensity without signs/symptoms of physical distress.  -     Average METs  -  3.3  -       Resistance Training   Training Prescription  -  Yes  -     Weight  -  3 lbs  -     Reps  -  10-15  -       Interval Training   Interval Training  -  No  -       NuStep   Level  -  2  -     SPM  -  91  -     Minutes  -  15  -     METs  -  2.7  -       REL-XR   Level  -  1  -     Speed  -  50  -     Minutes  -  15  -     METs  -  3.9  -       Home Exercise Plan   Plans to continue exercise at  -  -  Home (comment) walking at home.     Frequency  -  -  Add 2 additional days to program exercise sessions.     Initial Home Exercises Provided  -  -  05/01/17        Exercise Comments: Exercise Comments    Row Name 04/27/17 1028 05/01/17 1101         Exercise Comments  First full day of exercise!  Patient was oriented to gym and equipment including functions, settings, policies, and procedures.  Patient's individual exercise prescription and treatment plan were reviewed.  All starting workloads were established based on the results of the 6 minute walk test done at initial orientation visit.  The plan for exercise progression was also introduced and progression will be customized based on patient's performance and goals.  Reviewed home exercise  with pt today.  Pt plans to walk 2 extra days a week at home for exercise.  Reviewed THR, pulse, RPE, sign and symptoms, NTG use, and when to call 911 or MD.  Also discussed weather considerations and indoor  options.  Pt voiced understanding         Exercise Goals and Review: Exercise Goals    Row Name 04/20/17 1558             Exercise Goals   Increase Physical Activity  Yes       Intervention  Provide advice, education, support and counseling about physical activity/exercise needs.;Develop an individualized exercise prescription for aerobic and resistive training based on initial evaluation findings, risk stratification, comorbidities and participant's personal goals.       Expected Outcomes  Achievement of increased cardiorespiratory fitness and enhanced flexibility, muscular endurance and strength shown through measurements of functional capacity and personal statement of participant.       Increase Strength and Stamina  Yes       Intervention  Provide advice, education, support and counseling about physical activity/exercise needs.;Develop an individualized exercise prescription for aerobic and resistive training based on initial evaluation findings, risk stratification, comorbidities and participant's personal goals.       Expected Outcomes  Achievement of increased cardiorespiratory fitness and enhanced flexibility, muscular endurance and strength shown through measurements of functional capacity and personal statement of participant.       Able to understand and use rate of perceived exertion (RPE) scale  Yes       Intervention  Provide education and explanation on how to use RPE scale       Expected Outcomes  Short Term: Able to use RPE daily in rehab to express subjective intensity level;Alfredo Term:  Able to use RPE to guide intensity level when exercising independently       Able to understand and use Dyspnea scale  Yes       Intervention  Provide education and explanation on how to use Dyspnea scale       Expected Outcomes  Short Term: Able to use Dyspnea scale daily in rehab to express subjective sense of shortness of breath during exertion;Brucker Term: Able to use Dyspnea scale to  guide intensity level when exercising independently       Knowledge and understanding of Target Heart Rate Range (THRR)  Yes       Intervention  Provide education and explanation of THRR including how the numbers were predicted and where they are located for reference       Expected Outcomes  Short Term: Able to state/look up THRR;Marriott Term: Able to use THRR to govern intensity when exercising independently;Short Term: Able to use daily as guideline for intensity in rehab       Able to check pulse independently  Yes       Intervention  Provide education and demonstration on how to check pulse in carotid and radial arteries.;Review the importance of being able to check your own pulse for safety during independent exercise       Expected Outcomes  Short Term: Able to explain why pulse checking is important during independent exercise;Balzarini Term: Able to check pulse independently and accurately       Understanding of Exercise Prescription  Yes       Intervention  Provide education, explanation, and written materials on patient's individual exercise prescription       Expected Outcomes  Short Term: Able to explain program  exercise prescription;Skoog Term: Able to explain home exercise prescription to exercise independently          Exercise Goals Re-Evaluation : Exercise Goals Re-Evaluation    Row Name 04/27/17 1028 04/29/17 1245 05/01/17 1056         Exercise Goal Re-Evaluation   Exercise Goals Review  Understanding of Exercise Prescription;Able to understand and use Dyspnea scale;Knowledge and understanding of Target Heart Rate Range (THRR);Able to understand and use rate of perceived exertion (RPE) scale  Increase Physical Activity;Increase Strength and Stamina;Able to understand and use Dyspnea scale;Able to understand and use rate of perceived exertion (RPE) scale  Increase Physical Activity;Increase Strength and Stamina     Comments  Reviewed RPE scale, THR and program prescription with pt  today.  Pt voiced understanding and was given a copy of goals to take home.   Elizabeth Hall has tolerated exrecise well in her first two sessions.  Staff will continue to monitor for progress.  Reviewed home exercise with pt today.  Pt plans to walk 2 extra days a week at home for exercise.  Reviewed THR, pulse, RPE, sign and symptoms, NTG use, and when to call 911 or MD.  Also discussed weather considerations and indoor options.  Pt voiced understanding     Expected Outcomes  Short: Use RPE daily to regulate intensity.  Fay: Follow program prescription in THR.  Short - Elizabeth Hall will attend class regularly.  Grall - Elizabeth Hall will increase overall MET level.  Short: add 2 extra days of walking at home. Remmel: add extra days of exercising.        Discharge Exercise Prescription (Final Exercise Prescription Changes): Exercise Prescription Changes - 05/01/17 1000      Home Exercise Plan   Plans to continue exercise at  Home (comment) walking at home.    Frequency  Add 2 additional days to program exercise sessions.    Initial Home Exercises Provided  05/01/17       Nutrition:  Target Goals: Understanding of nutrition guidelines, daily intake of sodium '1500mg'$ , cholesterol '200mg'$ , calories 30% from fat and 7% or less from saturated fats, daily to have 5 or more servings of fruits and vegetables.  Biometrics: Pre Biometrics - 04/20/17 1558      Pre Biometrics   Height  5' 3.5" (1.613 m)    Weight  153 lb 11.2 oz (69.7 kg)    Waist Circumference  34 inches    Hip Circumference  39.75 inches    Waist to Hip Ratio  0.86 %    BMI (Calculated)  26.8        Nutrition Therapy Plan and Nutrition Goals: Nutrition Therapy & Goals - 04/20/17 1448      Personal Nutrition Goals   Nutrition Goal  Eat healthier and lose weight.    Comments  She would like to see the dietician      Intervention Plan   Intervention  Prescribe, educate and counsel regarding individualized specific dietary modifications aiming towards  targeted core components such as weight, hypertension, lipid management, diabetes, heart failure and other comorbidities.;Nutrition handout(s) given to patient.    Expected Outcomes  Short Term Goal: Understand basic principles of dietary content, such as calories, fat, sodium, cholesterol and nutrients.;Short Term Goal: A plan has been developed with personal nutrition goals set during dietitian appointment.;Amaker Term Goal: Adherence to prescribed nutrition plan.       Nutrition Discharge: Rate Your Plate Scores: Nutrition Assessments - 04/20/17 1600  MEDFICTS Scores   Pre Score  35       Nutrition Goals Re-Evaluation: Nutrition Goals Re-Evaluation    Kimball Name 05/06/17 1109             Goals   Current Weight  151 lb 1.6 oz (68.5 kg)       Nutrition Goal  Eat healthier and lose weight.       Comment  Elizabeth Hall has made an appointment to see the dietician.       Expected Outcome  Short: meet with dietician. Pillard: Adhere to a diet plan.          Nutrition Goals Discharge (Final Nutrition Goals Re-Evaluation): Nutrition Goals Re-Evaluation - 05/06/17 1109      Goals   Current Weight  151 lb 1.6 oz (68.5 kg)    Nutrition Goal  Eat healthier and lose weight.    Comment  Elizabeth Hall has made an appointment to see the dietician.    Expected Outcome  Short: meet with dietician. Brackin: Adhere to a diet plan.       Psychosocial: Target Goals: Acknowledge presence or absence of significant depression and/or stress, maximize coping skills, provide positive support system. Participant is able to verbalize types and ability to use techniques and skills needed for reducing stress and depression.   Initial Review & Psychosocial Screening: Initial Psych Review & Screening - 04/20/17 1446      Initial Review   Current issues with  Current Sleep Concerns;Current Stress Concerns    Source of Stress Concerns  Family    Comments  Husband has cancer, he was diagnosed five years ago and is on IV  chemo.       Family Dynamics   Good Support System?  Yes    Comments  Patient has one daughter and she lives in [place]. She has one grandson and two step grandkids.      Barriers   Psychosocial barriers to participate in program  The patient should benefit from training in stress management and relaxation.      Screening Interventions   Interventions  Yes;Encouraged to exercise;Program counselor consult;Provide feedback about the scores to participant;To provide support and resources with identified psychosocial needs    Expected Outcomes  Short Term goal: Utilizing psychosocial counselor, staff and physician to assist with identification of specific Stressors or current issues interfering with healing process. Setting desired goal for each stressor or current issue identified.;Mulroy Term Goal: Stressors or current issues are controlled or eliminated.;Short Term goal: Identification and review with participant of any Quality of Life or Depression concerns found by scoring the questionnaire.;Cayer Term goal: The participant improves quality of Life and PHQ9 Scores as seen by post scores and/or verbalization of changes       Quality of Life Scores:   PHQ-9: Recent Review Flowsheet Data    Depression screen [facility] 2/9 04/20/2017 07/07/2016 07/02/2015   Decreased Interest 0 0 0   Down, Depressed, Hopeless 0 0 0   PHQ - 2 Score 0 0 0   Altered sleeping 1 - -   Tired, decreased energy 1 - -   Change in appetite 1 - -   Feeling bad or failure about yourself  0 - -   Trouble concentrating 0 - -   Moving slowly or fidgety/restless 0 - -   Suicidal thoughts 0 - -   PHQ-9 Score 3 - -   Difficult doing work/chores Not difficult at all - -  Interpretation of Total Score  Total Score Depression Severity:  1-4 = Minimal depression, 5-9 = Mild depression, 10-14 = Moderate depression, 15-19 = Moderately severe depression, 20-27 = Severe depression   Psychosocial Evaluation and  Intervention: Psychosocial Evaluation - 04/27/17 1123      Psychosocial Evaluation & Interventions   Interventions  Encouraged to exercise with the program and follow exercise prescription;Stress management education;Relaxation education    Comments  Counselor met with Ms. Bargo Elizabeth Hall) today for initial psychosocial evaluation.  She is a 76 year old who has been experiencing shortness of breath recently.  She has a strong support system with a spouse of 35+ years; an adult daughter and son in law who live close by and friends and active involvement in her local church.  Elizabeth Hall has had multiple health isues with open heart surgery in 2013 and did a Cardiac Rehab program following that in [place], [place] was residing at the time.  Elizabeth Hall states she sleeps fairly well with 7-8 hours of sleep and her appetite is good as well.  She denies a history of depression or anxiety and is typically in a positive mood currently.  She has multiple stressors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

## 2017-05-12 ENCOUNTER — Ambulatory Visit (INDEPENDENT_AMBULATORY_CARE_PROVIDER_SITE_OTHER): Payer: Medicare Other | Admitting: *Deleted

## 2017-05-12 DIAGNOSIS — I428 Other cardiomyopathies: Secondary | ICD-10-CM | POA: Diagnosis not present

## 2017-05-12 DIAGNOSIS — I5022 Chronic systolic (congestive) heart failure: Secondary | ICD-10-CM

## 2017-05-13 NOTE — Progress Notes (Signed)
Remote ICD transmission.   

## 2017-05-15 ENCOUNTER — Encounter: Payer: Self-pay | Admitting: Emergency Medicine

## 2017-05-15 ENCOUNTER — Emergency Department: Payer: Medicare Other

## 2017-05-15 ENCOUNTER — Other Ambulatory Visit: Payer: Self-pay

## 2017-05-15 ENCOUNTER — Telehealth: Payer: Self-pay | Admitting: Physician Assistant

## 2017-05-15 ENCOUNTER — Emergency Department
Admission: EM | Admit: 2017-05-15 | Discharge: 2017-05-15 | Disposition: A | Discharge status: Home / Self Care | Payer: Medicare Other | Attending: Emergency Medicine | Admitting: Emergency Medicine

## 2017-05-15 DIAGNOSIS — Y939 Activity, unspecified: Secondary | ICD-10-CM | POA: Diagnosis not present

## 2017-05-15 DIAGNOSIS — Y999 Unspecified external cause status: Secondary | ICD-10-CM | POA: Diagnosis not present

## 2017-05-15 DIAGNOSIS — Z87891 Personal history of nicotine dependence: Secondary | ICD-10-CM | POA: Insufficient documentation

## 2017-05-15 DIAGNOSIS — W11XXXA Fall on and from ladder, initial encounter: Secondary | ICD-10-CM | POA: Insufficient documentation

## 2017-05-15 DIAGNOSIS — Z7982 Long term (current) use of aspirin: Secondary | ICD-10-CM | POA: Insufficient documentation

## 2017-05-15 DIAGNOSIS — S0990XA Unspecified injury of head, initial encounter: Secondary | ICD-10-CM

## 2017-05-15 DIAGNOSIS — I130 Hypertensive heart and chronic kidney disease with heart failure and stage 1 through stage 4 chronic kidney disease, or unspecified chronic kidney disease: Secondary | ICD-10-CM | POA: Insufficient documentation

## 2017-05-15 DIAGNOSIS — N183 Chronic kidney disease, stage 3 (moderate): Secondary | ICD-10-CM | POA: Diagnosis not present

## 2017-05-15 DIAGNOSIS — Z7901 Long term (current) use of anticoagulants: Secondary | ICD-10-CM | POA: Insufficient documentation

## 2017-05-15 DIAGNOSIS — Z950 Presence of cardiac pacemaker: Secondary | ICD-10-CM | POA: Insufficient documentation

## 2017-05-15 DIAGNOSIS — Y929 Unspecified place or not applicable: Secondary | ICD-10-CM | POA: Insufficient documentation

## 2017-05-15 DIAGNOSIS — I5022 Chronic systolic (congestive) heart failure: Secondary | ICD-10-CM | POA: Insufficient documentation

## 2017-05-15 DIAGNOSIS — Z79899 Other long term (current) drug therapy: Secondary | ICD-10-CM | POA: Insufficient documentation

## 2017-05-15 DIAGNOSIS — W19XXXA Unspecified fall, initial encounter: Secondary | ICD-10-CM

## 2017-05-15 DIAGNOSIS — S0003XA Contusion of scalp, initial encounter: Secondary | ICD-10-CM | POA: Diagnosis not present

## 2017-05-15 MED ORDER — ACETAMINOPHEN 325 MG PO TABS
ORAL_TABLET | ORAL | Status: AC
  Filled 2017-05-15: qty 2

## 2017-05-15 MED ORDER — ACETAMINOPHEN 325 MG PO TABS
650.0000 mg | ORAL_TABLET | Freq: Once | ORAL | Status: AC
  Administered 2017-05-15: 650 mg via ORAL

## 2017-05-15 NOTE — ED Provider Notes (Signed)
[facility] Emergency Department Provider Note   ____________________________________________    I have reviewed the triage vital signs and the nursing notes.   HISTORY  Chief Complaint Fall     HPI Elizabeth Hall is a 75 y.o. female who presents after a fall.  Patient does take Coumadin.  Patient reports she missed stepped off of a stepping stool and fell backwards hitting her head on the floor.  She denies LOC.  No neuro deficits.  No neck pain.  No significant back pain.  She did bump her elbow but is able to move it well.  No nausea or vomiting or change in vision.  Feels well currently, does note mild headache   Past Medical History:  Diagnosis Date  . Bleeding of eye   . Chronic systolic heart failure ([place])    a. 11/2016 Echo: EF 35-40%, mild MR w/ nl fxning bioprosthesis, mod dil LA.  . CKD (chronic kidney disease) stage 3, GFR 30-59 ml/min (HCC)    baseline Cr 1.5-2; prior saw nephrologist thought hypertensive nephropathy/renovascular disease  . CVA (cerebral infarction)    a. 2014 occipital lobe - some vision loss when coumadin held and not bridged  . Dyslipidemia   . Glaucoma   . H. pylori infection    treated  . H/O mitral valve replacement    a. 05/2012 s/p 44mm Carpentier Edwards pericardial tissue valve.  [name] Hx of rheumatic fever   . Hypertension   . Melanoma ([place]) 2006   L shoulder  . NICM (nonischemic cardiomyopathy) ([place])    a. 08/2013 s/p Biotronik Ilesto 7 HF1 BiV ICD (ser# [id number]);  b. 11/2016 Echo: EF 35-40%.  . Non-obstructive CAD    a. s/p MI 2001;  b. 12/2016 Cath: LM 20ost, OM2 60, RCA 30p/d, EF 35-45%. Nl R heart filling pressures.  . Permanent atrial fibrillation ([place])    a. 04/2012 s/p AVN & PPM placement (later upgraded to BiV ICD).  [name] Phlebitis   . 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 06/2012 h/o acute ischemia due to thromboembolism radial and ulnar arteries when coumadin held s/p atherectomy  - needs bridging if off coumadin  . Tricuspid regurgitation    a. 05/2012 s/p TV annuloplasty @ time of MVR.    Patient Active Problem List   Diagnosis Date Noted  . Mitral valve disease   . Shortness of breath   . URI (upper respiratory infection) 08/03/2016  . Medicare annual wellness visit, subsequent 07/02/2015  . Advanced care planning/counseling discussion 07/02/2015  . History of melanoma 07/02/2015  . Prediabetes 06/25/2015  . Postmenopausal HRT (hormone replacement therapy) 01/30/2015  . Glaucoma   . CVA (cerebral infarction)   . CKD (chronic kidney disease) stage 3, GFR 30-59 ml/min (HCC)   . Dyslipidemia   . Hypertension   . Encounter for therapeutic drug monitoring 10/04/2014  . Status post mitral valve replacement with tissue valve 09/20/2014  . Status post biventricular pacemaker 09/20/2014  . Chronic systolic heart failure ([place])   . Atrial fibrillation ([place]) 09/18/2014    Past Surgical History:  Procedure Laterality Date  . ABDOMINAL HYSTERECTOMY    . ABLATION  2013  . ATHERECTOMY  06/2012   left arm after coumadin held without bridging  . BREAST BIOPSY    . CARDIOVASCULAR STRESS TEST  03/2016   WNL  . CATARACT EXTRACTION Bilateral 2007, 2010  . COLONOSCOPY  03/2014   diverticulosis, int hem, rpt 5 yrs  for h/o polyps ([place])  . defibrillator     MEDTRONIC 5086 MRI 52 CM LEAD, SERIAL # [id number]  . DEXA  07/2016   WNL  . DG ABDOMEN COMPLETE ([place] [facility])  02/2013   HH, mod severe erosive gastritis, duodenitis  . GALLBLADDER SURGERY  2006  . INSERT / REPLACE / REMOVE PACEMAKER  08/2013  . MITRAL VALVE REPLACEMENT  05/2012  . RIGHT/LEFT HEART CATH AND CORONARY ANGIOGRAPHY N/A 12/29/2016   Procedure: Right/Left Heart Cath and Coronary Angiography;  Surgeon: [name], MD;  Location: [facility];  Service: Cardiovascular;  Laterality: N/A;  . TOTAL ABDOMINAL HYSTERECTOMY W/ BILATERAL SALPINGOOPHORECTOMY  1991   irregular bleeding  . TRICUSPID VALVE  REPLACEMENT  05/2012  . US ECHOCARDIOGRAPHY  02/2014   EF 40%, LA marked dilation, gen LV hypokinesis, mitral prosthetic valve    Prior to Admission medications   Medication Sig Start Date End Date Taking? Authorizing Provider  acetaminophen (TYLENOL) 500 MG tablet Take 500-1,000 mg by mouth every 6 (six) hours as needed (for headaches/pain.).    [provider]  aspirin EC 81 MG tablet Take 81 mg by mouth daily.    [provider]  bumetanide (BUMEX) 2 MG tablet TAKE 1 TABLET(2 MG) BY MOUTH DAILY 07/07/16   [name], MD  carvedilol (COREG) 12.5 MG tablet Take 1 tablet (12.5 mg total) by mouth 2 (two) times daily with a meal. 04/20/17   [name], MD  estradiol (ESTRACE) 1 MG tablet Take 0.5 mg by mouth 2 (two) times a week.     [provider]  latanoprost (XALATAN) 0.005 % ophthalmic solution Place 1 drop into both eyes at bedtime. 12/15/16   [provider]  Magnesium 250 MG TABS Take 250 mg by mouth daily.    [provider]  meclizine (ANTIVERT) 25 MG tablet Take 1 tablet (25 mg total) by mouth 2 (two) times daily as needed for dizziness. 10/10/16   [name], MD  Polyethyl Glycol-Propyl Glycol (LUBRICANT EYE DROPS) 0.4-0.3 % SOLN Place 1 drop into both eyes 3 (three) times daily as needed (for dry eyes.).    [provider]  sacubitril-valsartan (ENTRESTO) 24-26 MG Take 1 tablet 2 (two) times daily by mouth. 04/29/17   [name], MD  spironolactone (ALDACTONE) 25 MG tablet TAKE 1/2 TABLET(12.5 MG) BY MOUTH DAILY 05/01/17   [name], MD  vitamin E 400 UNIT capsule Take 400 Units by mouth daily.    [provider]  warfarin (COUMADIN) 3 MG tablet Take as directed by [facility] 04/24/17   [name], MD     Allergies Amiodarone; Phytonadione [vitamin k1]; Biaxin [clarithromycin]; Ciprofloxacin; Flagyl [metronidazole]; and Lisinopril  Family History  Problem Relation Age of Onset    . CAD Father 58       MI  . Hypertension Brother   . Heart failure Brother        CHF with pacemaker  . Stroke Brother   . Heart failure Sister        CHF  . Stroke Sister   . Diabetes Cousin   . Cancer Maternal Aunt        colon  . Cancer Cousin        breast  . Cancer Cousin        brain tumor    Social History Social History   Tobacco Use  . Smoking status: Former Smoker  Packs/day: 0.50    Years: 20.00    Pack years: 10.00    Types: Cigarettes    Last attempt to quit: 02/04/1990    Years since quitting: 27.2  . Smokeless tobacco: Never Used  Substance Use Topics  . Alcohol use: Yes    Alcohol/week: 0.0 oz    Comment: occasional  . Drug use: No    Review of Systems  Constitutional: No dizziness Eyes: No visual changes.  ENT: No neck pain Cardiovascular: Denies chest pain. Respiratory: Denies shortness of breath. Gastrointestinal: No abdominal pain.  No nausea, no vomiting.   Genitourinary: Negative for dysuria. Musculoskeletal: Negative for back pain. Skin: Negative for laceration Neurological: As above   ____________________________________________   PHYSICAL EXAM:  VITAL SIGNS: ED Triage Vitals  Enc Vitals Group     BP 05/15/17 1724 (!) 140/93     Pulse Rate 05/15/17 1724 73     Resp 05/15/17 1724 16     Temp 05/15/17 1724 (!) 97.3 F (36.3 C)     Temp Source 05/15/17 1724 Oral     SpO2 05/15/17 1724 97 %     Weight 05/15/17 1725 68 kg (150 lb)     Height 05/15/17 1725 1.6 m (5\' 3" )     Head Circumference --      Peak Flow --      Pain Score 05/15/17 1723 7     Pain Loc --      Pain Edu? --      Excl. in [place]? --     Constitutional: Alert and oriented. No acute distress. Pleasant and interactive Eyes: Conjunctivae are normal.  Head: Small posterior hematoma, no laceration Nose: No congestion/rhinnorhea. Mouth/Throat: Mucous membranes are moist.   Neck:  Painless ROM, no vertebral tenderness to palpation at Cardiovascular: Normal  rate, regular rhythm. Grossly normal heart sounds.  Good peripheral circulation. Respiratory: Normal respiratory effort.  No retractions. Lungs CTAB. Gastrointestinal: Soft and nontender. No distention.  No CVA tenderness. Genitourinary: deferred Musculoskeletal: Back: No significant vertebral tenderness to palpation,  no lower extremity tenderness.  Full range of motion of both upper extremities at the shoulder and elbow.  Warm and well perfused Neurologic:  Normal speech and language. No gross focal neurologic deficits are appreciated.  Skin:  Skin is warm, dry and intact. No rash noted. Psychiatric: Mood and affect are normal. Speech and behavior are normal.  ____________________________________________   LABS (all labs ordered are listed, but only abnormal results are displayed)  Labs Reviewed - No data to display ____________________________________________  EKG  None ____________________________________________  RADIOLOGY  CT negative ____________________________________________   PROCEDURES  Procedure(s) performed: No  Procedures   Critical Care performed:No ____________________________________________   INITIAL IMPRESSION / ASSESSMENT AND PLAN / ED COURSE  Pertinent labs & imaging results that were available during my care of the patient were reviewed by me and considered in my medical decision making (see chart for details).  Patient presented for head injury post fall on Coumadin.  CT head is reassuring.  I discussed with the patient at length the risk of delayed bleeding on Coumadin, despite this she does not want to stay in the emergency department she would like to go home.  Daughter also agrees with this plan and states she will check on the patient frequently this evening.    ____________________________________________   FINAL CLINICAL IMPRESSION(S) / ED DIAGNOSES  Final diagnoses:  Fall, initial encounter  Injury of head, initial encounter  Note:  This document was prepared using Dragon voice recognition software and may include unintentional dictation errors.     Lavonia Drafts, MD 05/15/17 [phone]

## 2017-05-15 NOTE — Telephone Encounter (Signed)
Pt had a fall, they are taking her to the closest ER. Daughter was concerned about her being on blood thinners.  Advised that ER staff would review medications and evaluate her carefully, taking the blood thinners into account.   Rosaria Ferries, PA-C 05/15/2017 5:12 PM Beeper [phone]

## 2017-05-15 NOTE — ED Notes (Signed)
Patient transported to CT 

## 2017-05-15 NOTE — ED Triage Notes (Signed)
Pt to ED via POV, pt states that she was standing on a step stool on the 2nd step. Pt states that she stepped back and fell, hitting her head on the floor. Pt has laceration to the back of the head. Pt is on Coumadin. Pt denies LOC. Pt states that she is having some nausea. Pt in NAD at this time.

## 2017-05-18 DIAGNOSIS — Z79899 Other long term (current) drug therapy: Secondary | ICD-10-CM | POA: Diagnosis not present

## 2017-05-18 DIAGNOSIS — Z7951 Long term (current) use of inhaled steroids: Secondary | ICD-10-CM | POA: Diagnosis not present

## 2017-05-18 DIAGNOSIS — Z7982 Long term (current) use of aspirin: Secondary | ICD-10-CM | POA: Diagnosis not present

## 2017-05-18 DIAGNOSIS — I130 Hypertensive heart and chronic kidney disease with heart failure and stage 1 through stage 4 chronic kidney disease, or unspecified chronic kidney disease: Secondary | ICD-10-CM | POA: Diagnosis not present

## 2017-05-18 DIAGNOSIS — I509 Heart failure, unspecified: Secondary | ICD-10-CM

## 2017-05-18 DIAGNOSIS — I5022 Chronic systolic (congestive) heart failure: Secondary | ICD-10-CM | POA: Diagnosis not present

## 2017-05-18 DIAGNOSIS — N183 Chronic kidney disease, stage 3 (moderate): Secondary | ICD-10-CM | POA: Diagnosis not present

## 2017-05-18 NOTE — Progress Notes (Signed)
Daily Session Note  Patient Details  Name: Elizabeth Hall MRN: [id number] Date of Birth: Dec 08, 1940 Referring Provider:     Pulmonary Rehab from 04/20/2017 in [facility] Cardiac and Pulmonary Rehab  Referring Provider  [name] MD      Encounter Date: 05/18/2017  Check In: Session Check In - 05/18/17 1019      Check-In   Location  [facility]-Cardiac & Pulmonary Rehab    Staff Present  Nada Maclachlan, BA, ACSM CEP, Exercise Physiologist;Kelly Amedeo Plenty, BS, ACSM CEP, Exercise Physiologist;Milon Dethloff Flavia Shipper    Supervising physician immediately available to respond to emergencies  [name] immediately available ER MD    Physician(s)  Dr. Quentin Cornwall and Corky Downs    Medication changes reported      No    Fall or balance concerns reported     No    Warm-up and Cool-down  Performed as group-led instruction    Resistance Training Performed  Yes    VAD Patient?  No      Pain Assessment   Currently in Pain?  No/denies          Social History   Tobacco Use  Smoking Status Former Smoker  . Packs/day: 0.50  . Years: 20.00  . Pack years: 10.00  . Types: Cigarettes  . Last attempt to quit: 02/04/1990  . Years since quitting: 27.3  Smokeless Tobacco Never Used    Goals Met:  Independence with exercise equipment Exercise tolerated well No report of cardiac concerns or symptoms Strength training completed today  Goals Unmet:  Not Applicable  Comments: Pt able to follow exercise prescription today without complaint.  Will continue to monitor for progression.   Dr. Emily Filbert is Medical Director for [place] and [name].

## 2017-05-19 DIAGNOSIS — M65331 Trigger finger, right middle finger: Secondary | ICD-10-CM | POA: Diagnosis not present

## 2017-05-20 DIAGNOSIS — Z79899 Other long term (current) drug therapy: Secondary | ICD-10-CM | POA: Diagnosis not present

## 2017-05-20 DIAGNOSIS — I5022 Chronic systolic (congestive) heart failure: Secondary | ICD-10-CM | POA: Diagnosis not present

## 2017-05-20 DIAGNOSIS — I130 Hypertensive heart and chronic kidney disease with heart failure and stage 1 through stage 4 chronic kidney disease, or unspecified chronic kidney disease: Secondary | ICD-10-CM | POA: Diagnosis not present

## 2017-05-20 DIAGNOSIS — Z7951 Long term (current) use of inhaled steroids: Secondary | ICD-10-CM | POA: Diagnosis not present

## 2017-05-20 DIAGNOSIS — N183 Chronic kidney disease, stage 3 (moderate): Secondary | ICD-10-CM | POA: Diagnosis not present

## 2017-05-20 DIAGNOSIS — Z7982 Long term (current) use of aspirin: Secondary | ICD-10-CM | POA: Diagnosis not present

## 2017-05-20 DIAGNOSIS — I509 Heart failure, unspecified: Secondary | ICD-10-CM

## 2017-05-20 NOTE — Progress Notes (Signed)
Daily Session Note  Patient Details  Name: Elizabeth Hall MRN: [id number] Date of Birth: 31-Aug-1940 Referring Provider:     Pulmonary Rehab from 04/20/2017 in [facility] Cardiac and Pulmonary Rehab  Referring Provider  [name] MD      Encounter Date: 05/20/2017  Check In: Session Check In - 05/20/17 1005      Check-In   Location  [facility]Cardiac & Pulmonary Rehab    Staff Present  Justin Mend Lorre Nick, MA, ACSM RCEP, Exercise Physiologist;Amanda Oletta Darter, [place], ACSM CEP, Exercise Physiologist    Supervising physician immediately available to respond to emergencies  [name] immediately available ER MD    Physician(s)  Dr. Mable Paris and [facility]    Medication changes reported      No    Fall or balance concerns reported     No    Warm-up and Cool-down  Performed as group-led instruction    Resistance Training Performed  Yes    VAD Patient?  No      Pain Assessment   Currently in Pain?  No/denies          Social History   Tobacco Use  Smoking Status Former Smoker  . Packs/day: 0.50  . Years: 20.00  . Pack years: 10.00  . Types: Cigarettes  . Last attempt to quit: 02/04/1990  . Years since quitting: 27.3  Smokeless Tobacco Never Used    Goals Met:  Independence with exercise equipment Exercise tolerated well Personal goals reviewed No report of cardiac concerns or symptoms Strength training completed today  Goals Unmet:  Not Applicable  Comments: Pt able to follow exercise prescription today without complaint.  Will continue to monitor for progression.   Dr. Emily Filbert is Medical Director for [place] and [name].

## 2017-05-21 ENCOUNTER — Encounter: Payer: Self-pay | Admitting: Cardiology

## 2017-05-22 DIAGNOSIS — I509 Heart failure, unspecified: Secondary | ICD-10-CM

## 2017-05-22 DIAGNOSIS — I130 Hypertensive heart and chronic kidney disease with heart failure and stage 1 through stage 4 chronic kidney disease, or unspecified chronic kidney disease: Secondary | ICD-10-CM | POA: Diagnosis not present

## 2017-05-22 DIAGNOSIS — Z7951 Long term (current) use of inhaled steroids: Secondary | ICD-10-CM | POA: Diagnosis not present

## 2017-05-22 DIAGNOSIS — Z79899 Other long term (current) drug therapy: Secondary | ICD-10-CM | POA: Diagnosis not present

## 2017-05-22 DIAGNOSIS — I5022 Chronic systolic (congestive) heart failure: Secondary | ICD-10-CM | POA: Diagnosis not present

## 2017-05-22 DIAGNOSIS — N183 Chronic kidney disease, stage 3 (moderate): Secondary | ICD-10-CM | POA: Diagnosis not present

## 2017-05-22 DIAGNOSIS — Z7982 Long term (current) use of aspirin: Secondary | ICD-10-CM | POA: Diagnosis not present

## 2017-05-22 NOTE — Progress Notes (Signed)
Daily Session Note  Patient Details  Name: Elizabeth Hall MRN: [id number] Date of Birth: [birth year] Referring Provider:     Pulmonary Rehab from 04/20/2017 in [facility] Cardiac and Pulmonary Rehab  Referring Provider  [name] MD      Encounter Date: 05/22/2017  Check In: Session Check In - 05/22/17 1012      Check-In   Location  [facility]Cardiac & Pulmonary Rehab    Staff Present  Nada Maclachlan, BA, ACSM CEP, Exercise Physiologist;Hurman Ketelsen Tessie Fass RCP,RRT,BSRT;Carroll Enterkin, RN, BSN    Supervising physician immediately available to respond to emergencies  [name] immediately available ER MD    Physician(s)  Dr. Jacqualine Code and Reita Cliche    Medication changes reported      No    Fall or balance concerns reported     No    Warm-up and Cool-down  Performed as group-led instruction    Resistance Training Performed  Yes    VAD Patient?  No      Pain Assessment   Currently in Pain?  No/denies          Social History   Tobacco Use  Smoking Status Former Smoker  . Packs/day: 0.50  . Years: 20.00  . Pack years: 10.00  . Types: Cigarettes  . Last attempt to quit: 02/04/1990  . Years since quitting: 27.3  Smokeless Tobacco Never Used    Goals Met:  Independence with exercise equipment Exercise tolerated well No report of cardiac concerns or symptoms Strength training completed today  Goals Unmet:  Not Applicable  Comments: Pt able to follow exercise prescription today without complaint.  Will continue to monitor for progression.   Dr. Emily Filbert is Medical Director for [place] and [name].

## 2017-05-25 ENCOUNTER — Encounter: Payer: Medicare Other | Attending: Pulmonary Disease

## 2017-05-25 DIAGNOSIS — I482 Chronic atrial fibrillation: Secondary | ICD-10-CM | POA: Diagnosis not present

## 2017-05-25 DIAGNOSIS — Z7951 Long term (current) use of inhaled steroids: Secondary | ICD-10-CM | POA: Diagnosis not present

## 2017-05-25 DIAGNOSIS — I130 Hypertensive heart and chronic kidney disease with heart failure and stage 1 through stage 4 chronic kidney disease, or unspecified chronic kidney disease: Secondary | ICD-10-CM | POA: Diagnosis not present

## 2017-05-25 DIAGNOSIS — Z7982 Long term (current) use of aspirin: Secondary | ICD-10-CM | POA: Diagnosis not present

## 2017-05-25 DIAGNOSIS — Z8672 Personal history of thrombophlebitis: Secondary | ICD-10-CM | POA: Insufficient documentation

## 2017-05-25 DIAGNOSIS — Z7901 Long term (current) use of anticoagulants: Secondary | ICD-10-CM | POA: Insufficient documentation

## 2017-05-25 DIAGNOSIS — Z953 Presence of xenogenic heart valve: Secondary | ICD-10-CM | POA: Insufficient documentation

## 2017-05-25 DIAGNOSIS — Z85820 Personal history of malignant melanoma of skin: Secondary | ICD-10-CM | POA: Diagnosis not present

## 2017-05-25 DIAGNOSIS — N183 Chronic kidney disease, stage 3 (moderate): Secondary | ICD-10-CM | POA: Diagnosis not present

## 2017-05-25 DIAGNOSIS — Z86718 Personal history of other venous thrombosis and embolism: Secondary | ICD-10-CM | POA: Insufficient documentation

## 2017-05-25 DIAGNOSIS — E785 Hyperlipidemia, unspecified: Secondary | ICD-10-CM | POA: Insufficient documentation

## 2017-05-25 DIAGNOSIS — H409 Unspecified glaucoma: Secondary | ICD-10-CM | POA: Diagnosis not present

## 2017-05-25 DIAGNOSIS — I509 Heart failure, unspecified: Secondary | ICD-10-CM

## 2017-05-25 DIAGNOSIS — I5022 Chronic systolic (congestive) heart failure: Secondary | ICD-10-CM | POA: Insufficient documentation

## 2017-05-25 DIAGNOSIS — Z8673 Personal history of transient ischemic attack (TIA), and cerebral infarction without residual deficits: Secondary | ICD-10-CM | POA: Diagnosis not present

## 2017-05-25 DIAGNOSIS — I428 Other cardiomyopathies: Secondary | ICD-10-CM | POA: Insufficient documentation

## 2017-05-25 DIAGNOSIS — Z95810 Presence of automatic (implantable) cardiac defibrillator: Secondary | ICD-10-CM | POA: Insufficient documentation

## 2017-05-25 DIAGNOSIS — Z87891 Personal history of nicotine dependence: Secondary | ICD-10-CM | POA: Diagnosis not present

## 2017-05-25 DIAGNOSIS — Z79899 Other long term (current) drug therapy: Secondary | ICD-10-CM | POA: Diagnosis not present

## 2017-05-25 DIAGNOSIS — I2510 Atherosclerotic heart disease of native coronary artery without angina pectoris: Secondary | ICD-10-CM | POA: Diagnosis not present

## 2017-05-25 NOTE — Progress Notes (Signed)
Daily Session Note  Patient Details  Name: Elizabeth Hall MRN: [id number] Date of Birth: 1940-07-20 Referring Provider:     Pulmonary Rehab from 04/20/2017 in [facility] Cardiac and Pulmonary Rehab  Referring Provider  [name] MD      Encounter Date: 05/25/2017  Check In: Session Check In - 05/25/17 1016      Check-In   Location  [facility]Cardiac & Pulmonary Re[facility]    Staff Present  Nada Maclachlan, BA, ACSM CEP, Exercise Physiologist;Kelly Amedeo Plenty, BS, ACSM CEP, Exercise Physiologist;Lashara Urey Flavia Shipper    Supervising physician immediately available to respond to emergencies  [name] immediately available ER MD    Physician(s)  Dr. Jimmye Norman and Corky Downs    Medication changes reported      No    Fall or balance concerns reported     No    Warm-up and Cool-down  Performed as group-led instruction    Resistance Training Performed  Yes    VAD Patient?  No      Pain Assessment   Currently in Pain?  No/denies          Social History   Tobacco Use  Smoking Status Former Smoker  . Packs/day: 0.50  . Years: 20.00  . Pack years: 10.00  . Types: Cigarettes  . Last attempt to quit: 02/04/1990  . Years since quitting: 27.3  Smokeless Tobacco Never Used    Goals Met:  Independence with exercise equipment Exercise tolerated well No report of cardiac concerns or symptoms Strength training completed today  Goals Unmet:  Not Applicable  Comments: Pt able to follow exercise prescription today without complaint.  Will continue to monitor for progression.   Dr. Emily Filbert is Medical Director for [place] and [name].

## 2017-05-27 DIAGNOSIS — I509 Heart failure, unspecified: Secondary | ICD-10-CM

## 2017-05-27 DIAGNOSIS — Z7951 Long term (current) use of inhaled steroids: Secondary | ICD-10-CM | POA: Diagnosis not present

## 2017-05-27 DIAGNOSIS — Z79899 Other long term (current) drug therapy: Secondary | ICD-10-CM | POA: Diagnosis not present

## 2017-05-27 DIAGNOSIS — I130 Hypertensive heart and chronic kidney disease with heart failure and stage 1 through stage 4 chronic kidney disease, or unspecified chronic kidney disease: Secondary | ICD-10-CM | POA: Diagnosis not present

## 2017-05-27 DIAGNOSIS — N183 Chronic kidney disease, stage 3 (moderate): Secondary | ICD-10-CM | POA: Diagnosis not present

## 2017-05-27 DIAGNOSIS — I5022 Chronic systolic (congestive) heart failure: Secondary | ICD-10-CM | POA: Diagnosis not present

## 2017-05-27 DIAGNOSIS — Z7982 Long term (current) use of aspirin: Secondary | ICD-10-CM | POA: Diagnosis not present

## 2017-05-27 NOTE — Progress Notes (Signed)
Daily Session Note  Patient Details  Name: Elizabeth Hall MRN: [id number] Date of Birth: 1941-03-24 Referring Provider:     Pulmonary Rehab from 04/20/2017 in [facility] Cardiac and Pulmonary Rehab  Referring Provider  [name] MD      Encounter Date: 05/27/2017  Check In: Session Check In - 05/27/17 1016      Check-In   Location  [facility]Cardiac & Pulmonary Rehab    Staff Present  Nada Maclachlan, BA, ACSM CEP, Exercise Physiologist;Joseph Darrin Nipper, [place], ACSM RCEP, Exercise Physiologist    Supervising physician immediately available to respond to emergencies  [name] immediately available ER MD    Physician(s)  Dr. Joni Fears and Archie Balboa    Medication changes reported      No    Fall or balance concerns reported     No    Warm-up and Cool-down  Performed as group-led instruction    Resistance Training Performed  Yes    VAD Patient?  No      Pain Assessment   Currently in Pain?  No/denies          Social History   Tobacco Use  Smoking Status Former Smoker  . Packs/day: 0.50  . Years: 20.00  . Pack years: 10.00  . Types: Cigarettes  . Last attempt to quit: 02/04/1990  . Years since quitting: 27.3  Smokeless Tobacco Never Used    Goals Met:  Independence with exercise equipment Exercise tolerated well No report of cardiac concerns or symptoms Strength training completed today  Goals Unmet:  Not Applicable  Comments: Pt able to follow exercise prescription today without complaint.  Will continue to monitor for progression.   Dr. Emily Filbert is Medical Director for [place] and [name].

## 2017-05-29 ENCOUNTER — Telehealth: Payer: Self-pay

## 2017-05-29 NOTE — Telephone Encounter (Signed)
Elizabeth Hall called to say she will not be attending [name] due to her husband having an appointment.

## 2017-06-01 LAB — CUP PACEART REMOTE DEVICE CHECK
Date Time Interrogation Session: 20181210110405
Implantable Lead Implant Date: 20150316
Implantable Lead Location: 753858
Implantable Lead Location: 753860
Implantable Lead Model: 346
MDC IDC LEAD IMPLANT DT: 20150316
MDC IDC LEAD SERIAL: 25130583
MDC IDC PG IMPLANT DT: 20150316
MDC IDC PG SERIAL: 60765179
Pulse Gen Model: 383547

## 2017-06-02 ENCOUNTER — Other Ambulatory Visit: Payer: Self-pay | Admitting: Cardiovascular Disease

## 2017-06-03 ENCOUNTER — Telehealth: Payer: Self-pay

## 2017-06-03 ENCOUNTER — Other Ambulatory Visit: Payer: Self-pay

## 2017-06-03 NOTE — Telephone Encounter (Signed)
Bethena Roys will not be able to attend [name] due to her husband having a procedure this morning.

## 2017-06-03 NOTE — Telephone Encounter (Signed)
Error

## 2017-06-05 DIAGNOSIS — I130 Hypertensive heart and chronic kidney disease with heart failure and stage 1 through stage 4 chronic kidney disease, or unspecified chronic kidney disease: Secondary | ICD-10-CM | POA: Diagnosis not present

## 2017-06-05 DIAGNOSIS — Z7951 Long term (current) use of inhaled steroids: Secondary | ICD-10-CM | POA: Diagnosis not present

## 2017-06-05 DIAGNOSIS — N183 Chronic kidney disease, stage 3 (moderate): Secondary | ICD-10-CM | POA: Diagnosis not present

## 2017-06-05 DIAGNOSIS — Z79899 Other long term (current) drug therapy: Secondary | ICD-10-CM | POA: Diagnosis not present

## 2017-06-05 DIAGNOSIS — Z7982 Long term (current) use of aspirin: Secondary | ICD-10-CM | POA: Diagnosis not present

## 2017-06-05 DIAGNOSIS — I509 Heart failure, unspecified: Secondary | ICD-10-CM

## 2017-06-05 DIAGNOSIS — I5022 Chronic systolic (congestive) heart failure: Secondary | ICD-10-CM | POA: Diagnosis not present

## 2017-06-05 NOTE — Progress Notes (Signed)
Daily Session Note  Patient Details  Name: Elizabeth Hall MRN: [id number] Date of Birth: 08-Nov-1940 Referring Provider:     Pulmonary Rehab from 04/20/2017 in [facility] Cardiac and Pulmonary Rehab  Referring Provider  Elizabeth Border MD      Encounter Date: 06/05/2017  Check In: Session Check In - 06/05/17 1013      Check-In   Location  [facility]-Cardiac & Pulmonary Rehab    Staff Present  Elizabeth Hall, [place], ACSM RCEP, Exercise Physiologist;Elizabeth Sherryll Burger, RN BSN    Supervising physician immediately available to respond to emergencies  Elizabeth Hall immediately available ER MD    Physician(s)  Dr. Jimmye Hall and Elizabeth Hall    Medication changes reported      No    Fall or balance concerns reported     No    Warm-up and Cool-down  Performed as group-led instruction    Resistance Training Performed  Yes    VAD Patient?  No      Pain Assessment   Currently in Pain?  No/denies          Social History   Tobacco Use  Smoking Status Former Smoker  . Packs/day: 0.50  . Years: 20.00  . Pack years: 10.00  . Types: Cigarettes  . Last attempt to quit: 02/04/1990  . Years since quitting: 27.3  Smokeless Tobacco Never Used    Goals Met:  Independence with exercise equipment Exercise tolerated well No report of cardiac concerns or symptoms Strength training completed today  Goals Unmet:  Not Applicable  Comments: Pt able to follow exercise prescription today without complaint.  Will continue to monitor for progression.   Elizabeth Hall is Medical Director for [facility].

## 2017-06-08 ENCOUNTER — Ambulatory Visit (INDEPENDENT_AMBULATORY_CARE_PROVIDER_SITE_OTHER): Payer: Medicare Other

## 2017-06-08 DIAGNOSIS — I130 Hypertensive heart and chronic kidney disease with heart failure and stage 1 through stage 4 chronic kidney disease, or unspecified chronic kidney disease: Secondary | ICD-10-CM | POA: Diagnosis not present

## 2017-06-08 DIAGNOSIS — I509 Heart failure, unspecified: Secondary | ICD-10-CM

## 2017-06-08 DIAGNOSIS — I4891 Unspecified atrial fibrillation: Secondary | ICD-10-CM

## 2017-06-08 DIAGNOSIS — Z7982 Long term (current) use of aspirin: Secondary | ICD-10-CM | POA: Diagnosis not present

## 2017-06-08 DIAGNOSIS — Z5181 Encounter for therapeutic drug level monitoring: Secondary | ICD-10-CM

## 2017-06-08 DIAGNOSIS — I482 Chronic atrial fibrillation, unspecified: Secondary | ICD-10-CM

## 2017-06-08 DIAGNOSIS — Z7951 Long term (current) use of inhaled steroids: Secondary | ICD-10-CM | POA: Diagnosis not present

## 2017-06-08 DIAGNOSIS — N183 Chronic kidney disease, stage 3 (moderate): Secondary | ICD-10-CM | POA: Diagnosis not present

## 2017-06-08 DIAGNOSIS — I4821 Permanent atrial fibrillation: Secondary | ICD-10-CM

## 2017-06-08 DIAGNOSIS — I5022 Chronic systolic (congestive) heart failure: Secondary | ICD-10-CM | POA: Diagnosis not present

## 2017-06-08 DIAGNOSIS — Z953 Presence of xenogenic heart valve: Secondary | ICD-10-CM | POA: Diagnosis not present

## 2017-06-08 DIAGNOSIS — Z79899 Other long term (current) drug therapy: Secondary | ICD-10-CM | POA: Diagnosis not present

## 2017-06-08 LAB — POCT INR
INR: 3.2
INR: 3.2

## 2017-06-08 NOTE — Patient Instructions (Signed)
Please have a large serving of greens today, take a 1/2 tablet tomorrow, then resume dosage of 1 tablet daily except 1/2 tablet on Mondays, Wednesdays and Fridays.   Recheck in 3 weeks.

## 2017-06-08 NOTE — Progress Notes (Signed)
Pulmonary Individual Treatment Plan  Patient Details  Name: Elizabeth Hall MRN: [id number] Date of Birth: August 26, 1940 Referring Provider:     Pulmonary Rehab from 04/20/2017 in [facility] Cardiac and Pulmonary Rehab  Referring Provider  [name] MD      Initial Encounter Date:    Pulmonary Rehab from 04/20/2017 in [facility] Cardiac and Pulmonary Rehab  Date  04/20/17  Referring Provider  [name] MD      Visit Diagnosis: Chronic congestive heart failure, unspecified heart failure type [facility])  Patient's Home Medications on Admission:  Current Outpatient Medications:  .  acetaminophen (TYLENOL) 500 MG tablet, Take 500-1,000 mg by mouth every 6 (six) hours as needed (for headaches/pain.)., Disp: , Rfl:  .  aspirin EC 81 MG tablet, Take 81 mg by mouth daily., Disp: , Rfl:  .  bumetanide (BUMEX) 2 MG tablet, TAKE 1 TABLET(2 MG) BY MOUTH DAILY, Disp: 90 tablet, Rfl: 3 .  bumetanide (BUMEX) 2 MG tablet, TAKE 1 TABLET (2 MG TOTAL) BY MOUTH DAILY., Disp: 90 tablet, Rfl: 1 .  carvedilol (COREG) 12.5 MG tablet, Take 1 tablet (12.5 mg total) by mouth 2 (two) times daily with a meal., Disp: 180 tablet, Rfl: 3 .  estradiol (ESTRACE) 1 MG tablet, Take 0.5 mg by mouth 2 (two) times a week. , Disp: , Rfl:  .  latanoprost (XALATAN) 0.005 % ophthalmic solution, Place 1 drop into both eyes at bedtime., Disp: , Rfl: 99 .  Magnesium 250 MG TABS, Take 250 mg by mouth daily., Disp: , Rfl:  .  meclizine (ANTIVERT) 25 MG tablet, Take 1 tablet (25 mg total) by mouth 2 (two) times daily as needed for dizziness., Disp: 180 tablet, Rfl: 0 .  Polyethyl Glycol-Propyl Glycol (LUBRICANT EYE DROPS) 0.4-0.3 % SOLN, Place 1 drop into both eyes 3 (three) times daily as needed (for dry eyes.)., Disp: , Rfl:  .  sacubitril-valsartan (ENTRESTO) 24-26 MG, Take 1 tablet 2 (two) times daily by mouth., Disp: 180 tablet, Rfl: 3 .  spironolactone (ALDACTONE) 25 MG tablet, TAKE 1/2 TABLET(12.5 MG) BY MOUTH DAILY, Disp: 30  tablet, Rfl: 3 .  vitamin E 400 UNIT capsule, Take 400 Units by mouth daily., Disp: , Rfl:  .  warfarin (COUMADIN) 3 MG tablet, Take as directed by [facility], Disp: 90 tablet, Rfl: 1  Past Medical History: Past Medical History:  Diagnosis Date  . Bleeding of eye   . Chronic systolic heart failure ([place])    a. 11/2016 Echo: EF 35-40%, mild MR w/ nl fxning bioprosthesis, mod dil LA.  . CKD (chronic kidney disease) stage 3, GFR 30-59 ml/min (HCC)    baseline Cr 1.5-2; prior saw nephrologist thought hypertensive nephropathy/renovascular disease  . CVA (cerebral infarction)    a. 2014 occipital lobe - some vision loss when coumadin held and not bridged  . Dyslipidemia   . Glaucoma   . H. pylori infection    treated  . H/O mitral valve replacement    a. 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  .Marland KitchenHx of rheumatic fever   . Hypertension   . Melanoma ([place] 2006   L shoulder  . 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 08/2013 s/p Biotronik Ilesto 7 HF1 BiV ICD (ser# [id number];  b. 11/2016 Echo: EF 35-40%.  . Non-obstructive CAD    a. s/p MI 2001;  b. 12/2016 Cath: LM 20ost, OM2 60, RCA 30p/d, EF 35-45%. Nl R heart filling pressures.  . Permanent atrial fibrillation (  [place])    a. 04/2012 s/p AVN & PPM placement (later upgraded to BiV ICD).  [name] Phlebitis   . 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 06/2012 h/o acute ischemia due to thromboembolism radial and ulnar arteries when coumadin held s/p atherectomy - needs bridging if off coumadin  . Tricuspid regurgitation    a. 05/2012 s/p TV annuloplasty @ time of MVR.    Tobacco Use: Social History   Tobacco Use  Smoking Status Former Smoker  . Packs/day: 0.50  . Years: 20.00  . Pack years: 10.00  . Types: Cigarettes  . Last attempt to quit: 02/04/1990  . Years since quitting: 27.3  Smokeless Tobacco Never Used    Labs: Recent Chemical engineer    Labs for ITP Cardiac and Pulmonary Rehab  Latest Ref Rng & Units 06/12/2014 06/27/2015 07/07/2016   Cholestrol 0 - 200 mg/dL 192 160 -   LDLCALC 0 - 99 mg/dL 104 96 -   HDL >39.00 mg/dL 74(A) 51.60 -   Trlycerides 0.0 - 149.0 mg/dL 70 62.0 -   Hemoglobin A1c 4.6 - 6.5 % - 6.2 6.1       Pulmonary Assessment Scores: Pulmonary Assessment Scores    Row Name 04/20/17 1453         ADL UCSD   ADL Phase  Entry     SOB Score total  42     Rest  0     Walk  1     Stairs  4     Bath  1     Dress  2     Shop  3       CAT Score   CAT Score  12       mMRC Score   mMRC Score  3        Pulmonary Function Assessment: Pulmonary Function Assessment - 04/20/17 1457      Breath   Bilateral Breath Sounds  Clear    Shortness of Breath  No;Limiting activity Her shortness of breath make it more of an annoyance for her       Exercise Target Goals:    Exercise Program Goal: Individual exercise prescription set with THRR, safety & activity barriers. Participant demonstrates ability to understand and report RPE using BORG scale, to self-measure pulse accurately, and to acknowledge the importance of the exercise prescription.  Exercise Prescription Goal: Starting with aerobic activity 30 plus minutes a day, 3 days per week for initial exercise prescription. Provide home exercise prescription and guidelines that participant acknowledges understanding prior to discharge.  Activity Barriers & Risk Stratification: Activity Barriers & Cardiac Risk Stratification - 04/20/17 1555      Activity Barriers & Cardiac Risk Stratification   Activity Barriers  Muscular Weakness;Shortness of Breath;Deconditioning;Balance Concerns;Decreased Ventricular Function       6 Minute Walk: 6 Minute Walk    Row Name 04/20/17 1553         6 Minute Walk   Phase  Initial     Distance  1260 feet     Walk Time  6 minutes     # of Rest Breaks  0     MPH  2.39     METS  2.6     RPE  11     Perceived Dyspnea   1.5     VO2 Peak  9.09     Symptoms   No     Resting HR  74 bpm  Resting BP  122/70     Resting Oxygen Saturation   98 %     Exercise Oxygen Saturation  during 6 min walk  93 %     Max Ex. HR  106 bpm     Max Ex. BP  126/60     2 Minute Post BP  122/64       Interval HR   1 Minute HR  104     2 Minute HR  101     3 Minute HR  91     4 Minute HR  86     5 Minute HR  98     6 Minute HR  106     2 Minute Post HR  76     Interval Heart Rate?  Yes       Interval Oxygen   Interval Oxygen?  Yes     Baseline Oxygen Saturation %  98 %     1 Minute Oxygen Saturation %  94 %     1 Minute Liters of Oxygen  0 L Room Air     2 Minute Oxygen Saturation %  94 %     2 Minute Liters of Oxygen  0 L     3 Minute Oxygen Saturation %  95 %     3 Minute Liters of Oxygen  0 L     4 Minute Oxygen Saturation %  94 %     4 Minute Liters of Oxygen  0 L     5 Minute Oxygen Saturation %  94 %     5 Minute Liters of Oxygen  0 L     6 Minute Oxygen Saturation %  93 %     6 Minute Liters of Oxygen  0 L     2 Minute Post Oxygen Saturation %  100 %     2 Minute Post Liters of Oxygen  0 L       Oxygen Initial Assessment: Oxygen Initial Assessment - 04/20/17 1504      Home Oxygen   Home Oxygen Device  None    Sleep Oxygen Prescription  None    Home Exercise Oxygen Prescription  None    Home at Rest Exercise Oxygen Prescription  None      Initial 6 min Walk   Oxygen Used  None      Program Oxygen Prescription   Program Oxygen Prescription  None      Intervention   Short Term Goals  To learn and understand importance of maintaining oxygen saturations>88%;To learn and demonstrate proper pursed lip breathing techniques or other breathing techniques.;To learn and understand importance of monitoring SPO2 with pulse oximeter and demonstrate accurate use of the pulse oximeter.    [place]obo  Term Goals  Exhibits proper breathing techniques, such as pursed lip breathing or other method taught during program session;Verbalizes importance of  monitoring SPO2 with pulse oximeter and return demonstration;Maintenance of O2 saturations>88%       Oxygen Re-Evaluation: Oxygen Re-Evaluation    Row Name 04/27/17 1028 05/18/17 1655           Program Oxygen Prescription   Program Oxygen Prescription  None  None        Home Oxygen   Home Oxygen Device  None  None      Sleep Oxygen Prescription  None  None      Home Exercise Oxygen Prescription  None  None  Home at Rest Exercise Oxygen Prescription  None  None        Goals/Expected Outcomes   Short Term Goals  To learn and understand importance of maintaining oxygen saturations>88%;To learn and demonstrate proper pursed lip breathing techniques or other breathing techniques.;To learn and understand importance of monitoring SPO2 with pulse oximeter and demonstrate accurate use of the pulse oximeter.  To learn and understand importance of maintaining oxygen saturations>88%;To learn and demonstrate proper pursed lip breathing techniques or other breathing techniques.;To learn and understand importance of monitoring SPO2 with pulse oximeter and demonstrate accurate use of the pulse oximeter.      [place]  Term Goals  Exhibits proper breathing techniques, such as pursed lip breathing or other method taught during program session;Maintenance of O2 saturations>88%;Verbalizes importance of monitoring SPO2 with pulse oximeter and return demonstration  Exhibits proper breathing techniques, such as pursed lip breathing or other method taught during program session;Maintenance of O2 saturations>88%;Verbalizes importance of monitoring SPO2 with pulse oximeter and return demonstration      Comments  Reviewed PLB technique with pt.  Talked about how it work and it's important to maintaining his exercise saturations.    Bethena Roys does not wear oxygen at home and does not wear a CPAP. She does not use inhalers or nebulizers. Bethena Roys needs to work on her PLB when she gets short of breath. She has a pulse oximeter at  home and checks her saturations. Bethena Roys states she stays around 97-98 percent.      Goals/Expected Outcomes  Short: Become more profiecient at using PLB.   [place]: Become independent at using PLB.  Short: decrease WOB. [place]: be proficient with PLB to decrease WOB          Oxygen Discharge (Final Oxygen Re-Evaluation): Oxygen Re-Evaluation - 05/18/17 1655      Program Oxygen Prescription   Program Oxygen Prescription  None      Home Oxygen   Home Oxygen Device  None    Sleep Oxygen Prescription  None    Home Exercise Oxygen Prescription  None    Home at Rest Exercise Oxygen Prescription  None      Goals/Expected Outcomes   Short Term Goals  To learn and understand importance of maintaining oxygen saturations>88%;To learn and demonstrate proper pursed lip breathing techniques or other breathing techniques.;To learn and understand importance of monitoring SPO2 with pulse oximeter and demonstrate accurate use of the pulse oximeter.    [place]  Term Goals  Exhibits proper breathing techniques, such as pursed lip breathing or other method taught during program session;Maintenance of O2 saturations>88%;Verbalizes importance of monitoring SPO2 with pulse oximeter and return demonstration    Comments  Bethena Roys does not wear oxygen at home and does not wear a CPAP. She does not use inhalers or nebulizers. Bethena Roys needs to work on her PLB when she gets short of breath. She has a pulse oximeter at home and checks her saturations. Bethena Roys states she stays around 97-98 percent.    Goals/Expected Outcomes  Short: decrease WOB. [place]: be proficient with PLB to decrease WOB        Initial Exercise Prescription: Initial Exercise Prescription - 04/20/17 1500      Date of Initial Exercise RX and Referring Provider   Date  04/20/17    Referring Provider  [name] MD      Treadmill   MPH  2.1    Grade  0    Minutes  15    METs  2.61  NuStep   Level  2    SPM  80    Minutes  15    METs  2.6      REL-XR    Level  1    Speed  50    Minutes  15    METs  2.6      Prescription Details   Frequency (times per week)  3    Duration  Progress to 45 minutes of aerobic exercise without signs/symptoms of physical distress      Intensity   THRR 40-80% of Max Heartrate  103-132    Ratings of Perceived Exertion  11-13    Perceived Dyspnea  0-4      Progression   Progression  Continue to progress workloads to maintain intensity without signs/symptoms of physical distress.      Resistance Training   Training Prescription  Yes    Weight  3 lbs    Reps  10-15       Perform Capillary Blood Glucose checks as needed.  Exercise Prescription Changes: Exercise Prescription Changes    Row Name 04/20/17 1500 04/29/17 1200 05/01/17 1000 05/13/17 1000 05/27/17 1200     Response to Exercise   Blood Pressure (Admit)  122/70  126/64  -  108/62  126/70   Blood Pressure (Exercise)  126/60  -  -  134/64  -   Blood Pressure (Exit)  122/64  114/56  -  102/60  122/74   Heart Rate (Admit)  74 bpm  77 bpm  -  98 bpm  77 bpm   Heart Rate (Exercise)  106 bpm  100 bpm  -  106 bpm  122 bpm   Heart Rate (Exit)  76 bpm  75 bpm  -  78 bpm  73 bpm   Oxygen Saturation (Admit)  98 %  98 %  -  100 %  98 %   Oxygen Saturation (Exercise)  93 %  99 %  -  100 %  97 %   Oxygen Saturation (Exit)  100 %  99 %  -  100 %  97 %   Rating of Perceived Exertion (Exercise)  11  10  -  13  14   Perceived Dyspnea (Exercise)  1.5  1  -  1  2   Symptoms  none  none  -  none  none   Comments  walk test results  -  -  -  -   Duration  -  Progress to 45 minutes of aerobic exercise without signs/symptoms of physical distress  -  Progress to 45 minutes of aerobic exercise without signs/symptoms of physical distress  Continue with 45 min of aerobic exercise without signs/symptoms of physical distress.   Intensity  -  THRR unchanged  -  THRR unchanged  THRR unchanged     Progression   Progression  -  Continue to progress workloads to maintain  intensity without signs/symptoms of physical distress.  -  Continue to progress workloads to maintain intensity without signs/symptoms of physical distress.  Continue to progress workloads to maintain intensity without signs/symptoms of physical distress.   Average METs  -  3.3  -  3.4  2.7     Resistance Training   Training Prescription  -  Yes  -  Yes  Yes   Weight  -  3 lbs  -  3 lbs  3 lb   Reps  -  10-15  -  10-15  10-15     Interval Training   Interval Training  -  No  -  No  -     Treadmill   MPH  -  -  -  -  3   Grade  -  -  -  -  0   Minutes  -  -  -  -  15   METs  -  -  -  -  3.3     NuStep   Level  -  2  -  4  -   SPM  -  91  -  86  -   Minutes  -  15  -  15  -   METs  -  2.7  -  3.2  -     Arm Ergometer   Level  -  -  -  -  1.4   Minutes  -  -  -  -  15   METs  -  -  -  -  2     REL-XR   Level  -  1  -  1  -   Speed  -  50  -  54  -   Minutes  -  15  -  15  -   METs  -  3.9  -  3.6  -     Home Exercise Plan   Plans to continue exercise at  -  -  Home (comment) walking at home.  Home (comment) walking at home.  Home (comment) walking at home.   Frequency  -  -  Add 2 additional days to program exercise sessions.  Add 2 additional days to program exercise sessions.  Add 2 additional days to program exercise sessions.   Initial Home Exercises Provided  -  -  05/01/17  05/01/17  05/01/17      Exercise Comments: Exercise Comments    Row Name 04/27/17 1028 05/01/17 1101         Exercise Comments  First full day of exercise!  Patient was oriented to gym and equipment including functions, settings, policies, and procedures.  Patient's individual exercise prescription and treatment plan were reviewed.  All starting workloads were established based on the results of the 6 minute walk test done at initial orientation visit.  The plan for exercise progression was also introduced and progression will be customized based on patient's performance and goals.  Reviewed home  exercise with pt today.  Pt plans to walk 2 extra days a week at home for exercise.  Reviewed THR, pulse, RPE, sign and symptoms, NTG use, and when to call 911 or MD.  Also discussed weather considerations and indoor options.  Pt voiced understanding         Exercise Goals and Review: Exercise Goals    Row Name 04/20/17 1558             Exercise Goals   Increase Physical Activity  Yes       Intervention  Provide advice, education, support and counseling about physical activity/exercise needs.;Develop an individualized exercise prescription for aerobic and resistive training based on initial evaluation findings, risk stratification, comorbidities and participant's personal goals.       Expected Outcomes  Achievement of increased cardiorespiratory fitness and enhanced flexibility, muscular endurance and strength shown through measurements of functional capacity and personal statement of participant.       Increase Strength and Stamina  Yes  Intervention  Provide advice, education, support and counseling about physical activity/exercise needs.;Develop an individualized exercise prescription for aerobic and resistive training based on initial evaluation findings, risk stratification, comorbidities and participant's personal goals.       Expected Outcomes  Achievement of increased cardiorespiratory fitness and enhanced flexibility, muscular endurance and strength shown through measurements of functional capacity and personal statement of participant.       Able to understand and use rate of perceived exertion (RPE) scale  Yes       Intervention  Provide education and explanation on how to use RPE scale       Expected Outcomes  Short Term: Able to use RPE daily in rehab to express subjective intensity level;[place] Term:  Able to use RPE to guide intensity level when exercising independently       Able to understand and use Dyspnea scale  Yes       Intervention  Provide education and explanation  on how to use Dyspnea scale       Expected Outcomes  Short Term: Able to use Dyspnea scale daily in rehab to express subjective sense of shortness of breath during exertion;[place] Term: Able to use Dyspnea scale to guide intensity level when exercising independently       Knowledge and understanding of Target Heart Rate Range (THRR)  Yes       Intervention  Provide education and explanation of THRR including how the numbers were predicted and where they are located for reference       Expected Outcomes  Short Term: Able to state/look up THRR;[place] Term: Able to use THRR to govern intensity when exercising independently;Short Term: Able to use daily as guideline for intensity in rehab       Able to check pulse independently  Yes       Intervention  Provide education and demonstration on how to check pulse in carotid and radial arteries.;Review the importance of being able to check your own pulse for safety during independent exercise       Expected Outcomes  Short Term: Able to explain why pulse checking is important during independent exercise;[place] Term: Able to check pulse independently and accurately       Understanding of Exercise Prescription  Yes       Intervention  Provide education, explanation, and written materials on patient's individual exercise prescription       Expected Outcomes  Short Term: Able to explain program exercise prescription;[place] Term: Able to explain home exercise prescription to exercise independently          Exercise Goals Re-Evaluation : Exercise Goals Re-Evaluation    Row Name 04/27/17 1028 04/29/17 1245 05/01/17 1056 05/13/17 1049 05/27/17 1225     Exercise Goal Re-Evaluation   Exercise Goals Review  Understanding of Exercise Prescription;Able to understand and use Dyspnea scale;Knowledge and understanding of Target Heart Rate Range (THRR);Able to understand and use rate of perceived exertion (RPE) scale  Increase Physical Activity;Increase Strength and Stamina;Able  to understand and use Dyspnea scale;Able to understand and use rate of perceived exertion (RPE) scale  Increase Physical Activity;Increase Strength and Stamina  Increase Physical Activity;Increase Strength and Stamina  Increase Physical Activity;Increase Strength and Stamina   Comments  Reviewed RPE scale, THR and program prescription with pt today.  Pt voiced understanding and was given a copy of goals to take home.   Bethena Roys has tolerated exrecise well in her first two sessions.  Staff will continue to monitor for progress.  Reviewed home exercise with pt today.  Pt plans to walk 2 extra days a week at home for exercise.  Reviewed THR, pulse, RPE, sign and symptoms, NTG use, and when to call 911 or MD.  Also discussed weather considerations and indoor options.  Pt voiced understanding  Bethena Roys is tolerating exercise well.  She has missed some sessions due to taking care of her husband.  She plans to return after Thanksgiving.  Bethena Roys has increased her walk speed.  Staff will monitor progress.   Expected Outcomes  Short: Use RPE daily to regulate intensity.  [place]: Follow program prescription in THR.  Short - Bethena Roys will attend class regularly.  [place] - Bethena Roys will increase overall MET level.  Short: add 2 extra days of walking at home. [place]: add extra days of exercising.  Short - Bethena Roys will return and attend regularly.  [place] - Bethena Roys will increase overall fitness level.  Short - Bethena Roys will build on current levels.  [place] - Bethena Roys will maintain fitness level on her own.      Discharge Exercise Prescription (Final Exercise Prescription Changes): Exercise Prescription Changes - 05/27/17 1200      Response to Exercise   Blood Pressure (Admit)  126/70    Blood Pressure (Exit)  122/74    Heart Rate (Admit)  77 bpm    Heart Rate (Exercise)  122 bpm    Heart Rate (Exit)  73 bpm    Oxygen Saturation (Admit)  98 %    Oxygen Saturation (Exercise)  97 %    Oxygen Saturation (Exit)  97 %    Rating of Perceived Exertion (Exercise)   14    Perceived Dyspnea (Exercise)  2    Symptoms  none    Duration  Continue with 45 min of aerobic exercise without signs/symptoms of physical distress.    Intensity  THRR unchanged      Progression   Progression  Continue to progress workloads to maintain intensity without signs/symptoms of physical distress.    Average METs  2.7      Resistance Training   Training Prescription  Yes    Weight  3 lb    Reps  10-15      Treadmill   MPH  3    Grade  0    Minutes  15    METs  3.3      Arm Ergometer   Level  1.4    Minutes  15    METs  2      Home Exercise Plan   Plans to continue exercise at  Home (comment) walking at home.    Frequency  Add 2 additional days to program exercise sessions.    Initial Home Exercises Provided  05/01/17       Nutrition:  Target Goals: Understanding of nutrition guidelines, daily intake of sodium <1563m, cholesterol <2044m calories 30% from fat and 7% or less from saturated fats, daily to have 5 or more servings of fruits and vegetables.  Biometrics: Pre Biometrics - 04/20/17 1558      Pre Biometrics   Height  5' 3.5" (1.613 m)    Weight  153 lb 11.2 oz (69.7 kg)    Waist Circumference  34 inches    Hip Circumference  39.75 inches    Waist to Hip Ratio  0.86 %    BMI (Calculated)  26.8        Nutrition Therapy Plan and Nutrition Goals: Nutrition Therapy & Goals - 04/20/17  [place] Nutrition Goals   Nutrition Goal  Eat healthier and lose weight.    Comments  She would like to see the dietician      Intervention Plan   Intervention  Prescribe, educate and counsel regarding individualized specific dietary modifications aiming towards targeted core components such as weight, hypertension, lipid management, diabetes, heart failure and other comorbidities.;Nutrition handout(s) given to patient.    Expected Outcomes  Short Term Goal: Understand basic principles of dietary content, such as calories, fat, sodium, cholesterol  and nutrients.;Short Term Goal: A plan has been developed with personal nutrition goals set during dietitian appointment.;Guess Term Goal: Adherence to prescribed nutrition plan.       Nutrition Discharge: Rate Your Plate Scores: Nutrition Assessments - 04/20/17 1600      MEDFICTS Scores   Pre Score  35       Nutrition Goals Re-Evaluation: Nutrition Goals Re-Evaluation    [place] Name 05/06/17 1109 05/20/17 1150           Goals   Current Weight  151 lb 1.6 oz (68.5 kg)  -      Nutrition Goal  Eat healthier and lose weight.  -      Comment  Bethena Roys has made an appointment to see the dietician.  Scheduled to meet with RD 12/10      Expected Outcome  Short: meet with dietician. [place]: Adhere to a diet plan.  Short - meet with RD  [place] - follow recommendations from RD         Nutrition Goals Discharge (Final Nutrition Goals Re-Evaluation): Nutrition Goals Re-Evaluation - 05/20/17 1150      Goals   Comment  Scheduled to meet with RD 12/10    Expected Outcome  Short - meet with RD  [place] - follow recommendations from RD       Psychosocial: Target Goals: Acknowledge presence or absence of significant depression and/or stress, maximize coping skills, provide positive support system. Participant is able to verbalize types and ability to use techniques and skills needed for reducing stress and depression.   Initial Review & Psychosocial Screening: Initial Psych Review & Screening - 04/20/17 1446      Initial Review   Current issues with  Current Sleep Concerns;Current Stress Concerns    Source of Stress Concerns  Family    Comments  Husband has cancer, he was diagnosed five years ago and is on IV chemo.       Family Dynamics   Good Support System?  Yes    Comments  Patient has one daughter and she lives in [place]. She has one grandson and two step grandkids.      Barriers   Psychosocial barriers to participate in program  The patient should benefit from training in stress  management and relaxation.      Screening Interventions   Interventions  Yes;Encouraged to exercise;Program counselor consult;Provide feedback about the scores to participant;To provide support and resources with identified psychosocial needs    Expected Outcomes  Short Term goal: Utilizing psychosocial counselor, staff and physician to assist with identification of specific Stressors or current issues interfering with healing process. Setting desired goal for each stressor or current issue identified.;[place] Term Goal: Stressors or current issues are controlled or eliminated.;Short Term goal: Identification and review with participant of any Quality of Life or Depression concerns found by scoring the questionnaire.;[place] Term goal: The participant improves quality of Life and PHQ9 Scores as seen by post  scores and/or verbalization of changes       Quality of Life Scores:   PHQ-9: Recent Review Flowsheet Data    Depression screen [facility] 2/9 04/20/2017 07/07/2016 07/02/2015   Decreased Interest 0 0 0   Down, Depressed, Hopeless 0 0 0   PHQ - 2 Score 0 0 0   Altered sleeping 1 - -   Tired, decreased energy 1 - -   Change in appetite 1 - -   Feeling bad or failure about yourself  0 - -   Trouble concentrating 0 - -   Moving slowly or fidgety/restless 0 - -   Suicidal thoughts 0 - -   PHQ-9 Score 3 - -   Difficult doing work/chores Not difficult at all - -     Interpretation of Total Score  Total Score Depression Severity:  1-4 = Minimal depression, 5-9 = Mild depression, 10-14 = Moderate depression, 15-19 = Moderately severe depression, 20-27 = Severe depression   Psychosocial Evaluation and Intervention: Psychosocial Evaluation - 04/27/17 1123      Psychosocial Evaluation & Interventions   Interventions  Encouraged to exercise with the program and follow exercise prescription;Stress management education;Relaxation education    Comments  Counselor met with Ms. [place]ing[place] Bethena Roys) today for initial  psychosocial evaluation.  She is a 76 year old who has been experiencing shortness of breath recently.  She has a strong support system with a spouse of 38+ years; an adult daughter and son in law who live close by and friends and active involvement in her local church.  Bethena Roys has had multiple health isues with open heart surgery in 2013 and did a Cardiac Rehab program following that in [place], [place] where she was residing at the time.  Bethena Roys states she sleeps fairly well with 7-8 hours of sleep and her appetite is good as well.  She denies a history of depression or anxiety and is typically in a positive mood currently.  She has multiple stressors with her spouse's health issues (which are improving) and her own - while she works part time.  There was some recent stress with a financial scam last year that has been addressed and is improving now.  Judy's goals are to breathe better and lose some weight while in this program.  She has gym equipment at her apartment complex that she will use and supplement when her doctor releases her to do so.  Staff will follow with Bethena Roys throughout the course of this program.      Expected Outcomes  Bethena Roys will benefit from consistent exercise to achieve her stated goals.  She will also be meeting with the dietician to address her weight loss goal.  The educational and psychoeducational components of this program will help Bethena Roys understand and learn to cope better with her condition and with life stressors.  Staff will follow with her.      Continue Psychosocial Services   Follow up required by staff       Psychosocial Re-Evaluation: Psychosocial Re-Evaluation    [place] Name 05/20/17 1058 05/20/17 1153           Psychosocial Re-Evaluation   Current issues with  Current Stress Concerns  Current Stress Concerns      Comments  Counselor follow up with Bethena Roys reporting already seeing some progress since coming into this program - with less shortness of breath.  She has been out  several weeks due to her husband being in the hospital and that has been  stressful, but she reports he is somewhat better and she is relieved.  Counselor commended Bethena Roys on her progress made and commitment to exercise.    Judy's current stress involves helping her spouse who has multiple myeloma.  He has recently had unexplained fever and has been referred to an Infectious disease speciallist.  They both work part time and she enjoys her work and it is not stressful for her.      Expected Outcomes  Bethena Roys will continue to exercise consistently for her own health and for managing stress in her life.    Short - Bethena Roys will continue to practice self care so she can better help her spouse.  [place] - Bethena Roys will manage stress through exercising and self care.      Interventions  Stress management education  Encouraged to attend Pulmonary Rehabilitation for the exercise      Continue Psychosocial Services   Follow up required by staff  Follow up required by staff         Psychosocial Discharge (Final Psychosocial Re-Evaluation): Psychosocial Re-Evaluation - 05/20/17 1153      Psychosocial Re-Evaluation   Current issues with  Current Stress Concerns    Comments  Judy's current stress involves helping her spouse who has multiple myeloma.  He has recently had unexplained fever and has been referred to an Infectious disease speciallist.  They both work part time and she enjoys her work and it is not stressful for her.    Expected Outcomes  Short - Bethena Roys will continue to practice self care so she can better help her spouse.  [place] - Bethena Roys will manage stress through exercising and self care.    Interventions  Encouraged to attend Pulmonary Rehabilitation for the exercise    Continue Psychosocial Services   Follow up required by staff       Education: Education Goals: Education classes will be provided on a weekly basis, covering required topics. Participant will state understanding/return demonstration of topics  presented.  Learning Barriers/Preferences: Learning Barriers/Preferences - 04/20/17 1459      Learning Barriers/Preferences   Learning Barriers  Sight wears glasses    Learning Preferences  None       Education Topics: Initial Evaluation Education: - Verbal, written and demonstration of respiratory meds, RPE/PD scales, oximetry and breathing techniques. Instruction on use of nebulizers and MDIs: cleaning and proper use, rinsing mouth with steroid doses and importance of monitoring MDI activations.   Pulmonary Rehab from 05/27/2017 in [facility] Cardiac and Pulmonary Rehab  Date  04/20/17  Educator  [facility]  Instruction Review Code  1- Verbalizes Understanding      General Nutrition Guidelines/Fats and Fiber: -Group instruction provided by verbal, written material, models and posters to present the general guidelines for heart healthy nutrition. Gives an explanation and review of dietary fats and fiber.   Controlling Sodium/Reading Food Labels: -Group verbal and written material supporting the discussion of sodium use in heart healthy nutrition. Review and explanation with models, verbal and written materials for utilization of the food label.   Exercise Physiology & Risk Factors: - Group verbal and written instruction with models to review the exercise physiology of the cardiovascular system and associated critical values. Details cardiovascular disease risk factors and the goals associated with each risk factor.   Aerobic Exercise & Resistance Training: - Gives group verbal and written discussion on the health impact of inactivity. On the components of aerobic and resistive training programs and the benefits of this training and how  to safely progress through these programs.   Flexibility, Balance, General Exercise Guidelines: - Provides group verbal and written instruction on the benefits of flexibility and balance training programs. Provides general exercise guidelines with specific  guidelines to those with heart or lung disease. Demonstration and skill practice provided.   Stress Management: - Provides group verbal and written instruction about the health risks of elevated stress, cause of high stress, and healthy ways to reduce stress.   Depression: - Provides group verbal and written instruction on the correlation between heart/lung disease and depressed mood, treatment options, and the stigmas associated with seeking treatment.   Pulmonary Rehab from 05/27/2017 in [facility] Cardiac and Pulmonary Rehab  Date  05/06/17  Educator  [place]  Instruction Review Code  1- Verbalizes Understanding      Exercise & Equipment Safety: - Individual verbal instruction and demonstration of equipment use and safety with use of the equipment.   Pulmonary Rehab from 05/27/2017 in [facility] Cardiac and Pulmonary Rehab  Date  04/20/17  Educator  [facility]  Instruction Review Code  1- Verbalizes Understanding      Infection Prevention: - Provides verbal and written material to individual with discussion of infection control including proper hand washing and proper equipment cleaning during exercise session.   Pulmonary Rehab from 05/27/2017 in [facility] Cardiac and Pulmonary Rehab  Date  04/20/17  Educator  [facility]  Instruction Review Code  1- Verbalizes Understanding      Falls Prevention: - Provides verbal and written material to individual with discussion of falls prevention and safety.   Pulmonary Rehab from 05/27/2017 in [facility] Cardiac and Pulmonary Rehab  Date  04/20/17  Educator  [facility]  Instruction Review Code  1- Verbalizes Understanding      Diabetes: - Individual verbal and written instruction to review signs/symptoms of diabetes, desired ranges of glucose level fasting, after meals and with exercise. Advice that pre and post exercise glucose checks will be done for 3 sessions at entry of program.   Chronic Lung Diseases: - Group verbal and written instruction to review new updates, new  respiratory medications, new advancements in procedures and treatments. Provide informative websites and "800" numbers of self-education.   Lung Procedures: - Group verbal and written instruction to describe testing methods done to diagnose lung disease. Review the outcome of test results. Describe the treatment choices: Pulmonary Function Tests, ABGs and oximetry.   Energy Conservation: - Provide group verbal and written instruction for methods to conserve energy, plan and organize activities. Instruct on pacing techniques, use of adaptive equipment and posture/positioning to relieve shortness of breath.   Pulmonary Rehab from 05/27/2017 in [facility] Cardiac and Pulmonary Rehab  Date  05/20/17  Educator  [facility]  Instruction Review Code  1- Verbalizes Understanding      Triggers: - Group verbal and written instruction to review types of environmental controls: home humidity, furnaces, filters, dust mite/pet prevention, HEPA vacuums. To discuss weather changes, air quality and the benefits of nasal washing.   Pulmonary Rehab from 05/27/2017 in [facility] Cardiac and Pulmonary Rehab  Date  04/29/17  Educator  [facility]  Instruction Review Code  1- Verbalizes Understanding      Exacerbations: - Group verbal and written instruction to provide: warning signs, infection symptoms, calling MD promptly, preventive modes, and value of vaccinations. Review: effective airway clearance, coughing and/or vibration techniques. Create an Sports administrator.   Pulmonary Rehab from 05/27/2017 in [facility] Cardiac and Pulmonary Rehab  Date  04/29/17  Educator  [facility]  Instruction  Review Code  1- Verbalizes Understanding      Oxygen: - Individual and group verbal and written instruction on oxygen therapy. Includes supplement oxygen, available portable oxygen systems, continuous and intermittent flow rates, oxygen safety, concentrators, and Medicare reimbursement for oxygen.   Pulmonary Rehab from 05/27/2017 in [facility] Cardiac and Pulmonary Rehab   Date  04/20/17  Educator  [facility]  Instruction Review Code  1- Verbalizes Understanding      Respiratory Medications: - Group verbal and written instruction to review medications for lung disease. Drug class, frequency, complications, importance of spacers, rinsing mouth after steroid MDI's, and proper cleaning methods for nebulizers.   Pulmonary Rehab from 05/27/2017 in [facility] Cardiac and Pulmonary Rehab  Date  04/20/17  Educator  [facility]  Instruction Review Code  1- Verbalizes Understanding      AED/CPR: - Group verbal and written instruction with the use of models to demonstrate the basic use of the AED with the basic ABC's of resuscitation.   Breathing Retraining: - Provides individuals verbal and written instruction on purpose, frequency, and proper technique of diaphragmatic breathing and pursed-lipped breathing. Applies individual practice skills.   Pulmonary Rehab from 05/27/2017 in [facility] Cardiac and Pulmonary Rehab  Date  04/20/17  Educator  [facility]  Instruction Review Code  1- Verbalizes Understanding      Anatomy and Physiology of the Lungs: - Group verbal and written instruction with the use of models to provide basic lung anatomy and physiology related to function, structure and complications of lung disease.   Pulmonary Rehab from 05/27/2017 in [facility] Cardiac and Pulmonary Rehab  Date  05/27/17  Educator  [facility]  Instruction Review Code  1- Verbalizes Understanding      Anatomy & Physiology of the Heart: - Group verbal and written instruction and models provide basic cardiac anatomy and physiology, with the coronary electrical and arterial systems. Review of: AMI, Angina, Valve disease, Heart Failure, Cardiac Arrhythmia, Pacemakers, and the ICD.   Heart Failure: - Group verbal and written instruction on the basics of heart failure: signs/symptoms, treatments, explanation of ejection fraction, enlarged heart and cardiomyopathy.   Sleep Apnea: - Individual verbal and written  instruction to review Obstructive Sleep Apnea. Review of risk factors, methods for diagnosing and types of masks and machines for OSA.   Pulmonary Rehab from 05/27/2017 in [facility] Cardiac and Pulmonary Rehab  Date  04/29/17  Educator  [facility]  Instruction Review Code  1- Verbalizes Understanding      Anxiety: - Provides group, verbal and written instruction on the correlation between heart/lung disease and anxiety, treatment options, and management of anxiety.   Relaxation: - Provides group, verbal and written instruction about the benefits of relaxation for patients with heart/lung disease. Also provides patients with examples of relaxation techniques.   Cardiac Medications: - Group verbal and written instruction to review commonly prescribed medications for heart disease. Reviews the medication, class of the drug, and side effects.   Know Your Numbers: -Group verbal and written instruction about important numbers in your health.  Review of Cholesterol, Blood Pressure, Diabetes, and BMI and the role they play in your overall health.   Other: -Provides group and verbal instruction on various topics (see comments)    Knowledge Questionnaire Score: Knowledge Questionnaire Score - 04/20/17 1459      Knowledge Questionnaire Score   Pre Score  9/10 Reviewed with patient        Core Components/Risk Factors/Patient Goals at Admission: Personal Goals and Risk Factors at Admission -  04/20/17 1505      Core Components/Risk Factors/Patient Goals on Admission    Weight Management  Yes;Weight Loss    Intervention  Weight Management: Develop a combined nutrition and exercise program designed to reach desired caloric intake, while maintaining appropriate intake of nutrient and fiber, sodium and fats, and appropriate energy expenditure required for the weight goal.;Weight Management: Provide education and appropriate resources to help participant work on and attain dietary goals.;Weight  Management/Obesity: Establish reasonable short term and [place] term weight goals.    Admit Weight  153 lb 11.2 oz (69.7 kg)    Goal Weight: Short Term  148 lb (67.1 kg)    Goal Weight: Bruhn Term  135 lb (61.2 kg)    Expected Outcomes  [place] Term: Adherence to nutrition and physical activity/exercise program aimed toward attainment of established weight goal;Weight Maintenance: Understanding of the daily nutrition guidelines, which includes 25-35% calories from fat, 7% or less cal from saturated fats, less than 211m cholesterol, less than 1.5gm of sodium, & 5 or more servings of fruits and vegetables daily;Short Term: Continue to assess and modify interventions until short term weight is achieved;Weight Loss: Understanding of general recommendations for a balanced deficit meal plan, which promotes 1-2 lb weight loss per week and includes a negative energy balance of [id number] kcal/d;Understanding recommendations for meals to include 15-35% energy as protein, 25-35% energy from fat, 35-60% energy from carbohydrates, less than 2070mof dietary cholesterol, 20-35 gm of total fiber daily;Understanding of distribution of calorie intake throughout the day with the consumption of 4-5 meals/snacks    Improve shortness of breath with ADL's  Yes    Intervention  Provide education, individualized exercise plan and daily activity instruction to help decrease symptoms of SOB with activities of daily living.    Expected Outcomes  Short Term: Achieves a reduction of symptoms when performing activities of daily living.    Heart Failure  Yes    Intervention  Provide a combined exercise and nutrition program that is supplemented with education, support and counseling about heart failure. Directed toward relieving symptoms such as shortness of breath, decreased exercise tolerance, and extremity edema.    Expected Outcomes  Improve functional capacity of life;Short term: Attendance in program 2-3 days a week with increased  exercise capacity. Reported lower sodium intake. Reported increased fruit and vegetable intake. Reports medication compliance.;Short term: Daily weights obtained and reported for increase. Utilizing diuretic protocols set by physician.;Battie term: Adoption of self-care skills and reduction of barriers for early signs and symptoms recognition and intervention leading to self-care maintenance.    Hypertension  Yes    Intervention  Provide education on lifestyle modifcations including regular physical activity/exercise, weight management, moderate sodium restriction and increased consumption of fresh fruit, vegetables, and low fat dairy, alcohol moderation, and smoking cessation.;Monitor prescription use compliance.    Expected Outcomes  [place] Term: Maintenance of blood pressure at goal levels.;Short Term: Continued assessment and intervention until BP is < 140/9076mG in hypertensive participants. < 130/95m85m in hypertensive participants with diabetes, heart failure or chronic kidney disease.    Stress  Yes    Intervention  Offer individual and/or small group education and counseling on adjustment to heart disease, stress management and health-related lifestyle change. Teach and support self-help strategies.;Refer participants experiencing significant psychosocial distress to appropriate mental health specialists for further evaluation and treatment. When possible, include family members and significant others in education/counseling sessions.    Expected Outcomes  Short Term: Participant demonstrates changes in  health-related behavior, relaxation and other stress management skills, ability to obtain effective social support, and compliance with psychotropic medications if prescribed.;[place] Term: Emotional wellbeing is indicated by absence of clinically significant psychosocial distress or social isolation.       Core Components/Risk Factors/Patient Goals Review:  Goals and Risk Factor Review    Row Name  05/20/17 1150             Core Components/Risk Factors/Patient Goals Review   Personal Goals Review  Weight Management/Obesity;Hypertension;Stress;Improve shortness of breath with ADL's       Review  Bethena Roys is scheduled to meet with RD 12/10.  She states she is not as short of breath when walking and carrying.  She is not getting a lot of exrecise at home.  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  Her stress is from his illness and ongoing complications.       Expected Outcomes  Short - Bethena Roys will meet with RD and staff will review home exercise with her.  [place] - Bethena Roys will begin more healthy eating and add exercise at home.          Core Components/Risk Factors/Patient Goals at Discharge (Final Review):  Goals and Risk Factor Review - 05/20/17 1150      Core Components/Risk Factors/Patient Goals Review   Personal Goals Review  Weight Management/Obesity;Hypertension;Stress;Improve shortness of breath with ADL's    Review  Bethena Roys is scheduled to meet with RD 12/10.  She states she is not as short of breath when walking and carrying.  She is not getting a lot of exrecise at home.  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  Her stress is from his illness and ongoing complications.    Expected Outcomes  Short - Bethena Roys will meet with RD and staff will review home exercise with her.  [place] - Bethena Roys will begin more healthy eating and add exercise at home.       ITP Comments: ITP Comments    Row Name 04/20/17 1427 04/27/17 1121 05/07/17 1447 05/11/17 0825 06/08/17 0816   ITP Comments  Medical Evaluation completed. Chart sent for review and changes to Dr. Emily Filbert Director of [place]. Visit Diagnosis can be found in [facility] encounter 04/20/17  Spent 10 minutes with patient reviewing PLB.  Bethena Roys stopped by to say she will be back after Thanksgiving due to her husbands health.  She will return Monday 11/26.  30 day review completed. ITP sent to Dr. Emily Filbert Director of [place].  Continue with ITP unless changes are made by physician.    30 day review completed. ITP sent to Dr. Emily Filbert Director of [place]. Continue with ITP unless changes are made by physician.        Comments: 30 day review

## 2017-06-08 NOTE — Progress Notes (Signed)
Daily Session Note  Patient Details  Name: Elizabeth Hall MRN: [id number] Date of Birth: 12-Jul-1940 Referring Provider:     Pulmonary Rehab from 04/20/2017 in [facility] Cardiac and Pulmonary Rehab  Referring Provider  [name] MD      Encounter Date: 06/08/2017  Check In: Session Check In - 06/08/17 1018      Check-In   Location  [facility]-Cardiac & Pulmonary Rehab    Staff Present  Justin Mend RCP,RRT,BSRT;Amanda Oletta Darter, BA, ACSM CEP, Exercise Physiologist;Kelly Amedeo Plenty, BS, ACSM CEP, Exercise Physiologist    Supervising physician immediately available to respond to emergencies  [name] immediately available ER MD    Physician(s)  Dr. Kerman Passey and Owens Shark    Medication changes reported      No    Fall or balance concerns reported     No    Warm-up and Cool-down  Performed on first and last piece of equipment    Resistance Training Performed  Yes    VAD Patient?  No      Pain Assessment   Currently in Pain?  No/denies          Social History   Tobacco Use  Smoking Status Former Smoker  . Packs/day: 0.50  . Years: 20.00  . Pack years: 10.00  . Types: Cigarettes  . Last attempt to quit: 02/04/1990  . Years since quitting: 27.3  Smokeless Tobacco Never Used    Goals Met:  Independence with exercise equipment Exercise tolerated well No report of cardiac concerns or symptoms Strength training completed today  Goals Unmet:  Not Applicable  Comments: Pt able to follow exercise prescription today without complaint.  Will continue to monitor for progression.   Dr. Emily Filbert is Medical Director for [place] and [name].

## 2017-06-10 DIAGNOSIS — Z7982 Long term (current) use of aspirin: Secondary | ICD-10-CM | POA: Diagnosis not present

## 2017-06-10 DIAGNOSIS — I130 Hypertensive heart and chronic kidney disease with heart failure and stage 1 through stage 4 chronic kidney disease, or unspecified chronic kidney disease: Secondary | ICD-10-CM | POA: Diagnosis not present

## 2017-06-10 DIAGNOSIS — Z79899 Other long term (current) drug therapy: Secondary | ICD-10-CM | POA: Diagnosis not present

## 2017-06-10 DIAGNOSIS — I509 Heart failure, unspecified: Secondary | ICD-10-CM

## 2017-06-10 DIAGNOSIS — N183 Chronic kidney disease, stage 3 (moderate): Secondary | ICD-10-CM | POA: Diagnosis not present

## 2017-06-10 DIAGNOSIS — I5022 Chronic systolic (congestive) heart failure: Secondary | ICD-10-CM | POA: Diagnosis not present

## 2017-06-10 DIAGNOSIS — Z7951 Long term (current) use of inhaled steroids: Secondary | ICD-10-CM | POA: Diagnosis not present

## 2017-06-10 NOTE — Progress Notes (Signed)
Daily Session Note  Patient Details  Name: Elizabeth Hall MRN: [id number] Date of Birth: August 02, 1940 Referring Provider:     Pulmonary Rehab from 04/20/2017 in [facility] Cardiac and Pulmonary Rehab  Referring Provider  [name] MD      Encounter Date: 06/10/2017  Check In: Session Check In - 06/10/17 1016      Check-In   Location  [facility]Cardiac [facility] Pulmonary Rehab    Staff Present  Justin Mend Lorre Nick, MA, ACSM RCEP, Exercise Physiologist;Amanda Oletta Darter, [place], ACSM CEP, Exercise Physiologist    Supervising physician immediately available to respond to emergencies  [name] immediately available ER MD    Physician(s)  Dr. Jimmye Norman and Archie Balboa    Medication changes reported      No    Fall or balance concerns reported     No    Warm-up and Cool-down  Performed as group-led instruction    Resistance Training Performed  Yes    VAD Patient?  No      Pain Assessment   Currently in Pain?  No/denies          Social History   Tobacco Use  Smoking Status Former Smoker  . Packs/day: 0.50  . Years: 20.00  . Pack years: 10.00  . Types: Cigarettes  . Last attempt to quit: 02/04/1990  . Years since quitting: 27.3  Smokeless Tobacco Never Used    Goals Met:  Independence with exercise equipment Exercise tolerated well No report of cardiac concerns or symptoms Strength training completed today  Goals Unmet:  Not Applicable  Comments: Pt able to follow exercise prescription today without complaint.  Will continue to monitor for progression.   Dr. Emily Filbert is Medical Director for [place] and [name].

## 2017-06-12 ENCOUNTER — Encounter: Payer: Medicare Other | Admitting: *Deleted

## 2017-06-12 DIAGNOSIS — I5022 Chronic systolic (congestive) heart failure: Secondary | ICD-10-CM | POA: Diagnosis not present

## 2017-06-12 DIAGNOSIS — Z7951 Long term (current) use of inhaled steroids: Secondary | ICD-10-CM | POA: Diagnosis not present

## 2017-06-12 DIAGNOSIS — Z7982 Long term (current) use of aspirin: Secondary | ICD-10-CM | POA: Diagnosis not present

## 2017-06-12 DIAGNOSIS — I130 Hypertensive heart and chronic kidney disease with heart failure and stage 1 through stage 4 chronic kidney disease, or unspecified chronic kidney disease: Secondary | ICD-10-CM | POA: Diagnosis not present

## 2017-06-12 DIAGNOSIS — Z79899 Other long term (current) drug therapy: Secondary | ICD-10-CM | POA: Diagnosis not present

## 2017-06-12 DIAGNOSIS — N183 Chronic kidney disease, stage 3 (moderate): Secondary | ICD-10-CM | POA: Diagnosis not present

## 2017-06-12 DIAGNOSIS — I509 Heart failure, unspecified: Secondary | ICD-10-CM

## 2017-06-12 NOTE — Progress Notes (Signed)
Daily Session Note  Patient Details  Name: Elizabeth Hall MRN: [id number] Date of Birth: 1940-07-04 Referring Provider:     Pulmonary Rehab from 04/20/2017 in [facility] Cardiac and Pulmonary Rehab  Referring Provider  [name] MD      Encounter Date: 06/12/2017  Check In: Session Check In - 06/12/17 1029      Check-In   Location  [facility]Cardiac & Pulmonary Rehab    Staff Present  Renita Papa, RN Vickki Hearing, BA, ACSM CEP, Exercise Physiologist;Other [place] physician immediately available to respond to emergencies  [name] immediately available ER MD    Physician(s)  Dr. Cinda Quest and Corky Downs    Medication changes reported      No    Fall or balance concerns reported     No    Warm-up and Cool-down  Performed as group-led instruction    Resistance Training Performed  Yes    VAD Patient?  No      Pain Assessment   Currently in Pain?  No/denies          Social History   Tobacco Use  Smoking Status Former Smoker  . Packs/day: 0.50  . Years: 20.00  . Pack years: 10.00  . Types: Cigarettes  . Last attempt to quit: 02/04/1990  . Years since quitting: 27.3  Smokeless Tobacco Never Used    Goals Met:  Proper associated with RPD/PD & O2 Sat Independence with exercise equipment Using PLB without cueing & demonstrates good technique Exercise tolerated well Strength training completed today  Goals Unmet:  Not Applicable  Comments: Pt able to follow exercise prescription today without complaint.  Will continue to monitor for progression.    Dr. Emily Filbert is Medical Director for [place] and [name].

## 2017-06-15 ENCOUNTER — Encounter: Payer: Medicare Other | Admitting: *Deleted

## 2017-06-15 DIAGNOSIS — Z7982 Long term (current) use of aspirin: Secondary | ICD-10-CM | POA: Diagnosis not present

## 2017-06-15 DIAGNOSIS — Z79899 Other long term (current) drug therapy: Secondary | ICD-10-CM | POA: Diagnosis not present

## 2017-06-15 DIAGNOSIS — Z7951 Long term (current) use of inhaled steroids: Secondary | ICD-10-CM | POA: Diagnosis not present

## 2017-06-15 DIAGNOSIS — I130 Hypertensive heart and chronic kidney disease with heart failure and stage 1 through stage 4 chronic kidney disease, or unspecified chronic kidney disease: Secondary | ICD-10-CM | POA: Diagnosis not present

## 2017-06-15 DIAGNOSIS — N183 Chronic kidney disease, stage 3 (moderate): Secondary | ICD-10-CM | POA: Diagnosis not present

## 2017-06-15 DIAGNOSIS — I509 Heart failure, unspecified: Secondary | ICD-10-CM

## 2017-06-15 DIAGNOSIS — I5022 Chronic systolic (congestive) heart failure: Secondary | ICD-10-CM | POA: Diagnosis not present

## 2017-06-15 NOTE — Progress Notes (Signed)
Daily Session Note  Patient Details  Name: Jeanetta Alonzo MRN: [id number] Date of Birth: [birth year] Referring Provider:     Pulmonary Rehab from 04/20/2017 in [facility] Cardiac and Pulmonary Rehab  Referring Provider  [name] MD      Encounter Date: 06/15/2017  Check In: Session Check In - 06/15/17 1032      Check-In   Location  [facility]Cardiac & Pulmonary Rehab    Staff Present  Nada Maclachlan, BA, ACSM CEP, Exercise Physiologist;Thersa Mohiuddin Luan Pulling, MA, ACSM RCEP, Exercise Physiologist    Supervising physician immediately available to respond to emergencies  [name] immediately available ER MD    Physician(s)  Drs. Schaevitz and Willaims    Medication changes reported      No    Fall or balance concerns reported     No    Warm-up and Cool-down  Performed as group-led Higher education careers adviser Performed  Yes    VAD Patient?  No      Pain Assessment   Currently in Pain?  No/denies          Social History   Tobacco Use  Smoking Status Former Smoker  . Packs/day: 0.50  . Years: 20.00  . Pack years: 10.00  . Types: Cigarettes  . Last attempt to quit: 02/04/1990  . Years since quitting: 27.3  Smokeless Tobacco Never Used    Goals Met:  Proper associated with RPD/PD & O2 Sat Independence with exercise equipment Using PLB without cueing & demonstrates good technique Exercise tolerated well Personal goals reviewed No report of cardiac concerns or symptoms Strength training completed today  Goals Unmet:  Not Applicable  Comments: Pt able to follow exercise prescription today without complaint.  Will continue to monitor for progression.  See ITP for goal review.   Dr. Emily Filbert is Medical Director for [place] and [name].

## 2017-06-17 ENCOUNTER — Encounter: Payer: Medicare Other | Admitting: *Deleted

## 2017-06-17 ENCOUNTER — Telehealth: Payer: Self-pay | Admitting: Cardiovascular Disease

## 2017-06-17 DIAGNOSIS — I5022 Chronic systolic (congestive) heart failure: Secondary | ICD-10-CM | POA: Diagnosis not present

## 2017-06-17 DIAGNOSIS — I509 Heart failure, unspecified: Secondary | ICD-10-CM

## 2017-06-17 DIAGNOSIS — Z79899 Other long term (current) drug therapy: Secondary | ICD-10-CM | POA: Diagnosis not present

## 2017-06-17 DIAGNOSIS — Z7982 Long term (current) use of aspirin: Secondary | ICD-10-CM | POA: Diagnosis not present

## 2017-06-17 DIAGNOSIS — Z7951 Long term (current) use of inhaled steroids: Secondary | ICD-10-CM | POA: Diagnosis not present

## 2017-06-17 DIAGNOSIS — N183 Chronic kidney disease, stage 3 (moderate): Secondary | ICD-10-CM | POA: Diagnosis not present

## 2017-06-17 DIAGNOSIS — I130 Hypertensive heart and chronic kidney disease with heart failure and stage 1 through stage 4 chronic kidney disease, or unspecified chronic kidney disease: Secondary | ICD-10-CM | POA: Diagnosis not present

## 2017-06-17 NOTE — Progress Notes (Signed)
Daily Session Note  Patient Details  Name: Tanny Harnack MRN: [id number] Date of Birth: 1941-05-01 Referring Provider:     Pulmonary Rehab from 04/20/2017 in [facility] Cardiac and Pulmonary Rehab  Referring Provider  [name] MD      Encounter Date: 06/17/2017  Check In: Session Check In - 06/17/17 1016      Check-In   Location  [facility]iac [facility] Pulmonary Re[facility]    Staff Present  Nada Maclachlan, BA, ACSM CEP, Exercise Physiologist;Other;Jessica Luan Pulling, MA, ACSM RCEP, Exercise Physiologist Joellyn Rued, BS, [place]    Supervising physician immediately available to respond to emergencies  [name] immediately available ER MD    Physician(s)  Dr. Jimmye Norman and [facility]    Medication changes reported      No    Fall or balance concerns reported     No    Tobacco Cessation  No Change    Warm-up and Cool-down  Performed as group-led instruction    Resistance Training Performed  Yes    VAD Patient?  No      Pain Assessment   Currently in Pain?  No/denies          Social History   Tobacco Use  Smoking Status Former Smoker  . Packs/day: 0.50  . Years: 20.00  . Pack years: 10.00  . Types: Cigarettes  . Last attempt to quit: 02/04/1990  . Years since quitting: 27.3  Smokeless Tobacco Never Used    Goals Met:  Proper associated with RPD/PD & O2 Sat Independence with exercise equipment Improved SOB with ADL's Using PLB without cueing & demonstrates good technique Personal goals reviewed No report of cardiac concerns or symptoms Strength training completed today  Goals Unmet:  Not Applicable  Comments: Pt able to follow exercise prescription today without complaint.  Will continue to monitor for progression.    Dr. Emily Filbert is Medical Director for [place] and [name].

## 2017-06-17 NOTE — Telephone Encounter (Signed)
Patient dropped off [employer] patient assistance forms to be completed Placed in Nurse Box

## 2017-06-17 NOTE — Telephone Encounter (Signed)
Completed paperwork with MD signature at front desk for pick up. Left message on pt's cell VM.

## 2017-06-17 NOTE — Telephone Encounter (Signed)
Paperwork completed.  °Awaiting MD signature.  °

## 2017-06-19 DIAGNOSIS — N183 Chronic kidney disease, stage 3 (moderate): Secondary | ICD-10-CM | POA: Diagnosis not present

## 2017-06-19 DIAGNOSIS — I509 Heart failure, unspecified: Secondary | ICD-10-CM

## 2017-06-19 DIAGNOSIS — I130 Hypertensive heart and chronic kidney disease with heart failure and stage 1 through stage 4 chronic kidney disease, or unspecified chronic kidney disease: Secondary | ICD-10-CM | POA: Diagnosis not present

## 2017-06-19 DIAGNOSIS — Z7982 Long term (current) use of aspirin: Secondary | ICD-10-CM | POA: Diagnosis not present

## 2017-06-19 DIAGNOSIS — Z79899 Other long term (current) drug therapy: Secondary | ICD-10-CM | POA: Diagnosis not present

## 2017-06-19 DIAGNOSIS — I5022 Chronic systolic (congestive) heart failure: Secondary | ICD-10-CM | POA: Diagnosis not present

## 2017-06-19 DIAGNOSIS — Z7951 Long term (current) use of inhaled steroids: Secondary | ICD-10-CM | POA: Diagnosis not present

## 2017-06-19 NOTE — Progress Notes (Signed)
Daily Session Note  Patient Details  Name: Elizabeth Hall MRN: [id number] Date of Birth: 02-06-1941 Referring Provider:     Pulmonary Rehab from 04/20/2017 in [facility] Cardiac and Pulmonary Rehab  Referring Provider  [name] MD      Encounter Date: 06/19/2017  Check In: Session Check In - 06/19/17 1022      Check-In   Location  [facility]Cardiac [facility] Pulmonary Re[facility]    Staff Present  Justin Mend RCP,RRT,BSRT;Meredith Sherryll Burger, RN BSN;Jessica Luan Pulling, MA, ACSM RCEP, Exercise Physiologist    Supervising physician immediately available to respond to emergencies  [name] immediately available ER MD    Physician(s)  Dr. Burlene Arnt and Cinda Quest    Medication changes reported      No    Fall or balance concerns reported     No    Warm-up and Cool-down  Performed as group-led instruction    Resistance Training Performed  Yes    VAD Patient?  No      Pain Assessment   Currently in Pain?  No/denies          Social History   Tobacco Use  Smoking Status Former Smoker  . Packs/day: 0.50  . Years: 20.00  . Pack years: 10.00  . Types: Cigarettes  . Last attempt to quit: 02/04/1990  . Years since quitting: 27.3  Smokeless Tobacco Never Used    Goals Met:  Independence with exercise equipment Exercise tolerated well No report of cardiac concerns or symptoms Strength training completed today  Goals Unmet:  Not Applicable  Comments: Pt able to follow exercise prescription today without complaint.  Will continue to monitor for progression.   Dr. Emily Filbert is Medical Director for [place] and [name].

## 2017-06-22 ENCOUNTER — Encounter: Payer: Medicare Other | Admitting: *Deleted

## 2017-06-22 ENCOUNTER — Encounter: Payer: Self-pay | Admitting: *Deleted

## 2017-06-22 DIAGNOSIS — Z79899 Other long term (current) drug therapy: Secondary | ICD-10-CM | POA: Diagnosis not present

## 2017-06-22 DIAGNOSIS — Z7951 Long term (current) use of inhaled steroids: Secondary | ICD-10-CM | POA: Diagnosis not present

## 2017-06-22 DIAGNOSIS — I5022 Chronic systolic (congestive) heart failure: Secondary | ICD-10-CM | POA: Diagnosis not present

## 2017-06-22 DIAGNOSIS — Z7982 Long term (current) use of aspirin: Secondary | ICD-10-CM | POA: Diagnosis not present

## 2017-06-22 DIAGNOSIS — I130 Hypertensive heart and chronic kidney disease with heart failure and stage 1 through stage 4 chronic kidney disease, or unspecified chronic kidney disease: Secondary | ICD-10-CM | POA: Diagnosis not present

## 2017-06-22 DIAGNOSIS — I509 Heart failure, unspecified: Secondary | ICD-10-CM

## 2017-06-22 DIAGNOSIS — N183 Chronic kidney disease, stage 3 (moderate): Secondary | ICD-10-CM | POA: Diagnosis not present

## 2017-06-22 DIAGNOSIS — H35032 Hypertensive retinopathy, left eye: Secondary | ICD-10-CM | POA: Diagnosis not present

## 2017-06-22 NOTE — Progress Notes (Signed)
Daily Session Note  Patient Details  Name: Elizabeth Hall MRN: [id number] Date of Birth: 03/26/1941 Referring Provider:     Pulmonary Rehab from 04/20/2017 in [facility] Cardiac and Pulmonary Rehab  Referring Provider  [name] MD      Encounter Date: 06/22/2017  Check In: Session Check In - 06/22/17 1145      Check-In   Location  [facility]Cardiac [facility] Pulmonary Rehab    Staff Present  Alberteen Sam, MA, ACSM RCEP, Exercise Physiologist;Arianah Torgeson, RN, BSN    Supervising physician immediately available to respond to emergencies  [name] immediately available ER MD    Physician(s)  Dr. Quentin Cornwall and Dr. Metro Kung    Medication changes reported      No    Fall or balance concerns reported     No    Tobacco Cessation  No Change    Warm-up and Cool-down  Performed as group-led instruction    Resistance Training Performed  Yes    VAD Patient?  No      Pain Assessment   Currently in Pain?  No/denies          Social History   Tobacco Use  Smoking Status Former Smoker  . Packs/day: 0.50  . Years: 20.00  . Pack years: 10.00  . Types: Cigarettes  . Last attempt to quit: 02/04/1990  . Years since quitting: 27.3  Smokeless Tobacco Never Used    Goals Met:  Proper associated with RPD/PD & O2 Sat Exercise tolerated well Strength training completed today  Goals Unmet:  Not Applicable  Comments:     Dr. Emily Filbert is Medical Director for [place] and [name].

## 2017-06-24 ENCOUNTER — Encounter: Payer: Medicare Other | Attending: Pulmonary Disease

## 2017-06-24 DIAGNOSIS — I5022 Chronic systolic (congestive) heart failure: Secondary | ICD-10-CM | POA: Insufficient documentation

## 2017-06-24 DIAGNOSIS — I2510 Atherosclerotic heart disease of native coronary artery without angina pectoris: Secondary | ICD-10-CM | POA: Insufficient documentation

## 2017-06-24 DIAGNOSIS — Z8672 Personal history of thrombophlebitis: Secondary | ICD-10-CM | POA: Insufficient documentation

## 2017-06-24 DIAGNOSIS — Z86718 Personal history of other venous thrombosis and embolism: Secondary | ICD-10-CM | POA: Insufficient documentation

## 2017-06-24 DIAGNOSIS — Z85820 Personal history of malignant melanoma of skin: Secondary | ICD-10-CM | POA: Insufficient documentation

## 2017-06-24 DIAGNOSIS — Z7982 Long term (current) use of aspirin: Secondary | ICD-10-CM | POA: Insufficient documentation

## 2017-06-24 DIAGNOSIS — Z8673 Personal history of transient ischemic attack (TIA), and cerebral infarction without residual deficits: Secondary | ICD-10-CM | POA: Insufficient documentation

## 2017-06-24 DIAGNOSIS — Z95810 Presence of automatic (implantable) cardiac defibrillator: Secondary | ICD-10-CM | POA: Insufficient documentation

## 2017-06-24 DIAGNOSIS — N183 Chronic kidney disease, stage 3 (moderate): Secondary | ICD-10-CM | POA: Insufficient documentation

## 2017-06-24 DIAGNOSIS — Z953 Presence of xenogenic heart valve: Secondary | ICD-10-CM | POA: Insufficient documentation

## 2017-06-24 DIAGNOSIS — H409 Unspecified glaucoma: Secondary | ICD-10-CM | POA: Insufficient documentation

## 2017-06-24 DIAGNOSIS — I482 Chronic atrial fibrillation: Secondary | ICD-10-CM | POA: Insufficient documentation

## 2017-06-24 DIAGNOSIS — Z87891 Personal history of nicotine dependence: Secondary | ICD-10-CM | POA: Insufficient documentation

## 2017-06-24 DIAGNOSIS — Z7951 Long term (current) use of inhaled steroids: Secondary | ICD-10-CM | POA: Insufficient documentation

## 2017-06-24 DIAGNOSIS — Z7901 Long term (current) use of anticoagulants: Secondary | ICD-10-CM | POA: Insufficient documentation

## 2017-06-24 DIAGNOSIS — I130 Hypertensive heart and chronic kidney disease with heart failure and stage 1 through stage 4 chronic kidney disease, or unspecified chronic kidney disease: Secondary | ICD-10-CM | POA: Insufficient documentation

## 2017-06-24 DIAGNOSIS — I428 Other cardiomyopathies: Secondary | ICD-10-CM | POA: Insufficient documentation

## 2017-06-24 DIAGNOSIS — E785 Hyperlipidemia, unspecified: Secondary | ICD-10-CM | POA: Insufficient documentation

## 2017-06-24 DIAGNOSIS — Z79899 Other long term (current) drug therapy: Secondary | ICD-10-CM | POA: Insufficient documentation

## 2017-06-26 DIAGNOSIS — H409 Unspecified glaucoma: Secondary | ICD-10-CM | POA: Diagnosis not present

## 2017-06-26 DIAGNOSIS — N183 Chronic kidney disease, stage 3 (moderate): Secondary | ICD-10-CM | POA: Diagnosis not present

## 2017-06-26 DIAGNOSIS — I482 Chronic atrial fibrillation: Secondary | ICD-10-CM | POA: Diagnosis not present

## 2017-06-26 DIAGNOSIS — I428 Other cardiomyopathies: Secondary | ICD-10-CM | POA: Diagnosis not present

## 2017-06-26 DIAGNOSIS — I130 Hypertensive heart and chronic kidney disease with heart failure and stage 1 through stage 4 chronic kidney disease, or unspecified chronic kidney disease: Secondary | ICD-10-CM | POA: Diagnosis not present

## 2017-06-26 DIAGNOSIS — Z7982 Long term (current) use of aspirin: Secondary | ICD-10-CM | POA: Diagnosis not present

## 2017-06-26 DIAGNOSIS — Z8673 Personal history of transient ischemic attack (TIA), and cerebral infarction without residual deficits: Secondary | ICD-10-CM | POA: Diagnosis not present

## 2017-06-26 DIAGNOSIS — Z953 Presence of xenogenic heart valve: Secondary | ICD-10-CM | POA: Diagnosis not present

## 2017-06-26 DIAGNOSIS — Z86718 Personal history of other venous thrombosis and embolism: Secondary | ICD-10-CM | POA: Diagnosis not present

## 2017-06-26 DIAGNOSIS — E785 Hyperlipidemia, unspecified: Secondary | ICD-10-CM | POA: Diagnosis not present

## 2017-06-26 DIAGNOSIS — Z7901 Long term (current) use of anticoagulants: Secondary | ICD-10-CM | POA: Diagnosis not present

## 2017-06-26 DIAGNOSIS — Z7951 Long term (current) use of inhaled steroids: Secondary | ICD-10-CM | POA: Diagnosis not present

## 2017-06-26 DIAGNOSIS — I5022 Chronic systolic (congestive) heart failure: Secondary | ICD-10-CM | POA: Diagnosis not present

## 2017-06-26 DIAGNOSIS — Z79899 Other long term (current) drug therapy: Secondary | ICD-10-CM | POA: Diagnosis not present

## 2017-06-26 DIAGNOSIS — I509 Heart failure, unspecified: Secondary | ICD-10-CM | POA: Diagnosis present

## 2017-06-26 DIAGNOSIS — Z87891 Personal history of nicotine dependence: Secondary | ICD-10-CM | POA: Diagnosis not present

## 2017-06-26 DIAGNOSIS — Z85820 Personal history of malignant melanoma of skin: Secondary | ICD-10-CM | POA: Diagnosis not present

## 2017-06-26 DIAGNOSIS — Z8672 Personal history of thrombophlebitis: Secondary | ICD-10-CM | POA: Diagnosis not present

## 2017-06-26 DIAGNOSIS — I2510 Atherosclerotic heart disease of native coronary artery without angina pectoris: Secondary | ICD-10-CM | POA: Diagnosis not present

## 2017-06-26 DIAGNOSIS — Z95810 Presence of automatic (implantable) cardiac defibrillator: Secondary | ICD-10-CM | POA: Diagnosis not present

## 2017-06-26 NOTE — Progress Notes (Signed)
Daily Session Note  Patient Details  Name: Elizabeth Hall MRN: [id number] Date of Birth: 06-May-1941 Referring Provider:     Pulmonary Rehab from 04/20/2017 in [facility] Cardiac and Pulmonary Rehab  Referring Provider  [name] MD      Encounter Date: 06/26/2017  Check In: Session Check In - 06/26/17 1016      Check-In   Location  [facility]Cardiac [facility] Pulmonary Rehab    Staff Present  Justin Mend Lindell Spar, BA, ACSM CEP, Exercise Physiologist;Meredith Sherryll Burger, RN BSN    Supervising physician immediately available to respond to emergencies  [name] immediately available ER MD    Physician(s)  Dr. Corky Downs and Clearnce Hasten    Medication changes reported      No    Fall or balance concerns reported     No    Warm-up and Cool-down  Performed as group-led instruction    Resistance Training Performed  Yes    VAD Patient?  No      Pain Assessment   Currently in Pain?  No/denies          Social History   Tobacco Use  Smoking Status Former Smoker  . Packs/day: 0.50  . Years: 20.00  . Pack years: 10.00  . Types: Cigarettes  . Last attempt to quit: 02/04/1990  . Years since quitting: 27.4  Smokeless Tobacco Never Used    Goals Met:  Independence with exercise equipment Exercise tolerated well No report of cardiac concerns or symptoms Strength training completed today  Goals Unmet:  Not Applicable  Comments: Pt able to follow exercise prescription today without complaint.  Will continue to monitor for progression.   Dr. Emily Filbert is Medical Director for [place] and [name].

## 2017-06-29 ENCOUNTER — Ambulatory Visit (INDEPENDENT_AMBULATORY_CARE_PROVIDER_SITE_OTHER): Payer: Medicare Other

## 2017-06-29 DIAGNOSIS — I482 Chronic atrial fibrillation, unspecified: Secondary | ICD-10-CM

## 2017-06-29 DIAGNOSIS — Z79899 Other long term (current) drug therapy: Secondary | ICD-10-CM | POA: Diagnosis not present

## 2017-06-29 DIAGNOSIS — Z7951 Long term (current) use of inhaled steroids: Secondary | ICD-10-CM | POA: Diagnosis not present

## 2017-06-29 DIAGNOSIS — Z953 Presence of xenogenic heart valve: Secondary | ICD-10-CM | POA: Diagnosis not present

## 2017-06-29 DIAGNOSIS — N183 Chronic kidney disease, stage 3 (moderate): Secondary | ICD-10-CM | POA: Diagnosis not present

## 2017-06-29 DIAGNOSIS — I509 Heart failure, unspecified: Secondary | ICD-10-CM

## 2017-06-29 DIAGNOSIS — I5022 Chronic systolic (congestive) heart failure: Secondary | ICD-10-CM | POA: Diagnosis not present

## 2017-06-29 DIAGNOSIS — Z7982 Long term (current) use of aspirin: Secondary | ICD-10-CM | POA: Diagnosis not present

## 2017-06-29 DIAGNOSIS — I130 Hypertensive heart and chronic kidney disease with heart failure and stage 1 through stage 4 chronic kidney disease, or unspecified chronic kidney disease: Secondary | ICD-10-CM | POA: Diagnosis not present

## 2017-06-29 DIAGNOSIS — Z5181 Encounter for therapeutic drug level monitoring: Secondary | ICD-10-CM

## 2017-06-29 DIAGNOSIS — I4891 Unspecified atrial fibrillation: Secondary | ICD-10-CM

## 2017-06-29 LAB — POCT INR: INR: 2.3

## 2017-06-29 NOTE — Progress Notes (Signed)
Daily Session Note  Patient Details  Name: Elizabeth Hall MRN: [id number] Date of Birth: 1941-02-25 Referring Provider:     Pulmonary Rehab from 04/20/2017 in [facility] Cardiac and Pulmonary Rehab  Referring Provider  [name] MD      Encounter Date: 06/29/2017  Check In: Session Check In - 06/29/17 1001      Check-In   Location  [facility]iac [facility] Pulmonary Re[facility]    Staff Present  Justin Mend RCP,RRT,BSRT;Amanda Oletta Darter, BA, ACSM CEP, Exercise Physiologist;Kelly Amedeo Plenty, BS, ACSM CEP, Exercise Physiologist    Supervising physician immediately available to respond to emergencies  [name] immediately available ER MD    Physician(s)  Dr. Kerman Passey and Jimmye Norman    Medication changes reported      No    Fall or balance concerns reported     No    Warm-up and Cool-down  Performed as group-led instruction    Resistance Training Performed  Yes    VAD Patient?  No      Pain Assessment   Currently in Pain?  No/denies          Social History   Tobacco Use  Smoking Status Former Smoker  . Packs/day: 0.50  . Years: 20.00  . Pack years: 10.00  . Types: Cigarettes  . Last attempt to quit: 02/04/1990  . Years since quitting: 27.4  Smokeless Tobacco Never Used    Goals Met:  Independence with exercise equipment Exercise tolerated well No report of cardiac concerns or symptoms Strength training completed today  Goals Unmet:  Not Applicable  Comments: Pt able to follow exercise prescription today without complaint.  Will continue to monitor for progression.   Dr. Emily Filbert is Medical Director for [place] and [name].

## 2017-06-29 NOTE — Patient Instructions (Signed)
Please take extra 1/2 tablet today, then resume dosage of 1 tablet daily except 1/2 tablet on Mondays, Wednesdays and Fridays.   Recheck in 3 weeks.

## 2017-06-30 ENCOUNTER — Telehealth: Payer: Self-pay | Admitting: *Deleted

## 2017-06-30 DIAGNOSIS — Z1231 Encounter for screening mammogram for malignant neoplasm of breast: Secondary | ICD-10-CM

## 2017-06-30 NOTE — Telephone Encounter (Signed)
-----   Message from Elizabeth Hall, Elizabeth Hall sent at 06/30/2017  9:58 AM EST ----- Regarding: order request Contact: [phone] Pt requesting order for mammogram & SONOGRAM because of dense breast tissue, pt of dr Kennon Rounds. The breast center is where she goes. Will call to schedule appointment once order is received. Please inform pt when she can call.

## 2017-07-01 DIAGNOSIS — I130 Hypertensive heart and chronic kidney disease with heart failure and stage 1 through stage 4 chronic kidney disease, or unspecified chronic kidney disease: Secondary | ICD-10-CM | POA: Diagnosis not present

## 2017-07-01 DIAGNOSIS — I509 Heart failure, unspecified: Secondary | ICD-10-CM

## 2017-07-01 DIAGNOSIS — Z79899 Other long term (current) drug therapy: Secondary | ICD-10-CM | POA: Diagnosis not present

## 2017-07-01 DIAGNOSIS — N183 Chronic kidney disease, stage 3 (moderate): Secondary | ICD-10-CM | POA: Diagnosis not present

## 2017-07-01 DIAGNOSIS — Z7982 Long term (current) use of aspirin: Secondary | ICD-10-CM | POA: Diagnosis not present

## 2017-07-01 DIAGNOSIS — I5022 Chronic systolic (congestive) heart failure: Secondary | ICD-10-CM | POA: Diagnosis not present

## 2017-07-01 DIAGNOSIS — Z7951 Long term (current) use of inhaled steroids: Secondary | ICD-10-CM | POA: Diagnosis not present

## 2017-07-01 NOTE — Progress Notes (Signed)
Daily Session Note  Patient Details  Name: Elizabeth Hall MRN: [id number] Date of Birth: 1940-11-27 Referring Provider:     Pulmonary Rehab from 04/20/2017 in [facility] Cardiac and Pulmonary Rehab  Referring Provider  [name] MD      Encounter Date: 07/01/2017  Check In: Session Check In - 07/01/17 1018      Check-In   Location  [facility]iac [facility]monary Re[facility]    Staff Present  Justin Mend Lorre Nick, MA, ACSM RCEP, Exercise Physiologist;Amanda Oletta Darter, [place], ACSM CEP, Exercise Physiologist    Supervising physician immediately available to respond to emergencies  [name] immediately available ER MD    Physician(s)  Dr. Quentin Cornwall and Jimmye Norman    Medication changes reported      No    Fall or balance concerns reported     No    Warm-up and Cool-down  Performed as group-led instruction    Resistance Training Performed  Yes    VAD Patient?  No      Pain Assessment   Currently in Pain?  No/denies          Social History   Tobacco Use  Smoking Status Former Smoker  . Packs/day: 0.50  . Years: 20.00  . Pack years: 10.00  . Types: Cigarettes  . Last attempt to quit: 02/04/1990  . Years since quitting: 27.4  Smokeless Tobacco Never Used    Goals Met:  Independence with exercise equipment Exercise tolerated well No report of cardiac concerns or symptoms Strength training completed today  Goals Unmet:  Not Applicable  Comments: Pt able to follow exercise prescription today without complaint.  Will continue to monitor for progression.   Dr. Emily Filbert is Medical Director for [place] and [name].

## 2017-07-03 DIAGNOSIS — Z79899 Other long term (current) drug therapy: Secondary | ICD-10-CM | POA: Diagnosis not present

## 2017-07-03 DIAGNOSIS — I130 Hypertensive heart and chronic kidney disease with heart failure and stage 1 through stage 4 chronic kidney disease, or unspecified chronic kidney disease: Secondary | ICD-10-CM | POA: Diagnosis not present

## 2017-07-03 DIAGNOSIS — Z7951 Long term (current) use of inhaled steroids: Secondary | ICD-10-CM | POA: Diagnosis not present

## 2017-07-03 DIAGNOSIS — I509 Heart failure, unspecified: Secondary | ICD-10-CM

## 2017-07-03 DIAGNOSIS — I5022 Chronic systolic (congestive) heart failure: Secondary | ICD-10-CM | POA: Diagnosis not present

## 2017-07-03 DIAGNOSIS — N183 Chronic kidney disease, stage 3 (moderate): Secondary | ICD-10-CM | POA: Diagnosis not present

## 2017-07-03 DIAGNOSIS — Z7982 Long term (current) use of aspirin: Secondary | ICD-10-CM | POA: Diagnosis not present

## 2017-07-03 NOTE — Progress Notes (Signed)
Daily Session Note  Patient Details  Name: Megean Fabio MRN: [id number] Date of Birth: Oct 23, 1940 Referring Provider:     Pulmonary Rehab from 04/20/2017 in [facility] Cardiac and Pulmonary Rehab  Referring Provider  [name] MD      Encounter Date: 07/03/2017  Check In: Session Check In - 07/03/17 1012      Check-In   Location  [facility]Cardiac [facility] Pulmonary Rehab    Staff Present  Justin Mend Lindell Spar, BA, ACSM CEP, Exercise Physiologist;Meredith Sherryll Burger, RN BSN    Supervising physician immediately available to respond to emergencies  [name] immediately available ER MD    Physician(s)  Dr. Alfred Levins and Corky Downs    Medication changes reported      No    Fall or balance concerns reported     No    Warm-up and Cool-down  Performed as group-led instruction    Resistance Training Performed  Yes    VAD Patient?  No      Pain Assessment   Currently in Pain?  No/denies          Social History   Tobacco Use  Smoking Status Former Smoker  . Packs/day: 0.50  . Years: 20.00  . Pack years: 10.00  . Types: Cigarettes  . Last attempt to quit: 02/04/1990  . Years since quitting: 27.4  Smokeless Tobacco Never Used    Goals Met:  Independence with exercise equipment Exercise tolerated well No report of cardiac concerns or symptoms Strength training completed today  Goals Unmet:  Not Applicable  Comments: Pt able to follow exercise prescription today without complaint.  Will continue to monitor for progression.   Dr. Emily Filbert is Medical Director for [place] and [name].

## 2017-07-06 DIAGNOSIS — Z79899 Other long term (current) drug therapy: Secondary | ICD-10-CM | POA: Diagnosis not present

## 2017-07-06 DIAGNOSIS — N183 Chronic kidney disease, stage 3 (moderate): Secondary | ICD-10-CM | POA: Diagnosis not present

## 2017-07-06 DIAGNOSIS — I5022 Chronic systolic (congestive) heart failure: Secondary | ICD-10-CM | POA: Diagnosis not present

## 2017-07-06 DIAGNOSIS — I509 Heart failure, unspecified: Secondary | ICD-10-CM

## 2017-07-06 DIAGNOSIS — I130 Hypertensive heart and chronic kidney disease with heart failure and stage 1 through stage 4 chronic kidney disease, or unspecified chronic kidney disease: Secondary | ICD-10-CM | POA: Diagnosis not present

## 2017-07-06 DIAGNOSIS — Z7951 Long term (current) use of inhaled steroids: Secondary | ICD-10-CM | POA: Diagnosis not present

## 2017-07-06 DIAGNOSIS — Z7982 Long term (current) use of aspirin: Secondary | ICD-10-CM | POA: Diagnosis not present

## 2017-07-06 NOTE — Progress Notes (Signed)
Pulmonary Individual Treatment Plan  Patient Details  Name: Elizabeth Hall MRN: [id number] Date of Birth: [birth year] Referring Provider:     Pulmonary Rehab from 04/20/2017 in [facility] Cardiac and Pulmonary Rehab  Referring Provider  [name] MD      Initial Encounter Date:    Pulmonary Rehab from 04/20/2017 in [facility] Cardiac and Pulmonary Rehab  Date  04/20/17  Referring Provider  [name] MD      Visit Diagnosis: Chronic congestive heart failure, unspecified heart failure type [facility])  Patient's Home Medications on Admission:  Current Outpatient Medications:  .  acetaminophen (TYLENOL) 500 MG tablet, Take 500-1,000 mg by mouth every 6 (six) hours as needed (for headaches/pain.)., Disp: , Rfl:  .  aspirin EC 81 MG tablet, Take 81 mg by mouth daily., Disp: , Rfl:  .  bumetanide (BUMEX) 2 MG tablet, TAKE 1 TABLET(2 MG) BY MOUTH DAILY, Disp: 90 tablet, Rfl: 3 .  bumetanide (BUMEX) 2 MG tablet, TAKE 1 TABLET (2 MG TOTAL) BY MOUTH DAILY., Disp: 90 tablet, Rfl: 1 .  carvedilol (COREG) 12.5 MG tablet, Take 1 tablet (12.5 mg total) by mouth 2 (two) times daily with a meal., Disp: 180 tablet, Rfl: 3 .  estradiol (ESTRACE) 1 MG tablet, Take 0.5 mg by mouth 2 (two) times a week. , Disp: , Rfl:  .  latanoprost (XALATAN) 0.005 % ophthalmic solution, Place 1 drop into both eyes at bedtime., Disp: , Rfl: 99 .  Magnesium 250 MG TABS, Take 250 mg by mouth daily., Disp: , Rfl:  .  meclizine (ANTIVERT) 25 MG tablet, Take 1 tablet (25 mg total) by mouth 2 (two) times daily as needed for dizziness., Disp: 180 tablet, Rfl: 0 .  Polyethyl Glycol-Propyl Glycol (LUBRICANT EYE DROPS) 0.4-0.3 % SOLN, Place 1 drop into both eyes 3 (three) times daily as needed (for dry eyes.)., Disp: , Rfl:  .  sacubitril-valsartan (ENTRESTO) 24-26 MG, Take 1 tablet 2 (two) times daily by mouth., Disp: 180 tablet, Rfl: 3 .  spironolactone (ALDACTONE) 25 MG tablet, TAKE 1/2 TABLET(12.5 MG) BY MOUTH DAILY, Disp: 30  tablet, Rfl: 3 .  vitamin E 400 UNIT capsule, Take 400 Units by mouth daily., Disp: , Rfl:  .  warfarin (COUMADIN) 3 MG tablet, Take as directed by [facility], Disp: 90 tablet, Rfl: 1  Past Medical History: Past Medical History:  Diagnosis Date  . Bleeding of eye   . Chronic systolic heart failure ([place])    a. 11/2016 Echo: EF 35-40%, mild MR w/ nl fxning bioprosthesis, mod dil LA.  . CKD (chronic kidney disease) stage 3, GFR 30-59 ml/min (HCC)    baseline Cr 1.5-2; prior saw nephrologist thought hypertensive nephropathy/renovascular disease  . CVA (cerebral infarction)    a. 2014 occipital lobe - some vision loss when coumadin held and not bridged  . Dyslipidemia   . Glaucoma   . H. pylori infection    treated  . H/O mitral valve replacement    a. 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  .Marland KitchenHx of rheumatic fever   . Hypertension   . Melanoma ([place] 2006   L shoulder  . 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 08/2013 s/p Biotronik Ilesto 7 HF1 BiV ICD (ser# [id number];  b. 11/2016 Echo: EF 35-40%.  . Non-obstructive CAD    a. s/p MI 2001;  b. 12/2016 Cath: LM 20ost, OM2 60, RCA 30p/d, EF 35-45%. Nl R heart filling pressures.  . Permanent atrial fibrillation (  [place])    a. 04/2012 s/p AVN & PPM placement (later upgraded to BiV ICD).  [name] Phlebitis   . 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)    a. 06/2012 h/o acute ischemia due to thromboembolism radial and ulnar arteries when coumadin held s/p atherectomy - needs bridging if off coumadin  . Tricuspid regurgitation    a. 05/2012 s/p TV annuloplasty @ time of MVR.    Tobacco Use: Social History   Tobacco Use  Smoking Status Former Smoker  . Packs/day: 0.50  . Years: 20.00  . Pack years: 10.00  . Types: Cigarettes  . Last attempt to quit: 02/04/1990  . Years since quitting: 27.4  Smokeless Tobacco Never Used    Labs: Recent Chemical engineer    Labs for ITP Cardiac and Pulmonary Rehab  Latest Ref Rng & Units 06/12/2014 06/27/2015 07/07/2016   Cholestrol 0 - 200 mg/dL 192 160 -   LDLCALC 0 - 99 mg/dL 104 96 -   HDL >39.00 mg/dL 74(A) 51.60 -   Trlycerides 0.0 - 149.0 mg/dL 70 62.0 -   Hemoglobin A1c 4.6 - 6.5 % - 6.2 6.1       Pulmonary Assessment Scores: Pulmonary Assessment Scores    Row Name 04/20/17 1453 06/15/17 1142       ADL UCSD   ADL Phase  Entry  Mid    SOB Score total  42  32    Rest  0  0    Walk  1  0    Stairs  4  4    Bath  1  1    Dress  2  0    Shop  3  2      CAT Score   CAT Score  12  -      mMRC Score   mMRC Score  3  -       Pulmonary Function Assessment: Pulmonary Function Assessment - 04/20/17 1457      Breath   Bilateral Breath Sounds  Clear    Shortness of Breath  No;Limiting activity Her shortness of breath make it more of an annoyance for her       Exercise Target Goals:    Exercise Program Goal: Individual exercise prescription set with THRR, safety & activity barriers. Participant demonstrates ability to understand and report RPE using BORG scale, to self-measure pulse accurately, and to acknowledge the importance of the exercise prescription.  Exercise Prescription Goal: Starting with aerobic activity 30 plus minutes a day, 3 days per week for initial exercise prescription. Provide home exercise prescription and guidelines that participant acknowledges understanding prior to discharge.  Activity Barriers & Risk Stratification: Activity Barriers & Cardiac Risk Stratification - 04/20/17 1555      Activity Barriers & Cardiac Risk Stratification   Activity Barriers  Muscular Weakness;Shortness of Breath;Deconditioning;Balance Concerns;Decreased Ventricular Function       6 Minute Walk: 6 Minute Walk    Row Name 04/20/17 1553         6 Minute Walk   Phase  Initial     Distance  1260 feet     Walk Time  6 minutes     # of Rest Breaks  0     MPH  2.39     METS  2.6     RPE  11     Perceived Dyspnea   1.5      VO2 Peak  9.09     Symptoms  No  Resting HR  74 bpm     Resting BP  122/70     Resting Oxygen Saturation   98 %     Exercise Oxygen Saturation  during 6 min walk  93 %     Max Ex. HR  106 bpm     Max Ex. BP  126/60     2 Minute Post BP  122/64       Interval HR   1 Minute HR  104     2 Minute HR  101     3 Minute HR  91     4 Minute HR  86     5 Minute HR  98     6 Minute HR  106     2 Minute Post HR  76     Interval Heart Rate?  Yes       Interval Oxygen   Interval Oxygen?  Yes     Baseline Oxygen Saturation %  98 %     1 Minute Oxygen Saturation %  94 %     1 Minute Liters of Oxygen  0 L Room Air     2 Minute Oxygen Saturation %  94 %     2 Minute Liters of Oxygen  0 L     3 Minute Oxygen Saturation %  95 %     3 Minute Liters of Oxygen  0 L     4 Minute Oxygen Saturation %  94 %     4 Minute Liters of Oxygen  0 L     5 Minute Oxygen Saturation %  94 %     5 Minute Liters of Oxygen  0 L     6 Minute Oxygen Saturation %  93 %     6 Minute Liters of Oxygen  0 L     2 Minute Post Oxygen Saturation %  100 %     2 Minute Post Liters of Oxygen  0 L       Oxygen Initial Assessment: Oxygen Initial Assessment - 04/20/17 1504      Home Oxygen   Home Oxygen Device  None    Sleep Oxygen Prescription  None    Home Exercise Oxygen Prescription  None    Home at Rest Exercise Oxygen Prescription  None      Initial 6 min Walk   Oxygen Used  None      Program Oxygen Prescription   Program Oxygen Prescription  None      Intervention   Short Term Goals  To learn and understand importance of maintaining oxygen saturations>88%;To learn and demonstrate proper pursed lip breathing techniques or other breathing techniques.;To learn and understand importance of monitoring SPO2 with pulse oximeter and demonstrate accurate use of the pulse oximeter.    [place]  Term Goals  Exhibits proper breathing techniques, such as pursed lip breathing or other method taught during program  session;Verbalizes importance of monitoring SPO2 with pulse oximeter and return demonstration;Maintenance of O2 saturations>88%       Oxygen Re-Evaluation: Oxygen Re-Evaluation    Row Name 04/27/17 1028 05/18/17 1655 06/15/17 1113         Program Oxygen Prescription   Program Oxygen Prescription  None  None  None       Home Oxygen   Home Oxygen Device  None  None  None     Sleep Oxygen Prescription  None  None  None     Home  Exercise Oxygen Prescription  None  None  None     Home at Rest Exercise Oxygen Prescription  None  None  None       Goals/Expected Outcomes   Short Term Goals  To learn and understand importance of maintaining oxygen saturations>88%;To learn and demonstrate proper pursed lip breathing techniques or other breathing techniques.;To learn and understand importance of monitoring SPO2 with pulse oximeter and demonstrate accurate use of the pulse oximeter.  To learn and understand importance of maintaining oxygen saturations>88%;To learn and demonstrate proper pursed lip breathing techniques or other breathing techniques.;To learn and understand importance of monitoring SPO2 with pulse oximeter and demonstrate accurate use of the pulse oximeter.  To learn and understand importance of maintaining oxygen saturations>88%;To learn and demonstrate proper pursed lip breathing techniques or other breathing techniques.;To learn and understand importance of monitoring SPO2 with pulse oximeter and demonstrate accurate use of the pulse oximeter.     [place]  Term Goals  Exhibits proper breathing techniques, such as pursed lip breathing or other method taught during program session;Maintenance of O2 saturations>88%;Verbalizes importance of monitoring SPO2 with pulse oximeter and return demonstration  Exhibits proper breathing techniques, such as pursed lip breathing or other method taught during program session;Maintenance of O2 saturations>88%;Verbalizes importance of monitoring SPO2 with  pulse oximeter and return demonstration  Exhibits proper breathing techniques, such as pursed lip breathing or other method taught during program session;Maintenance of O2 saturations>88%;Verbalizes importance of monitoring SPO2 with pulse oximeter and return demonstration     Comments  Reviewed PLB technique with pt.  Talked about how it work and it's important to maintaining his exercise saturations.    Bethena Roys does not wear oxygen at home and does not wear a CPAP. She does not use inhalers or nebulizers. Bethena Roys needs to work on her PLB when she gets short of breath. She has a pulse oximeter at home and checks her saturations. Bethena Roys states she stays around 97-98 percent.  Bethena Roys is using her pursed lip breathing when she remembers and notes that it really does make a difference in controlling her breathing. She continues to use her pulse oximeter to monitor her saturations.  She contiues to manage her disease and has found that her breathing is getting better as she does more exercise.       Goals/Expected Outcomes  Short: Become more profiecient at using PLB.   Stanke: Become independent at using PLB.  Short: decrease WOB. [place]: be proficient with PLB to decrease WOB   Short: [place] to use pursed lip breathing more routinely .  Jamroz: Use pursed lip breathing without having to reminder herself to do it.         Oxygen Discharge (Final Oxygen Re-Evaluation): Oxygen Re-Evaluation - 06/15/17 1113      Program Oxygen Prescription   Program Oxygen Prescription  None      Home Oxygen   Home Oxygen Device  None    Sleep Oxygen Prescription  None    Home Exercise Oxygen Prescription  None    Home at Rest Exercise Oxygen Prescription  None      Goals/Expected Outcomes   Short Term Goals  To learn and understand importance of maintaining oxygen saturations>88%;To learn and demonstrate proper pursed lip breathing techniques or other breathing techniques.;To learn and understand importance of monitoring SPO2 with  pulse oximeter and demonstrate accurate use of the pulse oximeter.    [place]  Term Goals  Exhibits proper breathing techniques, such as pursed lip breathing or  other method taught during program session;Maintenance of O2 saturations>88%;Verbalizes importance of monitoring SPO2 with pulse oximeter and return demonstration    Comments  Bethena Roys is using her pursed lip breathing when she remembers and notes that it really does make a difference in controlling her breathing. She continues to use her pulse oximeter to monitor her saturations.  She contiues to manage her disease and has found that her breathing is getting better as she does more exercise.      Goals/Expected Outcomes  Short: [place] to use pursed lip breathing more routinely .  Boye: Use pursed lip breathing without having to reminder herself to do it.        Initial Exercise Prescription: Initial Exercise Prescription - 04/20/17 1500      Date of Initial Exercise RX and Referring Provider   Date  04/20/17    Referring Provider  [name] MD      Treadmill   MPH  2.1    Grade  0    Minutes  15    METs  2.61      NuStep   Level  2    SPM  80    Minutes  15    METs  2.6      REL-XR   Level  1    Speed  50    Minutes  15    METs  2.6      Prescription Details   Frequency (times per week)  3    Duration  Progress to 45 minutes of aerobic exercise without signs/symptoms of physical distress      Intensity   THRR 40-80% of Max Heartrate  103-132    Ratings of Perceived Exertion  11-13    Perceived Dyspnea  0-4      Progression   Progression  Continue to progress workloads to maintain intensity without signs/symptoms of physical distress.      Resistance Training   Training Prescription  Yes    Weight  3 lbs    Reps  10-15       Perform Capillary Blood Glucose checks as needed.  Exercise Prescription Changes: Exercise Prescription Changes    Row Name 04/20/17 1500 04/29/17 1200 05/01/17 1000 05/13/17 1000  05/27/17 1200     Response to Exercise   Blood Pressure (Admit)  122/70  126/64  -  108/62  126/70   Blood Pressure (Exercise)  126/60  -  -  134/64  -   Blood Pressure (Exit)  122/64  114/56  -  102/60  122/74   Heart Rate (Admit)  74 bpm  77 bpm  -  98 bpm  77 bpm   Heart Rate (Exercise)  106 bpm  100 bpm  -  106 bpm  122 bpm   Heart Rate (Exit)  76 bpm  75 bpm  -  78 bpm  73 bpm   Oxygen Saturation (Admit)  98 %  98 %  -  100 %  98 %   Oxygen Saturation (Exercise)  93 %  99 %  -  100 %  97 %   Oxygen Saturation (Exit)  100 %  99 %  -  100 %  97 %   Rating of Perceived Exertion (Exercise)  11  10  -  13  14   Perceived Dyspnea (Exercise)  1.5  1  -  1  2   Symptoms  none  none  -  none  none  Comments  walk test results  -  -  -  -   Duration  -  Progress to 45 minutes of aerobic exercise without signs/symptoms of physical distress  -  Progress to 45 minutes of aerobic exercise without signs/symptoms of physical distress  Continue with 45 min of aerobic exercise without signs/symptoms of physical distress.   Intensity  -  THRR unchanged  -  THRR unchanged  THRR unchanged     Progression   Progression  -  Continue to progress workloads to maintain intensity without signs/symptoms of physical distress.  -  Continue to progress workloads to maintain intensity without signs/symptoms of physical distress.  Continue to progress workloads to maintain intensity without signs/symptoms of physical distress.   Average METs  -  3.3  -  3.4  2.7     Resistance Training   Training Prescription  -  Yes  -  Yes  Yes   Weight  -  3 lbs  -  3 lbs  3 lb   Reps  -  10-15  -  10-15  10-15     Interval Training   Interval Training  -  No  -  No  -     Treadmill   MPH  -  -  -  -  3   Grade  -  -  -  -  0   Minutes  -  -  -  -  15   METs  -  -  -  -  3.3     NuStep   Level  -  2  -  4  -   SPM  -  91  -  86  -   Minutes  -  15  -  15  -   METs  -  2.7  -  3.2  -     Arm Ergometer   Level  -   -  -  -  1.4   Minutes  -  -  -  -  15   METs  -  -  -  -  2     REL-XR   Level  -  1  -  1  -   Speed  -  50  -  54  -   Minutes  -  15  -  15  -   METs  -  3.9  -  3.6  -     Home Exercise Plan   Plans to continue exercise at  -  -  Home (comment) walking at home.  Home (comment) walking at home.  Home (comment) walking at home.   Frequency  -  -  Add 2 additional days to program exercise sessions.  Add 2 additional days to program exercise sessions.  Add 2 additional days to program exercise sessions.   Initial Home Exercises Provided  -  -  05/01/17  05/01/17  05/01/17   Row Name 06/10/17 1200 06/26/17 1100           Response to Exercise   Blood Pressure (Admit)  102/62  118/60      Blood Pressure (Exit)  126/70  112/60      Heart Rate (Admit)  77 bpm  83 bpm      Heart Rate (Exercise)  136 bpm  140 bpm      Heart Rate (Exit)  75 bpm  76 bpm  Oxygen Saturation (Admit)  98 %  98 %      Oxygen Saturation (Exercise)  95 %  98 %      Oxygen Saturation (Exit)  98 %  96 %      Rating of Perceived Exertion (Exercise)  13  13      Perceived Dyspnea (Exercise)  1  1      Symptoms  none  none      Duration  Continue with 45 min of aerobic exercise without signs/symptoms of physical distress.  Continue with 45 min of aerobic exercise without signs/symptoms of physical distress.      Intensity  THRR unchanged  THRR unchanged        Progression   Progression  Continue to progress workloads to maintain intensity without signs/symptoms of physical distress.  Continue to progress workloads to maintain intensity without signs/symptoms of physical distress.      Average METs  2.86  3.76        Resistance Training   Training Prescription  Yes  -      Weight  3 lb  -      Reps  10-15  -        Interval Training   Interval Training  No  -        Treadmill   MPH  3  3      Grade  0  2      Minutes  15  15      METs  3.3  4.12        NuStep   Level  6  6      SPM  66  68       Minutes  15  15      METs  3.3  3.4        Arm Ergometer   Level  1.4  -      Minutes  15  -      METs  2.1  -        Home Exercise Plan   Plans to continue exercise at  Home (comment) walking at home.  Home (comment) walking at home.      Frequency  Add 2 additional days to program exercise sessions.  Add 2 additional days to program exercise sessions.      Initial Home Exercises Provided  05/01/17  05/01/17         Exercise Comments: Exercise Comments    Row Name 04/27/17 1028 05/01/17 1101         Exercise Comments  First full day of exercise!  Patient was oriented to gym and equipment including functions, settings, policies, and procedures.  Patient's individual exercise prescription and treatment plan were reviewed.  All starting workloads were established based on the results of the 6 minute walk test done at initial orientation visit.  The plan for exercise progression was also introduced and progression will be customized based on patient's performance and goals.  Reviewed home exercise with pt today.  Pt plans to walk 2 extra days a week at home for exercise.  Reviewed THR, pulse, RPE, sign and symptoms, NTG use, and when to call 911 or MD.  Also discussed weather considerations and indoor options.  Pt voiced understanding         Exercise Goals and Review: Exercise Goals    Row Name 04/20/17 1558             Exercise Goals  Increase Physical Activity  Yes       Intervention  Provide advice, education, support and counseling about physical activity/exercise needs.;Develop an individualized exercise prescription for aerobic and resistive training based on initial evaluation findings, risk stratification, comorbidities and participant's personal goals.       Expected Outcomes  Achievement of increased cardiorespiratory fitness and enhanced flexibility, muscular endurance and strength shown through measurements of functional capacity and personal statement of participant.        Increase Strength and Stamina  Yes       Intervention  Provide advice, education, support and counseling about physical activity/exercise needs.;Develop an individualized exercise prescription for aerobic and resistive training based on initial evaluation findings, risk stratification, comorbidities and participant's personal goals.       Expected Outcomes  Achievement of increased cardiorespiratory fitness and enhanced flexibility, muscular endurance and strength shown through measurements of functional capacity and personal statement of participant.       Able to understand and use rate of perceived exertion (RPE) scale  Yes       Intervention  Provide education and explanation on how to use RPE scale       Expected Outcomes  Short Term: Able to use RPE daily in rehab to express subjective intensity level;[place] Term:  Able to use RPE to guide intensity level when exercising independently       Able to understand and use Dyspnea scale  Yes       Intervention  Provide education and explanation on how to use Dyspnea scale       Expected Outcomes  Short Term: Able to use Dyspnea scale daily in rehab to express subjective sense of shortness of breath during exertion;[place] Term: Able to use Dyspnea scale to guide intensity level when exercising independently       Knowledge and understanding of Target Heart Rate Range (THRR)  Yes       Intervention  Provide education and explanation of THRR including how the numbers were predicted and where they are located for reference       Expected Outcomes  Short Term: Able to state/look up THRR;Larkey Term: Able to use THRR to govern intensity when exercising independently;Short Term: Able to use daily as guideline for intensity in rehab       Able to check pulse independently  Yes       Intervention  Provide education and demonstration on how to check pulse in carotid and radial arteries.;Review the importance of being able to check your own pulse for safety during  independent exercise       Expected Outcomes  Short Term: Able to explain why pulse checking is important during independent exercise;[place] Term: Able to check pulse independently and accurately       Understanding of Exercise Prescription  Yes       Intervention  Provide education, explanation, and written materials on patient's individual exercise prescription       Expected Outcomes  Short Term: Able to explain program exercise prescription;Khanna Term: Able to explain home exercise prescription to exercise independently          Exercise Goals Re-Evaluation : Exercise Goals Re-Evaluation    Row Name 04/27/17 1028 04/29/17 1245 05/01/17 1056 05/13/17 1049 05/27/17 1225     Exercise Goal Re-Evaluation   Exercise Goals Review  Understanding of Exercise Prescription;Able to understand and use Dyspnea scale;Knowledge and understanding of Target Heart Rate Range (THRR);Able to understand and use rate of perceived exertion (RPE)  scale  Increase Physical Activity;Increase Strength and Stamina;Able to understand and use Dyspnea scale;Able to understand and use rate of perceived exertion (RPE) scale  Increase Physical Activity;Increase Strength and Stamina  Increase Physical Activity;Increase Strength and Stamina  Increase Physical Activity;Increase Strength and Stamina   Comments  Reviewed RPE scale, THR and program prescription with pt today.  Pt voiced understanding and was given a copy of goals to take home.   Bethena Roys has tolerated exrecise well in her first two sessions.  Staff will continue to monitor for progress.  Reviewed home exercise with pt today.  Pt plans to walk 2 extra days a week at home for exercise.  Reviewed THR, pulse, RPE, sign and symptoms, NTG use, and when to call 911 or MD.  Also discussed weather considerations and indoor options.  Pt voiced understanding  Bethena Roys is tolerating exercise well.  She has missed some sessions due to taking care of her husband.  She plans to return after  Thanksgiving.  Bethena Roys has increased her walk speed.  Staff will monitor progress.   Expected Outcomes  Short: Use RPE daily to regulate intensity.  [place]: Follow program prescription in THR.  Short - Bethena Roys will attend class regularly.  [place] - Bethena Roys will increase overall MET level.  Short: add 2 extra days of walking at home. [place]: add extra days of exercising.  Short - Bethena Roys will return and attend regularly.  [place] - Bethena Roys will increase overall fitness level.  Short - Bethena Roys will build on current levels.  Thier - Bethena Roys will maintain fitness level on her own.   [place] Name 06/10/17 1222 06/15/17 1102 06/26/17 1145         Exercise Goal Re-Evaluation   Exercise Goals Review  Increase Physical Activity;Increase Strength and Stamina;Able to understand and use rate of perceived exertion (RPE) scale  Increase Physical Activity;Increase Strength and Stamina;Able to understand and use rate of perceived exertion (RPE) scale  Increase Physical Activity;Increase Strength and Stamina;Able to understand and use rate of perceived exertion (RPE) scale;Able to understand and use Dyspnea scale;Understanding of Exercise Prescription     Comments  Bethena Roys is progressing well with exercise and has improved overall MET level.  Staff will continue to monitor progress.  Bethena Roys has been doing well in rehab.  She is starting feel stronger and stamina is beginning to come back.  She is doing some home exercise.  She is walking for 30 min for about 2 days a week.    Bethena Roys has increased overall MET level and increased weight strength training to 4 lb.     Expected Outcomes  Short - Bethena Roys will continue to attend LW regularly [place] - Bethena Roys will improve overall MET level  Short: Maintain her home exercise routine.  Pitter: Continue to work on improving her strength and stamina.   Short - Bethena Roys will continue to improve [place] - Bethena Roys will maintain fitness on her own        Discharge Exercise Prescription (Final Exercise Prescription Changes): Exercise  Prescription Changes - 06/26/17 1100      Response to Exercise   Blood Pressure (Admit)  118/60    Blood Pressure (Exit)  112/60    Heart Rate (Admit)  83 bpm    Heart Rate (Exercise)  140 bpm    Heart Rate (Exit)  76 bpm    Oxygen Saturation (Admit)  98 %    Oxygen Saturation (Exercise)  98 %    Oxygen Saturation (Exit)  96 %  Rating of Perceived Exertion (Exercise)  13    Perceived Dyspnea (Exercise)  1    Symptoms  none    Duration  Continue with 45 min of aerobic exercise without signs/symptoms of physical distress.    Intensity  THRR unchanged      Progression   Progression  Continue to progress workloads to maintain intensity without signs/symptoms of physical distress.    Average METs  3.76      Treadmill   MPH  3    Grade  2    Minutes  15    METs  4.12      NuStep   Level  6    SPM  68    Minutes  15    METs  3.4      Home Exercise Plan   Plans to continue exercise at  Home (comment) walking at home.    Frequency  Add 2 additional days to program exercise sessions.    Initial Home Exercises Provided  05/01/17       Nutrition:  Target Goals: Understanding of nutrition guidelines, daily intake of sodium <1525m, cholesterol <2053m calories 30% from fat and 7% or less from saturated fats, daily to have 5 or more servings of fruits and vegetables.  Biometrics: Pre Biometrics - 04/20/17 1558      Pre Biometrics   Height  5' 3.5" (1.613 m)    Weight  153 lb 11.2 oz (69.7 kg)    Waist Circumference  34 inches    Hip Circumference  39.75 inches    Waist to Hip Ratio  0.86 %    BMI (Calculated)  26.8        Nutrition Therapy Plan and Nutrition Goals: Nutrition Therapy & Goals - 04/20/17 1448      Personal Nutrition Goals   Nutrition Goal  Eat healthier and lose weight.    Comments  She would like to see the dietician      Intervention Plan   Intervention  Prescribe, educate and counsel regarding individualized specific dietary modifications aiming  towards targeted core components such as weight, hypertension, lipid management, diabetes, heart failure and other comorbidities.;Nutrition handout(s) given to patient.    Expected Outcomes  Short Term Goal: Understand basic principles of dietary content, such as calories, fat, sodium, cholesterol and nutrients.;Short Term Goal: A plan has been developed with personal nutrition goals set during dietitian appointment.;[place] Term Goal: Adherence to prescribed nutrition plan.       Nutrition Discharge: Rate Your Plate Scores: Nutrition Assessments - 04/20/17 1600      MEDFICTS Scores   Pre Score  35       Nutrition Goals Re-Evaluation: Nutrition Goals Re-Evaluation    Row Name 05/06/17 1109 05/20/17 1150 06/15/17 1108         Goals   Current Weight  151 lb 1.6 oz (68.5 kg)  -  154 lb 3.2 oz (69.9 kg)     Nutrition Goal  Eat healthier and lose weight.  -  Eat healthier and lose weight.       Comment  JuBethena Roysas made an appointment to see the dietician.  Scheduled to meet with RD 12/10  Her appointment was rescheduled due to the weather to meet with LiLattie Hawfter class.  She is looking forward to talk to her for some tips to help lose the weight again.      Expected Outcome  Short: meet with dietician. Geibel: Adhere to a diet plan.  Short -  meet with RD  [place] - follow recommendations from RD  Short: Meet with RD.  Dooley: Continue to eat healthier.        Nutrition Goals Discharge (Final Nutrition Goals Re-Evaluation): Nutrition Goals Re-Evaluation - 06/15/17 1108      Goals   Current Weight  154 lb 3.2 oz (69.9 kg)    Nutrition Goal  Eat healthier and lose weight.      Comment  Her appointment was rescheduled due to the weather to meet with Lattie Haw after class.  She is looking forward to talk to her for some tips to help lose the weight again.     Expected Outcome  Short: Meet with RD.  Bremer: Continue to eat healthier.       Psychosocial: Target Goals: Acknowledge presence or absence of  significant depression and/or stress, maximize coping skills, provide positive support system. Participant is able to verbalize types and ability to use techniques and skills needed for reducing stress and depression.   Initial Review & Psychosocial Screening: Initial Psych Review & Screening - 04/20/17 1446      Initial Review   Current issues with  Current Sleep Concerns;Current Stress Concerns    Source of Stress Concerns  Family    Comments  Husband has cancer, he was diagnosed five years ago and is on IV chemo.       Family Dynamics   Good Support System?  Yes    Comments  Patient has one daughter and she lives in [place]. She has one grandson and two step grandkids.      Barriers   Psychosocial barriers to participate in program  The patient should benefit from training in stress management and relaxation.      Screening Interventions   Interventions  Yes;Encouraged to exercise;Program counselor consult;Provide feedback about the scores to participant;To provide support and resources with identified psychosocial needs    Expected Outcomes  Short Term goal: Utilizing psychosocial counselor, staff and physician to assist with identification of specific Stressors or current issues interfering with healing process. Setting desired goal for each stressor or current issue identified.;[place] Term Goal: Stressors or current issues are controlled or eliminated.;Short Term goal: Identification and review with participant of any Quality of Life or Depression concerns found by scoring the questionnaire.;[place] Term goal: The participant improves quality of Life and PHQ9 Scores as seen by post scores and/or verbalization of changes       Quality of Life Scores:   PHQ-9: Recent Review Flowsheet Data    Depression screen [facility] 2/9 04/20/2017 07/07/2016 07/02/2015   Decreased Interest 0 0 0   Down, Depressed, Hopeless 0 0 0   PHQ - 2 Score 0 0 0   Altered sleeping 1 - -   Tired, decreased energy 1 -  -   Change in appetite 1 - -   Feeling bad or failure about yourself  0 - -   Trouble concentrating 0 - -   Moving slowly or fidgety/restless 0 - -   Suicidal thoughts 0 - -   PHQ-9 Score 3 - -   Difficult doing work/chores Not difficult at all - -     Interpretation of Total Score  Total Score Depression Severity:  1-4 = Minimal depression, 5-9 = Mild depression, 10-14 = Moderate depression, 15-19 = Moderately severe depression, 20-27 = Severe depression   Psychosocial Evaluation and Intervention: Psychosocial Evaluation - 04/27/17 1123      Psychosocial Evaluation & Interventions   Interventions  Encouraged to exercise with the program and follow exercise prescription;Stress management education;Relaxation education    Comments  Counselor met with Ms. Skoczylas Bethena Roys) today for initial psychosocial evaluation.  She is a 77 year old who has been experiencing shortness of breath recently.  She has a strong support system with a spouse of 42+ years; an adult daughter and son in law who live close by and friends and active involvement in her local church.  Bethena Roys has had multiple health isues with open heart surgery in 2013 and did a Cardiac Rehab program following that in [place], [place] where she was residing at the time.  Bethena Roys states she sleeps fairly well with 7-8 hours of sleep and her appetite is good as well.  She denies a history of depression or anxiety and is typically in a positive mood currently.  She has multiple stressors with her spouse's health issues (which are improving) and her own - while she works part time.  There was some recent stress with a financial scam last year that has been addressed and is improving now.  Judy's goals are to breathe better and lose some weight while in this program.  She has gym equipment at her apartment complex that she will use and supplement when her doctor releases her to do so.  Staff will follow with Bethena Roys throughout the course of this program.       Expected Outcomes  Bethena Roys will benefit from consistent exercise to achieve her stated goals.  She will also be meeting with the dietician to address her weight loss goal.  The educational and psychoeducational components of this program will help Bethena Roys understand and learn to cope better with her condition and with life stressors.  Staff will follow with her.      Continue Psychosocial Services   Follow up required by staff       Psychosocial Re-Evaluation: Psychosocial Re-Evaluation    [place] Name 05/20/17 1058 05/20/17 1153 06/15/17 1110         Psychosocial Re-Evaluation   Current issues with  Current Stress Concerns  Current Stress Concerns  Current Stress Concerns     Comments  Counselor follow up with Bethena Roys reporting already seeing some progress since coming into this program - with less shortness of breath.  She has been out several weeks due to her husband being in the hospital and that has been stressful, but she reports he is somewhat better and she is relieved.  Counselor commended Bethena Roys on her progress made and commitment to exercise.    Judy's current stress involves helping her spouse who has multiple myeloma.  He has recently had unexplained fever and has been referred to an Infectious disease speciallist.  They both work part time and she enjoys her work and it is not stressful for her.  Judy's stress levels have leveled off.  Her husband continues to be her primary source of stress, but his health starting to calm back down. She continue to work and enjoy her jobs.  The best part of coming to rehab has been that she is feeling like she is making progress with her strength and shortness of breath.     Expected Outcomes  Bethena Roys will continue to exercise consistently for her own health and for managing stress in her life.    Short - Bethena Roys will continue to practice self care so she can better help her spouse.  [place]urtz - Bethena Roys will manage stress through exercising and self care.  Short:  Continue to focus  on her self to improve strength and shortness of breath.  [place]: Continue to manage stress in a healthy way.      Interventions  Stress management education  Encouraged to attend Pulmonary Rehabilitation for the exercise  Encouraged to attend Pulmonary Rehabilitation for the exercise;Stress management education     Continue Psychosocial Services   Follow up required by staff  Follow up required by staff  Follow up required by staff     Comments  -  -  Husband has cancer, he was diagnosed five years ago and is on IV chemo.        Initial Review   Source of Stress Concerns  -  -  Family        Psychosocial Discharge (Final Psychosocial Re-Evaluation): Psychosocial Re-Evaluation - 06/15/17 1110      Psychosocial Re-Evaluation   Current issues with  Current Stress Concerns    Comments  Judy's stress levels have leveled off.  Her husband continues to be her primary source of stress, but his health starting to calm back down. She continue to work and enjoy her jobs.  The best part of coming to rehab has been that she is feeling like she is making progress with her strength and shortness of breath.    Expected Outcomes  Short: Continue to focus on her self to improve strength and shortness of breath.  Rodier: Continue to manage stress in a healthy way.     Interventions  Encouraged to attend Pulmonary Rehabilitation for the exercise;Stress management education    Continue Psychosocial Services   Follow up required by staff    Comments  Husband has cancer, he was diagnosed five years ago and is on IV chemo.       Initial Review   Source of Stress Concerns  Family       Education: Education Goals: Education classes will be provided on a weekly basis, covering required topics. Participant will state understanding/return demonstration of topics presented.  Learning Barriers/Preferences: Learning Barriers/Preferences - 04/20/17 1459      Learning Barriers/Preferences   Learning Barriers  Sight  wears glasses    Learning Preferences  None       Education Topics: Initial Evaluation Education: - Verbal, written and demonstration of respiratory meds, RPE/PD scales, oximetry and breathing techniques. Instruction on use of nebulizers and MDIs: cleaning and proper use, rinsing mouth with steroid doses and importance of monitoring MDI activations.   Pulmonary Rehab from 07/01/2017 in [facility] Cardiac and Pulmonary Rehab  Date  04/20/17  Educator  [facility]  Instruction Review Code  1- Verbalizes Understanding      General Nutrition Guidelines/Fats and Fiber: -Group instruction provided by verbal, written material, models and posters to present the general guidelines for heart healthy nutrition. Gives an explanation and review of dietary fats and fiber.   Controlling Sodium/Reading Food Labels: -Group verbal and written material supporting the discussion of sodium use in heart healthy nutrition. Review and explanation with models, verbal and written materials for utilization of the food label.   Pulmonary Rehab from 07/01/2017 in [facility] Cardiac and Pulmonary Rehab  Date  06/08/17  Educator  CR  Instruction Review Code  1- Verbalizes Understanding      Exercise Physiology & Risk Factors: - Group verbal and written instruction with models to review the exercise physiology of the cardiovascular system and associated critical values. Details cardiovascular disease risk factors and the goals associated with each risk factor.  Pulmonary Rehab from 07/01/2017 in [facility] Cardiac and Pulmonary Rehab  Date  06/26/17  Educator  [facility]  Instruction Review Code  1- Verbalizes Understanding      Aerobic Exercise & Resistance Training: - Gives group verbal and written discussion on the health impact of inactivity. On the components of aerobic and resistive training programs and the benefits of this training and how to safely progress through these programs.   Flexibility, Balance, General Exercise Guidelines: -  Provides group verbal and written instruction on the benefits of flexibility and balance training programs. Provides general exercise guidelines with specific guidelines to those with heart or lung disease. Demonstration and skill practice provided.   Stress Management: - Provides group verbal and written instruction about the health risks of elevated stress, cause of high stress, and healthy ways to reduce stress.   Depression: - Provides group verbal and written instruction on the correlation between heart/lung disease and depressed mood, treatment options, and the stigmas associated with seeking treatment.   Pulmonary Rehab from 07/01/2017 in [facility] Cardiac and Pulmonary Rehab  Date  05/06/17  Educator  [facility]  Instruction Review Code  1- Verbalizes Understanding      Exercise & Equipment Safety: - Individual verbal instruction and demonstration of equipment use and safety with use of the equipment.   Pulmonary Rehab from 07/01/2017 in [facility] Cardiac and Pulmonary Rehab  Date  04/20/17  Educator  [facility]  Instruction Review Code  1- Verbalizes Understanding      Infection Prevention: - Provides verbal and written material to individual with discussion of infection control including proper hand washing and proper equipment cleaning during exercise session.   Pulmonary Rehab from 07/01/2017 in [facility] Cardiac and Pulmonary Rehab  Date  04/20/17  Educator  [facility]  Instruction Review Code  1- Verbalizes Understanding      Falls Prevention: - Provides verbal and written material to individual with discussion of falls prevention and safety.   Pulmonary Rehab from 07/01/2017 in [facility] Cardiac and Pulmonary Rehab  Date  04/20/17  Educator  [facility]  Instruction Review Code  1- Verbalizes Understanding      Diabetes: - Individual verbal and written instruction to review signs/symptoms of diabetes, desired ranges of glucose level fasting, after meals and with exercise. Advice that pre and post exercise glucose  checks will be done for 3 sessions at entry of program.   Chronic Lung Diseases: - Group verbal and written instruction to review new updates, new respiratory medications, new advancements in procedures and treatments. Provide informative websites and "800" numbers of self-education.   Lung Procedures: - Group verbal and written instruction to describe testing methods done to diagnose lung disease. Review the outcome of test results. Describe the treatment choices: Pulmonary Function Tests, ABGs and oximetry.   Energy Conservation: - Provide group verbal and written instruction for methods to conserve energy, plan and organize activities. Instruct on pacing techniques, use of adaptive equipment and posture/positioning to relieve shortness of breath.   Pulmonary Rehab from 07/01/2017 in [facility] Cardiac and Pulmonary Rehab  Date  05/20/17  Educator  [facility]  Instruction Review Code  1- Verbalizes Understanding      Triggers: - Group verbal and written instruction to review types of environmental controls: home humidity, furnaces, filters, dust mite/pet prevention, HEPA vacuums. To discuss weather changes, air quality and the benefits of nasal washing.   Pulmonary Rehab from 07/01/2017 in [facility] Cardiac and Pulmonary Rehab  Date  04/29/17  Educator  [facility]  Instruction Review  Code  1- Verbalizes Understanding      Exacerbations: - Group verbal and written instruction to provide: warning signs, infection symptoms, calling MD promptly, preventive modes, and value of vaccinations. Review: effective airway clearance, coughing and/or vibration techniques. Create an Sports administrator.   Pulmonary Rehab from 07/01/2017 in [facility] Cardiac and Pulmonary Rehab  Date  04/29/17  Educator  [facility]  Instruction Review Code  1- Verbalizes Understanding      Oxygen: - Individual and group verbal and written instruction on oxygen therapy. Includes supplement oxygen, available portable oxygen systems, continuous and intermittent  flow rates, oxygen safety, concentrators, and Medicare reimbursement for oxygen.   Pulmonary Rehab from 07/01/2017 in [facility] Cardiac and Pulmonary Rehab  Date  04/20/17  Educator  [facility]  Instruction Review Code  1- Verbalizes Understanding      Respiratory Medications: - Group verbal and written instruction to review medications for lung disease. Drug class, frequency, complications, importance of spacers, rinsing mouth after steroid MDI's, and proper cleaning methods for nebulizers.   Pulmonary Rehab from 07/01/2017 in [facility] Cardiac and Pulmonary Rehab  Date  04/20/17  Educator  [facility]  Instruction Review Code  1- Verbalizes Understanding      AED/CPR: - Group verbal and written instruction with the use of models to demonstrate the basic use of the AED with the basic ABC's of resuscitation.   Pulmonary Rehab from 07/01/2017 in [facility] Cardiac and Pulmonary Rehab  Date  06/12/17  Educator  [facility]  Instruction Review Code  1- Verbalizes Understanding      Breathing Retraining: - Provides individuals verbal and written instruction on purpose, frequency, and proper technique of diaphragmatic breathing and pursed-lipped breathing. Applies individual practice skills.   Pulmonary Rehab from 07/01/2017 in [facility] Cardiac and Pulmonary Rehab  Date  04/20/17  Educator  [facility]  Instruction Review Code  1- Verbalizes Understanding      Anatomy and Physiology of the Lungs: - Group verbal and written instruction with the use of models to provide basic lung anatomy and physiology related to function, structure and complications of lung disease.   Pulmonary Rehab from 07/01/2017 in [facility] Cardiac and Pulmonary Rehab  Date  05/27/17  Educator  [facility]  Instruction Review Code  1- Verbalizes Understanding      Anatomy & Physiology of the Heart: - Group verbal and written instruction and models provide basic cardiac anatomy and physiology, with the coronary electrical and arterial systems. Review of: AMI, Angina, Valve disease,  Heart Failure, Cardiac Arrhythmia, Pacemakers, and the ICD.   Heart Failure: - Group verbal and written instruction on the basics of heart failure: signs/symptoms, treatments, explanation of ejection fraction, enlarged heart and cardiomyopathy.   Sleep Apnea: - Individual verbal and written instruction to review Obstructive Sleep Apnea. Review of risk factors, methods for diagnosing and types of masks and machines for OSA.   Pulmonary Rehab from 07/01/2017 in [facility] Cardiac and Pulmonary Rehab  Date  04/29/17  Educator  [facility]  Instruction Review Code  1- Verbalizes Understanding      Anxiety: - Provides group, verbal and written instruction on the correlation between heart/lung disease and anxiety, treatment options, and management of anxiety.   Relaxation: - Provides group, verbal and written instruction about the benefits of relaxation for patients with heart/lung disease. Also provides patients with examples of relaxation techniques.   Cardiac Medications: - Group verbal and written instruction to review commonly prescribed medications for heart disease. Reviews the medication, class of the drug, and side effects.  Know Your Numbers: -Group verbal and written instruction about important numbers in your health.  Review of Cholesterol, Blood Pressure, Diabetes, and BMI and the role they play in your overall health.   Pulmonary Rehab from 07/01/2017 in [facility] Cardiac and Pulmonary Rehab  Date  07/01/17  Educator  [facility]  Instruction Review Code  1- Verbalizes Understanding      Other: -Provides group and verbal instruction on various topics (see comments)    Knowledge Questionnaire Score: Knowledge Questionnaire Score - 04/20/17 1459      Knowledge Questionnaire Score   Pre Score  9/10 Reviewed with patient        Core Components/Risk Factors/Patient Goals at Admission: Personal Goals and Risk Factors at Admission - 04/20/17 1505      Core Components/Risk Factors/Patient Goals  on Admission    Weight Management  Yes;Weight Loss    Intervention  Weight Management: Develop a combined nutrition and exercise program designed to reach desired caloric intake, while maintaining appropriate intake of nutrient and fiber, sodium and fats, and appropriate energy expenditure required for the weight goal.;Weight Management: Provide education and appropriate resources to help participant work on and attain dietary goals.;Weight Management/Obesity: Establish reasonable short term and E[place] term weight goals.    Admit Weight  153 lb 11.2 oz (69.7 kg)    Goal Weight: Short Term  148 lb (67.1 kg)    Goal Weight: [place] Term  135 lb (61.2 kg)    Expected Outcomes  Ben Term: Adherence to nutrition and physical activity/exercise program aimed toward attainment of established weight goal;Weight Maintenance: Understanding of the daily nutrition guidelines, which includes 25-35% calories from fat, 7% or less cal from saturated fats, less than 233m cholesterol, less than 1.5gm of sodium, & 5 or more servings of fruits and vegetables daily;Short Term: Continue to assess and modify interventions until short term weight is achieved;Weight Loss: Understanding of general recommendations for a balanced deficit meal plan, which promotes 1-2 lb weight loss per week and includes a negative energy balance of [phone] kcal/d;Understanding recommendations for meals to include 15-35% energy as protein, 25-35% energy from fat, 35-60% energy from carbohydrates, less than 2074mof dietary cholesterol, 20-35 gm of total fiber daily;Understanding of distribution of calorie intake throughout the day with the consumption of 4-5 meals/snacks    Improve shortness of breath with ADL's  Yes    Intervention  Provide education, individualized exercise plan and daily activity instruction to help decrease symptoms of SOB with activities of daily living.    Expected Outcomes  Short Term: Achieves a reduction of symptoms when  performing activities of daily living.    Heart Failure  Yes    Intervention  Provide a combined exercise and nutrition program that is supplemented with education, support and counseling about heart failure. Directed toward relieving symptoms such as shortness of breath, decreased exercise tolerance, and extremity edema.    Expected Outcomes  Improve functional capacity of life;Short term: Attendance in program 2-3 days a week with increased exercise capacity. Reported lower sodium intake. Reported increased fruit and vegetable intake. Reports medication compliance.;Short term: Daily weights obtained and reported for increase. Utilizing diuretic protocols set by physician.;Bajaj term: Adoption of self-care skills and reduction of barriers for early signs and symptoms recognition and intervention leading to self-care maintenance.    Hypertension  Yes    Intervention  Provide education on lifestyle modifcations including regular physical activity/exercise, weight management, moderate sodium restriction and increased consumption of fresh fruit, vegetables, and low  fat dairy, alcohol moderation, and smoking cessation.;Monitor prescription use compliance.    Expected Outcomes  Gattuso Term: Maintenance of blood pressure at goal levels.;Short Term: Continued assessment and intervention until BP is < 140/60m HG in hypertensive participants. < 130/819mHG in hypertensive participants with diabetes, heart failure or chronic kidney disease.    Stress  Yes    Intervention  Offer individual and/or small group education and counseling on adjustment to heart disease, stress management and health-related lifestyle change. Teach and support self-help strategies.;Refer participants experiencing significant psychosocial distress to appropriate mental health specialists for further evaluation and treatment. When possible, include family members and significant others in education/counseling sessions.    Expected Outcomes  Short  Term: Participant demonstrates changes in health-related behavior, relaxation and other stress management skills, ability to obtain effective social support, and compliance with psychotropic medications if prescribed.;Pereira Term: Emotional wellbeing is indicated by absence of clinically significant psychosocial distress or social isolation.       Core Components/Risk Factors/Patient Goals Review:  Goals and Risk Factor Review    Row Name 05/20/17 1150 06/15/17 1104           Core Components/Risk Factors/Patient Goals Review   Personal Goals Review  Weight Management/Obesity;Hypertension;Stress;Improve shortness of breath with ADL's  Weight Management/Obesity;Hypertension;Improve shortness of breath with ADL's      Review  JuBethena Royss scheduled to meet with RD 12/10.  She states she is not as short of breath when walking and carrying.  She is not getting a lot of exrecise at home.  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  Her stress is from his illness and ongoing complications.  JuBethena Roysas been doing well in rehab.  She has gained a little but plans to take an extra fluid pill today.  Her blood pressures have been doing good but she does not check them at home.  She plans to get the machine out and start checking more frequently for her and her husband.  She is doing better with her SOB resting.  She finds that it still happens with heavier exertion, but has gotten a lot better since starting program.        Expected Outcomes  Short - JuBethena Roysill meet with RD and staff will review home exercise with her.  [place] - JuBethena Roysill begin more healthy eating and add exercise at home.  Short: Get out blood pressure cuff and start checking at home.  Snuffer: Continue to work on weight loss and risk factors.          Core Components/Risk Factors/Patient Goals at Discharge (Final Review):  Goals and Risk Factor Review - 06/15/17 1104      Core Components/Risk Factors/Patient Goals Review   Personal Goals  Review  Weight Management/Obesity;Hypertension;Improve shortness of breath with ADL's    Review  JuBethena Roysas been doing well in rehab.  She has gained a little but plans to take an extra fluid pill today.  Her blood pressures have been doing good but she does not check them at home.  She plans to get the machine out and start checking more frequently for her and her husband.  She is doing better with her SOB resting.  She finds that it still happens with heavier exertion, but has gotten a lot better since starting program.      Expected Outcomes  Short: Get out blood pressure cuff and start checking at home.  Graver: Continue to work on weight loss  and risk factors.        ITP Comments: ITP Comments    Row Name 04/20/17 1427 04/27/17 1121 05/07/17 1447 05/11/17 0825 06/08/17 0816   ITP Comments  Medical Evaluation completed. Chart sent for review and changes to Dr. Emily Filbert Director of [place]. Visit Diagnosis can be found in [facility] encounter 04/20/17  Spent 10 minutes with patient reviewing PLB.  Bethena Roys stopped by to say she will be back after Thanksgiving due to her husbands health.  She will return Monday 11/26.  30 day review completed. ITP sent to Dr. Emily Filbert Director of [place]. Continue with ITP unless changes are made by physician.    30 day review completed. ITP sent to Dr. Emily Filbert Director of [place]. Continue with ITP unless changes are made by physician.     [place] Name 07/06/17 0817           ITP Comments  30 day review completed. ITP sent to Dr. Emily Filbert Director of [place]. Continue with ITP unless changes are made by physician.          Comments: 30 day review

## 2017-07-06 NOTE — Progress Notes (Signed)
Daily Session Note  Patient Details  Name: Elizabeth Hall MRN: [id number] Date of Birth: [birth year] Referring Provider:     Pulmonary Rehab from 04/20/2017 in [facility] Cardiac and Pulmonary Rehab  Referring Provider  [name] MD      Encounter Date: 07/06/2017  Check In: Session Check In - 07/06/17 1014      Check-In   Location  [facility]-Cardiac & Pulmonary Rehab    Staff Present  Nada Maclachlan, BA, ACSM CEP, Exercise Physiologist;Kelly Amedeo Plenty, BS, ACSM CEP, Exercise Physiologist;Gertrue Willette Flavia Shipper    Supervising physician immediately available to respond to emergencies  [name] immediately available ER MD    Physician(s)  Dr. Clearnce Hasten and Reita Cliche    Medication changes reported      No    Fall or balance concerns reported     No    Warm-up and Cool-down  Performed as group-led instruction    Resistance Training Performed  Yes    VAD Patient?  No      Pain Assessment   Currently in Pain?  No/denies          Social History   Tobacco Use  Smoking Status Former Smoker  . Packs/day: 0.50  . Years: 20.00  . Pack years: 10.00  . Types: Cigarettes  . Last attempt to quit: 02/04/1990  . Years since quitting: 27.4  Smokeless Tobacco Never Used    Goals Met:  Independence with exercise equipment Exercise tolerated well No report of cardiac concerns or symptoms Strength training completed today  Goals Unmet:  Not Applicable  Comments: Pt able to follow exercise prescription today without complaint.  Will continue to monitor for progression.   Dr. Emily Filbert is Medical Director for [place] and [name].

## 2017-07-08 DIAGNOSIS — Z79899 Other long term (current) drug therapy: Secondary | ICD-10-CM | POA: Diagnosis not present

## 2017-07-08 DIAGNOSIS — Z7951 Long term (current) use of inhaled steroids: Secondary | ICD-10-CM | POA: Diagnosis not present

## 2017-07-08 DIAGNOSIS — I509 Heart failure, unspecified: Secondary | ICD-10-CM

## 2017-07-08 DIAGNOSIS — I5022 Chronic systolic (congestive) heart failure: Secondary | ICD-10-CM | POA: Diagnosis not present

## 2017-07-08 DIAGNOSIS — N183 Chronic kidney disease, stage 3 (moderate): Secondary | ICD-10-CM | POA: Diagnosis not present

## 2017-07-08 DIAGNOSIS — I130 Hypertensive heart and chronic kidney disease with heart failure and stage 1 through stage 4 chronic kidney disease, or unspecified chronic kidney disease: Secondary | ICD-10-CM | POA: Diagnosis not present

## 2017-07-08 DIAGNOSIS — Z7982 Long term (current) use of aspirin: Secondary | ICD-10-CM | POA: Diagnosis not present

## 2017-07-08 NOTE — Progress Notes (Signed)
Daily Session Note  Patient Details  Name: Elizabeth Hall MRN: [id number] Date of Birth: Jul 02, 1940 Referring Provider:     Pulmonary Rehab from 04/20/2017 in [facility] Cardiac and Pulmonary Rehab  Referring Provider  [name] MD      Encounter Date: 07/08/2017  Check In: Session Check In - 07/08/17 1016      Check-In   Location  [facility]-Cardiac & Pulmonary Rehab    Staff Present  Nada Maclachlan, BA, ACSM CEP, Exercise Physiologist;Khush Pasion Darrin Nipper, [place], RCEP, CCRP, Exercise Physiologist    Supervising physician immediately available to respond to emergencies  [name] immediately available ER MD    Physician(s)  Dr. Alfred Levins and Burlene Arnt    Medication changes reported      No    Fall or balance concerns reported     No    Warm-up and Cool-down  Performed as group-led instruction    Resistance Training Performed  Yes    VAD Patient?  No      Pain Assessment   Currently in Pain?  No/denies          Social History   Tobacco Use  Smoking Status Former Smoker  . Packs/day: 0.50  . Years: 20.00  . Pack years: 10.00  . Types: Cigarettes  . Last attempt to quit: 02/04/1990  . Years since quitting: 27.4  Smokeless Tobacco Never Used    Goals Met:  Independence with exercise equipment Exercise tolerated well No report of cardiac concerns or symptoms Strength training completed today  Goals Unmet:  Not Applicable  Comments: Pt able to follow exercise prescription today without complaint.  Will continue to monitor for progression.   Dr. Emily Filbert is Medical Director for [facility] and [name].

## 2017-07-10 DIAGNOSIS — I130 Hypertensive heart and chronic kidney disease with heart failure and stage 1 through stage 4 chronic kidney disease, or unspecified chronic kidney disease: Secondary | ICD-10-CM | POA: Diagnosis not present

## 2017-07-10 DIAGNOSIS — Z7982 Long term (current) use of aspirin: Secondary | ICD-10-CM | POA: Diagnosis not present

## 2017-07-10 DIAGNOSIS — Z79899 Other long term (current) drug therapy: Secondary | ICD-10-CM | POA: Diagnosis not present

## 2017-07-10 DIAGNOSIS — I5022 Chronic systolic (congestive) heart failure: Secondary | ICD-10-CM | POA: Diagnosis not present

## 2017-07-10 DIAGNOSIS — Z7951 Long term (current) use of inhaled steroids: Secondary | ICD-10-CM | POA: Diagnosis not present

## 2017-07-10 DIAGNOSIS — I509 Heart failure, unspecified: Secondary | ICD-10-CM

## 2017-07-10 DIAGNOSIS — N183 Chronic kidney disease, stage 3 (moderate): Secondary | ICD-10-CM | POA: Diagnosis not present

## 2017-07-10 NOTE — Progress Notes (Signed)
Daily Session Note  Patient Details  Name: Elizabeth Hall MRN: [id number] Date of Birth: 22-Dec-1940 Referring Provider:     Pulmonary Rehab from 04/20/2017 in [facility] Cardiac and Pulmonary Rehab  Referring Provider  [name] MD      Encounter Date: 07/10/2017  Check In: Session Check In - 07/10/17 1002      Check-In   Location  [facility]-Cardiac & Pulmonary Rehab    Staff Present  Nada Maclachlan, BA, ACSM CEP, Exercise Physiologist;Meredith Sherryll Burger, RN BSN;Lavena Loretto Flavia Shipper    Supervising physician immediately available to respond to emergencies  [name] immediately available ER MD    Physician(s)  Dr. Jodell Cipro and [facility]    Medication changes reported      No    Fall or balance concerns reported     No    Warm-up and Cool-down  Performed as group-led instruction    Resistance Training Performed  Yes    VAD Patient?  No      Pain Assessment   Currently in Pain?  No/denies          Social History   Tobacco Use  Smoking Status Former Smoker  . Packs/day: 0.50  . Years: 20.00  . Pack years: 10.00  . Types: Cigarettes  . Last attempt to quit: 02/04/1990  . Years since quitting: 27.4  Smokeless Tobacco Never Used    Goals Met:  Independence with exercise equipment Exercise tolerated well No report of cardiac concerns or symptoms Strength training completed today  Goals Unmet:  Not Applicable  Comments: Pt able to follow exercise prescription today without complaint.  Will continue to monitor for progression.   Dr. Emily Filbert is Medical Director for [place] and [name].

## 2017-07-13 DIAGNOSIS — N183 Chronic kidney disease, stage 3 (moderate): Secondary | ICD-10-CM | POA: Diagnosis not present

## 2017-07-13 DIAGNOSIS — I130 Hypertensive heart and chronic kidney disease with heart failure and stage 1 through stage 4 chronic kidney disease, or unspecified chronic kidney disease: Secondary | ICD-10-CM | POA: Diagnosis not present

## 2017-07-13 DIAGNOSIS — Z79899 Other long term (current) drug therapy: Secondary | ICD-10-CM | POA: Diagnosis not present

## 2017-07-13 DIAGNOSIS — Z7951 Long term (current) use of inhaled steroids: Secondary | ICD-10-CM | POA: Diagnosis not present

## 2017-07-13 DIAGNOSIS — Z7982 Long term (current) use of aspirin: Secondary | ICD-10-CM | POA: Diagnosis not present

## 2017-07-13 DIAGNOSIS — I509 Heart failure, unspecified: Secondary | ICD-10-CM

## 2017-07-13 DIAGNOSIS — I5022 Chronic systolic (congestive) heart failure: Secondary | ICD-10-CM | POA: Diagnosis not present

## 2017-07-13 NOTE — Progress Notes (Signed)
Daily Session Note  Patient Details  Name: Elizabeth Hall MRN: [id number] Date of Birth: [birth year] Referring Provider:     Pulmonary Rehab from 04/20/2017 in [facility] Cardiac and Pulmonary Rehab  Referring Provider  [name] MD      Encounter Date: 07/13/2017  Check In: Session Check In - 07/13/17 1016      Check-In   Location  [facility]-Cardiac & Pulmonary Rehab    Staff Present  Nada Maclachlan, BA, ACSM CEP, Exercise Physiologist;Kelly Amedeo Plenty, BS, ACSM CEP, Exercise Physiologist;Joseph Flavia Shipper    Supervising physician immediately available to respond to emergencies  [name] immediately available ER MD    Physician(s)  Dr. Cinda Quest and [place]    Medication changes reported      No    Fall or balance concerns reported     No    Warm-up and Cool-down  Performed as group-led instruction    Resistance Training Performed  Yes    VAD Patient?  No      Pain Assessment   Currently in Pain?  No/denies          Social History   Tobacco Use  Smoking Status Former Smoker  . Packs/day: 0.50  . Years: 20.00  . Pack years: 10.00  . Types: Cigarettes  . Last attempt to quit: 02/04/1990  . Years since quitting: 27.4  Smokeless Tobacco Never Used    Goals Met:  Independence with exercise equipment Exercise tolerated well No report of cardiac concerns or symptoms Strength training completed today  Goals Unmet:  Not Applicable  Comments: Pt able to follow exercise prescription today without complaint.  Will continue to monitor for progression.   Dr. Emily Filbert is Medical Director for [place] and [name].

## 2017-07-15 DIAGNOSIS — Z7951 Long term (current) use of inhaled steroids: Secondary | ICD-10-CM | POA: Diagnosis not present

## 2017-07-15 DIAGNOSIS — I509 Heart failure, unspecified: Secondary | ICD-10-CM

## 2017-07-15 DIAGNOSIS — I5022 Chronic systolic (congestive) heart failure: Secondary | ICD-10-CM | POA: Diagnosis not present

## 2017-07-15 DIAGNOSIS — I130 Hypertensive heart and chronic kidney disease with heart failure and stage 1 through stage 4 chronic kidney disease, or unspecified chronic kidney disease: Secondary | ICD-10-CM | POA: Diagnosis not present

## 2017-07-15 DIAGNOSIS — N183 Chronic kidney disease, stage 3 (moderate): Secondary | ICD-10-CM | POA: Diagnosis not present

## 2017-07-15 DIAGNOSIS — Z7982 Long term (current) use of aspirin: Secondary | ICD-10-CM | POA: Diagnosis not present

## 2017-07-15 DIAGNOSIS — Z79899 Other long term (current) drug therapy: Secondary | ICD-10-CM | POA: Diagnosis not present

## 2017-07-15 NOTE — Progress Notes (Signed)
Daily Session Note  Patient Details  Name: Elizabeth Hall MRN: [id number] Date of Birth: 23-Mar-1941 Referring Provider:     Pulmonary Rehab from 04/20/2017 in [facility] Cardiac and Pulmonary Rehab  Referring Provider  [name] MD      Encounter Date: 07/15/2017  Check In: Session Check In - 07/15/17 1133      Check-In   Location  [facility]-Cardiac & Pulmonary Rehab    Staff Present  Nada Maclachlan, BA, ACSM CEP, Exercise Physiologist;Opaline Reyburn Darrin Nipper, [place], RCEP, CCRP, Exercise Physiologist    Supervising physician immediately available to respond to emergencies  [name] immediately available ER MD    Physician(s)  Dr. Mariea Clonts and Jimmye Norman    Medication changes reported      No    Fall or balance concerns reported     No    Warm-up and Cool-down  Performed as group-led instruction    Resistance Training Performed  Yes    VAD Patient?  No      Pain Assessment   Currently in Pain?  No/denies          Social History   Tobacco Use  Smoking Status Former Smoker  . Packs/day: 0.50  . Years: 20.00  . Pack years: 10.00  . Types: Cigarettes  . Last attempt to quit: 02/04/1990  . Years since quitting: 27.4  Smokeless Tobacco Never Used    Goals Met:  Independence with exercise equipment Exercise tolerated well No report of cardiac concerns or symptoms Strength training completed today  Goals Unmet:  Not Applicable  Comments: Pt able to follow exercise prescription today without complaint.  Will continue to monitor for progression.   Dr. Emily Filbert is Medical Director for [place] and [name].

## 2017-07-16 ENCOUNTER — Other Ambulatory Visit: Payer: Self-pay | Admitting: Family Medicine

## 2017-07-16 DIAGNOSIS — N183 Chronic kidney disease, stage 3 unspecified: Secondary | ICD-10-CM

## 2017-07-16 DIAGNOSIS — R7303 Prediabetes: Secondary | ICD-10-CM

## 2017-07-16 DIAGNOSIS — E785 Hyperlipidemia, unspecified: Secondary | ICD-10-CM

## 2017-07-17 DIAGNOSIS — I5022 Chronic systolic (congestive) heart failure: Secondary | ICD-10-CM | POA: Diagnosis not present

## 2017-07-17 DIAGNOSIS — Z7951 Long term (current) use of inhaled steroids: Secondary | ICD-10-CM | POA: Diagnosis not present

## 2017-07-17 DIAGNOSIS — N183 Chronic kidney disease, stage 3 (moderate): Secondary | ICD-10-CM | POA: Diagnosis not present

## 2017-07-17 DIAGNOSIS — Z79899 Other long term (current) drug therapy: Secondary | ICD-10-CM | POA: Diagnosis not present

## 2017-07-17 DIAGNOSIS — I130 Hypertensive heart and chronic kidney disease with heart failure and stage 1 through stage 4 chronic kidney disease, or unspecified chronic kidney disease: Secondary | ICD-10-CM | POA: Diagnosis not present

## 2017-07-17 DIAGNOSIS — Z7982 Long term (current) use of aspirin: Secondary | ICD-10-CM | POA: Diagnosis not present

## 2017-07-17 DIAGNOSIS — I509 Heart failure, unspecified: Secondary | ICD-10-CM

## 2017-07-17 NOTE — Progress Notes (Signed)
Daily Session Note  Patient Details  Name: Elizabeth Hall MRN: [id number] Date of Birth: 11-17-1940 Referring Provider:     Pulmonary Rehab from 04/20/2017 in [facility] Cardiac and Pulmonary Rehab  Referring Provider  [name] MD      Encounter Date: 07/17/2017  Check In: Session Check In - 07/17/17 0956      Check-In   Location  [facility]-Cardiac & Pulmonary Rehab    Staff Present  Nada Maclachlan, BA, ACSM CEP, Exercise Physiologist;Mandi [place], BS, PEC;Joseph [employer] physician immediately available to respond to emergencies  [name] immediately available ER MD    Physician(s)  Dr. Mariea Clonts and Jacqualine Code    Medication changes reported      No    Fall or balance concerns reported     No    Warm-up and Cool-down  Performed as group-led instruction    Resistance Training Performed  Yes    VAD Patient?  No      Pain Assessment   Currently in Pain?  No/denies          Social History   Tobacco Use  Smoking Status Former Smoker  . Packs/day: 0.50  . Years: 20.00  . Pack years: 10.00  . Types: Cigarettes  . Last attempt to quit: 02/04/1990  . Years since quitting: 27.4  Smokeless Tobacco Never Used    Goals Met:  Independence with exercise equipment Exercise tolerated well Personal goals reviewed No report of cardiac concerns or symptoms Strength training completed today  Goals Unmet:  Not Applicable  Comments: Pt able to follow exercise prescription today without complaint.  Will continue to monitor for progression.   Dr. Emily Filbert is Medical Director for [place] and [name].

## 2017-07-20 ENCOUNTER — Ambulatory Visit (INDEPENDENT_AMBULATORY_CARE_PROVIDER_SITE_OTHER): Payer: Medicare Other

## 2017-07-20 ENCOUNTER — Ambulatory Visit: Payer: Medicare Other

## 2017-07-20 ENCOUNTER — Telehealth: Payer: Self-pay | Admitting: Cardiovascular Disease

## 2017-07-20 ENCOUNTER — Ambulatory Visit: Payer: Self-pay

## 2017-07-20 VITALS — BP 98/62 | HR 80 | Temp 97.7°F | Ht 62.5 in | Wt 146.0 lb

## 2017-07-20 DIAGNOSIS — Z0000 Encounter for general adult medical examination without abnormal findings: Secondary | ICD-10-CM | POA: Diagnosis not present

## 2017-07-20 DIAGNOSIS — N183 Chronic kidney disease, stage 3 unspecified: Secondary | ICD-10-CM

## 2017-07-20 DIAGNOSIS — I482 Chronic atrial fibrillation, unspecified: Secondary | ICD-10-CM

## 2017-07-20 DIAGNOSIS — Z79899 Other long term (current) drug therapy: Secondary | ICD-10-CM | POA: Diagnosis not present

## 2017-07-20 DIAGNOSIS — Z7982 Long term (current) use of aspirin: Secondary | ICD-10-CM | POA: Diagnosis not present

## 2017-07-20 DIAGNOSIS — Z5181 Encounter for therapeutic drug level monitoring: Secondary | ICD-10-CM

## 2017-07-20 DIAGNOSIS — Z953 Presence of xenogenic heart valve: Secondary | ICD-10-CM | POA: Diagnosis not present

## 2017-07-20 DIAGNOSIS — I509 Heart failure, unspecified: Secondary | ICD-10-CM

## 2017-07-20 DIAGNOSIS — I5022 Chronic systolic (congestive) heart failure: Secondary | ICD-10-CM | POA: Diagnosis not present

## 2017-07-20 DIAGNOSIS — E785 Hyperlipidemia, unspecified: Secondary | ICD-10-CM | POA: Diagnosis not present

## 2017-07-20 DIAGNOSIS — R7303 Prediabetes: Secondary | ICD-10-CM | POA: Diagnosis not present

## 2017-07-20 DIAGNOSIS — Z7951 Long term (current) use of inhaled steroids: Secondary | ICD-10-CM | POA: Diagnosis not present

## 2017-07-20 DIAGNOSIS — I130 Hypertensive heart and chronic kidney disease with heart failure and stage 1 through stage 4 chronic kidney disease, or unspecified chronic kidney disease: Secondary | ICD-10-CM | POA: Diagnosis not present

## 2017-07-20 DIAGNOSIS — I4891 Unspecified atrial fibrillation: Secondary | ICD-10-CM | POA: Diagnosis not present

## 2017-07-20 LAB — LIPID PANEL
CHOLESTEROL: 170 mg/dL (ref 0–200)
HDL: 54.7 mg/dL (ref >39.00)
LDL Cholesterol: 99 mg/dL (ref 0–99)
NonHDL: 115.05
Total CHOL/HDL Ratio: 3
Triglycerides: 81 mg/dL (ref 0.0–149.0)
VLDL: 16.2 mg/dL (ref 0.0–40.0)

## 2017-07-20 LAB — RENAL FUNCTION PANEL
Albumin: 4.5 g/dL (ref 3.5–5.2)
BUN: 32 mg/dL — ABNORMAL HIGH (ref 6–23)
CO2: 27 mEq/L (ref 19–32)
CREATININE: 1.33 mg/dL — AB (ref 0.40–1.20)
Calcium: 10 mg/dL (ref 8.4–10.5)
Chloride: 102 mEq/L (ref 96–112)
GFR: 41.33 mL/min — AB (ref >60.00)
GLUCOSE: 97 mg/dL (ref 70–99)
PHOSPHORUS: 3.1 mg/dL (ref 2.3–4.6)
POTASSIUM: 4.3 meq/L (ref 3.5–5.1)
Sodium: 139 mEq/L (ref 135–145)

## 2017-07-20 LAB — POCT INR: INR: 2.2

## 2017-07-20 LAB — VITAMIN D 25 HYDROXY (VIT D DEFICIENCY, FRACTURES): VITD: 25.07 ng/mL — AB (ref 30.00–100.00)

## 2017-07-20 LAB — HEMOGLOBIN A1C: HEMOGLOBIN A1C: 6.2 % (ref 4.6–6.5)

## 2017-07-20 NOTE — Patient Instructions (Signed)
Elizabeth Hall , Thank you for taking time to come for your Medicare Wellness Visit. I appreciate your ongoing commitment to your health goals. Please review the following plan we discussed and let me know if I can assist you in the future.   These are the goals we discussed: Goals    . Follow up with Primary Care Provider     Starting 07/20/2017, I will continue to take medications as prescribed and to keep appointments with PCP as scheduled.        This is a list of the screening recommended for you and due dates:  Health Maintenance  Topic Date Due  . DTaP/Tdap/Td vaccine (1 - Tdap) 06/22/2026*  . Tetanus Vaccine  06/22/2026*  . Colon Cancer Screening  04/22/2019  . Flu Shot  Completed  . DEXA scan (bone density measurement)  Completed  . Pneumonia vaccines  Completed  *Topic was postponed. The date shown is not the original due date.   Preventive Care for Adults  A healthy lifestyle and preventive care can promote health and wellness. Preventive health guidelines for adults include the following key practices.  . A routine yearly physical is a good way to check with your health care provider about your health and preventive screening. It is a chance to share any concerns and updates on your health and to receive a thorough exam.  . Visit your dentist for a routine exam and preventive care every 6 months. Brush your teeth twice a day and floss once a day. Good oral hygiene prevents tooth decay and gum disease.  . The frequency of eye exams is based on your age, health, family medical history, use  of contact lenses, and other factors. Follow your health care provider's recommendations for frequency of eye exams.  . Eat a healthy diet. Foods like vegetables, fruits, whole grains, low-fat dairy products, and lean protein foods contain the nutrients you need without too many calories. Decrease your intake of foods high in solid fats, added sugars, and salt. Eat the right amount of calories  for you. Get information about a proper diet from your health care provider, if necessary.  . Regular physical exercise is one of the most important things you can do for your health. Most adults should get at least 150 minutes of moderate-intensity exercise (any activity that increases your heart rate and causes you to sweat) each week. In addition, most adults need muscle-strengthening exercises on 2 or more days a week.  Silver Sneakers may be a benefit available to you. To determine eligibility, you may visit the website: www.silversneakers.com or contact program at (671)739-1660 Mon-Fri between 8AM-8PM.   . Maintain a healthy weight. The body mass index (BMI) is a screening tool to identify possible weight problems. It provides an estimate of body fat based on height and weight. Your health care provider can find your BMI and can help you achieve or maintain a healthy weight.   For adults 20 years and older: ? A BMI below 18.5 is considered underweight. ? A BMI of 18.5 to 24.9 is normal. ? A BMI of 25 to 29.9 is considered overweight. ? A BMI of 30 and above is considered obese.   . Maintain normal blood lipids and cholesterol levels by exercising and minimizing your intake of saturated fat. Eat a balanced diet with plenty of fruit and vegetables. Blood tests for lipids and cholesterol should begin at age 48 and be repeated every 5 years. If your lipid or cholesterol  levels are high, you are over 50, or you are at high risk for heart disease, you may need your cholesterol levels checked more frequently. Ongoing high lipid and cholesterol levels should be treated with medicines if diet and exercise are not working.  . If you smoke, find out from your health care provider how to quit. If you do not use tobacco, please do not start.  . If you choose to drink alcohol, please do not consume more than 2 drinks per day. One drink is considered to be 12 ounces (355 mL) of beer, 5 ounces (148 mL) of  wine, or 1.5 ounces (44 mL) of liquor.  . If you are 42-19 years old, ask your health care provider if you should take aspirin to prevent strokes.  . Use sunscreen. Apply sunscreen liberally and repeatedly throughout the day. You should seek shade when your shadow is shorter than you. Protect yourself by wearing Blatter sleeves, pants, a wide-brimmed hat, and sunglasses year round, whenever you are outdoors.  . Once a month, do a whole body skin exam, using a mirror to look at the skin on your back. Tell your health care provider of new moles, moles that have irregular borders, moles that are larger than a pencil eraser, or moles that have changed in shape or color.

## 2017-07-20 NOTE — Patient Instructions (Signed)
Please take extra 1/2 tablet today, then start new dosage of 1 tablet daily except 1/2 tablet on Mondays and Fridays.   Recheck in 2 weeks.

## 2017-07-20 NOTE — Progress Notes (Signed)
Subjective:   Valeree Leidy Murtaugh is a 77 y.o. female who presents for Medicare Annual (Subsequent) preventive examination.  Review of Systems:  N/A Cardiac Risk Factors include: advanced age (>22men, >48 women);dyslipidemia;hypertension     Objective:     Vitals: BP 98/62 (BP Location: Right Arm, Patient Position: Sitting, Cuff Size: Normal)   Pulse 80   Temp 97.7 F (36.5 C) (Oral)   Ht 5' 2.5" (1.588 m) Comment: no shoes  Wt 146 lb (66.2 kg)   LMP  (LMP Unknown) Comment: menopause age 52  SpO2 95%   BMI 26.28 kg/m   Body mass index is 26.28 kg/m.  Advanced Directives 07/20/2017 05/15/2017 04/20/2017 12/29/2016 07/07/2016  Does Patient Have a Medical Advance Directive? Yes Yes Yes Yes Yes  Type of Paramedic of [place];Living will Living will [place];Living will [place];Living will [place];Living will  Does patient want to make changes to medical advance directive? - No - Patient declined No - Patient declined No - Patient declined -  Copy of [place] in Chart? Yes - - No - copy requested Yes  Would patient like information on creating a medical advance directive? - No - Patient declined - - -    Tobacco Social History   Tobacco Use  Smoking Status Former Smoker  . Packs/day: 0.50  . Years: 20.00  . Pack years: 10.00  . Types: Cigarettes  . Last attempt to quit: 02/04/1990  . 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 Bleeding of eye   . Chronic systolic heart failure ([place])    a. 11/2016 Echo: EF 35-40%, mild MR w/ nl fxning bioprosthesis, mod dil LA.  . CKD (chronic kidney disease) stage 3, GFR 30-59 ml/min (HCC)    baseline Cr 1.5-2; prior saw nephrologist thought hypertensive nephropathy/renovascular disease  . CVA (cerebral infarction)    a. 2014 occipital lobe - some vision loss when coumadin held and not bridged  . Dyslipidemia   . Glaucoma   . H. pylori infection    treated  . H/O mitral valve replacement    a. 05/2012 s/p 29mm Carpentier Edwards pericardial tissue valve.  [name] Hx of rheumatic fever   . Hypertension   . Melanoma ([place]) 2006   L shoulder  . NICM (nonischemic cardiomyopathy) ([place])    a. 08/2013 s/p Biotronik Ilesto 7 HF1 BiV ICD (ser# [id number]);  b. 11/2016 Echo: EF 35-40%.  . Non-obstructive CAD    a. s/p MI 2001;  b. 12/2016 Cath: LM 20ost, OM2 60, RCA 30p/d, EF 35-45%. Nl R heart filling pressures.  . Permanent atrial fibrillation ([place])    a. 04/2012 s/p AVN & PPM placement (later upgraded to BiV ICD).  [name] Phlebitis   . 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 06/2012 h/o acute ischemia due to thromboembolism radial and ulnar arteries when coumadin held s/p atherectomy - needs bridging if off coumadin  . Tricuspid regurgitation    a. 05/2012 s/p TV annuloplasty @ time of MVR.   Past Surgical History:  Procedure Laterality Date  . ABDOMINAL HYSTERECTOMY    . ABLATION  2013  . ATHERECTOMY  06/2012   left arm after coumadin held without bridging  . BREAST BIOPSY    . CARDIOVASCULAR STRESS TEST  03/2016   WNL  . CATARACT EXTRACTION Bilateral 2007, 2010  . COLONOSCOPY  03/2014   diverticulosis, int hem, rpt 5 yrs for h/o polyps ([place])  . defibrillator     MEDTRONIC 5086 MRI 52 CM LEAD, SERIAL # [id number]  [id number]  . DEXA  07/2016   WNL  . DG ABDOMEN COMPLETE ([place] HX)  02/2013   HH, mod severe erosive gastritis, duodenitis  . GALLBLADDER SURGERY  2006  . INSERT / REPLACE / REMOVE PACEMAKER  08/2013  . MITRAL VALVE REPLACEMENT  05/2012  . RIGHT/LEFT HEART CATH AND CORONARY ANGIOGRAPHY N/A 12/29/2016   Procedure: Right/Left Heart Cath and Coronary Angiography;  Surgeon: [name], MD;  Location: [facility];  Service: Cardiovascular;  Laterality: N/A;  . TOTAL ABDOMINAL HYSTERECTOMY W/ BILATERAL SALPINGOOPHORECTOMY  1991   irregular bleeding  . TRICUSPID VALVE REPLACEMENT  05/2012  . US ECHOCARDIOGRAPHY  02/2014   EF 40%, LA marked dilation, gen LV hypokinesis, mitral prosthetic valve   Family History  Problem Relation Age of Onset  . CAD Father 76       MI  . Hypertension Brother   . Heart failure Brother        CHF with pacemaker  . Stroke Brother   . Heart failure Sister        CHF  . Stroke Sister   . Diabetes Cousin   . Cancer Maternal Aunt        colon  . Cancer Cousin        breast  . Cancer Cousin        brain tumor   Social History   Socioeconomic History  . Marital status: Married    Spouse name: None  . Number of children: None  . Years of education: None  . Highest education level: None  Social Needs  . Financial resource strain: None  . Food insecurity - worry: None  . Food insecurity - inability: None  . Transportation needs - medical: None  . Transportation needs - non-medical: None  Occupational History  . None  Tobacco Use  . Smoking status: Former Smoker    Packs/day: 0.50    Years: 20.00    Pack years: 10.00    Types: Cigarettes    Last attempt to quit: 02/04/1990    Years since quitting: 27.4  . Smokeless tobacco: Never Used  Substance and Sexual Activity  . Alcohol use: Yes    Alcohol/week: 0.0 oz    Comment: occasional  . Drug use: No  . Sexual activity: Not Currently    Birth control/protection: None  Other Topics  Concern  . 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 acetaminophen (TYLENOL)  500 MG tablet Take 500-1,000 mg by mouth every 6 (six) hours as needed (for headaches/pain.).  [name] aspirin EC 81 MG tablet Take 81 mg by mouth daily.  . bumetanide (BUMEX) 2 MG tablet TAKE 1 TABLET (2 MG TOTAL) BY MOUTH DAILY.  . carvedilol (COREG) 12.5 MG tablet Take 1 tablet (12.5 mg total) by mouth 2 (two) times daily with a meal.  . estradiol (ESTRACE) 1 MG tablet Take 0.5 mg by mouth 2 (two) times a week.   . latanoprost (XALATAN) 0.005 % ophthalmic solution Place 1 drop into both eyes at bedtime.  . Magnesium 250 MG TABS Take 250 mg by mouth daily.  . meclizine (ANTIVERT) 25 MG tablet Take 1 tablet (25 mg total) by mouth 2 (two) times daily as needed for dizziness.  Vladimir Faster Glycol-Propyl Glycol (LUBRICANT EYE DROPS) 0.4-0.3 % SOLN Place 1 drop into both eyes 3 (three) times daily as needed (for dry eyes.).  [name] sacubitril-valsartan (ENTRESTO) 24-26 MG Take 1 tablet 2 (two) times daily by mouth.  . spironolactone (ALDACTONE) 25 MG tablet TAKE 1/2 TABLET(12.5 MG) BY MOUTH DAILY  . vitamin E 400 UNIT capsule Take 400 Units by mouth daily.  [name] warfarin (COUMADIN) 3 MG tablet Take as directed by [facility]  . [DISCONTINUED] bumetanide (BUMEX) 2 MG tablet TAKE 1 TABLET(2 MG) BY MOUTH DAILY   No facility-administered encounter medications on file as of 07/20/2017.     Activities of Daily Living In your present state of health, do you have any difficulty performing the following activities: 07/20/2017  Hearing? N  Vision? Y  Comment dx of glacoma and possible macular degeneration(results pending)  Difficulty concentrating or making decisions? N  Walking  or climbing stairs? Y  Comment SOB when climbing stairs  Dressing or bathing? N  Doing errands, shopping? N  Preparing Food and eating ? N  Using the Toilet? N  In the past six months, have you accidently leaked urine? Y  Comment intermittent coughing or laughing may cause leakage  Do you have problems with loss of bowel control? N  Managing your Medications? N  Managing your Finances? N  Housekeeping or managing your Housekeeping? N  Some recent data might be hidden    Patient Care Team: Ria Bush, MD as PCP - General (Family Medicine) Vin-Parikh, [name], MD as Referring Physician (Ophthalmology) [name], MD as Consulting Physician (Cardiology) [name], MD as Consulting Physician (Obstetrics and Gynecology) [name], MD as Consulting Physician (Cardiology) [name], MD as Consulting Physician (Dermatology)    Assessment:   This is a routine wellness examination for Z[place]ia.   Hearing Screening   125Hz  250Hz  500Hz  1000Hz  2000Hz  3000Hz  4000Hz  6000Hz  8000Hz   Right ear:   40 40 40  40    Left ear:   40 40 40  40    Vision Screening Comments: Eye exam every 3 mths; Last vision exam in Dec 2018 with Dr. George Ina   Exercise Activities and Dietary recommendations Current Exercise Habits: Structured exercise class, Type of exercise: Other - see comments(pulmonary rehab), Time (Minutes): > 60(76min), Frequency (Times/Week): 3, Weekly Exercise (Minutes/Week): 0, Intensity: Moderate, Exercise limited by: respiratory conditions(s)  Goals    . Follow up with Primary Care Provider     Starting 07/20/2017, I will continue to take medications as prescribed and to keep appointments with PCP as scheduled.        Fall Risk Fall Risk  07/20/2017 04/20/2017 07/07/2016 07/02/2015  Falls in  the past year? Yes No No No  Comment pt fell after stepping backwards off of a stool; seen in ER - - -  Number falls in past yr: 1 - - -  Injury with Fall? Yes - - -     Depression Screen PHQ 2/9 Scores 07/20/2017 04/20/2017 07/07/2016 07/02/2015  PHQ - 2 Score 0 0 0 0  PHQ- 9 Score 0 3 - -     Cognitive Function MMSE - Mini Mental State Exam 07/20/2017 07/07/2016  Orientation to time 5 5  Orientation to Place 5 5  Registration 3 3  Attention/ Calculation 0 0  Recall 3 3  Language- name 2 objects 0 0  Language- repeat 1 1  Language- follow 3 step command 3 3  Language- read & follow direction 0 0  Write a sentence 0 0  Copy design 0 0  Total score 20 20       PLEASE NOTE: A Mini-Cog screen was completed. Maximum score is 20. A value of 0 denotes this part of [place] MMSE was not completed or the patient failed this part of the Mini-Cog screening.   Mini-Cog Screening Orientation to Time - Max 5 pts Orientation to Place - Max 5 pts Registration - Max 3 pts Recall - Max 3 pts Language Repeat - Max 1 pts Language Follow 3 Step Command - Max 3 pts   Immunization History  Administered Date(s) Administered  . Influenza, High Dose Seasonal PF 03/27/2017  . Influenza,inj,Quad PF,6+ Mos 03/21/2015, 03/25/2016  . Pneumococcal Conjugate-13 05/23/2014  . Pneumococcal Polysaccharide-23 07/02/2015  . Td 06/23/2006    Screening Tests Health Maintenance  Topic Date Due  . DTaP/Tdap/Td (1 - Tdap) 06/22/2026 (Originally 06/24/2006)  . TETANUS/TDAP  06/22/2026 (Originally 06/23/2016)  . COLONOSCOPY  04/22/2019  . INFLUENZA VACCINE  Completed  . DEXA SCAN  Completed  . PNA vac Low Risk Adult  Completed      Plan:     I have personally reviewed, addressed, and noted the following in the patient's chart:  A. Medical and social history B. Use of alcohol, tobacco or illicit drugs  C. Current medications and supplements D. Functional ability and status E.  Nutritional status F.  Physical activity G. Advance directives H. List of other physicians I.  Hospitalizations, surgeries, and ER visits in previous 12 months J.  [place] to  include hearing, vision, cognitive, depression L. Referrals and appointments - none  In addition, I have reviewed and discussed with patient certain preventive protocols, quality metrics, and best practice recommendations. A written personalized care plan for preventive services as well as general preventive health recommendations were provided to patient.  See attached scanned questionnaire for additional information.   Signed,   Lindell Noe, MHA, BS, LPN Health Coach

## 2017-07-20 NOTE — Progress Notes (Signed)
Pre visit review using our clinic review tool, if applicable. No additional management support is needed unless otherwise documented below in the visit note. 

## 2017-07-20 NOTE — Telephone Encounter (Signed)
Please review for prior authorization. Thanks!

## 2017-07-20 NOTE — Telephone Encounter (Signed)
Margreta Journey from [employer] is calling for prior authorization of [place] will need to be contacted at [phone] Once outcome is received please fax to [phone]

## 2017-07-20 NOTE — Progress Notes (Signed)
PCP notes:   Health maintenance:  No gaps identified.  Abnormal screenings:   Fall risk - hx of accidental fall with minor injury and ER visit  Patient concerns:   None  Nurse concerns:  None  Next PCP appt:   08/04/17 @ 1500

## 2017-07-20 NOTE — Progress Notes (Signed)
Daily Session Note  Patient Details  Name: Elizabeth Hall MRN: [id number] Date of Birth: 1941/02/28 Referring Provider:     Pulmonary Rehab from 04/20/2017 in [facility] and Pulmonary Re[facility]  Referring Provider  [name] MD      Encounter Date: 07/20/2017  Check In: Session Check In - 07/20/17 1021      Check-In   Location  [facility]-Cardiac & Pulmonary Rehab    Staff Present  Nada Maclachlan, BA, ACSM CEP, Exercise Physiologist;Kelly Amedeo Plenty, BS, ACSM CEP, Exercise Physiologist;Duard Spiewak Flavia Shipper    Supervising physician immediately available to respond to emergencies  [name] immediately available ER MD    Physician(s)  Dr. Lamar Laundry and Mariea Clonts    Medication changes reported      No    Fall or balance concerns reported     No    Warm-up and Cool-down  Performed as group-led instruction    Resistance Training Performed  Yes    VAD Patient?  No      Pain Assessment   Currently in Pain?  No/denies          Social History   Tobacco Use  Smoking Status Former Smoker  . Packs/day: 0.50  . Years: 20.00  . Pack years: 10.00  . Types: Cigarettes  . Last attempt to quit: 02/04/1990  . Years since quitting: 27.4  Smokeless Tobacco Never Used    Goals Met:  Independence with exercise equipment Exercise tolerated well No report of cardiac concerns or symptoms Strength training completed today  Goals Unmet:  Not Applicable  Comments: Pt able to follow exercise prescription today without complaint.  Will continue to monitor for progression.   Dr. Emily Filbert is Medical Director for [facility] and [name].

## 2017-07-20 NOTE — Telephone Encounter (Signed)
S/w representative at [facility] who stated patient does not need prior authorization for Entresto according to plan for 2019. She will fax over a document stating this as well.

## 2017-07-21 NOTE — Progress Notes (Signed)
I reviewed health advisor's note, was available for consultation, and agree with documentation and plan.  

## 2017-07-21 NOTE — Telephone Encounter (Signed)
Contacted patient's pharmacy to let them know prior authorization not needed. She verbalized understanding and the medication went through.

## 2017-07-22 DIAGNOSIS — I509 Heart failure, unspecified: Secondary | ICD-10-CM

## 2017-07-22 DIAGNOSIS — I5022 Chronic systolic (congestive) heart failure: Secondary | ICD-10-CM | POA: Diagnosis not present

## 2017-07-22 DIAGNOSIS — I130 Hypertensive heart and chronic kidney disease with heart failure and stage 1 through stage 4 chronic kidney disease, or unspecified chronic kidney disease: Secondary | ICD-10-CM | POA: Diagnosis not present

## 2017-07-22 DIAGNOSIS — Z7951 Long term (current) use of inhaled steroids: Secondary | ICD-10-CM | POA: Diagnosis not present

## 2017-07-22 DIAGNOSIS — Z7982 Long term (current) use of aspirin: Secondary | ICD-10-CM | POA: Diagnosis not present

## 2017-07-22 DIAGNOSIS — N183 Chronic kidney disease, stage 3 (moderate): Secondary | ICD-10-CM | POA: Diagnosis not present

## 2017-07-22 DIAGNOSIS — Z79899 Other long term (current) drug therapy: Secondary | ICD-10-CM | POA: Diagnosis not present

## 2017-07-22 NOTE — Progress Notes (Signed)
Daily Session Note  Patient Details  Name: Elizabeth Hall MRN: [id number] Date of Birth: [birth year] Referring Provider:     Pulmonary Rehab from 04/20/2017 in [facility] Cardiac and Pulmonary Rehab  Referring Provider  [name] MD      Encounter Date: 07/22/2017  Check In: Session Check In - 07/22/17 1039      Check-In   Location  [facility]Cardiac & Pulmonary Re[facility]    Staff Present  Alberteen Sam, MA, RCEP, CCRP, Exercise Physiologist;Meredith Sherryll Burger, RN BSN;Davontay [place] Flavia Shipper    Supervising physician immediately available to respond to emergencies  [name] immediately available ER MD    Physician(s)  Dr. Jimmye Norman and Alfred Levins    Medication changes reported      No    Fall or balance concerns reported     No    Warm-up and Cool-down  Performed as group-led instruction    Resistance Training Performed  Yes    VAD Patient?  No      Pain Assessment   Currently in Pain?  No/denies          Social History   Tobacco Use  Smoking Status Former Smoker  . Packs/day: 0.50  . Years: 20.00  . Pack years: 10.00  . Types: Cigarettes  . Last attempt to quit: 02/04/1990  . Years since quitting: 27.4  Smokeless Tobacco Never Used    Goals Met:  Independence with exercise equipment Exercise tolerated well No report of cardiac concerns or symptoms Strength training completed today  Goals Unmet:  Not Applicable  Comments: Pt able to follow exercise prescription today without complaint.  Will continue to monitor for progression.   Dr. Emily Filbert is Medical Director for [facility] and [name].

## 2017-07-24 ENCOUNTER — Encounter: Payer: Medicare Other | Attending: Pulmonary Disease

## 2017-07-24 DIAGNOSIS — Z79899 Other long term (current) drug therapy: Secondary | ICD-10-CM | POA: Insufficient documentation

## 2017-07-24 DIAGNOSIS — Z86718 Personal history of other venous thrombosis and embolism: Secondary | ICD-10-CM | POA: Diagnosis not present

## 2017-07-24 DIAGNOSIS — I509 Heart failure, unspecified: Secondary | ICD-10-CM

## 2017-07-24 DIAGNOSIS — I482 Chronic atrial fibrillation: Secondary | ICD-10-CM | POA: Diagnosis not present

## 2017-07-24 DIAGNOSIS — Z95810 Presence of automatic (implantable) cardiac defibrillator: Secondary | ICD-10-CM | POA: Diagnosis not present

## 2017-07-24 DIAGNOSIS — Z7982 Long term (current) use of aspirin: Secondary | ICD-10-CM | POA: Insufficient documentation

## 2017-07-24 DIAGNOSIS — Z8672 Personal history of thrombophlebitis: Secondary | ICD-10-CM | POA: Insufficient documentation

## 2017-07-24 DIAGNOSIS — Z87891 Personal history of nicotine dependence: Secondary | ICD-10-CM | POA: Insufficient documentation

## 2017-07-24 DIAGNOSIS — I2510 Atherosclerotic heart disease of native coronary artery without angina pectoris: Secondary | ICD-10-CM | POA: Insufficient documentation

## 2017-07-24 DIAGNOSIS — I5022 Chronic systolic (congestive) heart failure: Secondary | ICD-10-CM | POA: Insufficient documentation

## 2017-07-24 DIAGNOSIS — Z85820 Personal history of malignant melanoma of skin: Secondary | ICD-10-CM | POA: Diagnosis not present

## 2017-07-24 DIAGNOSIS — H409 Unspecified glaucoma: Secondary | ICD-10-CM | POA: Diagnosis not present

## 2017-07-24 DIAGNOSIS — N183 Chronic kidney disease, stage 3 (moderate): Secondary | ICD-10-CM | POA: Insufficient documentation

## 2017-07-24 DIAGNOSIS — I428 Other cardiomyopathies: Secondary | ICD-10-CM | POA: Diagnosis not present

## 2017-07-24 DIAGNOSIS — Z7901 Long term (current) use of anticoagulants: Secondary | ICD-10-CM | POA: Diagnosis not present

## 2017-07-24 DIAGNOSIS — Z7951 Long term (current) use of inhaled steroids: Secondary | ICD-10-CM | POA: Diagnosis not present

## 2017-07-24 DIAGNOSIS — Z8673 Personal history of transient ischemic attack (TIA), and cerebral infarction without residual deficits: Secondary | ICD-10-CM | POA: Insufficient documentation

## 2017-07-24 DIAGNOSIS — I130 Hypertensive heart and chronic kidney disease with heart failure and stage 1 through stage 4 chronic kidney disease, or unspecified chronic kidney disease: Secondary | ICD-10-CM | POA: Diagnosis not present

## 2017-07-24 DIAGNOSIS — E785 Hyperlipidemia, unspecified: Secondary | ICD-10-CM | POA: Diagnosis not present

## 2017-07-24 DIAGNOSIS — Z953 Presence of xenogenic heart valve: Secondary | ICD-10-CM | POA: Diagnosis not present

## 2017-07-24 NOTE — Progress Notes (Signed)
Daily Session Note  Patient Details  Name: Elizabeth Hall MRN: [id number] Date of Birth: [birth year] Referring Provider:     Pulmonary Rehab from 04/20/2017 in [facility] Cardiac and Pulmonary Rehab  Referring Provider  [name] MD      Encounter Date: 07/24/2017  Check In: Session Check In - 07/24/17 1016      Check-In   Location  [facility]    Staff Present  Nada Maclachlan, BA, ACSM CEP, Exercise Physiologist;Meredith Sherryll Burger, RN BSN;Aniketh Huberty Flavia Shipper    Supervising physician immediately available to respond to emergencies  [name] immediately available ER MD    Physician(s)  Dr. Burlene Arnt and Clearnce Hasten    Medication changes reported      No    Fall or balance concerns reported     No    Warm-up and Cool-down  Performed as group-led instruction    Resistance Training Performed  Yes    VAD Patient?  No      Pain Assessment   Currently in Pain?  No/denies        Exercise Prescription Changes - 07/23/17 1100      Response to Exercise   Blood Pressure (Admit)  128/70    Blood Pressure (Exit)  124/64    Heart Rate (Admit)  75 bpm    Heart Rate (Exercise)  140 bpm    Heart Rate (Exit)  95 bpm    Oxygen Saturation (Admit)  100 %    Oxygen Saturation (Exercise)  95 %    Oxygen Saturation (Exit)  97 %    Rating of Perceived Exertion (Exercise)  12    Perceived Dyspnea (Exercise)  1    Symptoms  none    Duration  Continue with 45 min of aerobic exercise without signs/symptoms of physical distress.    Intensity  THRR unchanged      Progression   Progression  Continue to progress workloads to maintain intensity without signs/symptoms of physical distress.    Average METs  4      Resistance Training   Training Prescription  Yes    Weight  4 lb    Reps  10-15      Interval Training   Interval Training  No      Treadmill   MPH  3.3    Grade  3.5    Minutes  15    METs  5.12      NuStep   Level  7    SPM  77    Minutes  15    METs   2.9      Home Exercise Plan   Plans to continue exercise at  Home (comment) walking at home.    Frequency  Add 2 additional days to program exercise sessions.    Initial Home Exercises Provided  05/01/17       Social History   Tobacco Use  Smoking Status Former Smoker  . Packs/day: 0.50  . Years: 20.00  . Pack years: 10.00  . Types: Cigarettes  . Last attempt to quit: 02/04/1990  . Years since quitting: 27.4  Smokeless Tobacco Never Used    Goals Met:  Independence with exercise equipment Exercise tolerated well No report of cardiac concerns or symptoms Strength training completed today  Goals Unmet:  Not Applicable  Comments: Pt able to follow exercise prescription today without complaint.  Will continue to monitor for progression.   Dr. Emily Filbert is Medical Director for [place]  and [name].

## 2017-07-27 ENCOUNTER — Telehealth: Payer: Self-pay | Admitting: Cardiovascular Disease

## 2017-07-27 ENCOUNTER — Encounter: Payer: Self-pay | Admitting: Family Medicine

## 2017-07-27 VITALS — Ht 63.5 in | Wt 149.9 lb

## 2017-07-27 DIAGNOSIS — I5022 Chronic systolic (congestive) heart failure: Secondary | ICD-10-CM | POA: Diagnosis not present

## 2017-07-27 DIAGNOSIS — I130 Hypertensive heart and chronic kidney disease with heart failure and stage 1 through stage 4 chronic kidney disease, or unspecified chronic kidney disease: Secondary | ICD-10-CM | POA: Diagnosis not present

## 2017-07-27 DIAGNOSIS — Z79899 Other long term (current) drug therapy: Secondary | ICD-10-CM | POA: Diagnosis not present

## 2017-07-27 DIAGNOSIS — I509 Heart failure, unspecified: Secondary | ICD-10-CM

## 2017-07-27 DIAGNOSIS — N183 Chronic kidney disease, stage 3 (moderate): Secondary | ICD-10-CM | POA: Diagnosis not present

## 2017-07-27 DIAGNOSIS — Z7982 Long term (current) use of aspirin: Secondary | ICD-10-CM | POA: Diagnosis not present

## 2017-07-27 DIAGNOSIS — Z7951 Long term (current) use of inhaled steroids: Secondary | ICD-10-CM | POA: Diagnosis not present

## 2017-07-27 NOTE — Telephone Encounter (Signed)
Pt calling stating she need [place] to do a PA on her Elizabeth Hall please send to [employer]   Please fax this to [phone]

## 2017-07-27 NOTE — Progress Notes (Signed)
Daily Session Note  Patient Details  Name: Elizabeth Hall MRN: [id number] Date of Birth: [birth year] Referring Provider:     Pulmonary Rehab from 04/20/2017 in [facility] Cardiac and Pulmonary Rehab  Referring Provider  [name] MD      Encounter Date: 07/27/2017  Check In: Session Check In - 07/27/17 0952      Check-In   Location  [facility]    Staff Present  Justin Mend RCP,RRT,BSRT;Amanda Oletta Darter, BA, ACSM CEP, Exercise Physiologist;Kelly Amedeo Plenty, BS, ACSM CEP, Exercise Physiologist    Supervising physician immediately available to respond to emergencies  [name] immediately available ER MD    Physician(s)  Dr. Quentin Cornwall and Jimmye Norman    Medication changes reported      No    Fall or balance concerns reported     No    Warm-up and Cool-down  Performed as group-led instruction    Resistance Training Performed  Yes    VAD Patient?  No      Pain Assessment   Currently in Pain?  No/denies          Social History   Tobacco Use  Smoking Status Former Smoker  . Packs/day: 0.50  . Years: 20.00  . Pack years: 10.00  . Types: Cigarettes  . Last attempt to quit: 02/04/1990  . Years since quitting: 27.4  Smokeless Tobacco Never Used    Goals Met:  Independence with exercise equipment Exercise tolerated well No report of cardiac concerns or symptoms Strength training completed today  Goals Unmet:  Not Applicable  Comments: Pt able to follow exercise prescription today without complaint.  Will continue to monitor for progression. [place] Name 04/20/17 1553 07/27/17 1107       6 Minute Walk   Phase  Initial  Discharge    Distance  1260 feet  1510 feet    Distance % Change  -  20 %    Distance Feet Change  -  250 ft    Walk Time  6 minutes  6 minutes    # of Rest Breaks  0  0    MPH  2.39  2.86    METS  2.6  3.45    RPE  11  11    Perceived Dyspnea   1.5  1    VO2 Peak  9.09  12.06    Symptoms  No  No    Resting HR  74 bpm   75 bpm    Resting BP  122/70  110/64    Resting Oxygen Saturation   98 %  99 %    Exercise Oxygen Saturation  during 6 min walk  93 %  89 %    Max Ex. HR  106 bpm  130 bpm    Max Ex. BP  126/60  142/80    2 Minute Post BP  122/64  -      Interval HR   1 Minute HR  104  85    2 Minute HR  101  128    3 Minute HR  91  130    4 Minute HR  86  126    5 Minute HR  98  -    6 Minute HR  106  101    2 Minute Post HR  76  -    Interval Heart Rate?  Yes  Yes      Interval Oxygen   Interval  Oxygen?  Yes  Yes    Baseline Oxygen Saturation %  98 %  99 %    1 Minute Oxygen Saturation %  94 %  89 %    1 Minute Liters of Oxygen  0 L Room Air  0 L    2 Minute Oxygen Saturation %  94 %  92 %    2 Minute Liters of Oxygen  0 L  0 L    3 Minute Oxygen Saturation %  95 %  91 %    3 Minute Liters of Oxygen  0 L  0 L    4 Minute Oxygen Saturation %  94 %  93 %    4 Minute Liters of Oxygen  0 L  0 L    5 Minute Oxygen Saturation %  94 %  -    5 Minute Liters of Oxygen  0 L  0 L    6 Minute Oxygen Saturation %  93 %  99 %    6 Minute Liters of Oxygen  0 L  0 L    2 Minute Post Oxygen Saturation %  100 %  -    2 Minute Post Liters of Oxygen  0 L  -         Dr. Emily Filbert is Medical Director for [facility] and [name].

## 2017-07-28 NOTE — Telephone Encounter (Signed)
S/w patient. She states that [employer] needed prior authorization for [employer].   S/w [employer] and they said need prior authorization and to send fax to [phone] or call back and give a verbal. Prior Josem Kaufmann needs to be obtained from [employer].

## 2017-07-28 NOTE — Telephone Encounter (Signed)
S/w Youth worker. No prior authorization needed for Entresto.    Called [employer] back and let them know this information. Representative verbalized understanding. No further action needed at this time.

## 2017-07-28 NOTE — Telephone Encounter (Signed)
No answer. Left detailed message with patient, ok per DPR, with information that no prior auth was needed and that I notified the [employer].

## 2017-07-29 DIAGNOSIS — N183 Chronic kidney disease, stage 3 (moderate): Secondary | ICD-10-CM | POA: Diagnosis not present

## 2017-07-29 DIAGNOSIS — Z7951 Long term (current) use of inhaled steroids: Secondary | ICD-10-CM | POA: Diagnosis not present

## 2017-07-29 DIAGNOSIS — Z7982 Long term (current) use of aspirin: Secondary | ICD-10-CM | POA: Diagnosis not present

## 2017-07-29 DIAGNOSIS — Z79899 Other long term (current) drug therapy: Secondary | ICD-10-CM | POA: Diagnosis not present

## 2017-07-29 DIAGNOSIS — I5022 Chronic systolic (congestive) heart failure: Secondary | ICD-10-CM | POA: Diagnosis not present

## 2017-07-29 DIAGNOSIS — I130 Hypertensive heart and chronic kidney disease with heart failure and stage 1 through stage 4 chronic kidney disease, or unspecified chronic kidney disease: Secondary | ICD-10-CM | POA: Diagnosis not present

## 2017-07-29 DIAGNOSIS — I509 Heart failure, unspecified: Secondary | ICD-10-CM

## 2017-07-29 NOTE — Progress Notes (Signed)
Daily Session Note  Patient Details  Name: Rache Klimaszewski MRN: [id number] Date of Birth: [birth year] Referring Provider:     Pulmonary Rehab from 04/20/2017 in [facility] Pul[facility] Rehab  Referring Provider  [name] MD      Encounter Date: 07/29/2017  Check In: Session Check In - 07/29/17 1022      Check-In   Location  [facility]-Cardiac & Pulmonary Rehab    Staff Present  Alberteen Sam, MA, RCEP, CCRP, Exercise Physiologist;Amanda Oletta Darter, BA, ACSM CEP, Exercise Physiologist;Patryck Kilgore Flavia Shipper    Supervising physician immediately available to respond to emergencies  [name] immediately available ER MD    Physician(s)  Jimmye Norman and Lucita Lora    Medication changes reported      No    Fall or balance concerns reported     No    Warm-up and Cool-down  Performed as group-led Higher education careers adviser Performed  Yes    VAD Patient?  No      Pain Assessment   Currently in Pain?  No/denies    Multiple Pain Sites  No          Social History   Tobacco Use  Smoking Status Former Smoker  . Packs/day: 0.50  . Years: 20.00  . Pack years: 10.00  . Types: Cigarettes  . Last attempt to quit: 02/04/1990  . Years since quitting: 27.4  Smokeless Tobacco Never Used    Goals Met:  Independence with exercise equipment Exercise tolerated well No report of cardiac concerns or symptoms Strength training completed today  Goals Unmet:  Not Applicable  Comments: Pt able to follow exercise prescription today without complaint.  Will continue to monitor for progression.    Dr. Emily Filbert is Medical Director for [place] and [name].

## 2017-07-31 DIAGNOSIS — I130 Hypertensive heart and chronic kidney disease with heart failure and stage 1 through stage 4 chronic kidney disease, or unspecified chronic kidney disease: Secondary | ICD-10-CM | POA: Diagnosis not present

## 2017-07-31 DIAGNOSIS — Z79899 Other long term (current) drug therapy: Secondary | ICD-10-CM | POA: Diagnosis not present

## 2017-07-31 DIAGNOSIS — Z7982 Long term (current) use of aspirin: Secondary | ICD-10-CM | POA: Diagnosis not present

## 2017-07-31 DIAGNOSIS — N183 Chronic kidney disease, stage 3 (moderate): Secondary | ICD-10-CM | POA: Diagnosis not present

## 2017-07-31 DIAGNOSIS — Z7951 Long term (current) use of inhaled steroids: Secondary | ICD-10-CM | POA: Diagnosis not present

## 2017-07-31 DIAGNOSIS — I5022 Chronic systolic (congestive) heart failure: Secondary | ICD-10-CM | POA: Diagnosis not present

## 2017-07-31 DIAGNOSIS — I509 Heart failure, unspecified: Secondary | ICD-10-CM

## 2017-07-31 NOTE — Progress Notes (Signed)
Daily Session Note  Patient Details  Name: Elizabeth Hall MRN: [id number] Date of Birth: [birth year] Referring Provider:     Pulmonary Rehab from 04/20/2017 in [facility] Cardiac and Pulmonary Rehab  Referring Provider  [name] MD      Encounter Date: 07/31/2017  Check In: Session Check In - 07/31/17 1017      Check-In   Location  [facility]    Staff Present  Renita Papa, RN Vickki Hearing, BA, ACSM CEP, Exercise Physiologist;Zyion Leidner Flavia Shipper    Supervising physician immediately available to respond to emergencies  [name] immediately available ER MD    Physician(s)  Dr. Cinda Quest and Reita Cliche    Medication changes reported      No    Fall or balance concerns reported     No    Warm-up and Cool-down  Performed as group-led instruction    Resistance Training Performed  Yes    VAD Patient?  No      Pain Assessment   Currently in Pain?  No/denies          Social History   Tobacco Use  Smoking Status Former Smoker  . Packs/day: 0.50  . Years: 20.00  . Pack years: 10.00  . Types: Cigarettes  . Last attempt to quit: 02/04/1990  . Years since quitting: 27.5  Smokeless Tobacco Never Used    Goals Met:  Independence with exercise equipment Exercise tolerated well No report of cardiac concerns or symptoms Strength training completed today  Goals Unmet:  Not Applicable  Comments: Pt able to follow exercise prescription today without complaint.  Will continue to monitor for progression.   Dr. Emily Filbert is Medical Director for [place] and [name].

## 2017-08-03 ENCOUNTER — Ambulatory Visit (INDEPENDENT_AMBULATORY_CARE_PROVIDER_SITE_OTHER): Payer: Medicare Other

## 2017-08-03 DIAGNOSIS — Z953 Presence of xenogenic heart valve: Secondary | ICD-10-CM | POA: Diagnosis not present

## 2017-08-03 DIAGNOSIS — I509 Heart failure, unspecified: Secondary | ICD-10-CM

## 2017-08-03 DIAGNOSIS — Z79899 Other long term (current) drug therapy: Secondary | ICD-10-CM | POA: Diagnosis not present

## 2017-08-03 DIAGNOSIS — Z5181 Encounter for therapeutic drug level monitoring: Secondary | ICD-10-CM | POA: Diagnosis not present

## 2017-08-03 DIAGNOSIS — I130 Hypertensive heart and chronic kidney disease with heart failure and stage 1 through stage 4 chronic kidney disease, or unspecified chronic kidney disease: Secondary | ICD-10-CM | POA: Diagnosis not present

## 2017-08-03 DIAGNOSIS — I482 Chronic atrial fibrillation, unspecified: Secondary | ICD-10-CM

## 2017-08-03 DIAGNOSIS — Z7951 Long term (current) use of inhaled steroids: Secondary | ICD-10-CM | POA: Diagnosis not present

## 2017-08-03 DIAGNOSIS — N183 Chronic kidney disease, stage 3 (moderate): Secondary | ICD-10-CM | POA: Diagnosis not present

## 2017-08-03 DIAGNOSIS — I4891 Unspecified atrial fibrillation: Secondary | ICD-10-CM

## 2017-08-03 DIAGNOSIS — Z7982 Long term (current) use of aspirin: Secondary | ICD-10-CM | POA: Diagnosis not present

## 2017-08-03 DIAGNOSIS — I5022 Chronic systolic (congestive) heart failure: Secondary | ICD-10-CM | POA: Diagnosis not present

## 2017-08-03 LAB — POCT INR: INR: 2.3

## 2017-08-03 NOTE — Patient Instructions (Signed)
Please start new dosage of 1 tablet daily.   Recheck in 2 weeks.

## 2017-08-03 NOTE — Progress Notes (Signed)
Daily Session Note  Patient Details  Name: Natalyah Cummiskey MRN: [id number] Date of Birth: 09-Jul-1940 Referring Provider:     Pulmonary Rehab from 04/20/2017 in [facility] Cardiac and Pulmonary Rehab  Referring Provider  [name] MD      Encounter Date: 08/03/2017  Check In: Session Check In - 08/03/17 1022      Check-In   Location  [facility]Cardiac [facility] Pulmonary Rehab    Staff Present  Nada Maclachlan, BA, ACSM CEP, Exercise Physiologist;Kelly Amedeo Plenty, BS, ACSM CEP, Exercise Physiologist;Hendy Brindle Flavia Shipper    Supervising physician immediately available to respond to emergencies  [name] immediately available ER MD    Physician(s)  Dr. Corky Downs and Rifenbark    Medication changes reported      No    Fall or balance concerns reported     No    Warm-up and Cool-down  Performed as group-led instruction    Resistance Training Performed  Yes    VAD Patient?  No      Pain Assessment   Currently in Pain?  No/denies          Social History   Tobacco Use  Smoking Status Former Smoker  . Packs/day: 0.50  . Years: 20.00  . Pack years: 10.00  . Types: Cigarettes  . Last attempt to quit: 02/04/1990  . Years since quitting: 27.5  Smokeless Tobacco Never Used    Goals Met:  Independence with exercise equipment Exercise tolerated well No report of cardiac concerns or symptoms Strength training completed today  Goals Unmet:  Not Applicable  Comments: Pt able to follow exercise prescription today without complaint.  Will continue to monitor for progression.   Dr. Emily Filbert is Medical Director for [place] and [name].

## 2017-08-03 NOTE — Progress Notes (Signed)
Pulmonary Individual Treatment Plan  Patient Details  Name: Elizabeth Hall MRN: [id number] Date of Birth: 01/03/1941 Referring Provider:     Pulmonary Rehab from 04/20/2017 in [facility] Cardiac and Pulmonary Rehab  Referring Provider  [name] MD      Initial Encounter Date:    Pulmonary Rehab from 04/20/2017 in [facility] Cardiac and Pulmonary Rehab  Date  04/20/17  Referring Provider  [name] MD      Visit Diagnosis: Chronic congestive heart failure, unspecified heart failure type [facility])  Patient's Home Medications on Admission:  Current Outpatient Medications:  .  acetaminophen (TYLENOL) 500 MG tablet, Take 500-1,000 mg by mouth every 6 (six) hours as needed (for headaches/pain.)., Disp: , Rfl:  .  aspirin EC 81 MG tablet, Take 81 mg by mouth daily., Disp: , Rfl:  .  bumetanide (BUMEX) 2 MG tablet, TAKE 1 TABLET (2 MG TOTAL) BY MOUTH DAILY., Disp: 90 tablet, Rfl: 1 .  carvedilol (COREG) 12.5 MG tablet, Take 1 tablet (12.5 mg total) by mouth 2 (two) times daily with a meal., Disp: 180 tablet, Rfl: 3 .  estradiol (ESTRACE) 1 MG tablet, Take 0.5 mg by mouth 2 (two) times a week. , Disp: , Rfl:  .  latanoprost (XALATAN) 0.005 % ophthalmic solution, Place 1 drop into both eyes at bedtime., Disp: , Rfl: 99 .  Magnesium 250 MG TABS, Take 250 mg by mouth daily., Disp: , Rfl:  .  meclizine (ANTIVERT) 25 MG tablet, Take 1 tablet (25 mg total) by mouth 2 (two) times daily as needed for dizziness., Disp: 180 tablet, Rfl: 0 .  Polyethyl Glycol-Propyl Glycol (LUBRICANT EYE DROPS) 0.4-0.3 % SOLN, Place 1 drop into both eyes 3 (three) times daily as needed (for dry eyes.)., Disp: , Rfl:  .  sacubitril-valsartan (ENTRESTO) 24-26 MG, Take 1 tablet 2 (two) times daily by mouth., Disp: 180 tablet, Rfl: 3 .  spironolactone (ALDACTONE) 25 MG tablet, TAKE 1/2 TABLET(12.5 MG) BY MOUTH DAILY, Disp: 30 tablet, Rfl: 3 .  vitamin E 400 UNIT capsule, Take 400 Units by mouth daily., Disp: , Rfl:  .   warfarin (COUMADIN) 3 MG tablet, Take as directed by [facility], Disp: 90 tablet, Rfl: 1  Past Medical History: Past Medical History:  Diagnosis Date  . Bleeding of eye   . Chronic systolic heart failure ([place])    a. 11/2016 Echo: EF 35-40%, mild MR w/ nl fxning bioprosthesis, mod dil LA.  . CKD (chronic kidney disease) stage 3, GFR 30-59 ml/min (HCC)    baseline Cr 1.5-2; prior saw nephrologist thought hypertensive nephropathy/renovascular disease  . CVA (cerebral infarction)    a. 2014 occipital lobe - some vision loss when coumadin held and not bridged  . Dyslipidemia   . Glaucoma   . H. pylori infection    treated  . H/O mitral valve replacement    a. 05/2012 s/p 41m Carpentier Edwards pericardial tissue valve.  .Marland KitchenHx of rheumatic fever   . Hypertension   . Melanoma ([place] 2006   L shoulder  . NICM (nonischemic cardiomyopathy) ([place]    a. 08/2013 s/p Biotronik Ilesto 7 HF1 BiV ICD (ser# [id number];  b. 11/2016 Echo: EF 35-40%.  . Non-obstructive CAD    a. s/p MI 2001;  b. 12/2016 Cath: LM 20ost, OM2 60, RCA 30p/d, EF 35-45%. Nl R heart filling pressures.  . Permanent atrial fibrillation ([place]    a. 04/2012 s/p AVN & PPM placement (later upgraded to BiV ICD).  .Marland KitchenPhlebitis   .  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 06/2012 h/o acute ischemia due to thromboembolism radial and ulnar arteries when coumadin held s/p atherectomy - needs bridging if off coumadin  . Tricuspid regurgitation    a. 05/2012 s/p TV annuloplasty @ time of MVR.    Tobacco Use: Social History   Tobacco Use  Smoking Status Former Smoker  . Packs/day: 0.50  . Years: 20.00  . Pack years: 10.00  . Types: Cigarettes  . Last attempt to quit: 02/04/1990  . Years since quitting: 27.5  Smokeless Tobacco Never Used    Labs: Recent Chemical engineer    Labs for ITP Cardiac and Pulmonary Rehab Latest Ref Rng & Units 06/12/2014 06/27/2015 07/07/2016 07/20/2017   Cholestrol 0 - 200 mg/dL 192 160 -  170   LDLCALC 0 - 99 mg/dL 104 96 - 99   HDL >39.00 mg/dL 74(A) 51.60 - 54.70   Trlycerides 0.0 - 149.0 mg/dL 70 62.0 - 81.0   Hemoglobin A1c 4.6 - 6.5 % - 6.2 6.1 6.2       Pulmonary Assessment Scores: Pulmonary Assessment Scores    Row Name 04/20/17 1453 06/15/17 1142 07/29/17 1422     ADL UCSD   ADL Phase  Entry  Mid  Exit   SOB Score total  42  32  17   Rest  0  0  0   Walk  1  0  0   Stairs  '4  4  3   '$ Bath  1  1  0   Dress  2  0  0   Shop  3  2  0     CAT Score   CAT Score  12  -  8     mMRC Score   mMRC Score  3  -  -      Pulmonary Function Assessment: Pulmonary Function Assessment - 04/20/17 1457      Breath   Bilateral Breath Sounds  Clear    Shortness of Breath  No;Limiting activity Her shortness of breath make it more of an annoyance for her       Exercise Target Goals:    Exercise Program Goal: Individual exercise prescription set using results from initial 6 min walk test and THRR while considering  patient's activity barriers and safety.    Exercise Prescription Goal: Initial exercise prescription builds to 30-45 minutes a day of aerobic activity, 2-3 days per week.  Home exercise guidelines will be given to patient during program as part of exercise prescription that the participant will acknowledge.  Activity Barriers & Risk Stratification: Activity Barriers & Cardiac Risk Stratification - 04/20/17 1555      Activity Barriers & Cardiac Risk Stratification   Activity Barriers  Muscular Weakness;Shortness of Breath;Deconditioning;Balance Concerns;Decreased Ventricular Function       6 Minute Walk: 6 Minute Walk    Row Name 04/20/17 1553 07/27/17 1107       6 Minute Walk   Phase  Initial  Discharge    Distance  1260 feet  1510 feet    Distance % Change  -  20 %    Distance Feet Change  -  250 ft    Walk Time  6 minutes  6 minutes    # of Rest Breaks  0  0    MPH  2.39  2.86    METS  2.6  3.45    RPE  11  11  Perceived Dyspnea    1.5  1    VO2 Peak  9.09  12.06    Symptoms  No  No    Resting HR  74 bpm  75 bpm    Resting BP  122/70  110/64    Resting Oxygen Saturation   98 %  99 %    Exercise Oxygen Saturation  during 6 min walk  93 %  89 %    Max Ex. HR  106 bpm  130 bpm    Max Ex. BP  126/60  142/80    2 Minute Post BP  122/64  -      Interval HR   1 Minute HR  104  85    2 Minute HR  101  128    3 Minute HR  91  130    4 Minute HR  86  126    5 Minute HR  98  -    6 Minute HR  106  101    2 Minute Post HR  76  -    Interval Heart Rate?  Yes  Yes      Interval Oxygen   Interval Oxygen?  Yes  Yes    Baseline Oxygen Saturation %  98 %  99 %    1 Minute Oxygen Saturation %  94 %  89 %    1 Minute Liters of Oxygen  0 L Room Air  0 L    2 Minute Oxygen Saturation %  94 %  92 %    2 Minute Liters of Oxygen  0 L  0 L    3 Minute Oxygen Saturation %  95 %  91 %    3 Minute Liters of Oxygen  0 L  0 L    4 Minute Oxygen Saturation %  94 %  93 %    4 Minute Liters of Oxygen  0 L  0 L    5 Minute Oxygen Saturation %  94 %  -    5 Minute Liters of Oxygen  0 L  0 L    6 Minute Oxygen Saturation %  93 %  99 %    6 Minute Liters of Oxygen  0 L  0 L    2 Minute Post Oxygen Saturation %  100 %  -    2 Minute Post Liters of Oxygen  0 L  -      Oxygen Initial Assessment: Oxygen Initial Assessment - 04/20/17 1504      Home Oxygen   Home Oxygen Device  None    Sleep Oxygen Prescription  None    Home Exercise Oxygen Prescription  None    Home at Rest Exercise Oxygen Prescription  None      Initial 6 min Walk   Oxygen Used  None      Program Oxygen Prescription   Program Oxygen Prescription  None      Intervention   Short Term Goals  To learn and understand importance of maintaining oxygen saturations>88%;To learn and demonstrate proper pursed lip breathing techniques or other breathing techniques.;To learn and understand importance of monitoring SPO2 with pulse oximeter and demonstrate accurate use of the  pulse oximeter.    [place]  Term Goals  Exhibits proper breathing techniques, such as pursed lip breathing or other method taught during program session;Verbalizes importance of monitoring SPO2 with pulse oximeter and return demonstration;Maintenance of O2 saturations>88%  Oxygen Re-Evaluation: Oxygen Re-Evaluation    Row Name 04/27/17 1028 05/18/17 1655 06/15/17 1113 07/17/17 1427       Program Oxygen Prescription   Program Oxygen Prescription  None  None  None  None      Home Oxygen   Home Oxygen Device  None  None  None  None    Sleep Oxygen Prescription  None  None  None  None    Home Exercise Oxygen Prescription  None  None  None  None    Home at Rest Exercise Oxygen Prescription  None  None  None  None      Goals/Expected Outcomes   Short Term Goals  To learn and understand importance of maintaining oxygen saturations>88%;To learn and demonstrate proper pursed lip breathing techniques or other breathing techniques.;To learn and understand importance of monitoring SPO2 with pulse oximeter and demonstrate accurate use of the pulse oximeter.  To learn and understand importance of maintaining oxygen saturations>88%;To learn and demonstrate proper pursed lip breathing techniques or other breathing techniques.;To learn and understand importance of monitoring SPO2 with pulse oximeter and demonstrate accurate use of the pulse oximeter.  To learn and understand importance of maintaining oxygen saturations>88%;To learn and demonstrate proper pursed lip breathing techniques or other breathing techniques.;To learn and understand importance of monitoring SPO2 with pulse oximeter and demonstrate accurate use of the pulse oximeter.  To learn and understand importance of maintaining oxygen saturations>88%;To learn and demonstrate proper pursed lip breathing techniques or other breathing techniques.;To learn and understand importance of monitoring SPO2 with pulse oximeter and demonstrate accurate use  of the pulse oximeter.    [place]  Term Goals  Exhibits proper breathing techniques, such as pursed lip breathing or other method taught during program session;Maintenance of O2 saturations>88%;Verbalizes importance of monitoring SPO2 with pulse oximeter and return demonstration  Exhibits proper breathing techniques, such as pursed lip breathing or other method taught during program session;Maintenance of O2 saturations>88%;Verbalizes importance of monitoring SPO2 with pulse oximeter and return demonstration  Exhibits proper breathing techniques, such as pursed lip breathing or other method taught during program session;Maintenance of O2 saturations>88%;Verbalizes importance of monitoring SPO2 with pulse oximeter and return demonstration  Exhibits proper breathing techniques, such as pursed lip breathing or other method taught during program session;Maintenance of O2 saturations>88%;Verbalizes importance of monitoring SPO2 with pulse oximeter and return demonstration    Comments  Reviewed PLB technique with pt.  Talked about how it work and it's important to maintaining his exercise saturations.    Elizabeth Hall does not wear oxygen at home and does not wear a CPAP. She does not use inhalers or nebulizers. Elizabeth Hall needs to work on her PLB when she gets short of breath. She has a pulse oximeter at home and checks her saturations. Elizabeth Hall states she stays around 97-98 percent.  Elizabeth Hall is using her pursed lip breathing when she remembers and notes that it really does make a difference in controlling her breathing. She continues to use her pulse oximeter to monitor her saturations.  She contiues to manage her disease and has found that her breathing is getting better as she does more exercise.    Elizabeth Hall monitors her oxygen at home and is always in the mid and high 90's. Her oxygen while exercising has been within normal limits.     Goals/Expected Outcomes  Short: Become more profiecient at using PLB.   [place]: Become independent at using  PLB.  Short: decrease WOB. [place]: be proficient with PLB to decrease  WOB   Short: [place] to use pursed lip breathing more routinely .  [place]: Use pursed lip breathing without having to reminder herself to do it.   Short: increase exercise loads an monitor oxygen. Mable: increase loads without desaturation       Oxygen Discharge (Final Oxygen Re-Evaluation): Oxygen Re-Evaluation - 07/17/17 1427      Program Oxygen Prescription   Program Oxygen Prescription  None      Home Oxygen   Home Oxygen Device  None    Sleep Oxygen Prescription  None    Home Exercise Oxygen Prescription  None    Home at Rest Exercise Oxygen Prescription  None      Goals/Expected Outcomes   Short Term Goals  To learn and understand importance of maintaining oxygen saturations>88%;To learn and demonstrate proper pursed lip breathing techniques or other breathing techniques.;To learn and understand importance of monitoring SPO2 with pulse oximeter and demonstrate accurate use of the pulse oximeter.    Kyllo  Term Goals  Exhibits proper breathing techniques, such as pursed lip breathing or other method taught during program session;Maintenance of O2 saturations>88%;Verbalizes importance of monitoring SPO2 with pulse oximeter and return demonstration    Comments  Elizabeth Hall monitors her oxygen at home and is always in the mid and high 90's. Her oxygen while exercising has been within normal limits.     Goals/Expected Outcomes  Short: increase exercise loads an monitor oxygen. [place]: increase loads without desaturation       Initial Exercise Prescription: Initial Exercise Prescription - 04/20/17 1500      Date of Initial Exercise RX and Referring Provider   Date  04/20/17    Referring Provider  [name] MD      Treadmill   MPH  2.1    Grade  0    Minutes  15    METs  2.61      NuStep   Level  2    SPM  80    Minutes  15    METs  2.6      REL-XR   Level  1    Speed  50    Minutes  15    METs  2.6       Prescription Details   Frequency (times per week)  3    Duration  Progress to 45 minutes of aerobic exercise without signs/symptoms of physical distress      Intensity   THRR 40-80% of Max Heartrate  103-132    Ratings of Perceived Exertion  11-13    Perceived Dyspnea  0-4      Progression   Progression  Continue to progress workloads to maintain intensity without signs/symptoms of physical distress.      Resistance Training   Training Prescription  Yes    Weight  3 lbs    Reps  10-15       Perform Capillary Blood Glucose checks as needed.  Exercise Prescription Changes: Exercise Prescription Changes    Row Name 04/20/17 1500 04/29/17 1200 05/01/17 1000 05/13/17 1000 05/27/17 1200     Response to Exercise   Blood Pressure (Admit)  122/70  126/64  -  108/62  126/70   Blood Pressure (Exercise)  126/60  -  -  134/64  -   Blood Pressure (Exit)  122/64  114/56  -  102/60  122/74   Heart Rate (Admit)  74 bpm  77 bpm  -  98 bpm  77 bpm  Heart Rate (Exercise)  106 bpm  100 bpm  -  106 bpm  122 bpm   Heart Rate (Exit)  76 bpm  75 bpm  -  78 bpm  73 bpm   Oxygen Saturation (Admit)  98 %  98 %  -  100 %  98 %   Oxygen Saturation (Exercise)  93 %  99 %  -  100 %  97 %   Oxygen Saturation (Exit)  100 %  99 %  -  100 %  97 %   Rating of Perceived Exertion (Exercise)  11  10  -  13  14   Perceived Dyspnea (Exercise)  1.5  1  -  1  2   Symptoms  none  none  -  none  none   Comments  walk test results  -  -  -  -   Duration  -  Progress to 45 minutes of aerobic exercise without signs/symptoms of physical distress  -  Progress to 45 minutes of aerobic exercise without signs/symptoms of physical distress  Continue with 45 min of aerobic exercise without signs/symptoms of physical distress.   Intensity  -  THRR unchanged  -  THRR unchanged  THRR unchanged     Progression   Progression  -  Continue to progress workloads to maintain intensity without signs/symptoms of physical distress.  -   Continue to progress workloads to maintain intensity without signs/symptoms of physical distress.  Continue to progress workloads to maintain intensity without signs/symptoms of physical distress.   Average METs  -  3.3  -  3.4  2.7     Resistance Training   Training Prescription  -  Yes  -  Yes  Yes   Weight  -  3 lbs  -  3 lbs  3 lb   Reps  -  10-15  -  10-15  10-15     Interval Training   Interval Training  -  No  -  No  -     Treadmill   MPH  -  -  -  -  3   Grade  -  -  -  -  0   Minutes  -  -  -  -  15   METs  -  -  -  -  3.3     NuStep   Level  -  2  -  4  -   SPM  -  91  -  86  -   Minutes  -  15  -  15  -   METs  -  2.7  -  3.2  -     Arm Ergometer   Level  -  -  -  -  1.4   Minutes  -  -  -  -  15   METs  -  -  -  -  2     REL-XR   Level  -  1  -  1  -   Speed  -  50  -  54  -   Minutes  -  15  -  15  -   METs  -  3.9  -  3.6  -     Home Exercise Plan   Plans to continue exercise at  -  -  Home (comment) walking at home.  Home (comment) walking at home.  Home (comment) walking  at home.   Frequency  -  -  Add 2 additional days to program exercise sessions.  Add 2 additional days to program exercise sessions.  Add 2 additional days to program exercise sessions.   Initial Home Exercises Provided  -  -  05/01/17  05/01/17  05/01/17   Row Name 06/10/17 1200 06/26/17 1100 07/08/17 1500 07/23/17 1100       Response to Exercise   Blood Pressure (Admit)  102/62  118/60  126/70  128/70    Blood Pressure (Exit)  126/70  112/60  124/60  124/64    Heart Rate (Admit)  77 bpm  83 bpm  75 bpm  75 bpm    Heart Rate (Exercise)  136 bpm  140 bpm  103 bpm  140 bpm    Heart Rate (Exit)  75 bpm  76 bpm  75 bpm  95 bpm    Oxygen Saturation (Admit)  98 %  98 %  98 %  100 %    Oxygen Saturation (Exercise)  95 %  98 %  97 %  95 %    Oxygen Saturation (Exit)  98 %  96 %  95 %  97 %    Rating of Perceived Exertion (Exercise)  '13  13  13  12    '$ Perceived Dyspnea (Exercise)  '1  1  1  1     '$ Symptoms  none  none  none  none    Duration  Continue with 45 min of aerobic exercise without signs/symptoms of physical distress.  Continue with 45 min of aerobic exercise without signs/symptoms of physical distress.  Continue with 45 min of aerobic exercise without signs/symptoms of physical distress.  Continue with 45 min of aerobic exercise without signs/symptoms of physical distress.    Intensity  THRR unchanged  THRR unchanged  THRR unchanged  THRR unchanged      Progression   Progression  Continue to progress workloads to maintain intensity without signs/symptoms of physical distress.  Continue to progress workloads to maintain intensity without signs/symptoms of physical distress.  Continue to progress workloads to maintain intensity without signs/symptoms of physical distress.  Continue to progress workloads to maintain intensity without signs/symptoms of physical distress.    Average METs  2.86  3.76  2.95  4      Resistance Training   Training Prescription  Yes  -  Yes  Yes    Weight  3 lb  -  4 lb  4 lb    Reps  10-15  -  10-15  10-15      Interval Training   Interval Training  No  -  No  No      Treadmill   MPH  3  3  -  3.3    Grade  0  2  -  3.5    Minutes  15  15  -  15    METs  3.3  4.12  -  5.12      NuStep   Level  '6  6  6  7    '$ SPM  66  68  71  77    Minutes  '15  15  15  15    '$ METs  3.3  3.4  3.5  2.9      Arm Ergometer   Level  1.4  -  3  -    Minutes  15  -  15  -  METs  2.1  -  2.4  -      Home Exercise Plan   Plans to continue exercise at  Home (comment) walking at home.  Home (comment) walking at home.  -  Home (comment) walking at home.    Frequency  Add 2 additional days to program exercise sessions.  Add 2 additional days to program exercise sessions.  -  Add 2 additional days to program exercise sessions.    Initial Home Exercises Provided  05/01/17  05/01/17  -  05/01/17       Exercise Comments: Exercise Comments    Row Name 04/27/17 1028  05/01/17 1101         Exercise Comments  First full day of exercise!  Patient was oriented to gym and equipment including functions, settings, policies, and procedures.  Patient's individual exercise prescription and treatment plan were reviewed.  All starting workloads were established based on the results of the 6 minute walk test done at initial orientation visit.  The plan for exercise progression was also introduced and progression will be customized based on patient's performance and goals.  Reviewed home exercise with pt today.  Pt plans to walk 2 extra days a week at home for exercise.  Reviewed THR, pulse, RPE, sign and symptoms, NTG use, and when to call 911 or MD.  Also discussed weather considerations and indoor options.  Pt voiced understanding         Exercise Goals and Review: Exercise Goals    Row Name 04/20/17 1558             Exercise Goals   Increase Physical Activity  Yes       Intervention  Provide advice, education, support and counseling about physical activity/exercise needs.;Develop an individualized exercise prescription for aerobic and resistive training based on initial evaluation findings, risk stratification, comorbidities and participant's personal goals.       Expected Outcomes  Achievement of increased cardiorespiratory fitness and enhanced flexibility, muscular endurance and strength shown through measurements of functional capacity and personal statement of participant.       Increase Strength and Stamina  Yes       Intervention  Provide advice, education, support and counseling about physical activity/exercise needs.;Develop an individualized exercise prescription for aerobic and resistive training based on initial evaluation findings, risk stratification, comorbidities and participant's personal goals.       Expected Outcomes  Achievement of increased cardiorespiratory fitness and enhanced flexibility, muscular endurance and strength shown through  measurements of functional capacity and personal statement of participant.       Able to understand and use rate of perceived exertion (RPE) scale  Yes       Intervention  Provide education and explanation on how to use RPE scale       Expected Outcomes  Short Term: Able to use RPE daily in rehab to express subjective intensity level;[place] Term:  Able to use RPE to guide intensity level when exercising independently       Able to understand and use Dyspnea scale  Yes       Intervention  Provide education and explanation on how to use Dyspnea scale       Expected Outcomes  Short Term: Able to use Dyspnea scale daily in rehab to express subjective sense of shortness of breath during exertion;Auer Term: Able to use Dyspnea scale to guide intensity level when exercising independently       Knowledge and understanding of Target Heart  Rate Range (THRR)  Yes       Intervention  Provide education and explanation of THRR including how the numbers were predicted and where they are located for reference       Expected Outcomes  Short Term: Able to state/look up THRR;S[place]man Term: Able to use THRR to govern intensity when exercising independently;Short Term: Able to use daily as guideline for intensity in rehab       Able to check pulse independently  Yes       Intervention  Provide education and demonstration on how to check pulse in carotid and radial arteries.;Review the importance of being able to check your own pulse for safety during independent exercise       Expected Outcomes  Short Term: Able to explain why pulse checking is important during independent exercise;[place]juan Term: Able to check pulse independently and accurately       Understanding of Exercise Prescription  Yes       Intervention  Provide education, explanation, and written materials on patient's individual exercise prescription       Expected Outcomes  Short Term: Able to explain program exercise prescription;[place] Term: Able to explain home  exercise prescription to exercise independently          Exercise Goals Re-Evaluation : Exercise Goals Re-Evaluation    Row Name 04/27/17 1028 04/29/17 1245 05/01/17 1056 05/13/17 1049 05/27/17 1225     Exercise Goal Re-Evaluation   Exercise Goals Review  Understanding of Exercise Prescription;Able to understand and use Dyspnea scale;Knowledge and understanding of Target Heart Rate Range (THRR);Able to understand and use rate of perceived exertion (RPE) scale  Increase Physical Activity;Increase Strength and Stamina;Able to understand and use Dyspnea scale;Able to understand and use rate of perceived exertion (RPE) scale  Increase Physical Activity;Increase Strength and Stamina  Increase Physical Activity;Increase Strength and Stamina  Increase Physical Activity;Increase Strength and Stamina   Comments  Reviewed RPE scale, THR and program prescription with pt today.  Pt voiced understanding and was given a copy of goals to take home.   Elizabeth Hall has tolerated exrecise well in her first two sessions.  Staff will continue to monitor for progress.  Reviewed home exercise with pt today.  Pt plans to walk 2 extra days a week at home for exercise.  Reviewed THR, pulse, RPE, sign and symptoms, NTG use, and when to call 911 or MD.  Also discussed weather considerations and indoor options.  Pt voiced understanding  Elizabeth Hall is tolerating exercise well.  She has missed some sessions due to taking care of her husband.  She plans to return after Thanksgiving.  Elizabeth Hall has increased her walk speed.  Staff will monitor progress.   Expected Outcomes  Short: Use RPE daily to regulate intensity.  Eslick: Follow program prescription in THR.  Short - Elizabeth Hall will attend class regularly.  Moralez - Elizabeth Hall will increase overall MET level.  Short: add 2 extra days of walking at home. Rufer: add extra days of exercising.  Short - Elizabeth Hall will return and attend regularly.  Tiner - Elizabeth Hall will increase overall fitness level.  Short - Elizabeth Hall will build on  current levels.  Jeff - Elizabeth Hall will maintain fitness level on her own.   [place] Name 06/10/17 1222 06/15/17 1102 06/26/17 1145 07/08/17 1542 07/23/17 1120     Exercise Goal Re-Evaluation   Exercise Goals Review  Increase Physical Activity;Increase Strength and Stamina;Able to understand and use rate of perceived exertion (RPE) scale  Increase Physical Activity;Increase Strength  and Stamina;Able to understand and use rate of perceived exertion (RPE) scale  Increase Physical Activity;Increase Strength and Stamina;Able to understand and use rate of perceived exertion (RPE) scale;Able to understand and use Dyspnea scale;Understanding of Exercise Prescription  Increase Physical Activity;Increase Strength and Stamina;Able to understand and use rate of perceived exertion (RPE) scale;Able to understand and use Dyspnea scale  Increase Physical Activity;Able to understand and use rate of perceived exertion (RPE) scale;Increase Strength and Stamina;Able to understand and use Dyspnea scale   Comments  Elizabeth Hall is progressing well with exercise and has improved overall MET level.  Staff will continue to monitor progress.  Elizabeth Hall has been doing well in rehab.  She is starting feel stronger and stamina is beginning to come back.  She is doing some home exercise.  She is walking for 30 min for about 2 days a week.    Elizabeth Hall has increased overall MET level and increased weight strength training to 4 lb.  Elizabeth Hall continues to progress well with exercise.  She has increased weight strength training to 4 lb  Elizabeth Hall has increased overall MET level to 4!   Expected Outcomes  Short - Elizabeth Hall will continue to attend LW regularly [place] - Elizabeth Hall will improve overall MET level  Short: Maintain her home exercise routine.  Eggebrecht: Continue to work on improving her strength and stamina.   Short - Elizabeth Hall will continue to improve [place] - Elizabeth Hall will maintain fitness on her own  Short - Elizabeth Hall will continue to attend regularly [place]ann - Elizabeth Hall will maintain exercise on her own   Short - Elizabeth Hall will continue to attend three days per week [place] - Elizabeth Hall will maintain exercise on her own   [place] Name 07/27/17 1122             Exercise Goal Re-Evaluation   Exercise Goals Review  Increase Physical Activity;Increase Strength and Stamina       Comments  6 min walk done today. Results reviewed with patient.           Discharge Exercise Prescription (Final Exercise Prescription Changes): Exercise Prescription Changes - 07/23/17 1100      Response to Exercise   Blood Pressure (Admit)  128/70    Blood Pressure (Exit)  124/64    Heart Rate (Admit)  75 bpm    Heart Rate (Exercise)  140 bpm    Heart Rate (Exit)  95 bpm    Oxygen Saturation (Admit)  100 %    Oxygen Saturation (Exercise)  95 %    Oxygen Saturation (Exit)  97 %    Rating of Perceived Exertion (Exercise)  12    Perceived Dyspnea (Exercise)  1    Symptoms  none    Duration  Continue with 45 min of aerobic exercise without signs/symptoms of physical distress.    Intensity  THRR unchanged      Progression   Progression  Continue to progress workloads to maintain intensity without signs/symptoms of physical distress.    Average METs  4      Resistance Training   Training Prescription  Yes    Weight  4 lb    Reps  10-15      Interval Training   Interval Training  No      Treadmill   MPH  3.3    Grade  3.5    Minutes  15    METs  5.12      NuStep   Level  7    SPM  77    Minutes  15    METs  2.9      Home Exercise Plan   Plans to continue exercise at  Home (comment) walking at home.    Frequency  Add 2 additional days to program exercise sessions.    Initial Home Exercises Provided  05/01/17       Nutrition:  Target Goals: Understanding of nutrition guidelines, daily intake of sodium '1500mg'$ , cholesterol '200mg'$ , calories 30% from fat and 7% or less from saturated fats, daily to have 5 or more servings of fruits and vegetables.  Biometrics: Pre Biometrics - 04/20/17 1558      Pre  Biometrics   Height  5' 3.5" (1.613 m)    Weight  153 lb 11.2 oz (69.7 kg)    Waist Circumference  34 inches    Hip Circumference  39.75 inches    Waist to Hip Ratio  0.86 %    BMI (Calculated)  26.8      Post Biometrics - 07/27/17 1121       Post  Biometrics   Height  5' 3.5" (1.613 m)    Weight  149 lb 14.4 oz (68 kg)    Waist Circumference  34 inches    Hip Circumference  39.5 inches    Waist to Hip Ratio  0.86 %    BMI (Calculated)  26.13       Nutrition Therapy Plan and Nutrition Goals: Nutrition Therapy & Goals - 07/13/17 1032      Nutrition Therapy   Diet  TLC    Drug/Food Interactions  Coumadin/Vit K    Protein (specify units)  50g    Fiber  25 grams    Saturated Fats  10 max. grams    Fruits and Vegetables  6 servings/day    Sodium  1500 grams      Personal Nutrition Goals   Nutrition Goal  Practice reading the nutrition facts label and ingredients list more often; identify foods high/low in whole grains, sugar, sodium    Personal Goal #2  Consume more whole food-based snack options, such as fruit as mentioned    Personal Goal #3  Monitor portion sizes, use [employer] program as a tool to help you learn proper portions    Comments  She has joined [employer] 2 weeks ago to help with slight weight reduction      Intervention Plan   Intervention  Prescribe, educate and counsel regarding individualized specific dietary modifications aiming towards targeted core components such as weight, hypertension, lipid management, diabetes, heart failure and other comorbidities.;Nutrition handout(s) given to patient. Coumadin/Warfarin dietary interactions handout provided    Expected Outcomes  Short Term Goal: Understand basic principles of dietary content, such as calories, fat, sodium, cholesterol and nutrients.;Short Term Goal: A plan has been developed with personal nutrition goals set during dietitian appointment.;Azer Term Goal: Adherence to prescribed nutrition plan.        Nutrition Assessments: Nutrition Assessments - 07/29/17 1422      MEDFICTS Scores   Pre Score  20       Nutrition Goals Re-Evaluation: Nutrition Goals Re-Evaluation    [place] Name 05/06/17 1109 05/20/17 1150 06/15/17 1108 07/17/17 1431       Goals   Current Weight  151 lb 1.6 oz (68.5 kg)  -  154 lb 3.2 oz (69.9 kg)  148 lb 14.4 oz (67.5 kg)    Nutrition Goal  Eat healthier and lose weight.  -  Eat  healthier and lose weight.    Lose weight and eat a healtier diet    Comment  Elizabeth Hall has made an appointment to see the dietician.  Scheduled to meet with RD 12/10  Her appointment was rescheduled due to the weather to meet with Lattie Haw after class.  She is looking forward to talk to her for some tips to help lose the weight again.   Elizabeth Hall states she has been eating more fruit and vegetables. She has lost a few pounds since the start of the program. She is currently working on her portion control. She would like to lose about 10 more pounds.    Expected Outcome  Short: meet with dietician. Boesen: Adhere to a diet plan.  Short - meet with RD  Tammen - follow recommendations from RD  Short: Meet with RD.  Luckett: Continue to eat healthier.  Short: lose weight by eating smaller portions. Laureano: maintain weightloss.       Nutrition Goals Discharge (Final Nutrition Goals Re-Evaluation): Nutrition Goals Re-Evaluation - 07/17/17 1431      Goals   Current Weight  148 lb 14.4 oz (67.5 kg)    Nutrition Goal  Lose weight and eat a healtier diet    Comment  Elizabeth Hall states she has been eating more fruit and vegetables. She has lost a few pounds since the start of the program. She is currently working on her portion control. She would like to lose about 10 more pounds.    Expected Outcome  Short: lose weight by eating smaller portions. Dueitt: maintain weightloss.       Psychosocial: Target Goals: Acknowledge presence or absence of significant depression and/or stress, maximize coping skills, provide positive support  system. Participant is able to verbalize types and ability to use techniques and skills needed for reducing stress and depression.   Initial Review & Psychosocial Screening: Initial Psych Review & Screening - 04/20/17 1446      Initial Review   Current issues with  Current Sleep Concerns;Current Stress Concerns    Source of Stress Concerns  Family    Comments  Husband has cancer, he was diagnosed five years ago and is on IV chemo.       Family Dynamics   Good Support System?  Yes    Comments  Patient has one daughter and she lives in [place]. She has one grandson and two step grandkids.      Barriers   Psychosocial barriers to participate in program  The patient should benefit from training in stress management and relaxation.      Screening Interventions   Interventions  Yes;Encouraged to exercise;Program counselor consult;Provide feedback about the scores to participant;To provide support and resources with identified psychosocial needs    Expected Outcomes  Short Term goal: Utilizing psychosocial counselor, staff and physician to assist with identification of specific Stressors or current issues interfering with healing process. Setting desired goal for each stressor or current issue identified.;[place] Term Goal: Stressors or current issues are controlled or eliminated.;Short Term goal: Identification and review with participant of any Quality of Life or Depression concerns found by scoring the questionnaire.;[place] Term goal: The participant improves quality of Life and PHQ9 Scores as seen by post scores and/or verbalization of changes       Quality of Life Scores:  Scores of 19 and below usually indicate a poorer quality of life in these areas.  A difference of  2-3 points is a clinically meaningful difference.  A difference of 2-3  points in the total score of the Quality of Life Index has been associated with significant improvement in overall quality of life, self-image, physical  symptoms, and general health in studies assessing change in quality of life.  PHQ-9: Recent Review Flowsheet Data    Depression screen [facility] 2/9 07/29/2017 07/20/2017 04/20/2017 07/07/2016 07/02/2015   Decreased Interest 0 0 0 0 0   Down, Depressed, Hopeless 0 0 0 0 0   PHQ - 2 Score 0 0 0 0 0   Altered sleeping 0 0 1 - -   Tired, decreased energy 0 0 1 - -   Change in appetite 0 0 1 - -   Feeling bad or failure about yourself  0 0 0 - -   Trouble concentrating 0 0 0 - -   Moving slowly or fidgety/restless 0 0 0 - -   Suicidal thoughts 0 0 0 - -   PHQ-9 Score 0 0 3 - -   Difficult doing work/chores - Not difficult at all Not difficult at all - -     Interpretation of Total Score  Total Score Depression Severity:  1-4 = Minimal depression, 5-9 = Mild depression, 10-14 = Moderate depression, 15-19 = Moderately severe depression, 20-27 = Severe depression   Psychosocial Evaluation and Intervention: Psychosocial Evaluation - 04/27/17 1123      Psychosocial Evaluation & Interventions   Interventions  Encouraged to exercise with the program and follow exercise prescription;Stress management education;Relaxation education    Comments  Counselor met with Ms. Pal Elizabeth Hall) today for initial psychosocial evaluation.  She is a 77 year old who has been experiencing shortness of breath recently.  She has a strong support system with a spouse of 33+ years; an adult daughter and son in law who live close by and friends and active involvement in her local church.  Elizabeth Hall has had multiple health isues with open heart surgery in 2013 and did a Cardiac Rehab program following that in [place], [place] where she was residing at the time.  Elizabeth Hall states she sleeps fairly well with 7-8 hours of sleep and her appetite is good as well.  She denies a history of depression or anxiety and is typically in a positive mood currently.  She has multiple stressors with her spouse's health issues (which are improving) and her own - while  she works part time.  There was some recent stress with a financial scam last year that has been addressed and is improving now.  Elizabeth Hall's goals are to breathe better and lose some weight while in this program.  She has gym equipment at her apartment complex that she will use and supplement when her doctor releases her to do so.  Staff will follow with Elizabeth Hall throughout the course of this program.      Expected Outcomes  Elizabeth Hall will benefit from consistent exercise to achieve her stated goals.  She will also be meeting with the dietician to address her weight loss goal.  The educational and psychoeducational components of this program will help Elizabeth Hall understand and learn to cope better with her condition and with life stressors.  Staff will follow with her.      Continue Psychosocial Services   Follow up required by staff       Psychosocial Re-Evaluation: Psychosocial Re-Evaluation    [place] Name 05/20/17 1058 05/20/17 1153 06/15/17 1110 07/17/17 1434       Psychosocial Re-Evaluation   Current issues with  Current Stress Concerns  Current  Stress Concerns  Current Stress Concerns  Current Stress Concerns    Comments  Counselor follow up with Elizabeth Hall reporting already seeing some progress since coming into this program - with less shortness of breath.  She has been out several weeks due to her husband being in the hospital and that has been stressful, but she reports he is somewhat better and she is relieved.  Counselor commended Elizabeth Hall on her progress made and commitment to exercise.    Elizabeth Hall's current stress involves helping her spouse who has multiple myeloma.  He has recently had unexplained fever and has been referred to an Infectious disease speciallist.  They both work part time and she enjoys her work and it is not stressful for her.  Elizabeth Hall's stress levels have leveled off.  Her husband continues to be her primary source of stress, but his health starting to calm back down. She continue to work and enjoy her jobs.   The best part of coming to rehab has been that she is feeling like she is making progress with her strength and shortness of breath.  Elizabeth Hall's husband has been having chemotherapy and is now monthly instead of weekly. Her stress levels have gone down a little bit since her husband does not have as many appointments to get to. She states her sleep has been good. She has a positive outlook and is willing to work hard.    Expected Outcomes  Elizabeth Hall will continue to exercise consistently for her own health and for managing stress in her life.    Short - Elizabeth Hall will continue to practice self care so she can better help her spouse.  [place]uba - Elizabeth Hall will manage stress through exercising and self care.  Short: Continue to focus on her self to improve strength and shortness of breath.  Hedlund: Continue to manage stress in a healthy way.   Short: attend [name] to minimize stress. Kops: maintain an exercise routine to keep stress at a minimum.    Interventions  Stress management education  Encouraged to attend Pulmonary Rehabilitation for the exercise  Encouraged to attend Pulmonary Rehabilitation for the exercise;Stress management education  Encouraged to attend Pulmonary Rehabilitation for the exercise    Continue Psychosocial Services   Follow up required by staff  Follow up required by staff  Follow up required by staff  Follow up required by staff    Comments  -  -  Husband has cancer, he was diagnosed five years ago and is on IV chemo.   -      Initial Review   Source of Stress Concerns  -  -  Family  -       Psychosocial Discharge (Final Psychosocial Re-Evaluation): Psychosocial Re-Evaluation - 07/17/17 1434      Psychosocial Re-Evaluation   Current issues with  Current Stress Concerns    Comments  Elizabeth Hall's husband has been having chemotherapy and is now monthly instead of weekly. Her stress levels have gone down a little bit since her husband does not have as many appointments to get to. She states her sleep has  been good. She has a positive outlook and is willing to work hard.    Expected Outcomes  Short: attend [name] to minimize stress. [place]: maintain an exercise routine to keep stress at a minimum.    Interventions  Encouraged to attend Pulmonary Rehabilitation for the exercise    Continue Psychosocial Services   Follow up required by staff       Education:  Education Goals: Education classes will be provided on a weekly basis, covering required topics. Participant will state understanding/return demonstration of topics presented.  Learning Barriers/Preferences: Learning Barriers/Preferences - 04/20/17 1459      Learning Barriers/Preferences   Learning Barriers  Sight wears glasses    Learning Preferences  None       Education Topics:  Initial Evaluation Education: - Verbal, written and demonstration of respiratory meds, oximetry and breathing techniques. Instruction on use of nebulizers and MDIs and importance of monitoring MDI activations.   Pulmonary Rehab from 07/27/2017 in [facility] Cardiac and Pulmonary Rehab  Date  04/20/17  Educator  [facility]  Instruction Review Code  1- Verbalizes Understanding      General Nutrition Guidelines/Fats and Fiber: -Group instruction provided by verbal, written material, models and posters to present the general guidelines for heart healthy nutrition. Gives an explanation and review of dietary fats and fiber.   Pulmonary Rehab from 07/27/2017 in [facility] Cardiac and Pulmonary Rehab  Date  07/20/17  Educator  CR  Instruction Review Code  1- Verbalizes Understanding      Controlling Sodium/Reading Food Labels: -Group verbal and written material supporting the discussion of sodium use in heart healthy nutrition. Review and explanation with models, verbal and written materials for utilization of the food label.   Pulmonary Rehab from 07/27/2017 in [facility] Cardiac and Pulmonary Rehab  Date  07/27/17  Educator  PI  Instruction Review Code  1- Verbalizes Understanding       Exercise Physiology & General Exercise Guidelines: - Group verbal and written instruction with models to review the exercise physiology of the cardiovascular system and associated critical values. Provides general exercise guidelines with specific guidelines to those with heart or lung disease.    Pulmonary Rehab from 07/27/2017 in [facility] Cardiac and Pulmonary Rehab  Date  06/26/17  Educator  [facility]  Instruction Review Code  1- Verbalizes Understanding      Aerobic Exercise & Resistance Training: - Gives group verbal and written instruction on the various components of exercise. Focuses on aerobic and resistive training programs and the benefits of this training and how to safely progress through these programs.   Pulmonary Rehab from 07/27/2017 in [facility] Cardiac and Pulmonary Rehab  Date  07/10/17  Educator  AS  Instruction Review Code  1- Verbalizes Understanding      Flexibility, Balance, Mind/Body Relaxation: Provides group verbal/written instruction on the benefits of flexibility and balance training, including mind/body exercise modes such as yoga, pilates and tai chi.  Demonstration and skill practice provided.   Stress and Anxiety: - Provides group verbal and written instruction about the health risks of elevated stress and causes of high stress.  Discuss the correlation between heart/lung disease and anxiety and treatment options. Review healthy ways to manage with stress and anxiety.   Depression: - Provides group verbal and written instruction on the correlation between heart/lung disease and depressed mood, treatment options, and the stigmas associated with seeking treatment.   Pulmonary Rehab from 07/27/2017 in [facility] Cardiac and Pulmonary Rehab  Date  05/06/17  Educator  [facility]  Instruction Review Code  1- Verbalizes Understanding      Exercise & Equipment Safety: - Individual verbal instruction and demonstration of equipment use and safety with use of the equipment.    Pulmonary Rehab from 07/27/2017 in [facility] Cardiac and Pulmonary Rehab  Date  04/20/17  Educator  [facility]  Instruction Review Code  1- Verbalizes Understanding      Infection Prevention: -  Provides verbal and written material to individual with discussion of infection control including proper hand washing and proper equipment cleaning during exercise session.   Pulmonary Rehab from 07/27/2017 in [facility] Cardiac and Pulmonary Rehab  Date  04/20/17  Educator  [facility]  Instruction Review Code  1- Verbalizes Understanding      Falls Prevention: - Provides verbal and written material to individual with discussion of falls prevention and safety.   Pulmonary Rehab from 07/27/2017 in [facility] Cardiac and Pulmonary Rehab  Date  04/20/17  Educator  [facility]  Instruction Review Code  1- Verbalizes Understanding      Diabetes: - Individual verbal and written instruction to review signs/symptoms of diabetes, desired ranges of glucose level fasting, after meals and with exercise. Advice that pre and post exercise glucose checks will be done for 3 sessions at entry of program.   Chronic Lung Diseases: - Group verbal and written instruction to review updates, respiratory medications, advancements in procedures and treatments. Discuss use of supplemental oxygen including available portable oxygen systems, continuous and intermittent flow rates, concentrators, personal use and safety guidelines. Review proper use of inhaler and spacers. Provide informative websites for self-education.    Energy Conservation: - Provide group verbal and written instruction for methods to conserve energy, plan and organize activities. Instruct on pacing techniques, use of adaptive equipment and posture/positioning to relieve shortness of breath.   Pulmonary Rehab from 07/27/2017 in [facility] Cardiac and Pulmonary Rehab  Date  05/20/17  Educator  [facility]  Instruction Review Code  1- Verbalizes Understanding      Triggers and Exacerbations: - Group verbal  and written instruction to review types of environmental triggers and ways to prevent exacerbations. Discuss weather changes, air quality and the benefits of nasal washing. Review warning signs and symptoms to help prevent infections. Discuss techniques for effective airway clearance, coughing, and vibrations.   Pulmonary Rehab from 07/27/2017 in [facility] Cardiac and Pulmonary Rehab  Date  07/08/17  Educator  [facility]  Instruction Review Code  1- Verbalizes Understanding      AED/CPR: - Group verbal and written instruction with the use of models to demonstrate the basic use of the AED with the basic ABC's of resuscitation.   Pulmonary Rehab from 07/27/2017 in [facility] Cardiac and Pulmonary Rehab  Date  06/12/17  Educator  [facility]  Instruction Review Code  1- Actuary and Physiology of the Lungs: - Group verbal and written instruction with the use of models to provide basic lung anatomy and physiology related to function, structure and complications of lung disease.   Pulmonary Rehab from 07/27/2017 in [facility] Cardiac and Pulmonary Rehab  Date  05/27/17  Educator  [facility]  Instruction Review Code  1- Verbalizes Understanding      Anatomy & Physiology of the Heart: - Group verbal and written instruction and models provide basic cardiac anatomy and physiology, with the coronary electrical and arterial systems. Review of Valvular disease and Heart Failure   Pulmonary Rehab from 07/27/2017 in [facility] Cardiac and Pulmonary Rehab  Date  07/22/17  Educator  [facility]  Instruction Review Code  1- Verbalizes Understanding      Cardiac Medications: - Group verbal and written instruction to review commonly prescribed medications for heart disease. Reviews the medication, class of the drug, and side effects.   Know Your Numbers and Risk Factors: -Group verbal and written instruction about important numbers in your health.  Discussion of what are risk factors and how they  play a role in the disease  process.  Review of Cholesterol, Blood Pressure, Diabetes, and BMI and the role they play in your overall health.   Pulmonary Rehab from 07/27/2017 in [facility] Cardiac and Pulmonary Rehab  Date  07/01/17  Educator  [facility]  Instruction Review Code  1- Verbalizes Understanding      Sleep Hygiene: -Provides group verbal and written instruction about how sleep can affect your health.  Define sleep hygiene, discuss sleep cycles and impact of sleep habits. Review good sleep hygiene tips.    Other: -Provides group and verbal instruction on various topics (see comments)   Pulmonary Rehab from 07/27/2017 in [facility] Cardiac and Pulmonary Rehab  Date  07/15/17  Educator  [facility]  Instruction Review Code  1- Verbalizes Understanding [SLEEP]       Knowledge Questionnaire Score: Knowledge Questionnaire Score - 07/29/17 1419      Knowledge Questionnaire Score   Pre Score  9/10    Post Score  9/10 reviewed with patient        Core Components/Risk Factors/Patient Goals at Admission: Personal Goals and Risk Factors at Admission - 04/20/17 1505      Core Components/Risk Factors/Patient Goals on Admission    Weight Management  Yes;Weight Loss    Intervention  Weight Management: Develop a combined nutrition and exercise program designed to reach desired caloric intake, while maintaining appropriate intake of nutrient and fiber, sodium and fats, and appropriate energy expenditure required for the weight goal.;Weight Management: Provide education and appropriate resources to help participant work on and attain dietary goals.;Weight Management/Obesity: Establish reasonable short term and Pallo term weight goals.    Admit Weight  153 lb 11.2 oz (69.7 kg)    Goal Weight: Short Term  148 lb (67.1 kg)    Goal Weight: Hafner Term  135 lb (61.2 kg)    Expected Outcomes  Bowron Term: Adherence to nutrition and physical activity/exercise program aimed toward attainment of established weight goal;Weight Maintenance: Understanding of  the daily nutrition guidelines, which includes 25-35% calories from fat, 7% or less cal from saturated fats, less than '200mg'$  cholesterol, less than 1.5gm of sodium, & 5 or more servings of fruits and vegetables daily;Short Term: Continue to assess and modify interventions until short term weight is achieved;Weight Loss: Understanding of general recommendations for a balanced deficit meal plan, which promotes 1-2 lb weight loss per week and includes a negative energy balance of [phone] kcal/d;Understanding recommendations for meals to include 15-35% energy as protein, 25-35% energy from fat, 35-60% energy from carbohydrates, less than '200mg'$  of dietary cholesterol, 20-35 gm of total fiber daily;Understanding of distribution of calorie intake throughout the day with the consumption of 4-5 meals/snacks    Improve shortness of breath with ADL's  Yes    Intervention  Provide education, individualized exercise plan and daily activity instruction to help decrease symptoms of SOB with activities of daily living.    Expected Outcomes  Short Term: Achieves a reduction of symptoms when performing activities of daily living.    Heart Failure  Yes    Intervention  Provide a combined exercise and nutrition program that is supplemented with education, support and counseling about heart failure. Directed toward relieving symptoms such as shortness of breath, decreased exercise tolerance, and extremity edema.    Expected Outcomes  Improve functional capacity of life;Short term: Attendance in program 2-3 days a week with increased exercise capacity. Reported lower sodium intake. Reported increased fruit and vegetable intake. Reports medication compliance.;Short term: Daily  weights obtained and reported for increase. Utilizing diuretic protocols set by physician.;Worrell term: Adoption of self-care skills and reduction of barriers for early signs and symptoms recognition and intervention leading to self-care maintenance.     Hypertension  Yes    Intervention  Provide education on lifestyle modifcations including regular physical activity/exercise, weight management, moderate sodium restriction and increased consumption of fresh fruit, vegetables, and low fat dairy, alcohol moderation, and smoking cessation.;Monitor prescription use compliance.    Expected Outcomes  Shieh Term: Maintenance of blood pressure at goal levels.;Short Term: Continued assessment and intervention until BP is < 140/4m HG in hypertensive participants. < 130/866mHG in hypertensive participants with diabetes, heart failure or chronic kidney disease.    Stress  Yes    Intervention  Offer individual and/or small group education and counseling on adjustment to heart disease, stress management and health-related lifestyle change. Teach and support self-help strategies.;Refer participants experiencing significant psychosocial distress to appropriate mental health specialists for further evaluation and treatment. When possible, include family members and significant others in education/counseling sessions.    Expected Outcomes  Short Term: Participant demonstrates changes in health-related behavior, relaxation and other stress management skills, ability to obtain effective social support, and compliance with psychotropic medications if prescribed.;Reineck Term: Emotional wellbeing is indicated by absence of clinically significant psychosocial distress or social isolation.       Core Components/Risk Factors/Patient Goals Review:  Goals and Risk Factor Review    Row Name 05/20/17 1150 06/15/17 1104 07/17/17 1422         Core Components/Risk Factors/Patient Goals Review   Personal Goals Review  Weight Management/Obesity;Hypertension;Stress;Improve shortness of breath with ADL's  Weight Management/Obesity;Hypertension;Improve shortness of breath with ADL's  Weight Management/Obesity;Hypertension;Improve shortness of breath with ADL's     Review  JuBethena Royss  scheduled to meet with RD 12/10.  She states she is not as short of breath when walking and carrying.  She is not getting a lot of exrecise at home.  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  Her stress is from his illness and ongoing complications.  JuBethena Roysas been doing well in rehab.  She has gained a little but plans to take an extra fluid pill today.  Her blood pressures have been doing good but she does not check them at home.  She plans to get the machine out and start checking more frequently for her and her husband.  She is doing better with her SOB resting.  She finds that it still happens with heavier exertion, but has gotten a lot better since starting program.    JuBethena Roysas been checking her blood pressure at home and it has been reading around 100/70 for her resting blood pressure. She still would like to lose some weight. Her breathing has improved at home while doing her daily activities.     Expected Outcomes  Short - JuBethena Roysill meet with RD and staff will review home exercise with her.  [place] - JuBethena Roysill begin more healthy eating and add exercise at home.  Short: Get out blood pressure cuff and start checking at home.  Beyene: Continue to work on weight loss and risk factors.   Short: attend [name] to lose weight. Milius: maintain weight after weight loss.        Core Components/Risk Factors/Patient Goals at Discharge (Final Review):  Goals and Risk Factor Review - 07/17/17 1422      Core Components/Risk Factors/Patient Goals Review   Personal  Goals Review  Weight Management/Obesity;Hypertension;Improve shortness of breath with ADL's    Review  Elizabeth Hall has been checking her blood pressure at home and it has been reading around 100/70 for her resting blood pressure. She still would like to lose some weight. Her breathing has improved at home while doing her daily activities.    Expected Outcomes  Short: attend [name] to lose weight. Benner: maintain weight after weight loss.        ITP Comments: ITP Comments    Row Name 04/20/17 1427 04/27/17 1121 05/07/17 1447 05/11/17 0825 06/08/17 0816   ITP Comments  Medical Evaluation completed. Chart sent for review and changes to Dr. Emily Filbert Director of [place]. Visit Diagnosis can be found in [facility] encounter 04/20/17  Spent 10 minutes with patient reviewing PLB.  Elizabeth Hall stopped by to say she will be back after Thanksgiving due to her husbands health.  She will return Monday 11/26.  30 day review completed. ITP sent to Dr. Emily Filbert Director of [place]. Continue with ITP unless changes are made by physician.    30 day review completed. ITP sent to Dr. Emily Filbert Director of [place]. Continue with ITP unless changes are made by physician.     [place] Name 07/06/17 0817 08/03/17 0910         ITP Comments  30 day review completed. ITP sent to Dr. Emily Filbert Director of [place]. Continue with ITP unless changes are made by physician.  30 day review completed. ITP sent to Dr. Emily Filbert Director of [place]. Continue with ITP unless changes are made by physician.         Comments: 30 day review

## 2017-08-04 ENCOUNTER — Ambulatory Visit (INDEPENDENT_AMBULATORY_CARE_PROVIDER_SITE_OTHER): Payer: Medicare Other | Admitting: Family Medicine

## 2017-08-04 ENCOUNTER — Encounter: Payer: Self-pay | Admitting: Family Medicine

## 2017-08-04 VITALS — BP 118/64 | HR 76 | Temp 97.9°F | Ht 64.0 in | Wt 144.8 lb

## 2017-08-04 DIAGNOSIS — Z7189 Other specified counseling: Secondary | ICD-10-CM

## 2017-08-04 DIAGNOSIS — N183 Chronic kidney disease, stage 3 unspecified: Secondary | ICD-10-CM

## 2017-08-04 DIAGNOSIS — R7303 Prediabetes: Secondary | ICD-10-CM | POA: Diagnosis not present

## 2017-08-04 DIAGNOSIS — I5022 Chronic systolic (congestive) heart failure: Secondary | ICD-10-CM

## 2017-08-04 DIAGNOSIS — I482 Chronic atrial fibrillation, unspecified: Secondary | ICD-10-CM

## 2017-08-04 DIAGNOSIS — I10 Essential (primary) hypertension: Secondary | ICD-10-CM

## 2017-08-04 DIAGNOSIS — Z79890 Hormone replacement therapy (postmenopausal): Secondary | ICD-10-CM

## 2017-08-04 DIAGNOSIS — R0609 Other forms of dyspnea: Secondary | ICD-10-CM | POA: Diagnosis not present

## 2017-08-04 DIAGNOSIS — E785 Hyperlipidemia, unspecified: Secondary | ICD-10-CM | POA: Diagnosis not present

## 2017-08-04 NOTE — Patient Instructions (Addendum)
If interested, check with pharmacy about new 2 shot shingles series (shingrix).  Take a look at MOST form provided today.  Good to see you today. Call us with questions. Return as needed or in 1 year for next medicare wellness and follow up visit.  Health Maintenance, Female Adopting a healthy lifestyle and getting preventive care can go a [place] way to promote health and wellness. Talk with your health care provider about what schedule of regular examinations is right for you. This is a good chance for you to check in with your provider about disease prevention and staying healthy. In between checkups, there are plenty of things you can do on your own. Experts have done a lot of research about which lifestyle changes and preventive measures are most likely to keep you healthy. Ask your health care provider for more information. Weight and diet Eat a healthy diet  Be sure to include plenty of vegetables, fruits, low-fat dairy products, and lean protein.  Do not eat a lot of foods high in solid fats, added sugars, or salt.  Get regular exercise. This is one of the most important things you can do for your health. ? Most adults should exercise for at least 150 minutes each week. The exercise should increase your heart rate and make you sweat (moderate-intensity exercise). ? Most adults should also do strengthening exercises at least twice a week. This is in addition to the moderate-intensity exercise.  Maintain a healthy weight  Body mass index (BMI) is a measurement that can be used to identify possible weight problems. It estimates body fat based on height and weight. Your health care provider can help determine your BMI and help you achieve or maintain a healthy weight.  For females 62 years of age and older: ? A BMI below 18.5 is considered underweight. ? A BMI of 18.5 to 24.9 is normal. ? A BMI of 25 to 29.9 is considered overweight. ? A BMI of 30 and above is considered obese.  Watch  levels of cholesterol and blood lipids  You should start having your blood tested for lipids and cholesterol at 76 years of age, then have this test every 5 years.  You may need to have your cholesterol levels checked more often if: ? Your lipid or cholesterol levels are high. ? You are older than 76 years of age. ? You are at high risk for heart disease.  Cancer screening Lung Cancer  Lung cancer screening is recommended for adults 89-62 years old who are at high risk for lung cancer because of a history of smoking.  A yearly low-dose CT scan of the lungs is recommended for people who: ? Currently smoke. ? Have quit within the past 15 years. ? Have at least a 30-pack-year history of smoking. A pack year is smoking an average of one pack of cigarettes a day for 1 year.  Yearly screening should continue until it has been 15 years since you quit.  Yearly screening should stop if you develop a health problem that would prevent you from having lung cancer treatment.  Breast Cancer  Practice breast self-awareness. This means understanding how your breasts normally appear and feel.  It also means doing regular breast self-exams. Let your health care provider know about any changes, no matter how small.  If you are in your 20s or 30s, you should have a clinical breast exam (CBE) by a health care provider every 1-3 years as part of a regular health exam.  If you are 40 or older, have a CBE every year. Also consider having a breast X-ray (mammogram) every year.  If you have a family history of breast cancer, talk to your health care provider about genetic screening.  If you are at high risk for breast cancer, talk to your health care provider about having an MRI and a mammogram every year.  Breast cancer gene (BRCA) assessment is recommended for women who have family members with BRCA-related cancers. BRCA-related cancers include: ? Breast. ? Ovarian. ? Tubal. ? Peritoneal  cancers.  Results of the assessment will determine the need for genetic counseling and BRCA1 and BRCA2 testing.  Cervical Cancer Your health care provider may recommend that you be screened regularly for cancer of the pelvic organs (ovaries, uterus, and vagina). This screening involves a pelvic examination, including checking for microscopic changes to the surface of your cervix (Pap test). You may be encouraged to have this screening done every 3 years, beginning at age 21.  For women ages 17-65, health care providers may recommend pelvic exams and Pap testing every 3 years, or they may recommend the Pap and pelvic exam, combined with testing for human papilloma virus (HPV), every 5 years. Some types of HPV increase your risk of cervical cancer. Testing for HPV may also be done on women of any age with unclear Pap test results.  Other health care providers may not recommend any screening for nonpregnant women who are considered low risk for pelvic cancer and who do not have symptoms. Ask your health care provider if a screening pelvic exam is right for you.  If you have had past treatment for cervical cancer or a condition that could lead to cancer, you need Pap tests and screening for cancer for at least 20 years after your treatment. If Pap tests have been discontinued, your risk factors (such as having a new sexual partner) need to be reassessed to determine if screening should resume. Some women have medical problems that increase the chance of getting cervical cancer. In these cases, your health care provider may recommend more frequent screening and Pap tests.  Colorectal Cancer  This type of cancer can be detected and often prevented.  Routine colorectal cancer screening usually begins at 76 years of age and continues through 76 years of age.  Your health care provider may recommend screening at an earlier age if you have risk factors for colon cancer.  Your health care provider may also  recommend using home test kits to check for hidden blood in the stool.  A small camera at the end of a tube can be used to examine your colon directly (sigmoidoscopy or colonoscopy). This is done to check for the earliest forms of colorectal cancer.  Routine screening usually begins at age 43.  Direct examination of the colon should be repeated every 5-10 years through 76 years of age. However, you may need to be screened more often if early forms of precancerous polyps or small growths are found.  Skin Cancer  Check your skin from head to toe regularly.  Tell your health care provider about any new moles or changes in moles, especially if there is a change in a mole's shape or color.  Also tell your health care provider if you have a mole that is larger than the size of a pencil eraser.  Always use sunscreen. Apply sunscreen liberally and repeatedly throughout the day.  Protect yourself by wearing Blew sleeves, pants, a wide-brimmed hat, and  sunglasses whenever you are outside.  Heart disease, diabetes, and high blood pressure  High blood pressure causes heart disease and increases the risk of stroke. High blood pressure is more likely to develop in: ? People who have blood pressure in the high end of the normal range (130-139/85-89 mm Hg). ? People who are overweight or obese. ? People who are African American.  If you are 86-23 years of age, have your blood pressure checked every 3-5 years. If you are 44 years of age or older, have your blood pressure checked every year. You should have your blood pressure measured twice-once when you are at a hospital or clinic, and once when you are not at a hospital or clinic. Record the average of the two measurements. To check your blood pressure when you are not at a hospital or clinic, you can use: ? An automated blood pressure machine at a pharmacy. ? A home blood pressure monitor.  If you are between 55 years and 19 years old, ask your  health care provider if you should take aspirin to prevent strokes.  Have regular diabetes screenings. This involves taking a blood sample to check your fasting blood sugar level. ? If you are at a normal weight and have a low risk for diabetes, have this test once every three years after 76 years of age. ? If you are overweight and have a high risk for diabetes, consider being tested at a younger age or more often. Preventing infection Hepatitis B  If you have a higher risk for hepatitis B, you should be screened for this virus. You are considered at high risk for hepatitis B if: ? You were born in a country where hepatitis B is common. Ask your health care provider which countries are considered high risk. ? Your parents were born in a high-risk country, and you have not been immunized against hepatitis B (hepatitis B vaccine). ? You have HIV or AIDS. ? You use needles to inject street drugs. ? You live with someone who has hepatitis B. ? You have had sex with someone who has hepatitis B. ? You get hemodialysis treatment. ? You take certain medicines for conditions, including cancer, organ transplantation, and autoimmune conditions.  Hepatitis C  Blood testing is recommended for: ? Everyone born from [birth year] through 1964. ? Anyone with known risk factors for hepatitis C.  Sexually transmitted infections (STIs)  You should be screened for sexually transmitted infections (STIs) including gonorrhea and chlamydia if: ? You are sexually active and are younger than 76 years of age. ? You are older than 76 years of age and your health care provider tells you that you are at risk for this type of infection. ? Your sexual activity has changed since you were last screened and you are at an increased risk for chlamydia or gonorrhea. Ask your health care provider if you are at risk.  If you do not have HIV, but are at risk, it may be recommended that you take a prescription medicine daily to  prevent HIV infection. This is called pre-exposure prophylaxis (PrEP). You are considered at risk if: ? You are sexually active and do not regularly use condoms or know the HIV status of your partner(s). ? You take drugs by injection. ? You are sexually active with a partner who has HIV.  Talk with your health care provider about whether you are at high risk of being infected with HIV. If you choose to begin PrEP, you  should first be tested for HIV. You should then be tested every 3 months for as Bull[place] as you are taking PrEP. Pregnancy  If you are premenopausal and you may become pregnant, ask your health care provider about preconception counseling.  If you may become pregnant, take 400 to 800 micrograms (mcg) of folic acid every day.  If you want to prevent pregnancy, talk to your health care provider about birth control (contraception). Osteoporosis and menopause  Osteoporosis is a disease in which the bones lose minerals and strength with aging. This can result in serious bone fractures. Your risk for osteoporosis can be identified using a bone density scan.  If you are 49 years of age or older, or if you are at risk for osteoporosis and fractures, ask your health care provider if you should be screened.  Ask your health care provider whether you should take a calcium or vitamin D supplement to lower your risk for osteoporosis.  Menopause may have certain physical symptoms and risks.  Hormone replacement therapy may reduce some of these symptoms and risks. Talk to your health care provider about whether hormone replacement therapy is right for you. Follow these instructions at home:  Schedule regular health, dental, and eye exams.  Stay current with your immunizations.  Do not use any tobacco products including cigarettes, chewing tobacco, or electronic cigarettes.  If you are pregnant, do not drink alcohol.  If you are breastfeeding, limit how much and how often you drink  alcohol.  Limit alcohol intake to no more than 1 drink per day for nonpregnant women. One drink equals 12 ounces of beer, 5 ounces of wine, or 1 ounces of hard liquor.  Do not use street drugs.  Do not share needles.  Ask your health care provider for help if you need support or information about quitting drugs.  Tell your health care provider if you often feel depressed.  Tell your health care provider if you have ever been abused or do not feel safe at home. This information is not intended to replace advice given to you by your health care provider. Make sure you discuss any questions you have with your health care provider. Document Released: 12/23/2010 Document Revised: 11/15/2015 Document Reviewed: 03/13/2015 Elsevier Interactive Patient Education  [employer].

## 2017-08-04 NOTE — Assessment & Plan Note (Signed)
Chronic, stable. Reviewed with patient. H/o hypertensive nephropathy. Discussed importance of good hydration status as well as avoiding NSAIDs

## 2017-08-04 NOTE — Assessment & Plan Note (Addendum)
Reviewed with patient. Avoid added sugars in diet.

## 2017-08-04 NOTE — Assessment & Plan Note (Signed)
Followed by cardiology 

## 2017-08-04 NOTE — Assessment & Plan Note (Signed)
Chronic, stable off statin. The 10-year ASCVD risk score [name], et al., 2013) is: 17.9%   Values used to calculate the score:     Age: 77 years     Sex: Female     Is Non-Hispanic African American: No     Diabetic: No     Tobacco smoker: No     Systolic Blood Pressure: 532 mmHg     Is BP treated: Yes     HDL Cholesterol: 54.7 mg/dL     Total Cholesterol: 170 mg/dL

## 2017-08-04 NOTE — Assessment & Plan Note (Signed)
Chronic, stable. Sounds regular today. Continue coumadin

## 2017-08-04 NOTE — Assessment & Plan Note (Signed)
Ongoing, has seen cards and pulm. Completing pulm rehab. Mild improvement endorsed.

## 2017-08-04 NOTE — Progress Notes (Signed)
BP 118/64 (BP Location: Left Arm, Patient Position: Sitting, Cuff Size: Normal)   Pulse 76   Temp 97.9 F (36.6 C) (Oral)   Ht 5\' 4"  (1.626 m)   Wt 144 lb 12 oz (65.7 kg)   LMP  (LMP Unknown) Comment: menopause age 77  SpO2 98%   BMI 24.85 kg/m    CC: AMW f/u visit Subjective:    Patient ID: Elizabeth Hall, female    DOB: 06/06/1941, 77 y.o.   MRN: [id number]  HPI: Elizabeth Hall is a 77 y.o. female presenting on 08/04/2017 for Annual Exam (Pt 2.)   Saw [name] last month for medicare wellness visit. Note reviewed.  Has been going to pulm rehab three times weekly for chronic exertional dyspnea - last session is tomorrow.  Seeing Dr Fletcher Anon and Dr Alva Garnet - overall good report. Planned f/u with cards 10/2017. Noticeable going up 1 flight of stairs or carrying heavy materials.  She is weaning herself off estrace.   Preventative: COLONOSCOPY Date: 03/2014 diverticulosis, int hem, rpt 5 yrs for h/o polyps ([place]) Well woman - 07/2016. Gets every 2 years. Followed by Abel Presto. Planning to see new GYN.  Mammogram - 07/2016 normal (needed rpt dx L sided mammo).  DEXA - pending  Yearly flu shot Tetanus - 2008 Pneumovax 2017, prevnar 05/2014 zostavax - declines.  shingrix - discussed Advanced directive - Scanned 08/2015. Husband [name] then daughter [name] are [place]. Declines prolonged life support if terminal condition. [place] with temporary life support for reversible condition. POST form completed and in chart. MOST form provided today for patient to review. Seat belt use discussed.  Sunscreen use discussed. No changing moles on skin. Personal h/o melanoma. Sees derm regularly (Dr Nicole Kindred). Ex smoker  Alcohol - occasional wine  Lives with husband, 1 cat Occupation: retired Therapist, sports (2007), worked for [employer] Edu: nursing school Activity: keeps 66 yo grandson Diet: good water, fruits/vegetables   Relevant past medical, surgical, family and social history reviewed and updated as  indicated. Interim medical history since our last visit reviewed. Allergies and medications reviewed and updated. Outpatient Medications Prior to Visit  Medication Sig Dispense Refill  . acetaminophen (TYLENOL) 500 MG tablet Take 500-1,000 mg by mouth every 6 (six) hours as needed (for headaches/pain.).    [name] aspirin EC 81 MG tablet Take 81 mg by mouth daily.    . bumetanide (BUMEX) 2 MG tablet TAKE 1 TABLET (2 MG TOTAL) BY MOUTH DAILY. 90 tablet 1  . carvedilol (COREG) 12.5 MG tablet Take 1 tablet (12.5 mg total) by mouth 2 (two) times daily with a meal. 180 tablet 3  . estradiol (ESTRACE) 1 MG tablet Take 0.5 mg by mouth 2 (two) times a week.     . latanoprost (XALATAN) 0.005 % ophthalmic solution Place 1 drop into both eyes at bedtime.  99  . Magnesium 250 MG TABS Take 250 mg by mouth daily.    . meclizine (ANTIVERT) 25 MG tablet Take 1 tablet (25 mg total) by mouth 2 (two) times daily as needed for dizziness. 180 tablet 0  . Polyethyl Glycol-Propyl Glycol (LUBRICANT EYE DROPS) 0.4-0.3 % SOLN Place 1 drop into both eyes 3 (three) times daily as needed (for dry eyes.).    [name] sacubitril-valsartan (ENTRESTO) 24-26 MG Take 1 tablet 2 (two) times daily by mouth. 180 tablet 3  . spironolactone (ALDACTONE) 25 MG tablet TAKE 1/2 TABLET(12.5 MG) BY MOUTH DAILY 30 tablet 3  . vitamin E 400 UNIT capsule  Take 400 Units by mouth daily.    [name] warfarin (COUMADIN) 3 MG tablet Take as directed by [facility] 90 tablet 1   No facility-administered medications prior to visit.      Per HPI unless specifically indicated in ROS section below Review of Systems     Objective:    BP 118/64 (BP Location: Left Arm, Patient Position: Sitting, Cuff Size: Normal)   Pulse 76   Temp 97.9 F (36.6 C) (Oral)   Ht 5\' 4"  (1.626 m)   Wt 144 lb 12 oz (65.7 kg)   LMP  (LMP Unknown) Comment: menopause age 20  SpO2 98%   BMI 24.85 kg/m   Wt Readings from Last 3 Encounters:  08/04/17 144 lb 12 oz (65.7 kg)    07/27/17 149 lb 14.4 oz (68 kg)  07/20/17 146 lb (66.2 kg)    Physical Exam  Constitutional: She is oriented to person, place, and time. She appears well-developed and well-nourished. No distress.  HENT:  Head: Normocephalic and atraumatic.  Right Ear: Hearing, tympanic membrane, external ear and ear canal normal.  Left Ear: Hearing, tympanic membrane, external ear and ear canal normal.  Nose: Nose normal.  Mouth/Throat: Uvula is midline, oropharynx is clear and moist and mucous membranes are normal. No oropharyngeal exudate, posterior oropharyngeal edema or posterior oropharyngeal erythema.  Eyes: Conjunctivae and EOM are normal. Pupils are equal, round, and reactive to light. No scleral icterus.  Neck: Normal range of motion. Neck supple. Carotid bruit is not present. No thyromegaly present.  Cardiovascular: Normal rate, regular rhythm, normal heart sounds and intact distal pulses.  No murmur heard. Pulses:      Radial pulses are 2+ on the right side, and 2+ on the left side.  Pulmonary/Chest: Effort normal and breath sounds normal. No respiratory distress. She has no wheezes. She has no rales.  Abdominal: Soft. Bowel sounds are normal. She exhibits no distension and no mass. There is no tenderness. There is no rebound and no guarding.  Musculoskeletal: Normal range of motion. She exhibits no edema.  Lymphadenopathy:    She has no cervical adenopathy.  Neurological: She is alert and oriented to person, place, and time.  CN grossly intact, station and gait intact  Skin: Skin is warm and dry. No rash noted.  Psychiatric: She has a normal mood and affect. Her behavior is normal. Judgment and thought content normal.  Nursing note and vitals reviewed.  Results for orders placed or performed in visit on 08/03/17  POCT INR  Result Value Ref Range   INR 2.3       Assessment & Plan:   Problem List Items Addressed This Visit    Advanced care planning/counseling discussion - Primary     Advanced directive - Scanned 08/2015. Husband [name] then daughter [name] are [place]. Declines prolonged life support if terminal condition. [place] with temporary life support for reversible condition. POST form completed and in chart. MOST form provided today for patient to review.      Atrial fibrillation (HCC)    Chronic, stable. Sounds regular today. Continue coumadin       Chronic systolic heart failure ([place])    Followed by cardiology      CKD (chronic kidney disease) stage 3, GFR 30-59 ml/min (HCC)    Chronic, stable. Reviewed with patient. H/o hypertensive nephropathy. Discussed importance of good hydration status as well as avoiding NSAIDs      Dyslipidemia    Chronic, stable off statin. The 10-year ASCVD  risk score Mikey Bussing DC Jr., et al., 2013) is: 17.9%   Values used to calculate the score:     Age: 24 years     Sex: Female     Is Non-Hispanic African American: No     Diabetic: No     Tobacco smoker: No     Systolic Blood Pressure: 007 mmHg     Is BP treated: Yes     HDL Cholesterol: 54.7 mg/dL     Total Cholesterol: 170 mg/dL       Exertional dyspnea    Ongoing, has seen cards and pulm. Completing pulm rehab. Mild improvement endorsed.      Hypertension    Chronic, bp well controlled - continue current regimen.       Postmenopausal HRT (hormone replacement therapy)    Ongoing slow taper off.      Prediabetes    Reviewed with patient. Avoid added sugars in diet.           No orders of the defined types were placed in this encounter.  No orders of the defined types were placed in this encounter.   Follow up plan: Return in about 1 year (around 08/04/2018).  Ria Bush, MD

## 2017-08-04 NOTE — Assessment & Plan Note (Signed)
Chronic, bp well controlled - continue current regimen.

## 2017-08-04 NOTE — Assessment & Plan Note (Signed)
Ongoing slow taper off.

## 2017-08-04 NOTE — Patient Instructions (Signed)
Discharge Patient Instructions  Patient Details  Name: Elizabeth Hall MRN: [id number] Date of Birth: 02-24-1941 Referring Provider:  Wilhelmina Mcardle, MD   Number of Visits: 36/36  Reason for Discharge:  Patient reached a stable level of exercise. Patient independent in their exercise. Patient has met program and personal goals.  Smoking History:  Social History   Tobacco Use  Smoking Status Former Smoker  . Packs/day: 0.50  . Years: 20.00  . Pack years: 10.00  . Types: Cigarettes  . Last attempt to quit: 02/04/1990  . Years since quitting: 27.5  Smokeless Tobacco Never Used    Diagnosis:  Chronic congestive heart failure, unspecified heart failure type [facility])  Initial Exercise Prescription: Initial Exercise Prescription - 04/20/17 1500      Date of Initial Exercise RX and Referring Provider   Date  04/20/17    Referring Provider  [name] MD      Treadmill   MPH  2.1    Grade  0    Minutes  15    METs  2.61      NuStep   Level  2    SPM  80    Minutes  15    METs  2.6      REL-XR   Level  1    Speed  50    Minutes  15    METs  2.6      Prescription Details   Frequency (times per week)  3    Duration  Progress to 45 minutes of aerobic exercise without signs/symptoms of physical distress      Intensity   THRR 40-80% of Max Heartrate  103-132    Ratings of Perceived Exertion  11-13    Perceived Dyspnea  0-4      Progression   Progression  Continue to progress workloads to maintain intensity without signs/symptoms of physical distress.      Resistance Training   Training Prescription  Yes    Weight  3 lbs    Reps  10-15       Discharge Exercise Prescription (Final Exercise Prescription Changes): Exercise Prescription Changes - 07/23/17 1100      Response to Exercise   Blood Pressure (Admit)  128/70    Blood Pressure (Exit)  124/64    Heart Rate (Admit)  75 bpm    Heart Rate (Exercise)  140 bpm    Heart Rate (Exit)  95 bpm     Oxygen Saturation (Admit)  100 %    Oxygen Saturation (Exercise)  95 %    Oxygen Saturation (Exit)  97 %    Rating of Perceived Exertion (Exercise)  12    Perceived Dyspnea (Exercise)  1    Symptoms  none    Duration  Continue with 45 min of aerobic exercise without signs/symptoms of physical distress.    Intensity  THRR unchanged      Progression   Progression  Continue to progress workloads to maintain intensity without signs/symptoms of physical distress.    Average METs  4      Resistance Training   Training Prescription  Yes    Weight  4 lb    Reps  10-15      Interval Training   Interval Training  No      Treadmill   MPH  3.3    Grade  3.5    Minutes  15    METs  5.12  NuStep   Level  7    SPM  77    Minutes  15    METs  2.9      Home Exercise Plan   Plans to continue exercise at  Home (comment) walking at home.    Frequency  Add 2 additional days to program exercise sessions.    Initial Home Exercises Provided  05/01/17       Functional Capacity: 6 Minute Walk    Row Name 04/20/17 1553 07/27/17 1107       6 Minute Walk   Phase  Initial  Discharge    Distance  1260 feet  1510 feet    Distance % Change  -  20 %    Distance Feet Change  -  250 ft    Walk Time  6 minutes  6 minutes    # of Rest Breaks  0  0    MPH  2.39  2.86    METS  2.6  3.45    RPE  11  11    Perceived Dyspnea   1.5  1    VO2 Peak  9.09  12.06    Symptoms  No  No    Resting HR  74 bpm  75 bpm    Resting BP  122/70  110/64    Resting Oxygen Saturation   98 %  99 %    Exercise Oxygen Saturation  during 6 min walk  93 %  89 %    Max Ex. HR  106 bpm  130 bpm    Max Ex. BP  126/60  142/80    2 Minute Post BP  122/64  -      Interval HR   1 Minute HR  104  85    2 Minute HR  101  128    3 Minute HR  91  130    4 Minute HR  86  126    5 Minute HR  98  -    6 Minute HR  106  101    2 Minute Post HR  76  -    Interval Heart Rate?  Yes  Yes      Interval Oxygen   Interval  Oxygen?  Yes  Yes    Baseline Oxygen Saturation %  98 %  99 %    1 Minute Oxygen Saturation %  94 %  89 %    1 Minute Liters of Oxygen  0 L Room Air  0 L    2 Minute Oxygen Saturation %  94 %  92 %    2 Minute Liters of Oxygen  0 L  0 L    3 Minute Oxygen Saturation %  95 %  91 %    3 Minute Liters of Oxygen  0 L  0 L    4 Minute Oxygen Saturation %  94 %  93 %    4 Minute Liters of Oxygen  0 L  0 L    5 Minute Oxygen Saturation %  94 %  -    5 Minute Liters of Oxygen  0 L  0 L    6 Minute Oxygen Saturation %  93 %  99 %    6 Minute Liters of Oxygen  0 L  0 L    2 Minute Post Oxygen Saturation %  100 %  -    2 Minute Post Liters of Oxygen  0 L  -       Quality of Life:   Personal Goals: Goals established at orientation with interventions provided to work toward goal. Personal Goals and Risk Factors at Admission - 04/20/17 1505      Core Components/Risk Factors/Patient Goals on Admission    Weight Management  Yes;Weight Loss    Intervention  Weight Management: Develop a combined nutrition and exercise program designed to reach desired caloric intake, while maintaining appropriate intake of nutrient and fiber, sodium and fats, and appropriate energy expenditure required for the weight goal.;Weight Management: Provide education and appropriate resources to help participant work on and attain dietary goals.;Weight Management/Obesity: Establish reasonable short term and [place] term weight goals.    Admit Weight  153 lb 11.2 oz (69.7 kg)    Goal Weight: Short Term  148 lb (67.1 kg)    Goal Weight: [place] Term  135 lb (61.2 kg)    Expected Outcomes  [place] Term: Adherence to nutrition and physical activity/exercise program aimed toward attainment of established weight goal;Weight Maintenance: Understanding of the daily nutrition guidelines, which includes 25-35% calories from fat, 7% or less cal from saturated fats, less than '200mg'$  cholesterol, less than 1.5gm of sodium, & 5 or more servings of  fruits and vegetables daily;Short Term: Continue to assess and modify interventions until short term weight is achieved;Weight Loss: Understanding of general recommendations for a balanced deficit meal plan, which promotes 1-2 lb weight loss per week and includes a negative energy balance of [phone] kcal/d;Understanding recommendations for meals to include 15-35% energy as protein, 25-35% energy from fat, 35-60% energy from carbohydrates, less than '200mg'$  of dietary cholesterol, 20-35 gm of total fiber daily;Understanding of distribution of calorie intake throughout the day with the consumption of 4-5 meals/snacks    Improve shortness of breath with ADL's  Yes    Intervention  Provide education, individualized exercise plan and daily activity instruction to help decrease symptoms of SOB with activities of daily living.    Expected Outcomes  Short Term: Achieves a reduction of symptoms when performing activities of daily living.    Heart Failure  Yes    Intervention  Provide a combined exercise and nutrition program that is supplemented with education, support and counseling about heart failure. Directed toward relieving symptoms such as shortness of breath, decreased exercise tolerance, and extremity edema.    Expected Outcomes  Improve functional capacity of life;Short term: Attendance in program 2-3 days a week with increased exercise capacity. Reported lower sodium intake. Reported increased fruit and vegetable intake. Reports medication compliance.;Short term: Daily weights obtained and reported for increase. Utilizing diuretic protocols set by physician.;[place] term: Adoption of self-care skills and reduction of barriers for early signs and symptoms recognition and intervention leading to self-care maintenance.    Hypertension  Yes    Intervention  Provide education on lifestyle modifcations including regular physical activity/exercise, weight management, moderate sodium restriction and increased  consumption of fresh fruit, vegetables, and low fat dairy, alcohol moderation, and smoking cessation.;Monitor prescription use compliance.    Expected Outcomes  [place] Term: Maintenance of blood pressure at goal levels.;Short Term: Continued assessment and intervention until BP is < 140/71m HG in hypertensive participants. < 130/86mHG in hypertensive participants with diabetes, heart failure or chronic kidney disease.    Stress  Yes    Intervention  Offer individual and/or small group education and counseling on adjustment to heart disease, stress management and health-related lifestyle change. Teach and support self-help strategies.;Refer participants experiencing  significant psychosocial distress to appropriate mental health specialists for further evaluation and treatment. When possible, include family members and significant others in education/counseling sessions.    Expected Outcomes  Short Term: Participant demonstrates changes in health-related behavior, relaxation and other stress management skills, ability to obtain effective social support, and compliance with psychotropic medications if prescribed.;[place] Term: Emotional wellbeing is indicated by absence of clinically significant psychosocial distress or social isolation.        Personal Goals Discharge: Goals and Risk Factor Review - 07/17/17 1422      Core Components/Risk Factors/Patient Goals Review   Personal Goals Review  Weight Management/Obesity;Hypertension;Improve shortness of breath with ADL's    Review  Elizabeth Hall has been checking her blood pressure at home and it has been reading around 100/70 for her resting blood pressure. She still would like to lose some weight. Her breathing has improved at home while doing her daily activities.    Expected Outcomes  Short: attend [name] to lose weight. [place]: maintain weight after weight loss.       Exercise Goals and Review: Exercise Goals    Row Name 04/20/17 1558              Exercise Goals   Increase Physical Activity  Yes       Intervention  Provide advice, education, support and counseling about physical activity/exercise needs.;Develop an individualized exercise prescription for aerobic and resistive training based on initial evaluation findings, risk stratification, comorbidities and participant's personal goals.       Expected Outcomes  Achievement of increased cardiorespiratory fitness and enhanced flexibility, muscular endurance and strength shown through measurements of functional capacity and personal statement of participant.       Increase Strength and Stamina  Yes       Intervention  Provide advice, education, support and counseling about physical activity/exercise needs.;Develop an individualized exercise prescription for aerobic and resistive training based on initial evaluation findings, risk stratification, comorbidities and participant's personal goals.       Expected Outcomes  Achievement of increased cardiorespiratory fitness and enhanced flexibility, muscular endurance and strength shown through measurements of functional capacity and personal statement of participant.       Able to understand and use rate of perceived exertion (RPE) scale  Yes       Intervention  Provide education and explanation on how to use RPE scale       Expected Outcomes  Short Term: Able to use RPE daily in rehab to express subjective intensity level;[place] Term:  Able to use RPE to guide intensity level when exercising independently       Able to understand and use Dyspnea scale  Yes       Intervention  Provide education and explanation on how to use Dyspnea scale       Expected Outcomes  Short Term: Able to use Dyspnea scale daily in rehab to express subjective sense of shortness of breath during exertion;[place] Term: Able to use Dyspnea scale to guide intensity level when exercising independently       Knowledge and understanding of Target Heart Rate Range (THRR)  Yes        Intervention  Provide education and explanation of THRR including how the numbers were predicted and where they are located for reference       Expected Outcomes  Short Term: Able to state/look up THRR;[place] Term: Able to use THRR to govern intensity when exercising independently;Short Term: Able to use daily as guideline for intensity  in rehab       Able to check pulse independently  Yes       Intervention  Provide education and demonstration on how to check pulse in carotid and radial arteries.;Review the importance of being able to check your own pulse for safety during independent exercise       Expected Outcomes  Short Term: Able to explain why pulse checking is important during independent exercise;[place] Term: Able to check pulse independently and accurately       Understanding of Exercise Prescription  Yes       Intervention  Provide education, explanation, and written materials on patient's individual exercise prescription       Expected Outcomes  Short Term: Able to explain program exercise prescription;[place] Term: Able to explain home exercise prescription to exercise independently          Nutrition & Weight - Outcomes: Pre Biometrics - 04/20/17 1558      Pre Biometrics   Height  5' 3.5" (1.613 m)    Weight  153 lb 11.2 oz (69.7 kg)    Waist Circumference  34 inches    Hip Circumference  39.75 inches    Waist to Hip Ratio  0.86 %    BMI (Calculated)  26.8      Post Biometrics - 07/27/17 1121       Post  Biometrics   Height  5' 3.5" (1.613 m)    Weight  149 lb 14.4 oz (68 kg)    Waist Circumference  34 inches    Hip Circumference  39.5 inches    Waist to Hip Ratio  0.86 %    BMI (Calculated)  26.13       Nutrition: Nutrition Therapy & Goals - 07/13/17 1032      Nutrition Therapy   Diet  TLC    Drug/Food Interactions  Coumadin/Vit K    Protein (specify units)  50g    Fiber  25 grams    Saturated Fats  10 max. grams    Fruits and Vegetables  6 servings/day    Sodium   1500 grams      Personal Nutrition Goals   Nutrition Goal  Practice reading the nutrition facts label and ingredients list more often; identify foods high/low in whole grains, sugar, sodium    Personal Goal #2  Consume more whole food-based snack options, such as fruit as mentioned    Personal Goal #3  Monitor portion sizes, use [employer] program as a tool to help you learn proper portions    Comments  She has joined [employer] 2 weeks ago to help with slight weight reduction      Intervention Plan   Intervention  Prescribe, educate and counsel regarding individualized specific dietary modifications aiming towards targeted core components such as weight, hypertension, lipid management, diabetes, heart failure and other comorbidities.;Nutrition handout(s) given to patient. Coumadin/Warfarin dietary interactions handout provided    Expected Outcomes  Short Term Goal: Understand basic principles of dietary content, such as calories, fat, sodium, cholesterol and nutrients.;Short Term Goal: A plan has been developed with personal nutrition goals set during dietitian appointment.;[place] Term Goal: Adherence to prescribed nutrition plan.       Nutrition Discharge: Nutrition Assessments - 07/29/17 1422      MEDFICTS Scores   Pre Score  20       Education Questionnaire Score: Knowledge Questionnaire Score - 07/29/17 1419      Knowledge Questionnaire Score   Pre Score  9/10    Post Score  9/10 reviewed with patient       Goals reviewed with patient; copy given to patient.

## 2017-08-04 NOTE — Assessment & Plan Note (Addendum)
Advanced directive - Scanned 08/2015. Husband [name] then daughter [name] are [place]. Declines prolonged life support if terminal condition. [place] with temporary life support for reversible condition. POST form completed and in chart. MOST form provided today for patient to review.

## 2017-08-05 DIAGNOSIS — Z79899 Other long term (current) drug therapy: Secondary | ICD-10-CM | POA: Diagnosis not present

## 2017-08-05 DIAGNOSIS — I5022 Chronic systolic (congestive) heart failure: Secondary | ICD-10-CM | POA: Diagnosis not present

## 2017-08-05 DIAGNOSIS — I509 Heart failure, unspecified: Secondary | ICD-10-CM

## 2017-08-05 DIAGNOSIS — I130 Hypertensive heart and chronic kidney disease with heart failure and stage 1 through stage 4 chronic kidney disease, or unspecified chronic kidney disease: Secondary | ICD-10-CM | POA: Diagnosis not present

## 2017-08-05 DIAGNOSIS — Z7951 Long term (current) use of inhaled steroids: Secondary | ICD-10-CM | POA: Diagnosis not present

## 2017-08-05 DIAGNOSIS — Z7982 Long term (current) use of aspirin: Secondary | ICD-10-CM | POA: Diagnosis not present

## 2017-08-05 DIAGNOSIS — N183 Chronic kidney disease, stage 3 (moderate): Secondary | ICD-10-CM | POA: Diagnosis not present

## 2017-08-05 NOTE — Progress Notes (Signed)
Discharge Progress Report  Patient Details  Name: Elizabeth Hall MRN: [id number] Date of Birth: 11/03/1940 Referring Provider:     Pulmonary Rehab from 04/20/2017 in [facility]  Referring Provider  [name] MD       Number of Visits: 36/36  Reason for Discharge:  Patient reached a stable level of exercise. Patient independent in their exercise. Patient has met program and personal goals.  Smoking History:  Social History   Tobacco Use  Smoking Status Former Smoker  . Packs/day: 0.50  . Years: 20.00  . Pack years: 10.00  . Types: Cigarettes  . Last attempt to quit: 02/04/1990  . Years since quitting: 27.5  Smokeless Tobacco Never Used    Diagnosis:  Chronic congestive heart failure, unspecified heart failure type [facility])  ADL UCSD: Pulmonary Assessment Scores    Row Name 04/20/17 1453 06/15/17 1142 07/29/17 1422     ADL UCSD   ADL Phase  Entry  Mid  Exit   SOB Score total  42  32  17   Rest  0  0  0   Walk  1  0  0   Stairs  _0 Bath  1  1  0   Dress  2  0  0   Shop  3  2  0     CAT Score   CAT Score  12  -  8     mMRC Score   mMRC Score  3  -  -      Initial Exercise Prescription: Initial Exercise Prescription - 04/20/17 1500      Date of Initial Exercise RX and Referring Provider   Date  04/20/17    Referring Provider  [name] MD      Treadmill   MPH  2.1    Grade  0    Minutes  15    METs  2.61      NuStep   Level  2    SPM  80    Minutes  15    METs  2.6      REL-XR   Level  1    Speed  50    Minutes  15    METs  2.6      Prescription Details   Frequency (times per week)  3    Duration  Progress to 45 minutes of aerobic exercise without signs/symptoms of physical distress      Intensity   THRR 40-80% of Max Heartrate  103-132    Ratings of Perceived Exertion  11-13    Perceived Dyspnea  0-4      Progression   Progression  Continue to progress workloads to maintain intensity  without signs/symptoms of physical distress.      Resistance Training   Training Prescription  Yes    Weight  3 lbs    Reps  10-15       Discharge Exercise Prescription (Final Exercise Prescription Changes): Exercise Prescription Changes - 07/23/17 1100      Response to Exercise   Blood Pressure (Admit)  128/70    Blood Pressure (Exit)  124/64    Heart Rate (Admit)  75 bpm    Heart Rate (Exercise)  140 bpm    Heart Rate (Exit)  95 bpm    Oxygen Saturation (Admit)  100 %    Oxygen Saturation (Exercise)  95 %    Oxygen Saturation (  Exit)  97 %    Rating of Perceived Exertion (Exercise)  12    Perceived Dyspnea (Exercise)  1    Symptoms  none    Duration  Continue with 45 min of aerobic exercise without signs/symptoms of physical distress.    Intensity  THRR unchanged      Progression   Progression  Continue to progress workloads to maintain intensity without signs/symptoms of physical distress.    Average METs  4      Resistance Training   Training Prescription  Yes    Weight  4 lb    Reps  10-15      Interval Training   Interval Training  No      Treadmill   MPH  3.3    Grade  3.5    Minutes  15    METs  5.12      NuStep   Level  7    SPM  77    Minutes  15    METs  2.9      Home Exercise Plan   Plans to continue exercise at  Home (comment) walking at home.    Frequency  Add 2 additional days to program exercise sessions.    Initial Home Exercises Provided  05/01/17       Functional Capacity: 6 Minute Walk    Row Name 04/20/17 1553 07/27/17 1107       6 Minute Walk   Phase  Initial  Discharge    Distance  1260 feet  1510 feet    Distance % Change  -  20 %    Distance Feet Change  -  250 ft    Walk Time  6 minutes  6 minutes    # of Rest Breaks  0  0    MPH  2.39  2.86    METS  2.6  3.45    RPE  11  11    Perceived Dyspnea   1.5  1    VO2 Peak  9.09  12.06    Symptoms  No  No    Resting HR  74 bpm  75 bpm    Resting BP  122/70  110/64     Resting Oxygen Saturation   98 %  99 %    Exercise Oxygen Saturation  during 6 min walk  93 %  89 %    Max Ex. HR  106 bpm  130 bpm    Max Ex. BP  126/60  142/80    2 Minute Post BP  122/64  -      Interval HR   1 Minute HR  104  85    2 Minute HR  101  128    3 Minute HR  91  130    4 Minute HR  86  126    5 Minute HR  98  -    6 Minute HR  106  101    2 Minute Post HR  76  -    Interval Heart Rate?  Yes  Yes      Interval Oxygen   Interval Oxygen?  Yes  Yes    Baseline Oxygen Saturation %  98 %  99 %    1 Minute Oxygen Saturation %  94 %  89 %    1 Minute Liters of Oxygen  0 L Room Air  0 L    2 Minute Oxygen Saturation %  94 %  92 %    2 Minute Liters of Oxygen  0 L  0 L    3 Minute Oxygen Saturation %  95 %  91 %    3 Minute Liters of Oxygen  0 L  0 L    4 Minute Oxygen Saturation %  94 %  93 %    4 Minute Liters of Oxygen  0 L  0 L    5 Minute Oxygen Saturation %  94 %  -    5 Minute Liters of Oxygen  0 L  0 L    6 Minute Oxygen Saturation %  93 %  99 %    6 Minute Liters of Oxygen  0 L  0 L    2 Minute Post Oxygen Saturation %  100 %  -    2 Minute Post Liters of Oxygen  0 L  -       Psychological, QOL, Others - Outcomes: PHQ 2/9: Depression screen [facility] 2/9 07/29/2017 07/20/2017 04/20/2017 07/07/2016 07/02/2015  Decreased Interest 0 0 0 0 0  Down, Depressed, Hopeless 0 0 0 0 0  PHQ - 2 Score 0 0 0 0 0  Altered sleeping 0 0 1 - -  Tired, decreased energy 0 0 1 - -  Change in appetite 0 0 1 - -  Feeling bad or failure about yourself  0 0 0 - -  Trouble concentrating 0 0 0 - -  Moving slowly or fidgety/restless 0 0 0 - -  Suicidal thoughts 0 0 0 - -  PHQ-9 Score 0 0 3 - -  Difficult doing work/chores - Not difficult at all Not difficult at all - -    Quality of Life:   Personal Goals: Goals established at orientation with interventions provided to work toward goal. Personal Goals and Risk Factors at Admission - 04/20/17 1505      Core Components/Risk  Factors/Patient Goals on Admission    Weight Management  Yes;Weight Loss    Intervention  Weight Management: Develop a combined nutrition and exercise program designed to reach desired caloric intake, while maintaining appropriate intake of nutrient and fiber, sodium and fats, and appropriate energy expenditure required for the weight goal.;Weight Management: Provide education and appropriate resources to help participant work on and attain dietary goals.;Weight Management/Obesity: Establish reasonable short term and K[place] term weight goals.    Admit Weight  153 lb 11.2 oz (69.7 kg)    Goal Weight: Short Term  148 lb (67.1 kg)    Goal Weight: Rufer Term  135 lb (61.2 kg)    Expected Outcomes  Parkey Term: Adherence to nutrition and physical activity/exercise program aimed toward attainment of established weight goal;Weight Maintenance: Understanding of the daily nutrition guidelines, which includes 25-35% calories from fat, 7% or less cal from saturated fats, less than '200mg'$  cholesterol, less than 1.5gm of sodium, & 5 or more servings of fruits and vegetables daily;Short Term: Continue to assess and modify interventions until short term weight is achieved;Weight Loss: Understanding of general recommendations for a balanced deficit meal plan, which promotes 1-2 lb weight loss per week and includes a negative energy balance of [phone] kcal/d;Understanding recommendations for meals to include 15-35% energy as protein, 25-35% energy from fat, 35-60% energy from carbohydrates, less than '200mg'$  of dietary cholesterol, 20-35 gm of total fiber daily;Understanding of distribution of calorie intake throughout the day with the consumption of 4-5 meals/snacks    Improve shortness of breath with  ADL's  Yes    Intervention  Provide education, individualized exercise plan and daily activity instruction to help decrease symptoms of SOB with activities of daily living.    Expected Outcomes  Short Term: Achieves a reduction of  symptoms when performing activities of daily living.    Heart Failure  Yes    Intervention  Provide a combined exercise and nutrition program that is supplemented with education, support and counseling about heart failure. Directed toward relieving symptoms such as shortness of breath, decreased exercise tolerance, and extremity edema.    Expected Outcomes  Improve functional capacity of life;Short term: Attendance in program 2-3 days a week with increased exercise capacity. Reported lower sodium intake. Reported increased fruit and vegetable intake. Reports medication compliance.;Short term: Daily weights obtained and reported for increase. Utilizing diuretic protocols set by physician.;Schermer term: Adoption of self-care skills and reduction of barriers for early signs and symptoms recognition and intervention leading to self-care maintenance.    Hypertension  Yes    Intervention  Provide education on lifestyle modifcations including regular physical activity/exercise, weight management, moderate sodium restriction and increased consumption of fresh fruit, vegetables, and low fat dairy, alcohol moderation, and smoking cessation.;Monitor prescription use compliance.    Expected Outcomes  Wendt Term: Maintenance of blood pressure at goal levels.;Short Term: Continued assessment and intervention until BP is < 140/36m HG in hypertensive participants. < 130/872mHG in hypertensive participants with diabetes, heart failure or chronic kidney disease.    Stress  Yes    Intervention  Offer individual and/or small group education and counseling on adjustment to heart disease, stress management and health-related lifestyle change. Teach and support self-help strategies.;Refer participants experiencing significant psychosocial distress to appropriate mental health specialists for further evaluation and treatment. When possible, include family members and significant others in education/counseling sessions.    Expected  Outcomes  Short Term: Participant demonstrates changes in health-related behavior, relaxation and other stress management skills, ability to obtain effective social support, and compliance with psychotropic medications if prescribed.;Hilgert Term: Emotional wellbeing is indicated by absence of clinically significant psychosocial distress or social isolation.        Personal Goals Discharge: Goals and Risk Factor Review    Row Name 05/20/17 1150 06/15/17 1104 07/17/17 1422         Core Components/Risk Factors/Patient Goals Review   Personal Goals Review  Weight Management/Obesity;Hypertension;Stress;Improve shortness of breath with ADL's  Weight Management/Obesity;Hypertension;Improve shortness of breath with ADL's  Weight Management/Obesity;Hypertension;Improve shortness of breath with ADL's     Review  Elizabeth Hall scheduled to meet with RD 12/10.  She states she is not as short of breath when walking and carrying.  She is not getting a lot of exrecise at home.  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  Her stress is from his illness and ongoing complications.  Elizabeth Roysas been doing well in rehab.  She has gained a little but plans to take an extra fluid pill today.  Her blood pressures have been doing good but she does not check them at home.  She plans to get the machine out and start checking more frequently for her and her husband.  She is doing better with her SOB resting.  She finds that it still happens with heavier exertion, but has gotten a lot better since starting program.    Elizabeth Roysas been checking her blood pressure at home and it has been reading around 100/70 for her resting blood pressure. She  still would like to lose some weight. Her breathing has improved at home while doing her daily activities.     Expected Outcomes  Short - Elizabeth Hall will meet with RD and staff will review home exercise with her.  Soller - Elizabeth Hall will begin more healthy eating and add exercise at home.  Short: Get out  blood pressure cuff and start checking at home.  Mathies: Continue to work on weight loss and risk factors.   Short: attend [name] to lose weight. Rase: maintain weight after weight loss.        Exercise Goals and Review: Exercise Goals    Row Name 04/20/17 1558             Exercise Goals   Increase Physical Activity  Yes       Intervention  Provide advice, education, support and counseling about physical activity/exercise needs.;Develop an individualized exercise prescription for aerobic and resistive training based on initial evaluation findings, risk stratification, comorbidities and participant's personal goals.       Expected Outcomes  Achievement of increased cardiorespiratory fitness and enhanced flexibility, muscular endurance and strength shown through measurements of functional capacity and personal statement of participant.       Increase Strength and Stamina  Yes       Intervention  Provide advice, education, support and counseling about physical activity/exercise needs.;Develop an individualized exercise prescription for aerobic and resistive training based on initial evaluation findings, risk stratification, comorbidities and participant's personal goals.       Expected Outcomes  Achievement of increased cardiorespiratory fitness and enhanced flexibility, muscular endurance and strength shown through measurements of functional capacity and personal statement of participant.       Able to understand and use rate of perceived exertion (RPE) scale  Yes       Intervention  Provide education and explanation on how to use RPE scale       Expected Outcomes  Short Term: Able to use RPE daily in rehab to express subjective intensity level;Mifflin Term:  Able to use RPE to guide intensity level when exercising independently       Able to understand and use Dyspnea scale  Yes       Intervention  Provide education and explanation on how to use Dyspnea scale       Expected Outcomes  Short Term:  Able to use Dyspnea scale daily in rehab to express subjective sense of shortness of breath during exertion;Stockert Term: Able to use Dyspnea scale to guide intensity level when exercising independently       Knowledge and understanding of Target Heart Rate Range (THRR)  Yes       Intervention  Provide education and explanation of THRR including how the numbers were predicted and where they are located for reference       Expected Outcomes  Short Term: Able to state/look up THRR;Bayly Term: Able to use THRR to govern intensity when exercising independently;Short Term: Able to use daily as guideline for intensity in rehab       Able to check pulse independently  Yes       Intervention  Provide education and demonstration on how to check pulse in carotid and radial arteries.;Review the importance of being able to check your own pulse for safety during independent exercise       Expected Outcomes  Short Term: Able to explain why pulse checking is important during independent exercise;Faughn Term: Able to check pulse independently and accurately  Understanding of Exercise Prescription  Yes       Intervention  Provide education, explanation, and written materials on patient's individual exercise prescription       Expected Outcomes  Short Term: Able to explain program exercise prescription;Servais Term: Able to explain home exercise prescription to exercise independently          Nutrition & Weight - Outcomes: Pre Biometrics - 04/20/17 1558      Pre Biometrics   Height  5' 3.5" (1.613 m)    Weight  153 lb 11.2 oz (69.7 kg)    Waist Circumference  34 inches    Hip Circumference  39.75 inches    Waist to Hip Ratio  0.86 %    BMI (Calculated)  26.8      Post Biometrics - 07/27/17 1121       Post  Biometrics   Height  5' 3.5" (1.613 m)    Weight  149 lb 14.4 oz (68 kg)    Waist Circumference  34 inches    Hip Circumference  39.5 inches    Waist to Hip Ratio  0.86 %    BMI (Calculated)  26.13        Nutrition: Nutrition Therapy & Goals - 07/13/17 1032      Nutrition Therapy   Diet  TLC    Drug/Food Interactions  Coumadin/Vit K    Protein (specify units)  50g    Fiber  25 grams    Saturated Fats  10 max. grams    Fruits and Vegetables  6 servings/day    Sodium  1500 grams      Personal Nutrition Goals   Nutrition Goal  Practice reading the nutrition facts label and ingredients list more often; identify foods high/low in whole grains, sugar, sodium    Personal Goal #2  Consume more whole food-based snack options, such as fruit as mentioned    Personal Goal #3  Monitor portion sizes, use [employer] program as a tool to help you learn proper portions    Comments  She has joined [employer] 2 weeks ago to help with slight weight reduction      Intervention Plan   Intervention  Prescribe, educate and counsel regarding individualized specific dietary modifications aiming towards targeted core components such as weight, hypertension, lipid management, diabetes, heart failure and other comorbidities.;Nutrition handout(s) given to patient. Coumadin/Warfarin dietary interactions handout provided    Expected Outcomes  Short Term Goal: Understand basic principles of dietary content, such as calories, fat, sodium, cholesterol and nutrients.;Short Term Goal: A plan has been developed with personal nutrition goals set during dietitian appointment.;Tillman Term Goal: Adherence to prescribed nutrition plan.       Nutrition Discharge: Nutrition Assessments - 07/29/17 1422      MEDFICTS Scores   Pre Score  20       Education Questionnaire Score: Knowledge Questionnaire Score - 07/29/17 1419      Knowledge Questionnaire Score   Pre Score  9/10    Post Score  9/10 reviewed with patient       Goals reviewed with patient; copy given to patient.

## 2017-08-05 NOTE — Progress Notes (Signed)
Pulmonary Individual Treatment Plan  Patient Details  Name: Elizabeth Hall MRN: [id number] Date of Birth: 1941-06-03 Referring Provider:     Pulmonary Rehab from 04/20/2017 in [facility] Cardiac and Pulmonary Rehab  Referring Provider  [name] MD      Initial Encounter Date:    Pulmonary Rehab from 04/20/2017 in [facility] Cardiac and Pulmonary Rehab  Date  04/20/17  Referring Provider  [name] MD      Visit Diagnosis: Chronic congestive heart failure, unspecified heart failure type [facility])  Patient's Home Medications on Admission:  Current Outpatient Medications:  .  acetaminophen (TYLENOL) 500 MG tablet, Take 500-1,000 mg by mouth every 6 (six) hours as needed (for headaches/pain.)., Disp: , Rfl:  .  aspirin EC 81 MG tablet, Take 81 mg by mouth daily., Disp: , Rfl:  .  bumetanide (BUMEX) 2 MG tablet, TAKE 1 TABLET (2 MG TOTAL) BY MOUTH DAILY., Disp: 90 tablet, Rfl: 1 .  carvedilol (COREG) 12.5 MG tablet, Take 1 tablet (12.5 mg total) by mouth 2 (two) times daily with a meal., Disp: 180 tablet, Rfl: 3 .  estradiol (ESTRACE) 1 MG tablet, Take 0.5 mg by mouth 2 (two) times a week. , Disp: , Rfl:  .  latanoprost (XALATAN) 0.005 % ophthalmic solution, Place 1 drop into both eyes at bedtime., Disp: , Rfl: 99 .  Magnesium 250 MG TABS, Take 250 mg by mouth daily., Disp: , Rfl:  .  meclizine (ANTIVERT) 25 MG tablet, Take 1 tablet (25 mg total) by mouth 2 (two) times daily as needed for dizziness., Disp: 180 tablet, Rfl: 0 .  Polyethyl Glycol-Propyl Glycol (LUBRICANT EYE DROPS) 0.4-0.3 % SOLN, Place 1 drop into both eyes 3 (three) times daily as needed (for dry eyes.)., Disp: , Rfl:  .  sacubitril-valsartan (ENTRESTO) 24-26 MG, Take 1 tablet 2 (two) times daily by mouth., Disp: 180 tablet, Rfl: 3 .  spironolactone (ALDACTONE) 25 MG tablet, TAKE 1/2 TABLET(12.5 MG) BY MOUTH DAILY, Disp: 30 tablet, Rfl: 3 .  vitamin E 400 UNIT capsule, Take 400 Units by mouth daily., Disp: , Rfl:  .   warfarin (COUMADIN) 3 MG tablet, Take as directed by [facility], Disp: 90 tablet, Rfl: 1  Past Medical History: Past Medical History:  Diagnosis Date  . Bleeding of eye   . Chronic systolic heart failure ([place])    a. 11/2016 Echo: EF 35-40%, mild MR w/ nl fxning bioprosthesis, mod dil LA.  . CKD (chronic kidney disease) stage 3, GFR 30-59 ml/min (HCC)    baseline Cr 1.5-2; prior saw nephrologist thought hypertensive nephropathy/renovascular disease  . CVA (cerebral infarction)    a. 2014 occipital lobe - some vision loss when coumadin held and not bridged  . Dyslipidemia   . Glaucoma   . H. pylori infection    treated  . H/O mitral valve replacement    a. 05/2012 s/p 35m Carpentier Edwards pericardial tissue valve.  .Marland KitchenHx of rheumatic fever   . Hypertension   . [place]   L shoulder  . 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 08/2013 s/p Biotronik Ilesto 7 HF1 BiV ICD (ser# [id number];  b. 11/2016 Echo: EF 35-40%.  . Non-obstructive CAD    a. s/p MI 2001;  b. 12/2016 Cath: LM 20ost, OM2 60, RCA 30p/d, EF 35-45%. Nl R heart filling pressures.  . Permanent atrial fibrillation ([place]    a. 04/2012 s/p AVN & PPM placement (later upgraded to BiV ICD).  .Marland KitchenPhlebitis   .  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 06/2012 h/o acute ischemia due to thromboembolism radial and ulnar arteries when coumadin held s/p atherectomy - needs bridging if off coumadin  . Tricuspid regurgitation    a. 05/2012 s/p TV annuloplasty @ time of MVR.    Tobacco Use: Social History   Tobacco Use  Smoking Status Former Smoker  . Packs/day: 0.50  . Years: 20.00  . Pack years: 10.00  . Types: Cigarettes  . Last attempt to quit: 02/04/1990  . Years since quitting: 27.5  Smokeless Tobacco Never Used    Labs: Recent Chemical engineer    Labs for ITP Cardiac and Pulmonary Rehab Latest Ref Rng & Units 06/12/2014 06/27/2015 07/07/2016 07/20/2017   Cholestrol 0 - 200 mg/dL 192 160 -  170   LDLCALC 0 - 99 mg/dL 104 96 - 99   HDL >39.00 mg/dL 74(A) 51.60 - 54.70   Trlycerides 0.0 - 149.0 mg/dL 70 62.0 - 81.0   Hemoglobin A1c 4.6 - 6.5 % - 6.2 6.1 6.2       Pulmonary Assessment Scores: Pulmonary Assessment Scores    Row Name 04/20/17 1453 06/15/17 1142 07/29/17 1422     ADL UCSD   ADL Phase  Entry  Mid  Exit   SOB Score total  42  32  17   Rest  0  0  0   Walk  1  0  0   Stairs  '4  4  3   '$ Bath  1  1  0   Dress  2  0  0   Shop  3  2  0     CAT Score   CAT Score  12  -  8     mMRC Score   mMRC Score  3  -  -      Pulmonary Function Assessment: Pulmonary Function Assessment - 04/20/17 1457      Breath   Bilateral Breath Sounds  Clear    Shortness of Breath  No;Limiting activity Her shortness of breath make it more of an annoyance for her       Exercise Target Goals:    Exercise Program Goal: Individual exercise prescription set using results from initial 6 min walk test and THRR while considering  patient's activity barriers and safety.    Exercise Prescription Goal: Initial exercise prescription builds to 30-45 minutes a day of aerobic activity, 2-3 days per week.  Home exercise guidelines will be given to patient during program as part of exercise prescription that the participant will acknowledge.  Activity Barriers & Risk Stratification: Activity Barriers & Cardiac Risk Stratification - 04/20/17 1555      Activity Barriers & Cardiac Risk Stratification   Activity Barriers  Muscular Weakness;Shortness of Breath;Deconditioning;Balance Concerns;Decreased Ventricular Function       6 Minute Walk: 6 Minute Walk    Row Name 04/20/17 1553 07/27/17 1107       6 Minute Walk   Phase  Initial  Discharge    Distance  1260 feet  1510 feet    Distance % Change  -  20 %    Distance Feet Change  -  250 ft    Walk Time  6 minutes  6 minutes    # of Rest Breaks  0  0    MPH  2.39  2.86    METS  2.6  3.45    RPE  11  11  Perceived Dyspnea    1.5  1    VO2 Peak  9.09  12.06    Symptoms  No  No    Resting HR  74 bpm  75 bpm    Resting BP  122/70  110/64    Resting Oxygen Saturation   98 %  99 %    Exercise Oxygen Saturation  during 6 min walk  93 %  89 %    Max Ex. HR  106 bpm  130 bpm    Max Ex. BP  126/60  142/80    2 Minute Post BP  122/64  -      Interval HR   1 Minute HR  104  85    2 Minute HR  101  128    3 Minute HR  91  130    4 Minute HR  86  126    5 Minute HR  98  -    6 Minute HR  106  101    2 Minute Post HR  76  -    Interval Heart Rate?  Yes  Yes      Interval Oxygen   Interval Oxygen?  Yes  Yes    Baseline Oxygen Saturation %  98 %  99 %    1 Minute Oxygen Saturation %  94 %  89 %    1 Minute Liters of Oxygen  0 L Room Air  0 L    2 Minute Oxygen Saturation %  94 %  92 %    2 Minute Liters of Oxygen  0 L  0 L    3 Minute Oxygen Saturation %  95 %  91 %    3 Minute Liters of Oxygen  0 L  0 L    4 Minute Oxygen Saturation %  94 %  93 %    4 Minute Liters of Oxygen  0 L  0 L    5 Minute Oxygen Saturation %  94 %  -    5 Minute Liters of Oxygen  0 L  0 L    6 Minute Oxygen Saturation %  93 %  99 %    6 Minute Liters of Oxygen  0 L  0 L    2 Minute Post Oxygen Saturation %  100 %  -    2 Minute Post Liters of Oxygen  0 L  -      Oxygen Initial Assessment: Oxygen Initial Assessment - 04/20/17 1504      Home Oxygen   Home Oxygen Device  None    Sleep Oxygen Prescription  None    Home Exercise Oxygen Prescription  None    Home at Rest Exercise Oxygen Prescription  None      Initial 6 min Walk   Oxygen Used  None      Program Oxygen Prescription   Program Oxygen Prescription  None      Intervention   Short Term Goals  To learn and understand importance of maintaining oxygen saturations>88%;To learn and demonstrate proper pursed lip breathing techniques or other breathing techniques.;To learn and understand importance of monitoring SPO2 with pulse oximeter and demonstrate accurate use of the  pulse oximeter.    Seg[place]  Term Goals  Exhibits proper breathing techniques, such as pursed lip breathing or other method taught during program session;Verbalizes importance of monitoring SPO2 with pulse oximeter and return demonstration;Maintenance of O2 saturations>88%  Oxygen Re-Evaluation: Oxygen Re-Evaluation    Row Name 04/27/17 1028 05/18/17 1655 06/15/17 1113 07/17/17 1427       Program Oxygen Prescription   Program Oxygen Prescription  None  None  None  None      Home Oxygen   Home Oxygen Device  None  None  None  None    Sleep Oxygen Prescription  None  None  None  None    Home Exercise Oxygen Prescription  None  None  None  None    Home at Rest Exercise Oxygen Prescription  None  None  None  None      Goals/Expected Outcomes   Short Term Goals  To learn and understand importance of maintaining oxygen saturations>88%;To learn and demonstrate proper pursed lip breathing techniques or other breathing techniques.;To learn and understand importance of monitoring SPO2 with pulse oximeter and demonstrate accurate use of the pulse oximeter.  To learn and understand importance of maintaining oxygen saturations>88%;To learn and demonstrate proper pursed lip breathing techniques or other breathing techniques.;To learn and understand importance of monitoring SPO2 with pulse oximeter and demonstrate accurate use of the pulse oximeter.  To learn and understand importance of maintaining oxygen saturations>88%;To learn and demonstrate proper pursed lip breathing techniques or other breathing techniques.;To learn and understand importance of monitoring SPO2 with pulse oximeter and demonstrate accurate use of the pulse oximeter.  To learn and understand importance of maintaining oxygen saturations>88%;To learn and demonstrate proper pursed lip breathing techniques or other breathing techniques.;To learn and understand importance of monitoring SPO2 with pulse oximeter and demonstrate accurate use  of the pulse oximeter.    [place]  Term Goals  Exhibits proper breathing techniques, such as pursed lip breathing or other method taught during program session;Maintenance of O2 saturations>88%;Verbalizes importance of monitoring SPO2 with pulse oximeter and return demonstration  Exhibits proper breathing techniques, such as pursed lip breathing or other method taught during program session;Maintenance of O2 saturations>88%;Verbalizes importance of monitoring SPO2 with pulse oximeter and return demonstration  Exhibits proper breathing techniques, such as pursed lip breathing or other method taught during program session;Maintenance of O2 saturations>88%;Verbalizes importance of monitoring SPO2 with pulse oximeter and return demonstration  Exhibits proper breathing techniques, such as pursed lip breathing or other method taught during program session;Maintenance of O2 saturations>88%;Verbalizes importance of monitoring SPO2 with pulse oximeter and return demonstration    Comments  Reviewed PLB technique with pt.  Talked about how it work and it's important to maintaining his exercise saturations.    Bethena Roys does not wear oxygen at home and does not wear a CPAP. She does not use inhalers or nebulizers. Bethena Roys needs to work on her PLB when she gets short of breath. She has a pulse oximeter at home and checks her saturations. Bethena Roys states she stays around 97-98 percent.  Bethena Roys is using her pursed lip breathing when she remembers and notes that it really does make a difference in controlling her breathing. She continues to use her pulse oximeter to monitor her saturations.  She contiues to manage her disease and has found that her breathing is getting better as she does more exercise.    Judy monitors her oxygen at home and is always in the mid and high 90's. Her oxygen while exercising has been within normal limits.     Goals/Expected Outcomes  Short: Become more profiecient at using PLB.   Stickles: Become independent at using  PLB.  Short: decrease WOB. [place]: be proficient with PLB to decrease  WOB   Short: Contine to use pursed lip breathing more routinely .  Haws: Use pursed lip breathing without having to reminder herself to do it.   Short: increase exercise loads an monitor oxygen. [place]: increase loads without desaturation       Oxygen Discharge (Final Oxygen Re-Evaluation): Oxygen Re-Evaluation - 07/17/17 1427      Program Oxygen Prescription   Program Oxygen Prescription  None      Home Oxygen   Home Oxygen Device  None    Sleep Oxygen Prescription  None    Home Exercise Oxygen Prescription  None    Home at Rest Exercise Oxygen Prescription  None      Goals/Expected Outcomes   Short Term Goals  To learn and understand importance of maintaining oxygen saturations>88%;To learn and demonstrate proper pursed lip breathing techniques or other breathing techniques.;To learn and understand importance of monitoring SPO2 with pulse oximeter and demonstrate accurate use of the pulse oximeter.    Yanes  Term Goals  Exhibits proper breathing techniques, such as pursed lip breathing or other method taught during program session;Maintenance of O2 saturations>88%;Verbalizes importance of monitoring SPO2 with pulse oximeter and return demonstration    Comments  Judy monitors her oxygen at home and is always in the mid and high 90's. Her oxygen while exercising has been within normal limits.     Goals/Expected Outcomes  Short: increase exercise loads an monitor oxygen. [place]: increase loads without desaturation       Initial Exercise Prescription: Initial Exercise Prescription - 04/20/17 1500      Date of Initial Exercise RX and Referring Provider   Date  04/20/17    Referring Provider  [name] MD      Treadmill   MPH  2.1    Grade  0    Minutes  15    METs  2.61      NuStep   Level  2    SPM  80    Minutes  15    METs  2.6      REL-XR   Level  1    Speed  50    Minutes  15    METs  2.6       Prescription Details   Frequency (times per week)  3    Duration  Progress to 45 minutes of aerobic exercise without signs/symptoms of physical distress      Intensity   THRR 40-80% of Max Heartrate  103-132    Ratings of Perceived Exertion  11-13    Perceived Dyspnea  0-4      Progression   Progression  Continue to progress workloads to maintain intensity without signs/symptoms of physical distress.      Resistance Training   Training Prescription  Yes    Weight  3 lbs    Reps  10-15       Perform Capillary Blood Glucose checks as needed.  Exercise Prescription Changes: Exercise Prescription Changes    Row Name 04/20/17 1500 04/29/17 1200 05/01/17 1000 05/13/17 1000 05/27/17 1200     Response to Exercise   Blood Pressure (Admit)  122/70  126/64  -  108/62  126/70   Blood Pressure (Exercise)  126/60  -  -  134/64  -   Blood Pressure (Exit)  122/64  114/56  -  102/60  122/74   Heart Rate (Admit)  74 bpm  77 bpm  -  98 bpm  77 bpm  Heart Rate (Exercise)  106 bpm  100 bpm  -  106 bpm  122 bpm   Heart Rate (Exit)  76 bpm  75 bpm  -  78 bpm  73 bpm   Oxygen Saturation (Admit)  98 %  98 %  -  100 %  98 %   Oxygen Saturation (Exercise)  93 %  99 %  -  100 %  97 %   Oxygen Saturation (Exit)  100 %  99 %  -  100 %  97 %   Rating of Perceived Exertion (Exercise)  11  10  -  13  14   Perceived Dyspnea (Exercise)  1.5  1  -  1  2   Symptoms  none  none  -  none  none   Comments  walk test results  -  -  -  -   Duration  -  Progress to 45 minutes of aerobic exercise without signs/symptoms of physical distress  -  Progress to 45 minutes of aerobic exercise without signs/symptoms of physical distress  Continue with 45 min of aerobic exercise without signs/symptoms of physical distress.   Intensity  -  THRR unchanged  -  THRR unchanged  THRR unchanged     Progression   Progression  -  Continue to progress workloads to maintain intensity without signs/symptoms of physical distress.  -   Continue to progress workloads to maintain intensity without signs/symptoms of physical distress.  Continue to progress workloads to maintain intensity without signs/symptoms of physical distress.   Average METs  -  3.3  -  3.4  2.7     Resistance Training   Training Prescription  -  Yes  -  Yes  Yes   Weight  -  3 lbs  -  3 lbs  3 lb   Reps  -  10-15  -  10-15  10-15     Interval Training   Interval Training  -  No  -  No  -     Treadmill   MPH  -  -  -  -  3   Grade  -  -  -  -  0   Minutes  -  -  -  -  15   METs  -  -  -  -  3.3     NuStep   Level  -  2  -  4  -   SPM  -  91  -  86  -   Minutes  -  15  -  15  -   METs  -  2.7  -  3.2  -     Arm Ergometer   Level  -  -  -  -  1.4   Minutes  -  -  -  -  15   METs  -  -  -  -  2     REL-XR   Level  -  1  -  1  -   Speed  -  50  -  54  -   Minutes  -  15  -  15  -   METs  -  3.9  -  3.6  -     Home Exercise Plan   Plans to continue exercise at  -  -  Home (comment) walking at home.  Home (comment) walking at home.  Home (comment) walking  at home.   Frequency  -  -  Add 2 additional days to program exercise sessions.  Add 2 additional days to program exercise sessions.  Add 2 additional days to program exercise sessions.   Initial Home Exercises Provided  -  -  05/01/17  05/01/17  05/01/17   Row Name 06/10/17 1200 06/26/17 1100 07/08/17 1500 07/23/17 1100       Response to Exercise   Blood Pressure (Admit)  102/62  118/60  126/70  128/70    Blood Pressure (Exit)  126/70  112/60  124/60  124/64    Heart Rate (Admit)  77 bpm  83 bpm  75 bpm  75 bpm    Heart Rate (Exercise)  136 bpm  140 bpm  103 bpm  140 bpm    Heart Rate (Exit)  75 bpm  76 bpm  75 bpm  95 bpm    Oxygen Saturation (Admit)  98 %  98 %  98 %  100 %    Oxygen Saturation (Exercise)  95 %  98 %  97 %  95 %    Oxygen Saturation (Exit)  98 %  96 %  95 %  97 %    Rating of Perceived Exertion (Exercise)  '13  13  13  12    '$ Perceived Dyspnea (Exercise)  '1  1  1  1     '$ Symptoms  none  none  none  none    Duration  Continue with 45 min of aerobic exercise without signs/symptoms of physical distress.  Continue with 45 min of aerobic exercise without signs/symptoms of physical distress.  Continue with 45 min of aerobic exercise without signs/symptoms of physical distress.  Continue with 45 min of aerobic exercise without signs/symptoms of physical distress.    Intensity  THRR unchanged  THRR unchanged  THRR unchanged  THRR unchanged      Progression   Progression  Continue to progress workloads to maintain intensity without signs/symptoms of physical distress.  Continue to progress workloads to maintain intensity without signs/symptoms of physical distress.  Continue to progress workloads to maintain intensity without signs/symptoms of physical distress.  Continue to progress workloads to maintain intensity without signs/symptoms of physical distress.    Average METs  2.86  3.76  2.95  4      Resistance Training   Training Prescription  Yes  -  Yes  Yes    Weight  3 lb  -  4 lb  4 lb    Reps  10-15  -  10-15  10-15      Interval Training   Interval Training  No  -  No  No      Treadmill   MPH  3  3  -  3.3    Grade  0  2  -  3.5    Minutes  15  15  -  15    METs  3.3  4.12  -  5.12      NuStep   Level  '6  6  6  7    '$ SPM  66  68  71  77    Minutes  '15  15  15  15    '$ METs  3.3  3.4  3.5  2.9      Arm Ergometer   Level  1.4  -  3  -    Minutes  15  -  15  -  METs  2.1  -  2.4  -      Home Exercise Plan   Plans to continue exercise at  Home (comment) walking at home.  Home (comment) walking at home.  -  Home (comment) walking at home.    Frequency  Add 2 additional days to program exercise sessions.  Add 2 additional days to program exercise sessions.  -  Add 2 additional days to program exercise sessions.    Initial Home Exercises Provided  05/01/17  05/01/17  -  05/01/17       Exercise Comments: Exercise Comments    Row Name 04/27/17 1028  05/01/17 1101         Exercise Comments  First full day of exercise!  Patient was oriented to gym and equipment including functions, settings, policies, and procedures.  Patient's individual exercise prescription and treatment plan were reviewed.  All starting workloads were established based on the results of the 6 minute walk test done at initial orientation visit.  The plan for exercise progression was also introduced and progression will be customized based on patient's performance and goals.  Reviewed home exercise with pt today.  Pt plans to walk 2 extra days a week at home for exercise.  Reviewed THR, pulse, RPE, sign and symptoms, NTG use, and when to call 911 or MD.  Also discussed weather considerations and indoor options.  Pt voiced understanding         Exercise Goals and Review: Exercise Goals    Row Name 04/20/17 1558             Exercise Goals   Increase Physical Activity  Yes       Intervention  Provide advice, education, support and counseling about physical activity/exercise needs.;Develop an individualized exercise prescription for aerobic and resistive training based on initial evaluation findings, risk stratification, comorbidities and participant's personal goals.       Expected Outcomes  Achievement of increased cardiorespiratory fitness and enhanced flexibility, muscular endurance and strength shown through measurements of functional capacity and personal statement of participant.       Increase Strength and Stamina  Yes       Intervention  Provide advice, education, support and counseling about physical activity/exercise needs.;Develop an individualized exercise prescription for aerobic and resistive training based on initial evaluation findings, risk stratification, comorbidities and participant's personal goals.       Expected Outcomes  Achievement of increased cardiorespiratory fitness and enhanced flexibility, muscular endurance and strength shown through  measurements of functional capacity and personal statement of participant.       Able to understand and use rate of perceived exertion (RPE) scale  Yes       Intervention  Provide education and explanation on how to use RPE scale       Expected Outcomes  Short Term: Able to use RPE daily in rehab to express subjective intensity level;[place] Term:  Able to use RPE to guide intensity level when exercising independently       Able to understand and use Dyspnea scale  Yes       Intervention  Provide education and explanation on how to use Dyspnea scale       Expected Outcomes  Short Term: Able to use Dyspnea scale daily in rehab to express subjective sense of shortness of breath during exertion;[place] Term: Able to use Dyspnea scale to guide intensity level when exercising independently       Knowledge and understanding of Target Heart  Rate Range (THRR)  Yes       Intervention  Provide education and explanation of THRR including how the numbers were predicted and where they are located for reference       Expected Outcomes  Short Term: Able to state/look up THRR;[place] Term: Able to use THRR to govern intensity when exercising independently;Short Term: Able to use daily as guideline for intensity in rehab       Able to check pulse independently  Yes       Intervention  Provide education and demonstration on how to check pulse in carotid and radial arteries.;Review the importance of being able to check your own pulse for safety during independent exercise       Expected Outcomes  Short Term: Able to explain why pulse checking is important during independent exercise;[place]bert Term: Able to check pulse independently and accurately       Understanding of Exercise Prescription  Yes       Intervention  Provide education, explanation, and written materials on patient's individual exercise prescription       Expected Outcomes  Short Term: Able to explain program exercise prescription;[place] Term: Able to explain home  exercise prescription to exercise independently          Exercise Goals Re-Evaluation : Exercise Goals Re-Evaluation    Row Name 04/27/17 1028 04/29/17 1245 05/01/17 1056 05/13/17 1049 05/27/17 1225     Exercise Goal Re-Evaluation   Exercise Goals Review  Understanding of Exercise Prescription;Able to understand and use Dyspnea scale;Knowledge and understanding of Target Heart Rate Range (THRR);Able to understand and use rate of perceived exertion (RPE) scale  Increase Physical Activity;Increase Strength and Stamina;Able to understand and use Dyspnea scale;Able to understand and use rate of perceived exertion (RPE) scale  Increase Physical Activity;Increase Strength and Stamina  Increase Physical Activity;Increase Strength and Stamina  Increase Physical Activity;Increase Strength and Stamina   Comments  Reviewed RPE scale, THR and program prescription with pt today.  Pt voiced understanding and was given a copy of goals to take home.   Bethena Roys has tolerated exrecise well in her first two sessions.  Staff will continue to monitor for progress.  Reviewed home exercise with pt today.  Pt plans to walk 2 extra days a week at home for exercise.  Reviewed THR, pulse, RPE, sign and symptoms, NTG use, and when to call 911 or MD.  Also discussed weather considerations and indoor options.  Pt voiced understanding  Bethena Roys is tolerating exercise well.  She has missed some sessions due to taking care of her husband.  She plans to return after Thanksgiving.  Bethena Roys has increased her walk speed.  Staff will monitor progress.   Expected Outcomes  Short: Use RPE daily to regulate intensity.  Schommer: Follow program prescription in THR.  Short - Bethena Roys will attend class regularly.  Hulbert - Bethena Roys will increase overall MET level.  Short: add 2 extra days of walking at home. Chittick: add extra days of exercising.  Short - Bethena Roys will return and attend regularly.  [place] - Bethena Roys will increase overall fitness level.  Short - Bethena Roys will build on  current levels.  Luck - Bethena Roys will maintain fitness level on her own.   [place] Name 06/10/17 1222 06/15/17 1102 06/26/17 1145 07/08/17 1542 07/23/17 1120     Exercise Goal Re-Evaluation   Exercise Goals Review  Increase Physical Activity;Increase Strength and Stamina;Able to understand and use rate of perceived exertion (RPE) scale  Increase Physical Activity;Increase Strength  and Stamina;Able to understand and use rate of perceived exertion (RPE) scale  Increase Physical Activity;Increase Strength and Stamina;Able to understand and use rate of perceived exertion (RPE) scale;Able to understand and use Dyspnea scale;Understanding of Exercise Prescription  Increase Physical Activity;Increase Strength and Stamina;Able to understand and use rate of perceived exertion (RPE) scale;Able to understand and use Dyspnea scale  Increase Physical Activity;Able to understand and use rate of perceived exertion (RPE) scale;Increase Strength and Stamina;Able to understand and use Dyspnea scale   Comments  Bethena Roys is progressing well with exercise and has improved overall MET level.  Staff will continue to monitor progress.  Bethena Roys has been doing well in rehab.  She is starting feel stronger and stamina is beginning to come back.  She is doing some home exercise.  She is walking for 30 min for about 2 days a week.    Bethena Roys has increased overall MET level and increased weight strength training to 4 lb.  Bethena Roys continues to progress well with exercise.  She has increased weight strength training to 4 lb  Bethena Roys has increased overall MET level to 4!   Expected Outcomes  Short - Bethena Roys will continue to attend LW regularly She[place]en - Bethena Roys will improve overall MET level  Short: Maintain her home exercise routine.  Stalvey: Continue to work on improving her strength and stamina.   Short - Bethena Roys will continue to improve Cordner - Bethena Roys will maintain fitness on her own  Short - Bethena Roys will continue to attend regularly [place] - Bethena Roys will maintain exercise on her own   Short - Bethena Roys will continue to attend three days per week [place] - Bethena Roys will maintain exercise on her own   [place] Name 07/27/17 1122             Exercise Goal Re-Evaluation   Exercise Goals Review  Increase Physical Activity;Increase Strength and Stamina       Comments  6 min walk done today. Results reviewed with patient.           Discharge Exercise Prescription (Final Exercise Prescription Changes): Exercise Prescription Changes - 07/23/17 1100      Response to Exercise   Blood Pressure (Admit)  128/70    Blood Pressure (Exit)  124/64    Heart Rate (Admit)  75 bpm    Heart Rate (Exercise)  140 bpm    Heart Rate (Exit)  95 bpm    Oxygen Saturation (Admit)  100 %    Oxygen Saturation (Exercise)  95 %    Oxygen Saturation (Exit)  97 %    Rating of Perceived Exertion (Exercise)  12    Perceived Dyspnea (Exercise)  1    Symptoms  none    Duration  Continue with 45 min of aerobic exercise without signs/symptoms of physical distress.    Intensity  THRR unchanged      Progression   Progression  Continue to progress workloads to maintain intensity without signs/symptoms of physical distress.    Average METs  4      Resistance Training   Training Prescription  Yes    Weight  4 lb    Reps  10-15      Interval Training   Interval Training  No      Treadmill   MPH  3.3    Grade  3.5    Minutes  15    METs  5.12      NuStep   Level  7    SPM  77    Minutes  15    METs  2.9      Home Exercise Plan   Plans to continue exercise at  Home (comment) walking at home.    Frequency  Add 2 additional days to program exercise sessions.    Initial Home Exercises Provided  05/01/17       Nutrition:  Target Goals: Understanding of nutrition guidelines, daily intake of sodium '1500mg'$ , cholesterol '200mg'$ , calories 30% from fat and 7% or less from saturated fats, daily to have 5 or more servings of fruits and vegetables.  Biometrics: Pre Biometrics - 04/20/17 1558      Pre  Biometrics   Height  5' 3.5" (1.613 m)    Weight  153 lb 11.2 oz (69.7 kg)    Waist Circumference  34 inches    Hip Circumference  39.75 inches    Waist to Hip Ratio  0.86 %    BMI (Calculated)  26.8      Post Biometrics - 07/27/17 1121       Post  Biometrics   Height  5' 3.5" (1.613 m)    Weight  149 lb 14.4 oz (68 kg)    Waist Circumference  34 inches    Hip Circumference  39.5 inches    Waist to Hip Ratio  0.86 %    BMI (Calculated)  26.13       Nutrition Therapy Plan and Nutrition Goals: Nutrition Therapy & Goals - 07/13/17 1032      Nutrition Therapy   Diet  TLC    Drug/Food Interactions  Coumadin/Vit K    Protein (specify units)  50g    Fiber  25 grams    Saturated Fats  10 max. grams    Fruits and Vegetables  6 servings/day    Sodium  1500 grams      Personal Nutrition Goals   Nutrition Goal  Practice reading the nutrition facts label and ingredients list more often; identify foods high/low in whole grains, sugar, sodium    Personal Goal #2  Consume more whole food-based snack options, such as fruit as mentioned    Personal Goal #3  Monitor portion sizes, use [employer] program as a tool to help you learn proper portions    Comments  She has joined [employer] 2 weeks ago to help with slight weight reduction      Intervention Plan   Intervention  Prescribe, educate and counsel regarding individualized specific dietary modifications aiming towards targeted core components such as weight, hypertension, lipid management, diabetes, heart failure and other comorbidities.;Nutrition handout(s) given to patient. Coumadin/Warfarin dietary interactions handout provided    Expected Outcomes  Short Term Goal: Understand basic principles of dietary content, such as calories, fat, sodium, cholesterol and nutrients.;Short Term Goal: A plan has been developed with personal nutrition goals set during dietitian appointment.;Fariss Term Goal: Adherence to prescribed nutrition plan.        Nutrition Assessments: Nutrition Assessments - 07/29/17 1422      MEDFICTS Scores   Pre Score  20       Nutrition Goals Re-Evaluation: Nutrition Goals Re-Evaluation    Row Name 05/06/17 1109 05/20/17 1150 06/15/17 1108 07/17/17 1431       Goals   Current Weight  151 lb 1.6 oz (68.5 kg)  -  154 lb 3.2 oz (69.9 kg)  148 lb 14.4 oz (67.5 kg)    Nutrition Goal  Eat healthier and lose weight.  -  Eat  healthier and lose weight.    Lose weight and eat a healtier diet    Comment  Bethena Roys has made an appointment to see the dietician.  Scheduled to meet with RD 12/10  Her appointment was rescheduled due to the weather to meet with Lattie Haw after class.  She is looking forward to talk to her for some tips to help lose the weight again.   Bethena Roys states she has been eating more fruit and vegetables. She has lost a few pounds since the start of the program. She is currently working on her portion control. She would like to lose about 10 more pounds.    Expected Outcome  Short: meet with dietician. Prosch: Adhere to a diet plan.  Short - meet with RD  [place] - follow recommendations from RD  Short: Meet with RD.  Ressler: Continue to eat healthier.  Short: lose weight by eating smaller portions. Pellum: maintain weightloss.       Nutrition Goals Discharge (Final Nutrition Goals Re-Evaluation): Nutrition Goals Re-Evaluation - 07/17/17 1431      Goals   Current Weight  148 lb 14.4 oz (67.5 kg)    Nutrition Goal  Lose weight and eat a healtier diet    Comment  Bethena Roys states she has been eating more fruit and vegetables. She has lost a few pounds since the start of the program. She is currently working on her portion control. She would like to lose about 10 more pounds.    Expected Outcome  Short: lose weight by eating smaller portions. Niu: maintain weightloss.       Psychosocial: Target Goals: Acknowledge presence or absence of significant depression and/or stress, maximize coping skills, provide positive support  system. Participant is able to verbalize types and ability to use techniques and skills needed for reducing stress and depression.   Initial Review & Psychosocial Screening: Initial Psych Review & Screening - 04/20/17 1446      Initial Review   Current issues with  Current Sleep Concerns;Current Stress Concerns    Source of Stress Concerns  Family    Comments  Husband has cancer, he was diagnosed five years ago and is on IV chemo.       Family Dynamics   Good Support System?  Yes    Comments  Patient has one daughter and she lives in [place]. She has one grandson and two step grandkids.      Barriers   Psychosocial barriers to participate in program  The patient should benefit from training in stress management and relaxation.      Screening Interventions   Interventions  Yes;Encouraged to exercise;Program counselor consult;Provide feedback about the scores to participant;To provide support and resources with identified psychosocial needs    Expected Outcomes  Short Term goal: Utilizing psychosocial counselor, staff and physician to assist with identification of specific Stressors or current issues interfering with healing process. Setting desired goal for each stressor or current issue identified.;Alessandrini Term Goal: Stressors or current issues are controlled or eliminated.;Short Term goal: Identification and review with participant of any Quality of Life or Depression concerns found by scoring the questionnaire.;Penaflor Term goal: The participant improves quality of Life and PHQ9 Scores as seen by post scores and/or verbalization of changes       Quality of Life Scores:  Scores of 19 and below usually indicate a poorer quality of life in these areas.  A difference of  2-3 points is a clinically meaningful difference.  A difference of 2-3  points in the total score of the Quality of Life Index has been associated with significant improvement in overall quality of life, self-image, physical  symptoms, and general health in studies assessing change in quality of life.  PHQ-9: Recent Review Flowsheet Data    Depression screen [facility] 2/9 07/29/2017 07/20/2017 04/20/2017 07/07/2016 07/02/2015   Decreased Interest 0 0 0 0 0   Down, Depressed, Hopeless 0 0 0 0 0   PHQ - 2 Score 0 0 0 0 0   Altered sleeping 0 0 1 - -   Tired, decreased energy 0 0 1 - -   Change in appetite 0 0 1 - -   Feeling bad or failure about yourself  0 0 0 - -   Trouble concentrating 0 0 0 - -   Moving slowly or fidgety/restless 0 0 0 - -   Suicidal thoughts 0 0 0 - -   PHQ-9 Score 0 0 3 - -   Difficult doing work/chores - Not difficult at all Not difficult at all - -     Interpretation of Total Score  Total Score Depression Severity:  1-4 = Minimal depression, 5-9 = Mild depression, 10-14 = Moderate depression, 15-19 = Moderately severe depression, 20-27 = Severe depression   Psychosocial Evaluation and Intervention: Psychosocial Evaluation - 08/03/17 1132      Discharge Psychosocial Assessment & Intervention   Comments  Counselor met with Bethena Roys for a discharge evaluation today.  She will complete this program on Wednesday.  She reports progress of breathing better and feeling stronger overall since coming into this program.  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  She states her stress has reduced since her spouse is doing better on his chemo regimen of treatment.  He is less able to help around the house now, but her energy level is up so it is not as stressful for her to "fill in the gap" for him.  Bethena Roys plans to continue exercising at her community gym program and to walk on good weather days outside.  She reported enjoying this program and the educational parts as well.  Counselor commended Bethena Roys on her progress made and goals accomplished, as well as her positive attitude and coping strategies with stress.           Psychosocial Re-Evaluation: Psychosocial Re-Evaluation    [place] Name 05/20/17 1058 05/20/17 1153 06/15/17 1110 07/17/17 1434       Psychosocial Re-Evaluation   Current issues with  Current Stress Concerns  Current Stress Concerns  Current Stress Concerns  Current Stress Concerns    Comments  Counselor follow up with Bethena Roys reporting already seeing some progress since coming into this program - with less shortness of breath.  She has been out several weeks due to her husband being in the hospital and that has been stressful, but she reports he is somewhat better and she is relieved.  Counselor commended Bethena Roys on her progress made and commitment to exercise.    Judy's current stress involves helping her spouse who has multiple myeloma.  He has recently had unexplained fever and has been referred to an Infectious disease speciallist.  They both work part time and she enjoys her work and it is not stressful for her.  Judy's stress levels have leveled off.  Her husband continues to be her primary source of  stress, but his health starting to calm back down. She continue to work and enjoy her jobs.  The best part of coming to rehab has been that she is feeling like she is making progress with her strength and shortness of breath.  Judy's husband has been having chemotherapy and is now monthly instead of weekly. Her stress levels have gone down a little bit since her husband does not have as many appointments to get to. She states her sleep has been good. She has a positive outlook and is willing to work hard.    Expected Outcomes  Bethena Roys will continue to exercise consistently for her own health and for managing stress in her life.    Short - Bethena Roys will continue to practice self care so she can better help her spouse.  [place] - Bethena Roys will manage stress through exercising and self care.  Short: Continue to focus on her self to improve strength and shortness of breath.  Veazie: Continue to manage stress in a healthy  way.   Short: attend [name] to minimize stress. [place]: maintain an exercise routine to keep stress at a minimum.    Interventions  Stress management education  Encouraged to attend Pulmonary Rehabilitation for the exercise  Encouraged to attend Pulmonary Rehabilitation for the exercise;Stress management education  Encouraged to attend Pulmonary Rehabilitation for the exercise    Continue Psychosocial Services   Follow up required by staff  Follow up required by staff  Follow up required by staff  Follow up required by staff    Comments  -  -  Husband has cancer, he was diagnosed five years ago and is on IV chemo.   -      Initial Review   Source of Stress Concerns  -  -  Family  -       Psychosocial Discharge (Final Psychosocial Re-Evaluation): Psychosocial Re-Evaluation - 07/17/17 1434      Psychosocial Re-Evaluation   Current issues with  Current Stress Concerns    Comments  Judy's husband has been having chemotherapy and is now monthly instead of weekly. Her stress levels have gone down a little bit since her husband does not have as many appointments to get to. She states her sleep has been good. She has a positive outlook and is willing to work hard.    Expected Outcomes  Short: attend [name] to minimize stress. [place]beek: maintain an exercise routine to keep stress at a minimum.    Interventions  Encouraged to attend Pulmonary Rehabilitation for the exercise    Continue Psychosocial Services   Follow up required by staff       Education: Education Goals: Education classes will be provided on a weekly basis, covering required topics. Participant will state understanding/return demonstration of topics presented.  Learning Barriers/Preferences: Learning Barriers/Preferences - 04/20/17 1459      Learning Barriers/Preferences   Learning Barriers  Sight wears glasses    Learning Preferences  None       Education Topics:  Initial Evaluation Education: - Verbal, written and  demonstration of respiratory meds, oximetry and breathing techniques. Instruction on use of nebulizers and MDIs and importance of monitoring MDI activations.   Pulmonary Rehab from 08/05/2017 in [facility] Cardiac and Pulmonary Rehab  Date  04/20/17  Educator  [facility]  Instruction Review Code  1- Verbalizes Understanding      General Nutrition Guidelines/Fats and Fiber: -Group instruction provided by verbal, written material, models and posters to present the general guidelines for heart  healthy nutrition. Gives an explanation and review of dietary fats and fiber.   Pulmonary Rehab from 08/05/2017 in [facility] Cardiac and Pulmonary Rehab  Date  07/20/17  Educator  CR  Instruction Review Code  1- Verbalizes Understanding      Controlling Sodium/Reading Food Labels: -Group verbal and written material supporting the discussion of sodium use in heart healthy nutrition. Review and explanation with models, verbal and written materials for utilization of the food label.   Pulmonary Rehab from 08/05/2017 in [facility] Cardiac and Pulmonary Rehab  Date  07/27/17  Educator  PI  Instruction Review Code  1- Verbalizes Understanding      Exercise Physiology & General Exercise Guidelines: - Group verbal and written instruction with models to review the exercise physiology of the cardiovascular system and associated critical values. Provides general exercise guidelines with specific guidelines to those with heart or lung disease.    Pulmonary Rehab from 08/05/2017 in [facility] Cardiac and Pulmonary Rehab  Date  06/26/17  Educator  [facility]  Instruction Review Code  1- Verbalizes Understanding      Aerobic Exercise & Resistance Training: - Gives group verbal and written instruction on the various components of exercise. Focuses on aerobic and resistive training programs and the benefits of this training and how to safely progress through these programs.   Pulmonary Rehab from 08/05/2017 in [facility] Cardiac and Pulmonary Rehab  Date   07/10/17  Educator  AS  Instruction Review Code  1- Verbalizes Understanding      Flexibility, Balance, Mind/Body Relaxation: Provides group verbal/written instruction on the benefits of flexibility and balance training, including mind/body exercise modes such as yoga, pilates and tai chi.  Demonstration and skill practice provided.   Pulmonary Rehab from 08/05/2017 in [facility] Cardiac and Pulmonary Rehab  Date  08/05/17  Educator  AS  Instruction Review Code  1- Verbalizes Understanding      Stress and Anxiety: - Provides group verbal and written instruction about the health risks of elevated stress and causes of high stress.  Discuss the correlation between heart/lung disease and anxiety and treatment options. Review healthy ways to manage with stress and anxiety.   Depression: - Provides group verbal and written instruction on the correlation between heart/lung disease and depressed mood, treatment options, and the stigmas associated with seeking treatment.   Pulmonary Rehab from 08/05/2017 in [facility] Cardiac and Pulmonary Rehab  Date  05/06/17  Educator  [facility]  Instruction Review Code  1- Verbalizes Understanding      Exercise & Equipment Safety: - Individual verbal instruction and demonstration of equipment use and safety with use of the equipment.   Pulmonary Rehab from 08/05/2017 in [facility] Cardiac and Pulmonary Rehab  Date  04/20/17  Educator  [facility]  Instruction Review Code  1- Verbalizes Understanding      Infection Prevention: - Provides verbal and written material to individual with discussion of infection control including proper hand washing and proper equipment cleaning during exercise session.   Pulmonary Rehab from 08/05/2017 in [facility] Cardiac and Pulmonary Rehab  Date  04/20/17  Educator  [facility]  Instruction Review Code  1- Verbalizes Understanding      Falls Prevention: - Provides verbal and written material to individual with discussion of falls prevention and safety.    Pulmonary Rehab from 08/05/2017 in [facility] Cardiac and Pulmonary Rehab  Date  04/20/17  Educator  [facility]  Instruction Review Code  1- Verbalizes Understanding      Diabetes: - Individual verbal and written instruction  to review signs/symptoms of diabetes, desired ranges of glucose level fasting, after meals and with exercise. Advice that pre and post exercise glucose checks will be done for 3 sessions at entry of program.   Chronic Lung Diseases: - Group verbal and written instruction to review updates, respiratory medications, advancements in procedures and treatments. Discuss use of supplemental oxygen including available portable oxygen systems, continuous and intermittent flow rates, concentrators, personal use and safety guidelines. Review proper use of inhaler and spacers. Provide informative websites for self-education.    Energy Conservation: - Provide group verbal and written instruction for methods to conserve energy, plan and organize activities. Instruct on pacing techniques, use of adaptive equipment and posture/positioning to relieve shortness of breath.   Pulmonary Rehab from 08/05/2017 in [facility] Cardiac and Pulmonary Rehab  Date  05/20/17  Educator  [facility]  Instruction Review Code  1- Verbalizes Understanding      Triggers and Exacerbations: - Group verbal and written instruction to review types of environmental triggers and ways to prevent exacerbations. Discuss weather changes, air quality and the benefits of nasal washing. Review warning signs and symptoms to help prevent infections. Discuss techniques for effective airway clearance, coughing, and vibrations.   Pulmonary Rehab from 08/05/2017 in [facility] Cardiac and Pulmonary Rehab  Date  07/08/17  Educator  [facility]  Instruction Review Code  1- Verbalizes Understanding      AED/CPR: - Group verbal and written instruction with the use of models to demonstrate the basic use of the AED with the basic ABC's of resuscitation.   Pulmonary  Rehab from 08/05/2017 in [facility] Cardiac and Pulmonary Rehab  Date  06/12/17  Educator  [facility]  Instruction Review Code  1- Actuary and Physiology of the Lungs: - Group verbal and written instruction with the use of models to provide basic lung anatomy and physiology related to function, structure and complications of lung disease.   Pulmonary Rehab from 08/05/2017 in [facility] Cardiac and Pulmonary Rehab  Date  05/27/17  Educator  [facility]  Instruction Review Code  1- Verbalizes Understanding      Anatomy & Physiology of the Heart: - Group verbal and written instruction and models provide basic cardiac anatomy and physiology, with the coronary electrical and arterial systems. Review of Valvular disease and Heart Failure   Pulmonary Rehab from 08/05/2017 in [facility] Cardiac and Pulmonary Rehab  Date  07/22/17  Educator  [facility]  Instruction Review Code  1- Verbalizes Understanding      Cardiac Medications: - Group verbal and written instruction to review commonly prescribed medications for heart disease. Reviews the medication, class of the drug, and side effects.   Know Your Numbers and Risk Factors: -Group verbal and written instruction about important numbers in your health.  Discussion of what are risk factors and how they play a role in the disease process.  Review of Cholesterol, Blood Pressure, Diabetes, and BMI and the role they play in your overall health.   Pulmonary Rehab from 08/05/2017 in [facility] Cardiac and Pulmonary Rehab  Date  07/01/17  Educator  [facility]  Instruction Review Code  1- Verbalizes Understanding      Sleep Hygiene: -Provides group verbal and written instruction about how sleep can affect your health.  Define sleep hygiene, discuss sleep cycles and impact of sleep habits. Review good sleep hygiene tips.    Other: -Provides group and verbal instruction on various topics (see comments)   Pulmonary Rehab from 08/05/2017 in [facility] Cardiac  and Pulmonary Rehab   Date  07/15/17  Educator  [facility]  Instruction Review Code  1- Verbalizes Understanding [SLEEP]       Knowledge Questionnaire Score: Knowledge Questionnaire Score - 07/29/17 1419      Knowledge Questionnaire Score   Pre Score  9/10    Post Score  9/10 reviewed with patient        Core Components/Risk Factors/Patient Goals at Admission: Personal Goals and Risk Factors at Admission - 04/20/17 1505      Core Components/Risk Factors/Patient Goals on Admission    Weight Management  Yes;Weight Loss    Intervention  Weight Management: Develop a combined nutrition and exercise program designed to reach desired caloric intake, while maintaining appropriate intake of nutrient and fiber, sodium and fats, and appropriate energy expenditure required for the weight goal.;Weight Management: Provide education and appropriate resources to help participant work on and attain dietary goals.;Weight Management/Obesity: Establish reasonable short term and [place] term weight goals.    Admit Weight  153 lb 11.2 oz (69.7 kg)    Goal Weight: Short Term  148 lb (67.1 kg)    Goal Weight: [place] Term  135 lb (61.2 kg)    Expected Outcomes  [place] Term: Adherence to nutrition and physical activity/exercise program aimed toward attainment of established weight goal;Weight Maintenance: Understanding of the daily nutrition guidelines, which includes 25-35% calories from fat, 7% or less cal from saturated fats, less than '200mg'$  cholesterol, less than 1.5gm of sodium, & 5 or more servings of fruits and vegetables daily;Short Term: Continue to assess and modify interventions until short term weight is achieved;Weight Loss: Understanding of general recommendations for a balanced deficit meal plan, which promotes 1-2 lb weight loss per week and includes a negative energy balance of [phone] kcal/d;Understanding recommendations for meals to include 15-35% energy as protein, 25-35% energy from fat, 35-60% energy from carbohydrates, less  than '200mg'$  of dietary cholesterol, 20-35 gm of total fiber daily;Understanding of distribution of calorie intake throughout the day with the consumption of 4-5 meals/snacks    Improve shortness of breath with ADL's  Yes    Intervention  Provide education, individualized exercise plan and daily activity instruction to help decrease symptoms of SOB with activities of daily living.    Expected Outcomes  Short Term: Achieves a reduction of symptoms when performing activities of daily living.    Heart Failure  Yes    Intervention  Provide a combined exercise and nutrition program that is supplemented with education, support and counseling about heart failure. Directed toward relieving symptoms such as shortness of breath, decreased exercise tolerance, and extremity edema.    Expected Outcomes  Improve functional capacity of life;Short term: Attendance in program 2-3 days a week with increased exercise capacity. Reported lower sodium intake. Reported increased fruit and vegetable intake. Reports medication compliance.;Short term: Daily weights obtained and reported for increase. Utilizing diuretic protocols set by physician.;[place] term: Adoption of self-care skills and reduction of barriers for early signs and symptoms recognition and intervention leading to self-care maintenance.    Hypertension  Yes    Intervention  Provide education on lifestyle modifcations including regular physical activity/exercise, weight management, moderate sodium restriction and increased consumption of fresh fruit, vegetables, and low fat dairy, alcohol moderation, and smoking cessation.;Monitor prescription use compliance.    Expected Outcomes  [place]urch Term: Maintenance of blood pressure at goal levels.;Short Term: Continued assessment and intervention until BP is < 140/52m HG in hypertensive participants. < 130/837mHG in hypertensive participants with  diabetes, heart failure or chronic kidney disease.    Stress  Yes    Intervention   Offer individual and/or small group education and counseling on adjustment to heart disease, stress management and health-related lifestyle change. Teach and support self-help strategies.;Refer participants experiencing significant psychosocial distress to appropriate mental health specialists for further evaluation and treatment. When possible, include family members and significant others in education/counseling sessions.    Expected Outcomes  Short Term: Participant demonstrates changes in health-related behavior, relaxation and other stress management skills, ability to obtain effective social support, and compliance with psychotropic medications if prescribed.;Belle Term: Emotional wellbeing is indicated by absence of clinically significant psychosocial distress or social isolation.       Core Components/Risk Factors/Patient Goals Review:  Goals and Risk Factor Review    Row Name 05/20/17 1150 06/15/17 1104 07/17/17 1422         Core Components/Risk Factors/Patient Goals Review   Personal Goals Review  Weight Management/Obesity;Hypertension;Stress;Improve shortness of breath with ADL's  Weight Management/Obesity;Hypertension;Improve shortness of breath with ADL's  Weight Management/Obesity;Hypertension;Improve shortness of breath with ADL's     Review  Bethena Roys is scheduled to meet with RD 12/10.  She states she is not as short of breath when walking and carrying.  She is not getting a lot of exrecise at home.  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  Her stress is from his illness and ongoing complications.  Bethena Roys has been doing well in rehab.  She has gained a little but plans to take an extra fluid pill today.  Her blood pressures have been doing good but she does not check them at home.  She plans to get the machine out and start checking more frequently for her and her husband.  She is doing better with her SOB resting.  She finds that it still happens with heavier exertion, but has  gotten a lot better since starting program.    Bethena Roys has been checking her blood pressure at home and it has been reading around 100/70 for her resting blood pressure. She still would like to lose some weight. Her breathing has improved at home while doing her daily activities.     Expected Outcomes  Short - Bethena Roys will meet with RD and staff will review home exercise with her.  [place] - Bethena Roys will begin more healthy eating and add exercise at home.  Short: Get out blood pressure cuff and start checking at home.  [place]: Continue to work on weight loss and risk factors.   Short: attend [name] to lose weight. [place]: maintain weight after weight loss.        Core Components/Risk Factors/Patient Goals at Discharge (Final Review):  Goals and Risk Factor Review - 07/17/17 1422      Core Components/Risk Factors/Patient Goals Review   Personal Goals Review  Weight Management/Obesity;Hypertension;Improve shortness of breath with ADL's    Review  Bethena Roys has been checking her blood pressure at home and it has been reading around 100/70 for her resting blood pressure. She still would like to lose some weight. Her breathing has improved at home while doing her daily activities.    Expected Outcomes  Short: attend [name] to lose weight. [place]: maintain weight after weight loss.       ITP Comments: ITP Comments    Row Name 04/20/17 1427 04/27/17 1121 05/07/17 1447 05/11/17 0825 06/08/17 0816   ITP Comments  Medical Evaluation completed. Chart sent for review and changes to  Dr. Emily Filbert Director of [place]. Visit Diagnosis can be found in [facility] encounter 04/20/17  Spent 10 minutes with patient reviewing PLB.  Bethena Roys stopped by to say she will be back after Thanksgiving due to her husbands health.  She will return Monday 11/26.  30 day review completed. ITP sent to Dr. Emily Filbert Director of [place]. Continue with ITP unless changes are made by physician.    30 day review completed. ITP sent to Dr. Emily Filbert  Director of [place]. Continue with ITP unless changes are made by physician.     [place] Name 07/06/17 0817 08/03/17 0910         ITP Comments  30 day review completed. ITP sent to Dr. Emily Filbert Director of [place]. Continue with ITP unless changes are made by physician.  30 day review completed. ITP sent to Dr. Emily Filbert Director of [place]. Continue with ITP unless changes are made by physician.         Comments: Discharge ITP

## 2017-08-05 NOTE — Progress Notes (Signed)
Daily Session Note  Patient Details  Name: Elizabeth Hall MRN: [id number] Date of Birth: 04/20/1941 Referring Provider:     Pulmonary Rehab from 04/20/2017 in [facility] Cardiac and Pulmonary Rehab  Referring Provider  Simonds, David MD      Encounter Date: 08/05/2017  Check In: Session Check In - 08/05/17 0946      Check-In   Location  [facility]-Cardiac & Pulmonary Rehab    Staff Present  Joseph Hood RCP,RRT,BSRT;Jessica Hawkins, MA, RCEP, CCRP, Exercise Physiologist;Amanda Sommer, BA, ACSM CEP, Exercise Physiologist    Supervising physician immediately available to respond to emergencies  [name] immediately available ER MD    Physician(s)  Dr. Veronese and McShane    Medication changes reported      No    Fall or balance concerns reported     No    Warm-up and Cool-down  Performed as group-led instruction    Resistance Training Performed  Yes    VAD Patient?  No      Pain Assessment   Currently in Pain?  No/denies          Social History   Tobacco Use  Smoking Status Former Smoker  . Packs/day: 0.50  . Years: 20.00  . Pack years: 10.00  . Types: Cigarettes  . Last attempt to quit: 02/04/1990  . Years since quitting: 27.5  Smokeless Tobacco Never Used    Goals Met:  Independence with exercise equipment Exercise tolerated well No report of cardiac concerns or symptoms Strength training completed today  Goals Unmet:  Not Applicable  Comments:  Elizabeth Hall graduated today from pulmonary rehab with 36 sessions completed.  Details of the patient's exercise prescription and what She needs to do in order to continue the prescription and progress were discussed with patient.  Patient was given a copy of prescription and goals.  Patient verbalized understanding.  Elizabeth Hall plans to continue to exercise by exercising at home. Pt able to follow exercise prescription today without complaint.  Will continue to monitor for progression.   Dr. Mark Miller is Medical Director for  [facility] and [name]. 

## 2017-08-11 ENCOUNTER — Telehealth: Payer: Self-pay | Admitting: Family Medicine

## 2017-08-11 ENCOUNTER — Ambulatory Visit (INDEPENDENT_AMBULATORY_CARE_PROVIDER_SITE_OTHER): Payer: Medicare Other | Admitting: *Deleted

## 2017-08-11 DIAGNOSIS — I5022 Chronic systolic (congestive) heart failure: Secondary | ICD-10-CM

## 2017-08-11 DIAGNOSIS — I428 Other cardiomyopathies: Secondary | ICD-10-CM | POA: Diagnosis not present

## 2017-08-11 MED ORDER — OSELTAMIVIR PHOSPHATE 75 MG PO CAPS: 75.0000 mg | ORAL_CAPSULE | Freq: Every day | ORAL | 0 refills | Status: DC

## 2017-08-11 NOTE — Progress Notes (Signed)
Remote ICD transmission.   

## 2017-08-11 NOTE — Telephone Encounter (Signed)
Copied from [place] [phone]. Topic: Quick Communication - See Telephone Encounter >> Aug 11, 2017  8:56 AM Aurelio Brash B wrote: CRM for notification. See Telephone encounter for:  Husband was dx with flu and its been suggested the pt take tamiflu she is requesting that be sent to:    [id number] [address], [place] (Phone)  [phone] (Fax)     08/11/17.

## 2017-08-11 NOTE — Telephone Encounter (Signed)
I spoke with pt and pt does not have flu symptoms yet and wants the tamiflu as preventative measure. [facility].

## 2017-08-11 NOTE — Telephone Encounter (Signed)
Spoke with pt notifying her Tamiflu was sent to pharmacy.

## 2017-08-11 NOTE — Telephone Encounter (Signed)
I've set this in. Called to notify patient.

## 2017-08-11 NOTE — Telephone Encounter (Signed)
Patient is calling back in regards to getting this called in. Please advise.

## 2017-08-12 ENCOUNTER — Telehealth: Payer: Self-pay | Admitting: Cardiovascular Disease

## 2017-08-12 LAB — CUP PACEART REMOTE DEVICE CHECK
Battery Remaining Percentage: 66 %
Date Time Interrogation Session: 20190220063225
HighPow Impedance: 52 Ohm
Implantable Lead Implant Date: 20150316
Implantable Lead Location: 753858
Implantable Lead Model: 346
Implantable Lead Serial Number: 25130583
Implantable Pulse Generator Implant Date: 20150316
Lead Channel Impedance Value: 437 Ohm
Lead Channel Pacing Threshold Amplitude: 0.7 V
Lead Channel Pacing Threshold Pulse Width: 0.4 ms
Lead Channel Setting Pacing Amplitude: 2.5 V
Lead Channel Setting Pacing Pulse Width: 0.4 ms
Lead Channel Setting Pacing Pulse Width: 0.4 ms
Lead Channel Setting Sensing Sensitivity: 0.8 mV
Lead Channel Setting Sensing Sensitivity: 1.6 mV
MDC IDC LEAD IMPLANT DT: 20150316
MDC IDC LEAD LOCATION: 753860
MDC IDC MSMT BATTERY VOLTAGE: 2.97 V
MDC IDC MSMT LEADCHNL LV IMPEDANCE VALUE: 511 Ohm
MDC IDC PG SERIAL: 60765179
MDC IDC SET LEADCHNL RV PACING AMPLITUDE: 2.5 V
MDC IDC STAT BRADY RV PERCENT PACED: 98 %

## 2017-08-12 NOTE — Telephone Encounter (Signed)
Returned call to pt, pt states her husband is in the hospital with the flu.  She has been rx Tamiflu, advised pt no interaction with Coumadin and Tamiflu.  Advised pt if she does get sick and is nauseated, poor appetite, or diarrhea for 3 or more days to call our office for sooner appt as these symptoms can cause INR to become elevated.  Pt verbalized understanding.

## 2017-08-12 NOTE — Telephone Encounter (Signed)
Pt calling stating she started to take Tamiflu   Would like to know since she is on warfarin   Please call back

## 2017-08-13 ENCOUNTER — Encounter: Payer: Self-pay | Admitting: Cardiology

## 2017-08-17 ENCOUNTER — Ambulatory Visit (INDEPENDENT_AMBULATORY_CARE_PROVIDER_SITE_OTHER): Payer: Medicare Other

## 2017-08-17 DIAGNOSIS — I4891 Unspecified atrial fibrillation: Secondary | ICD-10-CM

## 2017-08-17 DIAGNOSIS — I482 Chronic atrial fibrillation, unspecified: Secondary | ICD-10-CM

## 2017-08-17 DIAGNOSIS — Z953 Presence of xenogenic heart valve: Secondary | ICD-10-CM | POA: Diagnosis not present

## 2017-08-17 DIAGNOSIS — Z5181 Encounter for therapeutic drug level monitoring: Secondary | ICD-10-CM | POA: Diagnosis not present

## 2017-08-17 LAB — POCT INR: INR: 3.1

## 2017-08-17 NOTE — Patient Instructions (Signed)
Please have a large serving of greens today and continue dosage of 1 tablet daily.   Recheck in 3 weeks.

## 2017-08-18 DIAGNOSIS — L821 Other seborrheic keratosis: Secondary | ICD-10-CM | POA: Diagnosis not present

## 2017-08-18 DIAGNOSIS — I8392 Asymptomatic varicose veins of left lower extremity: Secondary | ICD-10-CM | POA: Diagnosis not present

## 2017-08-18 DIAGNOSIS — D229 Melanocytic nevi, unspecified: Secondary | ICD-10-CM | POA: Diagnosis not present

## 2017-08-18 DIAGNOSIS — L853 Xerosis cutis: Secondary | ICD-10-CM | POA: Diagnosis not present

## 2017-08-18 DIAGNOSIS — D692 Other nonthrombocytopenic purpura: Secondary | ICD-10-CM | POA: Diagnosis not present

## 2017-08-18 DIAGNOSIS — Z1283 Encounter for screening for malignant neoplasm of skin: Secondary | ICD-10-CM | POA: Diagnosis not present

## 2017-08-18 DIAGNOSIS — Z85820 Personal history of malignant melanoma of skin: Secondary | ICD-10-CM | POA: Diagnosis not present

## 2017-08-18 DIAGNOSIS — Z85828 Personal history of other malignant neoplasm of skin: Secondary | ICD-10-CM | POA: Diagnosis not present

## 2017-08-18 DIAGNOSIS — L814 Other melanin hyperpigmentation: Secondary | ICD-10-CM | POA: Diagnosis not present

## 2017-08-18 DIAGNOSIS — D1800 Hemangioma unspecified site: Secondary | ICD-10-CM | POA: Diagnosis not present

## 2017-08-25 ENCOUNTER — Other Ambulatory Visit: Payer: Self-pay | Admitting: Family Medicine

## 2017-08-25 DIAGNOSIS — N649 Disorder of breast, unspecified: Secondary | ICD-10-CM

## 2017-08-28 ENCOUNTER — Telehealth: Payer: Self-pay | Admitting: Cardiovascular Disease

## 2017-08-28 NOTE — Telephone Encounter (Signed)
[employer] calling about prior auth for Elizabeth Hall with aetna  please call  [phone]     [phone] fax

## 2017-08-31 NOTE — Telephone Encounter (Signed)
S/w [employer]. Now that they know a prior Josem Kaufmann is not required, they will continue to work on patient's patient assistance application and determine patient's eligibility for 2019.

## 2017-08-31 NOTE — Telephone Encounter (Signed)
S/w [employer]. Elizabeth Hall is a Tier 3 medication and does not need prior authorization. No prior auth required.

## 2017-09-07 ENCOUNTER — Ambulatory Visit: Payer: Medicare Other

## 2017-09-07 ENCOUNTER — Ambulatory Visit (INDEPENDENT_AMBULATORY_CARE_PROVIDER_SITE_OTHER): Payer: Medicare Other

## 2017-09-07 ENCOUNTER — Ambulatory Visit: Admission: RE | Admit: 2017-09-07 | Payer: Medicare Other | Source: Ambulatory Visit

## 2017-09-07 ENCOUNTER — Ambulatory Visit
Admission: RE | Admit: 2017-09-07 | Discharge: 2017-09-07 | Disposition: A | Discharge status: Home / Self Care | Payer: Medicare Other | Source: Ambulatory Visit | Attending: Family Medicine | Admitting: Family Medicine

## 2017-09-07 ENCOUNTER — Other Ambulatory Visit: Payer: Self-pay | Admitting: Family Medicine

## 2017-09-07 DIAGNOSIS — I4891 Unspecified atrial fibrillation: Secondary | ICD-10-CM | POA: Diagnosis not present

## 2017-09-07 DIAGNOSIS — Z1231 Encounter for screening mammogram for malignant neoplasm of breast: Secondary | ICD-10-CM | POA: Diagnosis not present

## 2017-09-07 DIAGNOSIS — Z5181 Encounter for therapeutic drug level monitoring: Secondary | ICD-10-CM | POA: Diagnosis not present

## 2017-09-07 DIAGNOSIS — Z953 Presence of xenogenic heart valve: Secondary | ICD-10-CM

## 2017-09-07 DIAGNOSIS — I482 Chronic atrial fibrillation, unspecified: Secondary | ICD-10-CM

## 2017-09-07 DIAGNOSIS — Z9289 Personal history of other medical treatment: Secondary | ICD-10-CM

## 2017-09-07 DIAGNOSIS — N649 Disorder of breast, unspecified: Secondary | ICD-10-CM

## 2017-09-07 LAB — POCT INR: INR: 3

## 2017-09-07 NOTE — Patient Instructions (Signed)
Please have a large serving of greens today and continue dosage of 1 tablet daily.   Recheck in 4 weeks.

## 2017-09-21 DIAGNOSIS — H35032 Hypertensive retinopathy, left eye: Secondary | ICD-10-CM | POA: Diagnosis not present

## 2017-09-21 DIAGNOSIS — H401233 Low-tension glaucoma, bilateral, severe stage: Secondary | ICD-10-CM | POA: Diagnosis not present

## 2017-09-22 DIAGNOSIS — M65331 Trigger finger, right middle finger: Secondary | ICD-10-CM | POA: Diagnosis not present

## 2017-10-05 ENCOUNTER — Ambulatory Visit (INDEPENDENT_AMBULATORY_CARE_PROVIDER_SITE_OTHER): Payer: Medicare Other

## 2017-10-05 DIAGNOSIS — Z5181 Encounter for therapeutic drug level monitoring: Secondary | ICD-10-CM

## 2017-10-05 DIAGNOSIS — I4891 Unspecified atrial fibrillation: Secondary | ICD-10-CM | POA: Diagnosis not present

## 2017-10-05 DIAGNOSIS — I482 Chronic atrial fibrillation, unspecified: Secondary | ICD-10-CM

## 2017-10-05 DIAGNOSIS — Z953 Presence of xenogenic heart valve: Secondary | ICD-10-CM | POA: Diagnosis not present

## 2017-10-05 LAB — POCT INR: INR: 3.1

## 2017-10-05 NOTE — Patient Instructions (Signed)
Please have a large serving of greens today and continue dosage of 1 tablet daily.   Recheck in 4 weeks.

## 2017-10-06 DIAGNOSIS — H401233 Low-tension glaucoma, bilateral, severe stage: Secondary | ICD-10-CM | POA: Diagnosis not present

## 2017-10-22 DIAGNOSIS — H401233 Low-tension glaucoma, bilateral, severe stage: Secondary | ICD-10-CM | POA: Diagnosis not present

## 2017-10-27 ENCOUNTER — Ambulatory Visit (INDEPENDENT_AMBULATORY_CARE_PROVIDER_SITE_OTHER): Payer: Medicare Other | Admitting: Cardiovascular Disease

## 2017-10-27 ENCOUNTER — Encounter: Payer: Self-pay | Admitting: Cardiovascular Disease

## 2017-10-27 VITALS — BP 100/60 | HR 80 | Ht 63.0 in | Wt 137.0 lb

## 2017-10-27 DIAGNOSIS — I10 Essential (primary) hypertension: Secondary | ICD-10-CM

## 2017-10-27 DIAGNOSIS — I059 Rheumatic mitral valve disease, unspecified: Secondary | ICD-10-CM | POA: Diagnosis not present

## 2017-10-27 DIAGNOSIS — I482 Chronic atrial fibrillation, unspecified: Secondary | ICD-10-CM

## 2017-10-27 DIAGNOSIS — I5022 Chronic systolic (congestive) heart failure: Secondary | ICD-10-CM | POA: Diagnosis not present

## 2017-10-27 NOTE — Patient Instructions (Signed)
Medication Instructions: - Your physician recommends that you continue on your current medications as directed. Please refer to the Current Medication list given to you today.  Labwork: - Your physician recommends that you have lab work today: BMP  Procedures/Testing: - none ordered  Follow-Up: - Your physician wants you to follow-up in: 6 months with Dr. Arida. You will receive a reminder letter in the mail two months in advance. If you don't receive a letter, please call our office to schedule the follow-up appointment.   Any Additional Special Instructions Will Be Listed Below (If Applicable).     If you need a refill on your cardiac medications before your next appointment, please call your pharmacy.   

## 2017-10-27 NOTE — Progress Notes (Signed)
Cardiology Office Note   Date:  10/27/2017   ID:  Elizabeth Hall, DOB 1941/06/16, MRN [id number]  PCP:  Ria Bush, MD  Cardiologist:   Kathlyn Sacramento, MD   Chief Complaint  Patient presents with  . Other    6 month follow up. Patient denies chest pain and states the SOB is "Much better." Meds reviewed verbally with patient.       History of Present Illness: Elizabeth Hall is a 77 y.o. female who presents for  a follow-up visit. 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 Congestive heart failure was diagnosed in 2007.  She has known history of atrial fibrillation. She underwent AV nodal ablation and pacemaker placement in November 2013. 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 Her ejection fraction deteriorated and she underwent explantation of the previous pacemaker and implantation of biventricular ICD in March 2015. She had CVA in the past when her warfarin was held for a procedure. Since then, she has been bridged with low molecular weight heparin.  Echocardiogram in 12/2015 showed an ejection fraction of 43-32%, grade 2 diastolic dysfunction, replaced mitral valve with moderate regurgitation and a mean gradient of 5 mmHg. Left atrium was severely dilated. Pulmonary pressure could not be estimated. ABI was normal in 02/2016.  She had worsening She had worsening symptoms of heart failure in 2018. A right and left cardiac catheterization in July of 2018 showed mild nonobstructive coronary artery disease.  Ejection fraction was 40%.  Right heart catheterization showed normal LVEDP, normal cardiac output and no significant pulmonary hypertension.  There was no evidence of left-to-right intra-atrial shunt.  There was mildly increased gradient across the mitral valve prosthesis with a  mean gradient of 8 mmHg with a valve area of 1.48.  The patient attended cardiac rehab and has been able to lose about 15 pounds with significant improvement in symptoms overall.  She has no orthopnea or PND.  No recent chest pain.  She reports significant improvement in dyspnea.  Past Medical History:  Diagnosis Date  . Bleeding of eye   . Chronic systolic heart failure ([place])    a. 11/2016 Echo: EF 35-40%, mild MR w/ nl fxning bioprosthesis, mod dil LA.  . CKD (chronic kidney disease) stage 3, GFR 30-59 ml/min (HCC)    baseline Cr 1.5-2; prior saw nephrologist thought hypertensive nephropathy/renovascular disease  . CVA (cerebral infarction)    a. 2014 occipital lobe - some vision loss when coumadin held and not bridged  . Dyslipidemia   . Glaucoma   . H. pylori infection    treated  . H/O mitral valve replacement    a. 05/2012 s/p 37mm Carpentier Edwards pericardial tissue valve.  [name] Hx of rheumatic fever   . Hypertension   . Melanoma ([place]) 2006   L shoulder  . NICM (nonischemic cardiomyopathy) ([place])    a. 08/2013 s/p Biotronik Ilesto 7 HF1 BiV ICD (ser# [id number]);  b. 11/2016 Echo: EF 35-40%.  . Non-obstructive CAD    a. s/p MI 2001;  b. 12/2016 Cath: LM 20ost, OM2 60, RCA 30p/d, EF 35-45%. Nl R heart filling pressures.  . Permanent atrial fibrillation ([place])    a. 04/2012 s/p AVN & PPM placement (later upgraded to BiV ICD).  [name] Phlebitis   . 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 06/2012 h/o acute ischemia due to thromboembolism radial and ulnar arteries when coumadin held s/p atherectomy - needs bridging if off coumadin  . Tricuspid regurgitation    a. 05/2012 s/p TV annuloplasty @ time of MVR.    Past Surgical History:  Procedure Laterality Date  . ABDOMINAL HYSTERECTOMY    . ABLATION  2013  . ATHERECTOMY  06/2012   left arm after coumadin held without bridging  . BREAST BIOPSY    . CARDIOVASCULAR STRESS TEST  03/2016   WNL  . CATARACT EXTRACTION Bilateral  2007, 2010  . COLONOSCOPY  03/2014   diverticulosis, int hem, rpt 5 yrs for h/o polyps ([place])  . defibrillator     MEDTRONIC 5086 MRI 52 CM LEAD, SERIAL # [id number]  . DEXA  07/2016   WNL  . DG ABDOMEN COMPLETE ([place] HX)  02/2013   HH, mod severe erosive gastritis, duodenitis  . GALLBLADDER SURGERY  2006  . INSERT / REPLACE / REMOVE PACEMAKER  08/2013  . MITRAL VALVE REPLACEMENT  05/2012  . RIGHT/LEFT HEART CATH AND CORONARY ANGIOGRAPHY N/A 12/29/2016   Procedure: Right/Left Heart Cath and Coronary Angiography;  Surgeon: [name], MD;  Location: [facility];  Service: Cardiovascular;  Laterality: N/A;  . TOTAL ABDOMINAL HYSTERECTOMY W/ BILATERAL SALPINGOOPHORECTOMY  1991   irregular bleeding  . TRICUSPID VALVE REPLACEMENT  05/2012  . US ECHOCARDIOGRAPHY  02/2014   EF 40%, LA marked dilation, gen LV hypokinesis, mitral prosthetic valve     Current Outpatient Medications  Medication Sig Dispense Refill  . acetaminophen (TYLENOL) 500 MG tablet Take 500-1,000 mg by mouth every 6 (six) hours as needed (for headaches/pain.).    [name] aspirin EC 81 MG tablet Take 81 mg by mouth daily.    . bumetanide (BUMEX) 2 MG tablet TAKE 1 TABLET (2 MG TOTAL) BY MOUTH DAILY. 90 tablet 1  . carvedilol (COREG) 12.5 MG tablet Take 1 tablet (12.5 mg total) by mouth 2 (two) times daily with a meal. 180 tablet 3  . estradiol (ESTRACE) 1 MG tablet Take 0.5 mg by mouth 2 (two) times a week.     [name] LUMIGAN 0.01 % SOLN     . Magnesium 250 MG TABS Take 250 mg by mouth daily.    . meclizine (ANTIVERT) 25 MG tablet Take 1 tablet (25 mg total) by mouth 2 (two) times daily as needed for dizziness. 180 tablet 0  . oseltamivir (TAMIFLU) 75 MG capsule Take 1 capsule (75 mg total) by mouth daily. 10 capsule 0  . Polyethyl Glycol-Propyl Glycol (LUBRICANT EYE DROPS) 0.4-0.3 % SOLN Place 1 drop into both eyes 3 (three) times daily as needed (for dry eyes.).    [name] sacubitril-valsartan (ENTRESTO) 24-26 MG Take 1 tablet  2 (two) times daily by mouth. 180 tablet 3  . spironolactone (ALDACTONE) 25 MG tablet TAKE 1/2 TABLET(12.5 MG) BY MOUTH DAILY 30 tablet 3  . vitamin E 400 UNIT capsule Take 400 Units by mouth daily.    [name] warfarin (COUMADIN) 3 MG tablet Take as directed by [facility] 90 tablet 1   No current facility-administered medications for this visit.     Allergies:   Amiodarone; Phytonadione [vitamin k1]; Biaxin [clarithromycin]; Ciprofloxacin; Flagyl [metronidazole]; and Lisinopril    Social History:  The patient  reports that she quit smoking about 27 years ago. Her smoking use included cigarettes. She has a 10.00 pack-year smoking history. She has never used smokeless tobacco. She reports that she  drinks alcohol. She reports that she does not use drugs.   Family History:  The patient's family history includes CAD (age of onset: 23) in her father; Cancer in her cousin, cousin, and maternal aunt; Diabetes in her cousin; Heart failure in her brother and sister; Hypertension in her brother; Stroke in her brother and sister.    ROS:  Please see the history of present illness.   Otherwise, review of systems are positive for none.   All other systems are reviewed and negative.    PHYSICAL EXAM: VS:  BP 100/60 (BP Location: Left Arm, Patient Position: Sitting, Cuff Size: Normal)   Pulse 80   Ht 5\' 3"  (1.6 m)   Wt 137 lb (62.1 kg)   LMP  (LMP Unknown) Comment: menopause age 13  BMI 24.27 kg/m  , BMI Body mass index is 24.27 kg/m. GEN: Well nourished, well developed, in no acute distress  HEENT: normal  Neck: no JVD, carotid bruits, or masses Cardiac: RRR; no murmurs, rubs, or gallops,no edema  Respiratory:  clear to auscultation bilaterally, normal work of breathing GI: soft, nontender, nondistended, + BS MS: no deformity or atrophy  Skin: warm and dry, no rash Neuro:  Strength and sensation are intact Psych: euthymic mood, full affect   EKG:  EKG ordered today. EKG showed atrial  fibrillation with demand ventricular pacing   Recent Labs: 12/19/2016: Hemoglobin 13.3; Platelets 235 07/20/2017: BUN 32; Creatinine, Ser 1.33; Potassium 4.3; Sodium 139    Lipid Panel    Component Value Date/Time   CHOL 170 07/20/2017 1240   CHOL 192 06/12/2014   TRIG 81.0 07/20/2017 1240   TRIG 70 06/12/2014   HDL 54.70 07/20/2017 1240   CHOLHDL 3 07/20/2017 1240   VLDL 16.2 07/20/2017 1240   LDLCALC 99 07/20/2017 1240   LDLCALC 104 06/12/2014      Wt Readings from Last 3 Encounters:  10/27/17 137 lb (62.1 kg)  08/04/17 144 lb 12 oz (65.7 kg)  07/27/17 149 lb 14.4 oz (68 kg)         ASSESSMENT AND PLAN:  1.  Chronic systolic heart failure:  Most recent ejection fraction was 35-40%.    She appears to be euvolemic and currently on optimal medical therapy.  Continue same medications.  I requested basic metabolic profile.  We might need to consider decreasing Bumex especially with continued weight loss.  2. Chronic atrial fibrillation: Continue [place]-term anticoagulation with warfarin.   3. Essential hypertension: Blood pressure is well controlled on current medications.  4. Status post mitral valve replacement with bioprosthetic valve: Acceptable function on most recent cardiac catheterization.  Mildly increased gradient as expected.   Disposition:   FU with me in 6 months  Signed,  Kathlyn Sacramento, MD  10/27/2017 3:28 PM    [facility]

## 2017-10-28 ENCOUNTER — Telehealth: Payer: Self-pay | Admitting: Cardiovascular Disease

## 2017-10-28 LAB — BASIC METABOLIC PANEL
BUN / CREAT RATIO: 26 (ref 12–28)
BUN: 38 mg/dL — ABNORMAL HIGH (ref 8–27)
CHLORIDE: 101 mmol/L (ref 96–106)
CO2: 22 mmol/L (ref 20–29)
Calcium: 9.8 mg/dL (ref 8.7–10.3)
Creatinine, Ser: 1.47 mg/dL — ABNORMAL HIGH (ref 0.57–1.00)
GFR calc Af Amer: 40 mL/min/{1.73_m2} — ABNORMAL LOW (ref >59)
GFR calc non Af Amer: 35 mL/min/{1.73_m2} — ABNORMAL LOW (ref >59)
GLUCOSE: 106 mg/dL — AB (ref 65–99)
POTASSIUM: 4.2 mmol/L (ref 3.5–5.2)
SODIUM: 142 mmol/L (ref 134–144)

## 2017-10-28 MED ORDER — BUMETANIDE 2 MG PO TABS: ORAL_TABLET | ORAL | Status: DC

## 2017-10-28 NOTE — Telephone Encounter (Signed)
Notes recorded by Emily Filbert, RN on 10/28/2017 at 4:40 PM EDT The patient is aware of her lab results. She is aware of Dr. Tyrell Antonio recommendations to hold bumex x 2 days then restart bumex at 1 mg once daily. She states the tablets are scored so she will cut the 2 mg tablets of bumex in 1/2. She is due to see Dr. Caryl Comes on 5/28- will plan to repeat a BMP at that office visit.  The patient verbalizes understanding and is agreeable. ------  Notes recorded by Emily Filbert, RN on 10/28/2017 at 1:20 PM EDT I left a message for the patient to call back. ------  Notes recorded by [name], MD on 10/28/2017 at 12:24 PM EDT Inform patient that labs showed mild dehydration. Recommend holding Bumex for 2 days then resuming at a lower dose of 1 mg daily. Repeat BMP in 1 month.

## 2017-11-02 ENCOUNTER — Ambulatory Visit (INDEPENDENT_AMBULATORY_CARE_PROVIDER_SITE_OTHER): Payer: Medicare Other

## 2017-11-02 DIAGNOSIS — I4891 Unspecified atrial fibrillation: Secondary | ICD-10-CM

## 2017-11-02 DIAGNOSIS — Z5181 Encounter for therapeutic drug level monitoring: Secondary | ICD-10-CM | POA: Diagnosis not present

## 2017-11-02 DIAGNOSIS — I482 Chronic atrial fibrillation, unspecified: Secondary | ICD-10-CM

## 2017-11-02 DIAGNOSIS — Z953 Presence of xenogenic heart valve: Secondary | ICD-10-CM

## 2017-11-02 LAB — POCT INR: INR: 2.2

## 2017-11-02 NOTE — Patient Instructions (Signed)
Please take extra 1/2 tablet today and  continue dosage of 1 tablet daily.   Recheck in 4 weeks.

## 2017-11-03 DIAGNOSIS — Z124 Encounter for screening for malignant neoplasm of cervix: Secondary | ICD-10-CM | POA: Diagnosis not present

## 2017-11-03 DIAGNOSIS — Z6824 Body mass index (BMI) 24.0-24.9, adult: Secondary | ICD-10-CM | POA: Diagnosis not present

## 2017-11-10 ENCOUNTER — Ambulatory Visit (INDEPENDENT_AMBULATORY_CARE_PROVIDER_SITE_OTHER): Payer: Medicare Other | Admitting: *Deleted

## 2017-11-10 DIAGNOSIS — I482 Chronic atrial fibrillation, unspecified: Secondary | ICD-10-CM

## 2017-11-10 NOTE — Progress Notes (Signed)
Remote ICD transmission.   

## 2017-11-11 ENCOUNTER — Other Ambulatory Visit: Payer: Self-pay | Admitting: Cardiovascular Disease

## 2017-11-11 NOTE — Telephone Encounter (Signed)
Refill Request.  

## 2017-11-12 ENCOUNTER — Other Ambulatory Visit: Payer: Self-pay | Admitting: Cardiovascular Disease

## 2017-11-12 LAB — CUP PACEART REMOTE DEVICE CHECK
Date Time Interrogation Session: 20190523102502
Implantable Lead Implant Date: 20150316
Implantable Lead Implant Date: 20150316
Implantable Lead Location: 753858
Implantable Lead Location: 753860
Implantable Lead Model: 346
Implantable Lead Serial Number: 25130583
MDC IDC PG IMPLANT DT: 20150316
MDC IDC PG SERIAL: 60765179

## 2017-11-17 ENCOUNTER — Ambulatory Visit (INDEPENDENT_AMBULATORY_CARE_PROVIDER_SITE_OTHER): Payer: Medicare Other | Admitting: Internal Medicine

## 2017-11-17 ENCOUNTER — Encounter: Payer: Self-pay | Admitting: Internal Medicine

## 2017-11-17 VITALS — BP 102/76 | HR 75 | Ht 63.0 in | Wt 137.5 lb

## 2017-11-17 DIAGNOSIS — Z79899 Other long term (current) drug therapy: Secondary | ICD-10-CM

## 2017-11-17 DIAGNOSIS — I428 Other cardiomyopathies: Secondary | ICD-10-CM | POA: Diagnosis not present

## 2017-11-17 DIAGNOSIS — I482 Chronic atrial fibrillation, unspecified: Secondary | ICD-10-CM

## 2017-11-17 DIAGNOSIS — I5022 Chronic systolic (congestive) heart failure: Secondary | ICD-10-CM | POA: Diagnosis not present

## 2017-11-17 DIAGNOSIS — I442 Atrioventricular block, complete: Secondary | ICD-10-CM

## 2017-11-17 DIAGNOSIS — Z95810 Presence of automatic (implantable) cardiac defibrillator: Secondary | ICD-10-CM

## 2017-11-17 NOTE — Patient Instructions (Signed)
Medication Instructions: - Your physician recommends that you continue on your current medications as directed. Please refer to the Current Medication list given to you today.  Labwork: - Your physician recommends that you have lab work today: BMP (for Dr. Fletcher Anon).  Procedures/Testing: - none ordered  Follow-Up: - Remote monitoring is used to monitor your Pacemaker of ICD from home. This monitoring reduces the number of office visits required to check your device to one time per year. It allows [place] to keep an eye on the functioning of your device to ensure it is working properly. You are scheduled for a device check from home on 02/09/18. You may send your transmission at any time that day. If you have a wireless device, the transmission will be sent automatically. After your physician reviews your transmission, you will receive a postcard with your next transmission date.  - Your physician wants you to follow-up in: 1 year with Dr. Caryl Comes. You will receive a reminder letter in the mail two months in advance. If you don't receive a letter, please call our office to schedule the follow-up appointment.   Any Additional Special Instructions Will Be Listed Below (If Applicable).     If you need a refill on your cardiac medications before your next appointment, please call your pharmacy.

## 2017-11-17 NOTE — Progress Notes (Signed)
Electrophysiology Office Note   Date:  11/17/2017   ID:  Ailie, Gage Apr 06, 1941, MRN [id number]  PCP:  Ria Bush, MD  Cardiologist:  MA Primary Electrophysiologist:   Virl Axe, MD    Chief Complaint  Patient presents with  . other    12 month f/u no complaints today. Meds reviewed verbally with pt.     History of Present Illness: Shalana Jardin Wieting is a 77 y.o. female is seen for pacemaker followup having moved from [place] to be with family  She has hx of AFib and underwent AV nodal ablation and pacemaker placement in November 2013. 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 Her ejection fraction deteriorated and she underwent explantation of the previous pacemaker and implantation of biventricular ICD in March 2015.  She has hx of CVA with holding of anticoagulation; currently INR taret 2.5--3 with asa 81;  Hx of visual bleeding    Her sob is much reduced following pulm rehab and weight loss ( about 10%)  No SOB  occ edema   On Anticoagulation;  No bleeding issues   She has occasional dizziness for which she takes mecliizine with resolutionwithin minutes.  Has never attempted to address by sitting, as some spells occur while seated;  No recent falls; the last was misstep off ladder    DATE TEST    8/15    Echo   EF 40 %   7/17    Echo   EF 35-40 %   10/17  myoview EF 54-50% NO ischemia   Date Cr K  Hgb  1/18 1.3 4.6  12.8  5/19  1.47 4.2        The patient is tolerating medications without difficulties and is otherwise without complaint today.    Past Medical History:  Diagnosis Date  . Bleeding of eye   . Chronic systolic heart failure ([place])    a. 11/2016 Echo: EF 35-40%, mild MR w/ nl fxning bioprosthesis, mod dil LA.  . CKD (chronic kidney disease) stage 3, GFR 30-59 ml/min (HCC)    baseline Cr 1.5-2; prior saw nephrologist thought hypertensive nephropathy/renovascular disease  . CVA (cerebral infarction)    a. 2014 occipital lobe - some vision loss when coumadin held and not bridged  . Dyslipidemia   . Glaucoma   . H. pylori infection    treated  . H/O mitral valve replacement    a. 05/2012 s/p 44mm Carpentier Edwards pericardial tissue valve.  [name] Hx of rheumatic fever   . Hypertension   . Melanoma ([place]) 2006   L shoulder  . NICM (nonischemic cardiomyopathy) ([place])    a. 08/2013 s/p Biotronik Ilesto 7 HF1 BiV ICD (ser# [id number]);  b. 11/2016 Echo: EF 35-40%.  . Non-obstructive CAD    a. s/p MI 2001;  b. 12/2016 Cath: LM 20ost, OM2 60, RCA 30p/d, EF 35-45%. Nl R heart filling pressures.  . Permanent atrial fibrillation ([place])    a. 04/2012 s/p AVN & PPM placement (later upgraded to BiV ICD).  [name] Phlebitis   . 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 06/2012 h/o acute ischemia due to thromboembolism radial and ulnar arteries when coumadin held s/p atherectomy - needs bridging if off coumadin  . Tricuspid regurgitation    a. 05/2012 s/p TV annuloplasty @ time of MVR.   Past Surgical History:  Procedure Laterality Date  . ABDOMINAL HYSTERECTOMY    . ABLATION  2013  . ATHERECTOMY  06/2012   left arm after coumadin held without bridging  . BREAST BIOPSY    . CARDIOVASCULAR STRESS TEST  03/2016   WNL  . CATARACT EXTRACTION Bilateral 2007, 2010  . COLONOSCOPY  03/2014   diverticulosis, int hem, rpt 5 yrs for h/o polyps ([place])  . defibrillator     MEDTRONIC 5086 MRI 52 CM LEAD, SERIAL # [id number]  . DEXA  07/2016   WNL  . DG ABDOMEN COMPLETE ([place] [facility]X)  02/2013   HH, mod severe erosive gastritis, duodenitis  . GALLBLADDER SURGERY  2006  . INSERT / REPLACE / REMOVE PACEMAKER  08/2013  . MITRAL VALVE REPLACEMENT  05/2012  . RIGHT/LEFT HEART CATH AND CORONARY ANGIOGRAPHY N/A 12/29/2016   Procedure: Right/Left Heart Cath and Coronary Angiography;  Surgeon:  [name], MD;  Location: [facility];  Service: Cardiovascular;  Laterality: N/A;  . TOTAL ABDOMINAL HYSTERECTOMY W/ BILATERAL SALPINGOOPHORECTOMY  1991   irregular bleeding  . TRICUSPID VALVE REPLACEMENT  05/2012  . US ECHOCARDIOGRAPHY  02/2014   EF 40%, LA marked dilation, gen LV hypokinesis, mitral prosthetic valve     Current Outpatient Medications  Medication Sig Dispense Refill  . acetaminophen (TYLENOL) 500 MG tablet Take 500-1,000 mg by mouth every 6 (six) hours as needed (for headaches/pain.).    [name] aspirin EC 81 MG tablet Take 81 mg by mouth daily.    . bumetanide (BUMEX) 2 MG tablet Take 0.5 tablet (1 mg) by mouth once daily    . carvedilol (COREG) 12.5 MG tablet Take 1 tablet (12.5 mg total) by mouth 2 (two) times daily with a meal. 180 tablet 3  . LUMIGAN 0.01 % SOLN     . Magnesium 250 MG TABS Take 250 mg by mouth daily.    . meclizine (ANTIVERT) 25 MG tablet Take 1 tablet (25 mg total) by mouth 2 (two) times daily as needed for dizziness. 180 tablet 0  . Polyethyl Glycol-Propyl Glycol (LUBRICANT EYE DROPS) 0.4-0.3 % SOLN Place 1 drop into both eyes 3 (three) times daily as needed (for dry eyes.).    [name] sacubitril-valsartan (ENTRESTO) 24-26 MG Take 1 tablet 2 (two) times daily by mouth. 180 tablet 3  . spironolactone (ALDACTONE) 25 MG tablet TAKE 1/2 TABLET(12.5 MG) BY MOUTH DAILY 30 tablet 2  . vitamin E 400 UNIT capsule Take 400 Units by mouth daily.    [name] warfarin (COUMADIN) 3 MG tablet TAKE AS DIRECTED BY COUMADIN CLINIC 90 tablet 1   No current facility-administered medications for this visit.     Allergies:   Amiodarone; Phytonadione [vitamin k1]; Biaxin [clarithromycin]; Ciprofloxacin; Flagyl [metronidazole]; and Lisinopril   Social History:  The patient  reports that she quit smoking about 27 years ago. Her smoking use included cigarettes. She has a 10.00 pack-year smoking history. She has never used smokeless tobacco. She reports that she drinks  alcohol. She reports that she does not use drugs.   Family History:  The patient's    family history includes CAD (age of onset: 68) in her father; Cancer in her cousin, cousin, and maternal aunt; Diabetes in her cousin; Heart failure in her brother and sister; Hypertension in her brother; Stroke in her brother and sister.    ROS:  Please see the history of present illness and past medical history  Otherwise, all other systems were reviewed and were negative.  PHYSICAL EXAM: VS:  BP 102/76 (BP Location: Left Arm, Patient Position: Sitting, Cuff Size: Normal)   Pulse 75   Ht 5\' 3"  (1.6 m)   Wt 137 lb 8 oz (62.4 kg)   LMP  (LMP Unknown) Comment: menopause age 63  BMI 24.36 kg/m  , BMI Body mass index is 24.36 kg/m. Well developed and nourished in no acute distress HENT normal Neck supple with JVP-flat Clear Regular rate and rhythm, no murmurs or gallops Abd-soft with active BS No Clubbing cyanosis edema Skin-warm and dry A & Oriented  Grossly normal sensory and motor function   EKG:    5/18  afib with Vpacing   QRS morphology negative lead V1 upright in lead 1 5/19   Chest x-ray personally reviewed 4/16 LV lead posterior defibrillator lead is in the right ventricular outflow tract    Device interrogation is reviewed today in detail.  See PaceArt for details.   Recent Labs: 12/19/2016: Hemoglobin 13.3; Platelets 235 10/27/2017: BUN 38; Creatinine, Ser 1.47; Potassium 4.2; Sodium 142    Lipid Panel     Component Value Date/Time   CHOL 170 07/20/2017 1240   CHOL 192 06/12/2014   TRIG 81.0 07/20/2017 1240   TRIG 70 06/12/2014   HDL 54.70 07/20/2017 1240   CHOLHDL 3 07/20/2017 1240   VLDL 16.2 07/20/2017 1240   LDLCALC 99 07/20/2017 1240   LDLCALC 104 06/12/2014     Wt Readings from Last 3 Encounters:  11/17/17 137 lb 8 oz (62.4 kg)  10/27/17 137 lb (62.1 kg)  08/04/17 144 lb 12 oz (65.7 kg)      Other studies Reviewed: Additional studies/ records that were  reviewed today include: echo reports  Demonstrating : As above    ASSESSMENT AND PLAN: NICM  Complete heart block  S/p AV Ablation  ICD - CRT Biotronik  Atrial Fib  Permanent  CVA  Dizziness  Congestive heart failure/chronic-systolic-class 2  High risk medication surveillance  ECG-unusual for CRT  Dyspnea on exertion  Renal insufficiency grade 3   .On Anticoagulation;  No bleeding issues   Will check CBC  I worry that her dizziness is hypotension and not vertigo,  Her BP range 98--110 and if persists would want to reduce afterload  Have asked her to be quite proactive in letting us know  Renal function mildly worse and diuretis recently downtitrated-- repeat BMET today  Euvolemic continue current meds  We spent more than 50% of our >25 min visit in face to face counseling regarding the above     Signed, Virl Axe, MD  11/17/2017 10:11 AM     [address] [place] [address] [phone] (office) [phone] (fax)

## 2017-11-18 LAB — CBC WITH DIFFERENTIAL/PLATELET
Basophils Absolute: 0 10*3/uL (ref 0.0–0.2)
Basos: 1 %
EOS (ABSOLUTE): 0.2 10*3/uL (ref 0.0–0.4)
Eos: 3 %
HEMOGLOBIN: 12.4 g/dL (ref 11.1–15.9)
Hematocrit: 36.8 % (ref 34.0–46.6)
Immature Grans (Abs): 0 10*3/uL (ref 0.0–0.1)
Immature Granulocytes: 0 %
LYMPHS ABS: 2.2 10*3/uL (ref 0.7–3.1)
Lymphs: 36 %
MCH: 30.5 pg (ref 26.6–33.0)
MCHC: 33.7 g/dL (ref 31.5–35.7)
MCV: 91 fL (ref 79–97)
MONOS ABS: 0.7 10*3/uL (ref 0.1–0.9)
Monocytes: 11 %
NEUTROS ABS: 3.1 10*3/uL (ref 1.4–7.0)
Neutrophils: 49 %
Platelets: 238 10*3/uL (ref 150–450)
RBC: 4.06 x10E6/uL (ref 3.77–5.28)
RDW: 14.8 % (ref 12.3–15.4)
WBC: 6.3 10*3/uL (ref 3.4–10.8)

## 2017-11-18 LAB — BASIC METABOLIC PANEL
BUN / CREAT RATIO: 23 (ref 12–28)
BUN: 27 mg/dL (ref 8–27)
CO2: 21 mmol/L (ref 20–29)
Calcium: 10.1 mg/dL (ref 8.7–10.3)
Chloride: 103 mmol/L (ref 96–106)
Creatinine, Ser: 1.18 mg/dL — ABNORMAL HIGH (ref 0.57–1.00)
GFR calc non Af Amer: 45 mL/min/{1.73_m2} — ABNORMAL LOW (ref >59)
GFR, EST AFRICAN AMERICAN: 52 mL/min/{1.73_m2} — AB (ref >59)
GLUCOSE: 75 mg/dL (ref 65–99)
Potassium: 4.6 mmol/L (ref 3.5–5.2)
SODIUM: 140 mmol/L (ref 134–144)

## 2017-11-28 ENCOUNTER — Other Ambulatory Visit: Payer: Self-pay | Admitting: Cardiovascular Disease

## 2017-11-30 ENCOUNTER — Ambulatory Visit (INDEPENDENT_AMBULATORY_CARE_PROVIDER_SITE_OTHER): Payer: Medicare Other

## 2017-11-30 DIAGNOSIS — Z953 Presence of xenogenic heart valve: Secondary | ICD-10-CM

## 2017-11-30 DIAGNOSIS — I482 Chronic atrial fibrillation, unspecified: Secondary | ICD-10-CM

## 2017-11-30 DIAGNOSIS — I4891 Unspecified atrial fibrillation: Secondary | ICD-10-CM

## 2017-11-30 DIAGNOSIS — Z5181 Encounter for therapeutic drug level monitoring: Secondary | ICD-10-CM

## 2017-11-30 LAB — POCT INR: INR: 3 (ref 2.0–3.0)

## 2017-11-30 NOTE — Patient Instructions (Signed)
Please continue dosage of 1 tablet daily. Recheck in 5 weeks.  

## 2017-12-08 DIAGNOSIS — M7061 Trochanteric bursitis, right hip: Secondary | ICD-10-CM | POA: Insufficient documentation

## 2017-12-16 LAB — CUP PACEART INCLINIC DEVICE CHECK
Implantable Lead Implant Date: 20150316
Implantable Lead Location: 753858
Implantable Lead Location: 753860
Implantable Lead Model: 346
Implantable Lead Serial Number: 25130583
MDC IDC LEAD IMPLANT DT: 20150316
MDC IDC PG IMPLANT DT: 20150316
MDC IDC PG SERIAL: 60765179
MDC IDC SESS DTM: 20190626104059
Pulse Gen Model: 383547

## 2018-01-04 ENCOUNTER — Ambulatory Visit (INDEPENDENT_AMBULATORY_CARE_PROVIDER_SITE_OTHER): Payer: Medicare Other

## 2018-01-04 DIAGNOSIS — I482 Chronic atrial fibrillation, unspecified: Secondary | ICD-10-CM

## 2018-01-04 DIAGNOSIS — I4891 Unspecified atrial fibrillation: Secondary | ICD-10-CM

## 2018-01-04 DIAGNOSIS — Z5181 Encounter for therapeutic drug level monitoring: Secondary | ICD-10-CM

## 2018-01-04 DIAGNOSIS — Z953 Presence of xenogenic heart valve: Secondary | ICD-10-CM

## 2018-01-04 LAB — POCT INR: INR: 2 (ref 2.0–3.0)

## 2018-01-04 NOTE — Patient Instructions (Signed)
Please take 1.5 tablets today, then continue dosage of 1 tablet daily.   Recheck in 4 weeks.

## 2018-01-25 DIAGNOSIS — H53462 Homonymous bilateral field defects, left side: Secondary | ICD-10-CM | POA: Diagnosis not present

## 2018-02-01 ENCOUNTER — Ambulatory Visit (INDEPENDENT_AMBULATORY_CARE_PROVIDER_SITE_OTHER): Payer: Medicare Other

## 2018-02-01 DIAGNOSIS — I482 Chronic atrial fibrillation, unspecified: Secondary | ICD-10-CM

## 2018-02-01 DIAGNOSIS — Z953 Presence of xenogenic heart valve: Secondary | ICD-10-CM

## 2018-02-01 DIAGNOSIS — I4891 Unspecified atrial fibrillation: Secondary | ICD-10-CM

## 2018-02-01 DIAGNOSIS — Z5181 Encounter for therapeutic drug level monitoring: Secondary | ICD-10-CM

## 2018-02-01 LAB — POCT INR: INR: 2.3 (ref 2.0–3.0)

## 2018-02-01 NOTE — Patient Instructions (Signed)
Please START NEW DOSAGE OF 1 tablet daily, except 1.5 tablets on Mondays & Fridays. Recheck in 4 weeks.

## 2018-02-09 ENCOUNTER — Ambulatory Visit (INDEPENDENT_AMBULATORY_CARE_PROVIDER_SITE_OTHER): Payer: Medicare Other | Admitting: *Deleted

## 2018-02-09 DIAGNOSIS — I428 Other cardiomyopathies: Secondary | ICD-10-CM

## 2018-02-09 DIAGNOSIS — I4891 Unspecified atrial fibrillation: Secondary | ICD-10-CM | POA: Diagnosis not present

## 2018-02-09 NOTE — Progress Notes (Signed)
Remote ICD transmission.   

## 2018-03-01 ENCOUNTER — Ambulatory Visit (INDEPENDENT_AMBULATORY_CARE_PROVIDER_SITE_OTHER): Payer: Medicare Other

## 2018-03-01 DIAGNOSIS — Z5181 Encounter for therapeutic drug level monitoring: Secondary | ICD-10-CM

## 2018-03-01 DIAGNOSIS — I482 Chronic atrial fibrillation, unspecified: Secondary | ICD-10-CM

## 2018-03-01 DIAGNOSIS — I4891 Unspecified atrial fibrillation: Secondary | ICD-10-CM

## 2018-03-01 DIAGNOSIS — Z953 Presence of xenogenic heart valve: Secondary | ICD-10-CM | POA: Diagnosis not present

## 2018-03-01 DIAGNOSIS — Z23 Encounter for immunization: Secondary | ICD-10-CM | POA: Diagnosis not present

## 2018-03-01 LAB — POCT INR: INR: 3.2 — AB (ref 2.0–3.0)

## 2018-03-01 NOTE — Patient Instructions (Signed)
Please take 1/2 tablet today, then resume dosage of 1 tablet daily, except 1.5 tablets on Mondays & Fridays. Recheck in 4 weeks.

## 2018-03-08 ENCOUNTER — Telehealth: Payer: Self-pay | Admitting: Cardiovascular Disease

## 2018-03-08 ENCOUNTER — Encounter: Payer: Self-pay | Admitting: Family Medicine

## 2018-03-08 ENCOUNTER — Other Ambulatory Visit: Payer: Self-pay | Admitting: Cardiovascular Disease

## 2018-03-08 NOTE — Telephone Encounter (Signed)
Patient calling the office for samples of medication:   1.  What medication and dosage are you requesting samples for?  Entresto 24-26 mg po BID  2.  Are you currently out of this medication? Will be out before arrival from [employer]

## 2018-03-08 NOTE — Telephone Encounter (Signed)
Pt c/o medication issue:  1. Name of Medication:  Entresto 24-26 mg po BID   2. How are you currently taking this medication (dosage and times per day)?   3. Are you having a reaction (difficulty breathing--STAT)? no  4. What is your medication issue?   [employer] needs a new rx to send patient medication please call [phone]  To expedite

## 2018-03-08 NOTE — Telephone Encounter (Signed)
Called returned to [employer]. Verbal refill given for Entresto 24-26 mg bid for 180 tablets (3 months supply) and one refill.

## 2018-03-08 NOTE — Telephone Encounter (Signed)
Patient notified no samples available of Entresto 24-26 mg.  The patient is aware that the Entresto 24-26 mg has been verbally called to the pharmacy.  The patient is aware we have contacted the pharmaceutical company to get samples to help.

## 2018-03-11 NOTE — Telephone Encounter (Signed)
S/w patient. She got her shipment of Entresto today. So she does not need any samples at this time. She was very Patent attorney.

## 2018-03-11 NOTE — Telephone Encounter (Signed)
Patient calling to make office aware she has received her Entresto medication  No longer in need of samples

## 2018-03-11 NOTE — Telephone Encounter (Signed)
Patient calling to check on samples . lmov if no answer

## 2018-03-19 LAB — CUP PACEART REMOTE DEVICE CHECK
HighPow Impedance: 49 Ohm
Implantable Lead Implant Date: 20150316
Implantable Lead Location: 753858
Implantable Lead Location: 753860
Implantable Lead Model: 346
Implantable Lead Serial Number: 25130583
Implantable Pulse Generator Implant Date: 20150316
Lead Channel Impedance Value: 423 Ohm
Lead Channel Impedance Value: 488 Ohm
Lead Channel Pacing Threshold Pulse Width: 0.4 ms
Lead Channel Setting Pacing Amplitude: 2.5 V
Lead Channel Setting Pacing Pulse Width: 0.4 ms
Lead Channel Setting Sensing Sensitivity: 0.8 mV
MDC IDC LEAD IMPLANT DT: 20150316
MDC IDC MSMT BATTERY REMAINING PERCENTAGE: 57 %
MDC IDC MSMT BATTERY VOLTAGE: 2.96 V
MDC IDC MSMT LEADCHNL RV PACING THRESHOLD AMPLITUDE: 0.6 V
MDC IDC SESS DTM: 20190927054217
MDC IDC SET LEADCHNL LV SENSING SENSITIVITY: 1.6 mV
MDC IDC SET LEADCHNL RV PACING AMPLITUDE: 2.5 V
MDC IDC SET LEADCHNL RV PACING PULSEWIDTH: 0.4 ms
MDC IDC STAT BRADY RV PERCENT PACED: 99 %
Pulse Gen Model: 383547
Pulse Gen Serial Number: 60765179

## 2018-03-24 ENCOUNTER — Telehealth: Payer: Self-pay | Admitting: Cardiovascular Disease

## 2018-03-24 NOTE — Telephone Encounter (Signed)
Returned the call to the patient. She stated that she has felt like her heart beat has been irregular for several days now. She is being paced so her heart rate has been steady at 70-75. When she has the irregular beat moments, she gets short of breath otherwise she is not symptomatic. Blood pressure and heart rate this morning were 126/80 and heart rate was 75.  She is unable to do a download from home. She was unable to come in today for an EKG due to going to work. She has been scheduled an appointment tomorrow with Dr. Fletcher Anon.

## 2018-03-24 NOTE — Telephone Encounter (Signed)
Patient calling stating starting yesterday she's been feeling this "irregular rates'  She states she feels It in her throat. She not had this since her last ablation in 2013  She is not sure who to see or what to do about this No other symptoms, just concerned that it hasn't happen in a [place] time  No pains  Has no recordings of her beats    Would like a call back as soon as we get a chance

## 2018-03-25 ENCOUNTER — Ambulatory Visit (INDEPENDENT_AMBULATORY_CARE_PROVIDER_SITE_OTHER): Payer: Medicare Other | Admitting: Cardiovascular Disease

## 2018-03-25 ENCOUNTER — Ambulatory Visit (INDEPENDENT_AMBULATORY_CARE_PROVIDER_SITE_OTHER): Payer: Medicare Other

## 2018-03-25 ENCOUNTER — Encounter: Payer: Self-pay | Admitting: Cardiovascular Disease

## 2018-03-25 VITALS — BP 100/56 | HR 76 | Ht 63.0 in | Wt 130.2 lb

## 2018-03-25 DIAGNOSIS — I10 Essential (primary) hypertension: Secondary | ICD-10-CM

## 2018-03-25 DIAGNOSIS — I5022 Chronic systolic (congestive) heart failure: Secondary | ICD-10-CM

## 2018-03-25 DIAGNOSIS — I4891 Unspecified atrial fibrillation: Secondary | ICD-10-CM

## 2018-03-25 DIAGNOSIS — R002 Palpitations: Secondary | ICD-10-CM | POA: Diagnosis not present

## 2018-03-25 DIAGNOSIS — I059 Rheumatic mitral valve disease, unspecified: Secondary | ICD-10-CM | POA: Diagnosis not present

## 2018-03-25 NOTE — Patient Instructions (Signed)
Medication Instructions: Your physician recommends that you continue on your current medications as directed. Please refer to the Current Medication list given to you today.  If you need a refill on your cardiac medications before your next appointment, please call your pharmacy.   Labwork: Your provider would like for you to have the following labs today: BMET, CBC, TSH and Magnesium   Procedures/Testing: Your physician has recommended that you wear a 3 day ZIO event monitor. Event monitors are medical devices that record the heart's electrical activity. Doctors most often [place] these monitors to diagnose arrhythmias. Arrhythmias are problems with the speed or rhythm of the heartbeat. The monitor is a small, portable device. You can wear one while you do your normal daily activities. This is usually used to diagnose what is causing palpitations/syncope (passing out).    Follow-Up: Your physician wants you to follow-up in 6 months with Dr. Fletcher Anon. You will receive a reminder letter in the mail two months in advance. If you don't receive a letter, please call our office at [phone] to schedule this follow-up appointment.   Thank you for choosing [facility] at [facility]!

## 2018-03-25 NOTE — Progress Notes (Signed)
Cardiology Office Note   Date:  03/25/2018   ID:  Jayni Prescher, DOB 1940/10/29, MRN [id number]  PCP:  Ria Bush, MD  Cardiologist:   Kathlyn Sacramento, MD   Chief Complaint  Patient presents with  . other    Pt. c/o irreg. heart beats & shortness of breath when has the arrhythmias. Meds reviewed by the pt. verbally.       History of Present Illness: Elizabeth Hall is a 77 y.o. female who presents for  a follow-up visit. 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 Congestive heart failure was diagnosed in 2007.  She has known history of atrial fibrillation. She underwent AV nodal ablation and pacemaker placement in November 2013. 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 Her ejection fraction deteriorated and she underwent explantation of the previous pacemaker and implantation of biventricular ICD in March 2015. She had CVA in the past when her warfarin was held for a procedure. Since then, she has been bridged with low molecular weight heparin.  Echocardiogram in 12/2015 showed an ejection fraction of 67-12%, grade 2 diastolic dysfunction, replaced mitral valve with moderate regurgitation and a mean gradient of 5 mmHg. Left atrium was severely dilated. Pulmonary pressure could not be estimated. ABI was normal in 02/2016.  She had worsening symptoms of heart failure in 2018. A right and left cardiac catheterization in July of 2018 showed mild nonobstructive coronary artery disease.  Ejection fraction was 40%.  Right heart catheterization showed normal LVEDP, normal cardiac output and no significant pulmonary hypertension.  There was no evidence of left-to-right intra-atrial shunt.  There was mildly increased gradient across the mitral valve prosthesis with a mean gradient of 8 mmHg  with a valve area of 1.48.  She improved significantly with medical therapy with improvement in lifestyle.  She was able to lose some weight and felt great over the last few months.  However, recently she started having intermittent episodes of palpitations with feeling of something stuck in her throat.  The symptoms reminded her of her previous episodes of atrial fibrillation before she had AV nodal ablation.  No chest pain.   Past Medical History:  Diagnosis Date  . Bleeding of eye   . Chronic systolic heart failure ([place])    a. 11/2016 Echo: EF 35-40%, mild MR w/ nl fxning bioprosthesis, mod dil LA.  . CKD (chronic kidney disease) stage 3, GFR 30-59 ml/min (HCC)    baseline Cr 1.5-2; prior saw nephrologist thought hypertensive nephropathy/renovascular disease  . CVA (cerebral infarction)    a. 2014 occipital lobe - some vision loss when coumadin held and not bridged  . Dyslipidemia   . Glaucoma   . H. pylori infection    treated  . H/O mitral valve replacement    a. 05/2012 s/p 65mm Carpentier Edwards pericardial tissue valve.  [name] Hx of rheumatic fever   . Hypertension   . Melanoma ([place]) 2006   L shoulder  . NICM (nonischemic cardiomyopathy) ([place])    a. 08/2013 s/p Biotronik Ilesto 7 HF1 BiV ICD (ser# [id number]);  b. 11/2016 Echo: EF 35-40%.  . Non-obstructive CAD    a. s/p MI 2001;  b. 12/2016 Cath: LM 20ost, OM2 60, RCA 30p/d, EF 35-45%. Nl R heart filling pressures.  . Permanent atrial fibrillation    a. 04/2012 s/p  AVN & PPM placement (later upgraded to BiV ICD).  [name] Phlebitis   . 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 06/2012 h/o acute ischemia due to thromboembolism radial and ulnar arteries when coumadin held s/p atherectomy - needs bridging if off coumadin  . Tricuspid regurgitation    a. 05/2012 s/p TV annuloplasty @ time of MVR.    Past Surgical History:  Procedure Laterality Date  . ABDOMINAL HYSTERECTOMY    . ABLATION  2013  . ATHERECTOMY  06/2012   left  arm after coumadin held without bridging  . BREAST BIOPSY    . CARDIOVASCULAR STRESS TEST  03/2016   WNL  . CATARACT EXTRACTION Bilateral 2007, 2010  . COLONOSCOPY  03/2014   diverticulosis, int hem, rpt 5 yrs for h/o polyps ([place])  . defibrillator     MEDTRONIC 5086 MRI 52 CM LEAD, SERIAL # [id number]  . DEXA  07/2016   WNL  . DG ABDOMEN COMPLETE ([place] HX)  02/2013   HH, mod severe erosive gastritis, duodenitis  . GALLBLADDER SURGERY  2006  . INSERT / REPLACE / REMOVE PACEMAKER  08/2013  . MITRAL VALVE REPLACEMENT  05/2012  . RIGHT/LEFT HEART CATH AND CORONARY ANGIOGRAPHY N/A 12/29/2016   Procedure: Right/Left Heart Cath and Coronary Angiography;  Surgeon: [name], MD;  Location: [facility];  Service: Cardiovascular;  Laterality: N/A;  . TOTAL ABDOMINAL HYSTERECTOMY W/ BILATERAL SALPINGOOPHORECTOMY  1991   irregular bleeding  . TRICUSPID VALVE REPLACEMENT  05/2012  . US ECHOCARDIOGRAPHY  02/2014   EF 40%, LA marked dilation, gen LV hypokinesis, mitral prosthetic valve     Current Outpatient Medications  Medication Sig Dispense Refill  . acetaminophen (TYLENOL) 500 MG tablet Take 500-1,000 mg by mouth every 6 (six) hours as needed (for headaches/pain.).    [name] aspirin EC 81 MG tablet Take 81 mg by mouth daily.    . bumetanide (BUMEX) 2 MG tablet Take 0.5 tablet (1 mg) by mouth once daily    . bumetanide (BUMEX) 2 MG tablet TAKE 1 TABLET (2 MG TOTAL) BY MOUTH DAILY. 90 tablet 3  . carvedilol (COREG) 12.5 MG tablet Take 1 tablet (12.5 mg total) by mouth 2 (two) times daily with a meal. 180 tablet 3  . LUMIGAN 0.01 % SOLN     . Magnesium 250 MG TABS Take 250 mg by mouth daily.    . meclizine (ANTIVERT) 25 MG tablet Take 1 tablet (25 mg total) by mouth 2 (two) times daily as needed for dizziness. 180 tablet 0  . Polyethyl Glycol-Propyl Glycol (LUBRICANT EYE DROPS) 0.4-0.3 % SOLN Place 1 drop into both eyes 3 (three) times daily as needed (for dry eyes.).    [name]  sacubitril-valsartan (ENTRESTO) 24-26 MG Take 1 tablet 2 (two) times daily by mouth. 180 tablet 3  . spironolactone (ALDACTONE) 25 MG tablet TAKE 1/2 TABLET(12.5 MG) BY MOUTH DAILY 30 tablet 2  . vitamin E 400 UNIT capsule Take 400 Units by mouth daily.    [name] warfarin (COUMADIN) 3 MG tablet TAKE AS DIRECTED BY COUMADIN CLINIC 90 tablet 1   No current facility-administered medications for this visit.     Allergies:   Phytonadione; Amiodarone; Ciprofloxacin; Clarithromycin; Lisinopril; Metronidazole; and Phytonadione [vitamin k1]    Social History:  The patient  reports that she quit smoking about 28 years ago. Her smoking use included cigarettes. She has a 10.00 pack-year smoking history. She has never used smokeless tobacco. She reports  that she drinks alcohol. She reports that she does not use drugs.   Family History:  The patient's family history includes CAD (age of onset: 46) in her father; Cancer in her cousin, cousin, and maternal aunt; Diabetes in her cousin; Heart failure in her brother and sister; Hypertension in her brother; Stroke in her brother and sister.    ROS:  Please see the history of present illness.   Otherwise, review of systems are positive for none.   All other systems are reviewed and negative.    PHYSICAL EXAM: VS:  BP (!) 100/56 (BP Location: Left Arm, Patient Position: Sitting, Cuff Size: Normal)   Pulse 76   Ht 5\' 3"  (1.6 m)   Wt 130 lb 4 oz (59.1 kg)   LMP  (LMP Unknown) Comment: menopause age 69  BMI 23.07 kg/m  , BMI Body mass index is 23.07 kg/m. GEN: Well nourished, well developed, in no acute distress  HEENT: normal  Neck: no JVD, carotid bruits, or masses Cardiac: RRR; no murmurs, rubs, or gallops,no edema  Respiratory:  clear to auscultation bilaterally, normal work of breathing GI: soft, nontender, nondistended, + BS MS: no deformity or atrophy  Skin: warm and dry, no rash Neuro:  Strength and sensation are intact Psych: euthymic mood, full  affect   EKG:  EKG ordered today. EKG showed atrial fibrillation with biventricular pacing.   Recent Labs: 11/17/2017: BUN 27; Creatinine, Ser 1.18; Hemoglobin 12.4; Platelets 238; Potassium 4.6; Sodium 140    Lipid Panel    Component Value Date/Time   CHOL 170 07/20/2017 1240   CHOL 192 06/12/2014   TRIG 81.0 07/20/2017 1240   TRIG 70 06/12/2014   HDL 54.70 07/20/2017 1240   CHOLHDL 3 07/20/2017 1240   VLDL 16.2 07/20/2017 1240   LDLCALC 99 07/20/2017 1240   LDLCALC 104 06/12/2014      Wt Readings from Last 3 Encounters:  03/25/18 130 lb 4 oz (59.1 kg)  11/17/17 137 lb 8 oz (62.4 kg)  10/27/17 137 lb (62.1 kg)         ASSESSMENT AND PLAN:  1.  Chronic systolic heart failure:  Most recent ejection fraction was 35-40%.    She appears to be euvolemic and currently on optimal medical therapy.  She is currently on Bumex 1 mg daily.  Given her complaints of palpitations, I am going to check her routine labs.  2. Chronic atrial fibrillation: It is not entirely clear why she is having palpitations especially that she had AV nodal ablation.  Pacemaker mediated tachycardia is a possibility.  I am going to obtain a 3-day outpatient monitor given that her symptoms are almost happening on a daily basis.  I will also check routine labs including TSH.  3. Essential hypertension: Blood pressure is well controlled on current medications.  4. Status post mitral valve replacement with bioprosthetic valve: Acceptable function on most recent cardiac catheterization.  Mildly increased gradient as expected.   Disposition:   FU with me in 6 months  Signed,  Kathlyn Sacramento, MD  03/25/2018 10:52 AM    [place]

## 2018-03-26 LAB — CBC
HEMATOCRIT: 37 % (ref 34.0–46.6)
HEMOGLOBIN: 12.5 g/dL (ref 11.1–15.9)
MCH: 29.1 pg (ref 26.6–33.0)
MCHC: 33.8 g/dL (ref 31.5–35.7)
MCV: 86 fL (ref 79–97)
Platelets: 250 10*3/uL (ref 150–450)
RBC: 4.29 x10E6/uL (ref 3.77–5.28)
RDW: 13.1 % (ref 12.3–15.4)
WBC: 6.7 10*3/uL (ref 3.4–10.8)

## 2018-03-26 LAB — BASIC METABOLIC PANEL
BUN/Creatinine Ratio: 32 — ABNORMAL HIGH (ref 12–28)
BUN: 45 mg/dL — ABNORMAL HIGH (ref 8–27)
CALCIUM: 9.6 mg/dL (ref 8.7–10.3)
CHLORIDE: 102 mmol/L (ref 96–106)
CO2: 20 mmol/L (ref 20–29)
CREATININE: 1.4 mg/dL — AB (ref 0.57–1.00)
GFR calc Af Amer: 42 mL/min/{1.73_m2} — ABNORMAL LOW (ref >59)
GFR, EST NON AFRICAN AMERICAN: 37 mL/min/{1.73_m2} — AB (ref >59)
GLUCOSE: 93 mg/dL (ref 65–99)
POTASSIUM: 4.6 mmol/L (ref 3.5–5.2)
SODIUM: 142 mmol/L (ref 134–144)

## 2018-03-26 LAB — TSH: TSH: 1.29 u[IU]/mL (ref 0.450–4.500)

## 2018-03-26 LAB — MAGNESIUM: Magnesium: 2.4 mg/dL — ABNORMAL HIGH (ref 1.6–2.3)

## 2018-03-28 DIAGNOSIS — I4891 Unspecified atrial fibrillation: Secondary | ICD-10-CM

## 2018-03-28 DIAGNOSIS — R002 Palpitations: Secondary | ICD-10-CM | POA: Diagnosis not present

## 2018-03-29 ENCOUNTER — Ambulatory Visit (INDEPENDENT_AMBULATORY_CARE_PROVIDER_SITE_OTHER): Payer: Medicare Other

## 2018-03-29 DIAGNOSIS — Z953 Presence of xenogenic heart valve: Secondary | ICD-10-CM | POA: Diagnosis not present

## 2018-03-29 DIAGNOSIS — Z5181 Encounter for therapeutic drug level monitoring: Secondary | ICD-10-CM | POA: Diagnosis not present

## 2018-03-29 DIAGNOSIS — Z7189 Other specified counseling: Secondary | ICD-10-CM

## 2018-03-29 DIAGNOSIS — I4891 Unspecified atrial fibrillation: Secondary | ICD-10-CM

## 2018-03-29 LAB — POCT INR: INR: 3.3 — AB (ref 2.0–3.0)

## 2018-03-29 NOTE — Patient Instructions (Signed)
Please take 1/2 tablet tomorrow, then START NEW DOSAGE of 1 tablet every day.  Recheck in 3 weeks.

## 2018-04-01 ENCOUNTER — Telehealth: Payer: Self-pay | Admitting: *Deleted

## 2018-04-01 DIAGNOSIS — I482 Chronic atrial fibrillation, unspecified: Secondary | ICD-10-CM | POA: Diagnosis not present

## 2018-04-01 MED ORDER — BUMETANIDE 0.5 MG PO TABS: 0.5000 mg | ORAL_TABLET | Freq: Every day | ORAL | 6 refills | Status: DC

## 2018-04-01 NOTE — Telephone Encounter (Signed)
Results called to pt. Pt verbalized understanding of results and plan of care to decrease Bumex to 0.5 mg daily, monitor weight and go back to 1 mg if needed. Rx sent to pharmacy.

## 2018-04-01 NOTE — Telephone Encounter (Signed)
-----   Message from [name], MD sent at 03/31/2018  5:13 PM EDT ----- Inform patient that labs were normal except for slight dehydration.  I recommend decreasing Bumex to 0.5 mg once daily.  She should continue to monitor her weight and if she starts gaining fluids, she can will go back on 1 mg daily.

## 2018-04-05 ENCOUNTER — Telehealth: Payer: Self-pay | Admitting: Cardiovascular Disease

## 2018-04-05 NOTE — Telephone Encounter (Signed)
I spoke with the patient regarding her Zio monitor results and Dr. Tyrell Antonio recommendations:  Notes recorded by [name], MD on 04/02/2018 at 4:40 PM EDT Monitor showed occasional PVCs which are not serious. It did also show intermittent tachycardia which correlated with some of her symptoms. Please schedule an appointment with Dr. Caryl Comes to evaluate this and check her pacemaker. She is known to him  She voices understanding and is agreeable. The first day that would work for her schedule to come is Thursday 04/15/18 at 8:45 am.  The patient is aware of this date/ time and agreeable.

## 2018-04-05 NOTE — Telephone Encounter (Signed)
I left a message for the patient to call regarding her ZIO results.

## 2018-04-13 ENCOUNTER — Other Ambulatory Visit: Payer: Self-pay | Admitting: Cardiovascular Disease

## 2018-04-15 ENCOUNTER — Encounter: Payer: Self-pay | Admitting: Internal Medicine

## 2018-04-15 ENCOUNTER — Ambulatory Visit (INDEPENDENT_AMBULATORY_CARE_PROVIDER_SITE_OTHER): Payer: Medicare Other | Admitting: Internal Medicine

## 2018-04-15 VITALS — BP 104/70 | HR 82 | Ht 63.0 in | Wt 137.5 lb

## 2018-04-15 DIAGNOSIS — I5022 Chronic systolic (congestive) heart failure: Secondary | ICD-10-CM

## 2018-04-15 DIAGNOSIS — Z95810 Presence of automatic (implantable) cardiac defibrillator: Secondary | ICD-10-CM | POA: Diagnosis not present

## 2018-04-15 DIAGNOSIS — I442 Atrioventricular block, complete: Secondary | ICD-10-CM

## 2018-04-15 DIAGNOSIS — I428 Other cardiomyopathies: Secondary | ICD-10-CM | POA: Diagnosis not present

## 2018-04-15 NOTE — Patient Instructions (Signed)
Medication Instructions:  - Your physician recommends that you continue on your current medications as directed. Please refer to the Current Medication list given to you today.  If you need a refill on your cardiac medications before your next appointment, please call your pharmacy.   Lab work: - none ordered  If you have labs (blood work) drawn today and your tests are completely normal, you will receive your results only by: [name] MyChart Message (if you have MyChart) OR . A paper copy in the mail If you have any lab test that is abnormal or we need to change your treatment, we will call you to review the results.  Testing/Procedures: - none ordered  Follow-Up: At [facility], you and your health needs are our priority.  As part of our continuing mission to provide you with exceptional heart care, we have created designated Provider Care Teams.  These Care Teams include your primary Cardiologist (physician) and Advanced Practice Providers (APPs -  Physician Assistants and Nurse Practitioners) who all work together to provide you with the care you need, when you need it. [name] 2-3 months with Dr. Caryl Comes  Any Other Special Instructions Will Be Listed Below (If Applicable). - N/A

## 2018-04-15 NOTE — Progress Notes (Signed)
Patient Care Team: Ria Bush, MD as PCP - General (Family Medicine) [name] [name], MD as Referring Physician (Ophthalmology) [name], MD as Consulting Physician (Cardiology) [name], MD as Consulting Physician (Obstetrics and Gynecology) [name], MD as Consulting Physician (Cardiology) [name], MD as Consulting Physician (Dermatology)   HPI  Elizabeth Hall is a 77 y.o. female  Elizabeth Hall is a 77 y.o. female is seen today with complaints of tachypalpitations.  A ZIO Patch was ordered and demonstrated symptoms associated with ventricular pacing at variable rates including her upper sensor rate.  Notably, her atrial lead is disconnected   She has hx of AFib and underwent AV nodal ablation and pacemaker placement in November 2013. 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 Her ejection fraction deteriorated and she underwent explantation of the previous pacemaker and implantation of biventricular ICD in March 2015.  She has hx of CVA with holding of anticoagulation; currently INR taret 2.5--3 with asa 81;  Hx of visual bleeding      On Anticoagulation;  No bleeding issues   She is seen today with complaints  DATE TEST    8/15    Echo   EF 40 %   7/17    Echo   EF 35-40 %   10/17  myoview EF 54-50% NO ischemia   Date Cr K  Hgb  1/18 1.3 4.6  12.8  5/19  1.47 4.2    Records and Results Reviewed   Past Medical History:  Diagnosis Date  . Bleeding of eye   . Chronic systolic heart failure ([place])    a. 11/2016 Echo: EF 35-40%, mild MR w/ nl fxning bioprosthesis, mod dil LA.  . CKD (chronic kidney disease) stage 3, GFR 30-59 ml/min (HCC)    baseline Cr 1.5-2; prior saw nephrologist thought hypertensive nephropathy/renovascular disease  . CVA (cerebral infarction)     a. 2014 occipital lobe - some vision loss when coumadin held and not bridged  . Dyslipidemia   . Glaucoma   . H. pylori infection    treated  . H/O mitral valve replacement    a. 05/2012 s/p 82mm Carpentier Edwards pericardial tissue valve.  [name] Hx of rheumatic fever   . Hypertension   . Melanoma ([place]) 2006   L shoulder  . NICM (nonischemic cardiomyopathy) ([place])    a. 08/2013 s/p Biotronik Ilesto 7 HF1 BiV ICD (ser# [id number]);  b. 11/2016 Echo: EF 35-40%.  . Non-obstructive CAD    a. s/p MI 2001;  b. 12/2016 Cath: LM 20ost, OM2 60, RCA 30p/d, EF 35-45%. Nl R heart filling pressures.  . Permanent atrial fibrillation    a. 04/2012 s/p AVN & PPM placement (later upgraded to BiV ICD).  [name] Phlebitis   . 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 06/2012 h/o acute ischemia due to thromboembolism radial and ulnar arteries when coumadin held s/p atherectomy - needs bridging if off coumadin  . Tricuspid regurgitation    a. 05/2012 s/p TV annuloplasty @ time of MVR.    Past Surgical History:  Procedure Laterality Date  . ABDOMINAL HYSTERECTOMY    . ABLATION  2013  . ATHERECTOMY  06/2012   left arm after coumadin held without bridging  . BREAST BIOPSY    . CARDIOVASCULAR  STRESS TEST  03/2016   WNL  . CATARACT EXTRACTION Bilateral 2007, 2010  . COLONOSCOPY  03/2014   diverticulosis, int hem, rpt 5 yrs for h/o polyps ([place])  . defibrillator     MEDTRONIC 5086 MRI 52 CM LEAD, SERIAL # [id number]  . DEXA  07/2016   WNL  . DG ABDOMEN COMPLETE ([place] HX)  02/2013   HH, mod severe erosive gastritis, duodenitis  . GALLBLADDER SURGERY  2006  . INSERT / REPLACE / REMOVE PACEMAKER  08/2013  . MITRAL VALVE REPLACEMENT  05/2012  . RIGHT/LEFT HEART CATH AND CORONARY ANGIOGRAPHY N/A 12/29/2016   Procedure: Right/Left Heart Cath and Coronary Angiography;  Surgeon: [name], MD;  Location: [facility];  Service: Cardiovascular;  Laterality: N/A;  . TOTAL ABDOMINAL HYSTERECTOMY W/  BILATERAL SALPINGOOPHORECTOMY  1991   irregular bleeding  . TRICUSPID VALVE REPLACEMENT  05/2012  . US ECHOCARDIOGRAPHY  02/2014   EF 40%, LA marked dilation, gen LV hypokinesis, mitral prosthetic valve    Current Meds  Medication Sig  . acetaminophen (TYLENOL) 500 MG tablet Take 500-1,000 mg by mouth every 6 (six) hours as needed (for headaches/pain.).  [name] aspirin EC 81 MG tablet Take 81 mg by mouth daily.  . bumetanide (BUMEX) 0.5 MG tablet Take 1 tablet (0.5 mg total) by mouth daily. Continue to monitor weight, if you start gaining fluids, then increase back to 1 mg daily.  . carvedilol (COREG) 12.5 MG tablet TAKE 1 TABLET (12.5 MG TOTAL) BY MOUTH 2 (TWO) TIMES DAILY WITH A MEAL.  [name] LUMIGAN 0.01 % SOLN   . Magnesium 250 MG TABS Take 250 mg by mouth daily.  . meclizine (ANTIVERT) 25 MG tablet Take 1 tablet (25 mg total) by mouth 2 (two) times daily as needed for dizziness.  Vladimir Faster Glycol-Propyl Glycol (LUBRICANT EYE DROPS) 0.4-0.3 % SOLN Place 1 drop into both eyes 3 (three) times daily as needed (for dry eyes.).  [name] sacubitril-valsartan (ENTRESTO) 24-26 MG Take 1 tablet 2 (two) times daily by mouth.  . spironolactone (ALDACTONE) 25 MG tablet TAKE 1/2 TABLET(12.5 MG) BY MOUTH DAILY  . vitamin E 400 UNIT capsule Take 400 Units by mouth daily.  [name] warfarin (COUMADIN) 3 MG tablet TAKE AS DIRECTED BY COUMADIN CLINIC    Allergies  Allergen Reactions  . Phytonadione Other (See Comments)  . Amiodarone Other (See Comments)    "Irritable"--IV INFUSION   . Ciprofloxacin Rash    Rash while on biaxin, cipro, flagyl (unsure which was inciting agent)  . Clarithromycin Rash    Rash while on biaxin, cipro, flagyl (unsure which was inciting agent)  . Lisinopril Rash  . Metronidazole Rash    Rash while on biaxin, cipro, flagyl (unsure which was inciting agent)  . Phytonadione [Vitamin K1] Other (See Comments)    "extremely faint"--IV INFUSION      Review of Systems negative except from HPI  and PMH  Physical Exam BP 104/70 (BP Location: Left Arm, Patient Position: Sitting, Cuff Size: Normal)   Pulse 82   Ht 5\' 3"  (1.6 m)   Wt 137 lb 8 oz (62.4 kg)   LMP  (LMP Unknown) Comment: menopause age 8  BMI 24.36 kg/m  Well developed and well nourished in no acute distress HENT normal E scleral and icterus clear Neck Supple JVP flat; carotids brisk and full Clear to ausculation  regular rate and rhythm, no murmurs gallops or rub Soft with active bowel sounds No clubbing cyanosis  Edema Alert and oriented, grossly normal motor and sensory function Skin Warm and Dry  ECG demonstrates atrial fibrillation with ventricular pacing  Assessment and  Plan  ASSESSMENT AND PLAN: NICM  Complete heart block  S/p AV Ablation  ICD - CRT Biotronik  Atrial Fib  Permanent  CVA  Dizziness  Congestive heart failure/chronic-systolic-class 2  High risk medication surveillance  ECG-unusual for CRT  Dyspnea on exertion  Renal insufficiency grade 3  Palpitations correlated with PVCs as well as tachycardia which appeared to be ventricular pacing at 140 bpm.  Device interrogation confirmed that there was no atrial lead hence, her ventricular pacing was driven by her sensor.  Indeed minimal activity was associated with an increase in her rate to 8090s and 25% of her heartbeats are faster than 80 bpm.  Hence, we have decreased her threshold for activity from medium to medium high decrease for upper sensor rate from 140--120.  Also having PVCs about 2% of the time.  On Anticoagulation;  No bleeding issues     We spent more than 50% of our >25 min visit in face to face counseling regarding the above    Current medicines are reviewed at length with the patient today .  The patient does not  have concerns regarding medicines.      Signed, Virl Axe, MD  04/15/2018 11:14 AM     [facility] [address] [place] [address] [phone]  (office) [phone] (fax)

## 2018-04-19 ENCOUNTER — Ambulatory Visit (INDEPENDENT_AMBULATORY_CARE_PROVIDER_SITE_OTHER): Payer: Medicare Other

## 2018-04-19 DIAGNOSIS — I4891 Unspecified atrial fibrillation: Secondary | ICD-10-CM | POA: Diagnosis not present

## 2018-04-19 DIAGNOSIS — Z5181 Encounter for therapeutic drug level monitoring: Secondary | ICD-10-CM

## 2018-04-19 DIAGNOSIS — Z953 Presence of xenogenic heart valve: Secondary | ICD-10-CM | POA: Diagnosis not present

## 2018-04-19 LAB — POCT INR: INR: 2.3 (ref 2.0–3.0)

## 2018-04-19 NOTE — Patient Instructions (Signed)
Please take extra 1/2 tablet today, then continue dosage of 1 tablet every day.  Recheck in 3 weeks.

## 2018-04-26 DIAGNOSIS — H53462 Homonymous bilateral field defects, left side: Secondary | ICD-10-CM | POA: Diagnosis not present

## 2018-05-10 ENCOUNTER — Ambulatory Visit (INDEPENDENT_AMBULATORY_CARE_PROVIDER_SITE_OTHER): Payer: Medicare Other

## 2018-05-10 DIAGNOSIS — I4891 Unspecified atrial fibrillation: Secondary | ICD-10-CM | POA: Diagnosis not present

## 2018-05-10 DIAGNOSIS — Z953 Presence of xenogenic heart valve: Secondary | ICD-10-CM | POA: Diagnosis not present

## 2018-05-10 DIAGNOSIS — Z5181 Encounter for therapeutic drug level monitoring: Secondary | ICD-10-CM

## 2018-05-10 LAB — POCT INR: INR: 2.7 (ref 2.0–3.0)

## 2018-05-10 NOTE — Patient Instructions (Signed)
Please continue dosage of 1 tablet every day. Recheck in 4 weeks. 

## 2018-05-11 ENCOUNTER — Ambulatory Visit (INDEPENDENT_AMBULATORY_CARE_PROVIDER_SITE_OTHER): Payer: Medicare Other

## 2018-05-11 DIAGNOSIS — I4891 Unspecified atrial fibrillation: Secondary | ICD-10-CM

## 2018-05-11 DIAGNOSIS — I428 Other cardiomyopathies: Secondary | ICD-10-CM

## 2018-05-12 ENCOUNTER — Other Ambulatory Visit: Payer: Self-pay | Admitting: Cardiovascular Disease

## 2018-05-12 NOTE — Progress Notes (Signed)
Remote ICD transmission.   

## 2018-05-14 ENCOUNTER — Other Ambulatory Visit: Payer: Self-pay | Admitting: Cardiovascular Disease

## 2018-05-14 NOTE — Telephone Encounter (Signed)
Please review for refill, Thanks !  

## 2018-05-17 ENCOUNTER — Telehealth: Payer: Self-pay | Admitting: Cardiovascular Disease

## 2018-05-17 MED ORDER — WARFARIN SODIUM 3 MG PO TABS: ORAL_TABLET | ORAL | 1 refills | Status: DC

## 2018-05-17 NOTE — Telephone Encounter (Signed)
Spironolactone sent in.  spironolactone (ALDACTONE) 25 MG tablet 45 tablet 1 05/12/2018    Sig: TAKE 1/2 TABLET(12.5 MG) BY MOUTH DAILY   Sent to pharmacy as: spironolactone (ALDACTONE) 25 MG tablet   E-Prescribing Status: Receipt confirmed by pharmacy (05/12/2018 7:46 AM EST)   Pharmacy   [id number] [address], [place]

## 2018-05-17 NOTE — Telephone Encounter (Signed)
Warfarin 3 mg refilled.   Pt also wanted spironolactone refilled - routing to refill team.

## 2018-05-17 NOTE — Telephone Encounter (Signed)
°*  STAT* If patient is at the pharmacy, call can be transferred to refill team.   1. Which medications need to be refilled? (please list name of each medication and dose if known)  warfarin (COUMADIN) 3 MG Spironolactone 25 MG 0.5 tablet daily   2. Which pharmacy/location (including street and city if local pharmacy) is medication to be sent to? [facility] [address]  3. Do they need a 30 day or 90 day supply? 90 day

## 2018-06-01 ENCOUNTER — Telehealth: Payer: Self-pay | Admitting: Cardiovascular Disease

## 2018-06-01 NOTE — Telephone Encounter (Signed)
Patient dropped off [employer] application form to be completed Patient is in the process of gathering the rest of the information  Would like to pick up form when completed to send altogether Placed in nurse box

## 2018-06-02 NOTE — Telephone Encounter (Signed)
Form given to Dr. Tyrell Antonio nurse.

## 2018-06-04 NOTE — Telephone Encounter (Signed)
Patient made aware that the application is ready and at the front desk for her.

## 2018-06-07 ENCOUNTER — Ambulatory Visit (INDEPENDENT_AMBULATORY_CARE_PROVIDER_SITE_OTHER): Payer: Medicare Other

## 2018-06-07 DIAGNOSIS — I4891 Unspecified atrial fibrillation: Secondary | ICD-10-CM | POA: Diagnosis not present

## 2018-06-07 DIAGNOSIS — Z953 Presence of xenogenic heart valve: Secondary | ICD-10-CM | POA: Diagnosis not present

## 2018-06-07 DIAGNOSIS — Z5181 Encounter for therapeutic drug level monitoring: Secondary | ICD-10-CM

## 2018-06-07 LAB — POCT INR: INR: 2.2 (ref 2.0–3.0)

## 2018-06-07 NOTE — Patient Instructions (Signed)
Please take extra 1/2 tablet today, then continue dosage of 1 tablet every day.  Recheck in 4 weeks.

## 2018-06-29 ENCOUNTER — Encounter: Payer: Medicare Other | Admitting: Internal Medicine

## 2018-07-05 ENCOUNTER — Ambulatory Visit (INDEPENDENT_AMBULATORY_CARE_PROVIDER_SITE_OTHER): Payer: Medicare Other

## 2018-07-05 DIAGNOSIS — Z5181 Encounter for therapeutic drug level monitoring: Secondary | ICD-10-CM

## 2018-07-05 DIAGNOSIS — I4891 Unspecified atrial fibrillation: Secondary | ICD-10-CM

## 2018-07-05 DIAGNOSIS — Z953 Presence of xenogenic heart valve: Secondary | ICD-10-CM

## 2018-07-05 LAB — POCT INR: INR: 2.4 (ref 2.0–3.0)

## 2018-07-05 NOTE — Patient Instructions (Signed)
Please START NEW DOSAGE OF 1 tablet every day EXCEPT 1.5 ON MONDAYS. Recheck in 4 weeks.

## 2018-07-07 LAB — CUP PACEART REMOTE DEVICE CHECK
Implantable Lead Implant Date: 20150316
Implantable Lead Implant Date: 20150316
Implantable Lead Location: 753858
Implantable Lead Location: 753860
Implantable Lead Model: 346
Implantable Lead Serial Number: 25130583
Implantable Pulse Generator Implant Date: 20150316
MDC IDC SESS DTM: 20200115080619
Pulse Gen Model: 383547
Pulse Gen Serial Number: 60765179

## 2018-07-12 ENCOUNTER — Other Ambulatory Visit: Payer: Self-pay | Admitting: Cardiovascular Disease

## 2018-07-12 MED ORDER — CARVEDILOL 12.5 MG PO TABS: 12.5000 mg | ORAL_TABLET | Freq: Two times a day (BID) | ORAL | 2 refills | Status: DC

## 2018-07-12 MED ORDER — BUMETANIDE 0.5 MG PO TABS: 0.5000 mg | ORAL_TABLET | Freq: Every day | ORAL | 2 refills | Status: DC

## 2018-07-12 NOTE — Telephone Encounter (Signed)
°*  STAT* If patient is at the pharmacy, call can be transferred to refill team.   1. Which medications need to be refilled? (please list name of each medication and dose if known)  carvedilol 12.5 MG - 1 tablet 2 times daily Bumetanide 0.5 MG - 1 tablet daily (takes 2 tablets when gaining extra fluid) would like more than 90 day   2. Which pharmacy/location (including street and city if local pharmacy) is medication to be sent to? [facility] fax [phone]  3. Do they need a 30 day or 90 day supply? 90 day

## 2018-07-16 ENCOUNTER — Telehealth: Payer: Self-pay | Admitting: Cardiovascular Disease

## 2018-07-16 ENCOUNTER — Other Ambulatory Visit: Payer: Self-pay

## 2018-07-16 MED ORDER — CARVEDILOL 12.5 MG PO TABS: 12.5000 mg | ORAL_TABLET | Freq: Two times a day (BID) | ORAL | 2 refills | Status: DC

## 2018-07-16 MED ORDER — BUMETANIDE 0.5 MG PO TABS: 0.5000 mg | ORAL_TABLET | Freq: Every day | ORAL | 2 refills | Status: DC

## 2018-07-16 NOTE — Telephone Encounter (Signed)
°*  STAT* If patient is at the pharmacy, call can be transferred to refill team.   1. Which medications need to be refilled? (please list name of each medication and dose if known)     Carvedilol 12.5 mg mg po BID     Bumetanide 0.5 mg po q d - 1 mg q d prn   2. Which pharmacy/location (including street and city if local pharmacy) is medication to be sent to? [facility] mail order   3. Do they need a 30 day or 90 day supply? [place]

## 2018-07-16 NOTE — Telephone Encounter (Signed)
Medications sent for a 90 day supply.

## 2018-07-29 ENCOUNTER — Telehealth: Payer: Self-pay | Admitting: Cardiovascular Disease

## 2018-07-29 ENCOUNTER — Other Ambulatory Visit: Payer: Self-pay

## 2018-07-29 MED ORDER — SPIRONOLACTONE 25 MG PO TABS: 12.5000 mg | ORAL_TABLET | Freq: Every day | ORAL | 0 refills | Status: DC

## 2018-07-29 NOTE — Telephone Encounter (Signed)
Requested Prescriptions   Signed Prescriptions Disp Refills  . spironolactone (ALDACTONE) 25 MG tablet 45 tablet 0    Sig: Take 0.5 tablets (12.5 mg total) by mouth daily.    Authorizing Provider: Minna Merritts    Ordering User: Janan Ridge

## 2018-07-29 NOTE — Telephone Encounter (Signed)
spironolactone (ALDACTONE) 25 MG tablet 45 tablet 0 07/29/2018    Sig - Route: Take 0.5 tablets (12.5 mg total) by mouth daily. - Oral   Sent to pharmacy as: spironolactone (ALDACTONE) 25 MG tablet   E-Prescribing Status: Receipt confirmed by pharmacy (07/29/2018 9:32 AM EST)   Pharmacy   [facility], [place]

## 2018-07-29 NOTE — Telephone Encounter (Signed)
°*  STAT* If patient is at the pharmacy, call can be transferred to refill team.   1. Which medications need to be refilled? (please list name of each medication and dose if known) spironolactone 12.5 mg po q d   2. Which pharmacy/location (including street and city if local pharmacy) is medication to be sent to? [facility] order fax [phone]  3. Do they need a 30 day or 90 day supply? [place]

## 2018-08-02 ENCOUNTER — Ambulatory Visit (INDEPENDENT_AMBULATORY_CARE_PROVIDER_SITE_OTHER): Payer: Medicare Other

## 2018-08-02 VITALS — BP 122/84 | HR 75 | Temp 97.5°F

## 2018-08-02 DIAGNOSIS — R7303 Prediabetes: Secondary | ICD-10-CM

## 2018-08-02 DIAGNOSIS — Z5181 Encounter for therapeutic drug level monitoring: Secondary | ICD-10-CM

## 2018-08-02 DIAGNOSIS — E785 Hyperlipidemia, unspecified: Secondary | ICD-10-CM | POA: Diagnosis not present

## 2018-08-02 DIAGNOSIS — N183 Chronic kidney disease, stage 3 unspecified: Secondary | ICD-10-CM

## 2018-08-02 DIAGNOSIS — Z953 Presence of xenogenic heart valve: Secondary | ICD-10-CM

## 2018-08-02 DIAGNOSIS — I10 Essential (primary) hypertension: Secondary | ICD-10-CM

## 2018-08-02 DIAGNOSIS — Z0000 Encounter for general adult medical examination without abnormal findings: Secondary | ICD-10-CM

## 2018-08-02 DIAGNOSIS — I4891 Unspecified atrial fibrillation: Secondary | ICD-10-CM | POA: Diagnosis not present

## 2018-08-02 DIAGNOSIS — E559 Vitamin D deficiency, unspecified: Secondary | ICD-10-CM | POA: Diagnosis not present

## 2018-08-02 LAB — RENAL FUNCTION PANEL
Albumin: 4.6 g/dL (ref 3.5–5.2)
BUN: 38 mg/dL — ABNORMAL HIGH (ref 6–23)
CO2: 26 mEq/L (ref 19–32)
Calcium: 9.8 mg/dL (ref 8.4–10.5)
Chloride: 106 mEq/L (ref 96–112)
Creatinine, Ser: 1.13 mg/dL (ref 0.40–1.20)
GFR: 46.8 mL/min — ABNORMAL LOW (ref >60.00)
GLUCOSE: 79 mg/dL (ref 70–99)
Phosphorus: 3.8 mg/dL (ref 2.3–4.6)
Potassium: 4.4 mEq/L (ref 3.5–5.1)
Sodium: 140 mEq/L (ref 135–145)

## 2018-08-02 LAB — HEMOGLOBIN A1C: Hgb A1c MFr Bld: 6.2 % (ref 4.6–6.5)

## 2018-08-02 LAB — LIPID PANEL
CHOLESTEROL: 162 mg/dL (ref 0–200)
HDL: 60.9 mg/dL (ref >39.00)
LDL Cholesterol: 92 mg/dL (ref 0–99)
NonHDL: 100.72
Total CHOL/HDL Ratio: 3
Triglycerides: 44 mg/dL (ref 0.0–149.0)
VLDL: 8.8 mg/dL (ref 0.0–40.0)

## 2018-08-02 LAB — POCT INR: INR: 2.7 (ref 2.0–3.0)

## 2018-08-02 LAB — VITAMIN D 25 HYDROXY (VIT D DEFICIENCY, FRACTURES): VITD: 23.03 ng/mL — ABNORMAL LOW (ref 30.00–100.00)

## 2018-08-02 MED ORDER — WARFARIN SODIUM 3 MG PO TABS: ORAL_TABLET | ORAL | 0 refills | Status: DC

## 2018-08-02 NOTE — Patient Instructions (Signed)
Elizabeth Hall , Thank you for taking time to come for your Medicare Wellness Visit. I appreciate your ongoing commitment to your health goals. Please review the following plan we discussed and let me know if I can assist you in the future.   These are the goals we discussed: Goals    . Follow up with Primary Care Provider     Starting 08/02/2018, I will continue to take medications as prescribed and to keep appointments with PCP as scheduled.        This is a list of the screening recommended for you and due dates:  Health Maintenance  Topic Date Due  . DTaP/Tdap/Td vaccine (1 - Tdap) 06/22/2026*  . Tetanus Vaccine  06/22/2026*  . Colon Cancer Screening  04/22/2019  . Flu Shot  Completed  . DEXA scan (bone density measurement)  Completed  . Pneumonia vaccines  Completed  *Topic was postponed. The date shown is not the original due date.   Preventive Care for Adults  A healthy lifestyle and preventive care can promote health and wellness. Preventive health guidelines for adults include the following key practices.  . A routine yearly physical is a good way to check with your health care provider about your health and preventive screening. It is a chance to share any concerns and updates on your health and to receive a thorough exam.  . Visit your dentist for a routine exam and preventive care every 6 months. Brush your teeth twice a day and floss once a day. Good oral hygiene prevents tooth decay and gum disease.  . The frequency of eye exams is based on your age, health, family medical history, use  of contact lenses, and other factors. Follow your health care provider's recommendations for frequency of eye exams.  . Eat a healthy diet. Foods like vegetables, fruits, whole grains, low-fat dairy products, and lean protein foods contain the nutrients you need without too many calories. Decrease your intake of foods high in solid fats, added sugars, and salt. Eat the right amount of calories  for you. Get information about a proper diet from your health care provider, if necessary.  . Regular physical exercise is one of the most important things you can do for your health. Most adults should get at least 150 minutes of moderate-intensity exercise (any activity that increases your heart rate and causes you to sweat) each week. In addition, most adults need muscle-strengthening exercises on 2 or more days a week.  Silver Sneakers may be a benefit available to you. To determine eligibility, you may visit the website: www.silversneakers.com or contact program at 920-151-7217 Mon-Fri between 8AM-8PM.   . Maintain a healthy weight. The body mass index (BMI) is a screening tool to identify possible weight problems. It provides an estimate of body fat based on height and weight. Your health care provider can find your BMI and can help you achieve or maintain a healthy weight.   For adults 20 years and older: ? A BMI below 18.5 is considered underweight. ? A BMI of 18.5 to 24.9 is normal. ? A BMI of 25 to 29.9 is considered overweight. ? A BMI of 30 and above is considered obese.   . Maintain normal blood lipids and cholesterol levels by exercising and minimizing your intake of saturated fat. Eat a balanced diet with plenty of fruit and vegetables. Blood tests for lipids and cholesterol should begin at age 77 and be repeated every 5 years. If your lipid or cholesterol  levels are high, you are over 50, or you are at high risk for heart disease, you may need your cholesterol levels checked more frequently. Ongoing high lipid and cholesterol levels should be treated with medicines if diet and exercise are not working.  . If you smoke, find out from your health care provider how to quit. If you do not use tobacco, please do not start.  . If you choose to drink alcohol, please do not consume more than 2 drinks per day. One drink is considered to be 12 ounces (355 mL) of beer, 5 ounces (148 mL) of  wine, or 1.5 ounces (44 mL) of liquor.  . If you are 42-19 years old, ask your health care provider if you should take aspirin to prevent strokes.  . Use sunscreen. Apply sunscreen liberally and repeatedly throughout the day. You should seek shade when your shadow is shorter than you. Protect yourself by wearing Blatter sleeves, pants, a wide-brimmed hat, and sunglasses year round, whenever you are outdoors.  . Once a month, do a whole body skin exam, using a mirror to look at the skin on your back. Tell your health care provider of new moles, moles that have irregular borders, moles that are larger than a pencil eraser, or moles that have changed in shape or color.

## 2018-08-02 NOTE — Patient Instructions (Signed)
Please continue 1 tablet every day EXCEPT 1.5 ON MONDAYS. Recheck in 5 weeks.

## 2018-08-02 NOTE — Progress Notes (Signed)
Subjective:   Elizabeth Hall is a 78 y.o. female who presents for Medicare Annual (Subsequent) preventive examination.  Review of Systems:  N/A Cardiac Risk Factors include: advanced age (>88men, >32 women);dyslipidemia;hypertension     Objective:     Vitals: BP 122/84 (BP Location: Right Arm, Patient Position: Sitting, Cuff Size: Normal)   Pulse 75   Temp (!) 97.5 F (36.4 C) (Oral)   LMP  (LMP Unknown) Comment: menopause age 89  SpO2 99%   There is no height or weight on file to calculate BMI.  Advanced Directives 08/02/2018 07/20/2017 05/15/2017 04/20/2017 12/29/2016 07/07/2016  Does Patient Have a Medical Advance Directive? Yes Yes Yes Yes Yes Yes  Type of Paramedic of [place];Living will [place];Living will Living will [place];Living will [place];Living will [place];Living will  Does patient want to make changes to medical advance directive? - - No - Patient declined No - Patient declined No - Patient declined -  Copy of [place] in Chart? Yes - validated most recent copy scanned in chart (See row information) Yes - - No - copy requested Yes  Would patient like information on creating a medical advance directive? - - No - Patient declined - - -    Tobacco Social History   Tobacco Use  Smoking Status Former Smoker  . Packs/day: 0.50  . Years: 20.00  . Pack years: 10.00  . Types: Cigarettes  . Last attempt to quit: 02/04/1990  . 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 Bleeding of eye   . Chronic systolic heart failure ([place])    a. 11/2016 Echo: EF 35-40%, mild MR w/ nl fxning bioprosthesis, mod dil LA.  . CKD (chronic kidney disease) stage 3, GFR 30-59 ml/min (HCC)    baseline Cr 1.5-2; prior saw nephrologist thought hypertensive nephropathy/renovascular disease  . CVA (cerebral infarction)    a. 2014 occipital lobe - some vision loss when coumadin held and not bridged  . Dyslipidemia   . Glaucoma   . H. pylori infection    treated  . H/O mitral valve replacement    a. 05/2012 s/p 42mm Carpentier Edwards pericardial tissue valve.  [name] Hx of rheumatic fever   . Hypertension   . Melanoma ([place]) 2006   L shoulder  . NICM (nonischemic cardiomyopathy) ([place])    a. 08/2013 s/p Biotronik Ilesto 7 HF1 BiV ICD (ser# [id number]);  b. 11/2016 Echo: EF 35-40%.  . Non-obstructive CAD    a. s/p MI 2001;  b. 12/2016 Cath: LM 20ost, OM2 60, RCA 30p/d, EF 35-45%. Nl R heart filling pressures.  . Permanent atrial fibrillation    a. 04/2012 s/p AVN & PPM placement (later upgraded to BiV ICD).  [name] Phlebitis   . 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 06/2012 h/o acute ischemia due to thromboembolism radial and ulnar arteries when coumadin held s/p atherectomy - needs bridging if off coumadin  . Tricuspid regurgitation    a. 05/2012 s/p TV annuloplasty @ time of MVR.   Past Surgical History:  Procedure Laterality Date  . ABDOMINAL HYSTERECTOMY    . ABLATION  2013  . ATHERECTOMY  06/2012   left arm after coumadin held without bridging  . BREAST BIOPSY    . CARDIOVASCULAR STRESS TEST  03/2016   WNL  . CATARACT EXTRACTION Bilateral 2007, 2010  . COLONOSCOPY  03/2014   diverticulosis, int hem, rpt 5 yrs for h/o polyps ([place])  .  defibrillator     MEDTRONIC 5086 MRI 52 CM LEAD, SERIAL # [id number]  . DEXA  07/2016   WNL  . DG ABDOMEN COMPLETE ([place] HX)  02/2013   HH, mod severe erosive gastritis, duodenitis  . GALLBLADDER SURGERY  2006  . INSERT / REPLACE / REMOVE PACEMAKER  08/2013  . MITRAL VALVE REPLACEMENT  05/2012  . RIGHT/LEFT HEART CATH AND CORONARY ANGIOGRAPHY N/A 12/29/2016   Procedure: Right/Left Heart Cath and Coronary Angiography;  Surgeon: [name], MD;  Location: [facility];  Service: Cardiovascular;  Laterality: N/A;  . TOTAL ABDOMINAL HYSTERECTOMY W/ BILATERAL SALPINGOOPHORECTOMY  1991   irregular bleeding  . TRICUSPID VALVE REPLACEMENT  05/2012  . US ECHOCARDIOGRAPHY  02/2014   EF 40%, LA marked dilation, gen LV hypokinesis, mitral prosthetic valve   Family History  Problem Relation Age of Onset  . CAD Father 53       MI  . Hypertension Brother   . Heart failure Brother        CHF with pacemaker  . Stroke Brother   . Heart failure Sister        CHF  . Stroke Sister   . Diabetes Cousin   . Cancer Maternal Aunt        colon  . Cancer Cousin        breast  . Cancer Cousin        brain tumor   Social History   Socioeconomic History  . Marital status: Married    Spouse name: Not on file  . Number of children: Not on file  . Years of education: Not on file  . Highest education level: Not on file  Occupational History  . Not on file  Social Needs  . Financial resource strain: Not on file  . Food insecurity:    Worry: Not on file    Inability: Not on file  . Transportation needs:    Medical: Not on file    Non-medical: Not on file  Tobacco Use  . Smoking status: Former Smoker    Packs/day: 0.50    Years: 20.00    Pack years: 10.00    Types: Cigarettes    Last attempt to quit: 02/04/1990    Years since quitting: 28.5  . Smokeless tobacco: Never Used  Substance and Sexual Activity  . Alcohol use: Yes    Alcohol/week: 0.0 standard drinks    Comment:  occasional  . Drug use: No  . Sexual activity: Not Currently    Birth control/protection: None  Lifestyle  . Physical activity:    Days per week: Not on file    Minutes per session: Not on file  . Stress: Not on file  Relationships  . Social connections:    Talks on phone: Not on file  Gets together: Not on file    Attends religious service: Not on file    Active member of club or organization: Not on file    Attends meetings of clubs or organizations: Not on file    Relationship status: Not on file  Other Topics Concern  . 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 acetaminophen (TYLENOL) 500 MG tablet Take 500-1,000 mg by mouth every 6 (six) hours as needed (for headaches/pain.).  [name] aspirin EC 81 MG tablet Take 81 mg by mouth daily.  . bumetanide (BUMEX) 0.5 MG tablet Take 1 tablet (0.5 mg total) by mouth daily. Continue to monitor weight, if you start gaining fluids, then increase back to 1 mg daily.  . carvedilol (COREG) 12.5 MG tablet Take 1 tablet (12.5 mg total) by mouth 2 (two) times daily with a meal.  . LUMIGAN 0.01 % SOLN   . Magnesium 250 MG TABS Take 250 mg by mouth daily.  . meclizine (ANTIVERT) 25 MG tablet Take 1 tablet (25 mg total) by mouth 2 (two) times daily as needed for dizziness.  Vladimir Faster Glycol-Propyl Glycol (LUBRICANT EYE DROPS) 0.4-0.3 % SOLN Place 1 drop into both eyes 3 (three) times daily as needed (for dry eyes.).  [name] sacubitril-valsartan (ENTRESTO) 24-26 MG Take 1 tablet 2 (two) times daily by mouth.  . spironolactone (ALDACTONE) 25 MG tablet Take 0.5 tablets (12.5 mg total) by mouth daily.  . vitamin E 400 UNIT capsule Take 400 Units by mouth daily.  . [DISCONTINUED] warfarin  (COUMADIN) 3 MG tablet Take as directed by the coumadin clinic.   No facility-administered encounter medications on file as of 08/02/2018.     Activities of Daily Living In your present state of health, do you have any difficulty performing the following activities: 08/02/2018  Hearing? N  Vision? N  Difficulty concentrating or making decisions? N  Walking or climbing stairs? N  Dressing or bathing? N  Doing errands, shopping? N  Preparing Food and eating ? N  Using the Toilet? N  In the past six months, have you accidently leaked urine? Y  Do you have problems with loss of bowel control? N  Managing your Medications? N  Managing your Finances? N  Housekeeping or managing your Housekeeping? N  Some recent data might be hidden    Patient Care Team: Ria Bush, MD as PCP - General (Family Medicine) Vin-Parikh, [name], MD as Referring Physician (Ophthalmology) [name], MD as Consulting Physician (Cardiology) [name], MD as Consulting Physician (Obstetrics and Gynecology) [name], MD as Consulting Physician (Cardiology) [name], MD as Consulting Physician (Dermatology)    Assessment:   This is a routine wellness examination for Elizabeth Hall.   Hearing Screening   125Hz  250Hz  500Hz  1000Hz  2000Hz  3000Hz  4000Hz  6000Hz  8000Hz   Right ear:   40 40 40  40    Left ear:   40 40 40  40    Vision Screening Comments: Vision exam every 3-4 mths - June 22, 2018 with Dr. George Ina   Exercise Activities and Dietary recommendations Current Exercise Habits: Home exercise routine, Type of exercise: walking, Time (Minutes): 20, Frequency (Times/Week): 3, Weekly Exercise (Minutes/Week): 60,  Intensity: Mild, Exercise limited by: None identified  Goals    . Follow up with Primary Care Provider     Starting 08/02/2018, I will continue to take medications as prescribed and to keep appointments with PCP as scheduled.        Fall Risk Fall Risk  08/02/2018  07/20/2017 04/20/2017 07/07/2016 07/02/2015  Falls in the past year? 0 Yes No No No  Comment - pt fell after stepping backwards off of a stool; seen in ER - - -  Number falls in past yr: - 1 - - -  Injury with Fall? - Yes - - -   Depression Screen PHQ 2/9 Scores 08/02/2018 07/29/2017 07/20/2017 04/20/2017  PHQ - 2 Score 0 0 0 0  PHQ- 9 Score 0 0 0 3     Cognitive Function MMSE - Mini Mental State Exam 08/02/2018 07/20/2017 07/07/2016  Orientation to time 5 5 5   Orientation to Place 5 5 5   Registration 3 3 3   Attention/ Calculation 0 0 0  Recall 3 3 3   Language- name 2 objects 0 0 0  Language- repeat 1 1 1   Language- follow 3 step command 3 3 3   Language- read & follow direction 0 0 0  Write a sentence 0 0 0  Copy design 0 0 0  Total score 20 20 20      PLEASE NOTE: A Mini-Cog screen was completed. Maximum score is 20. A value of 0 denotes this part of [place] MMSE was not completed or the patient failed this part of the Mini-Cog screening.   Mini-Cog Screening Orientation to Time - Max 5 pts Orientation to Place - Max 5 pts Registration - Max 3 pts Recall - Max 3 pts Language Repeat - Max 1 pts Language Follow 3 Step Command - Max 3 pts     Immunization History  Administered Date(s) Administered  . Influenza, High Dose Seasonal PF 03/27/2017, 03/01/2018  . Influenza,inj,Quad PF,6+ Mos 03/21/2015, 03/25/2016  . Influenza-Unspecified 03/01/2018  . Pneumococcal Conjugate-13 05/23/2014  . Pneumococcal Polysaccharide-23 07/02/2015  . Td 06/23/2006    Screening Tests Health Maintenance  Topic Date Due  . DTaP/Tdap/Td (1 - Tdap) 06/22/2026 (Originally 06/09/1952)  . TETANUS/TDAP  06/22/2026 (Originally 06/23/2016)  . COLONOSCOPY  04/22/2019  . INFLUENZA VACCINE  Completed  . DEXA SCAN  Completed  . PNA vac Low Risk Adult  Completed       Plan:     I have personally reviewed, addressed, and noted the following in the patient's chart:  A. Medical and social history B. Use  of alcohol, tobacco or illicit drugs  C. Current medications and supplements D. Functional ability and status E.  Nutritional status F.  Physical activity G. Advance directives H. List of other physicians I.  Hospitalizations, surgeries, and ER visits in previous 12 months J.  [place] to include hearing, vision, cognitive, depression L. Referrals and appointments - none  In addition, I have reviewed and discussed with patient certain preventive protocols, quality metrics, and best practice recommendations. A written personalized care plan for preventive services as well as general preventive health recommendations were provided to patient.  See attached scanned questionnaire for additional information.   Signed,   Lindell Noe, MHA, BS, LPN Health Coach

## 2018-08-02 NOTE — Progress Notes (Signed)
PCP notes:   Health maintenance:  No gaps identified.  Abnormal screenings:   None  Patient concerns:   None  Nurse concerns:  None  Next PCP appt:   08/30/18 @ 1130  I reviewed health advisor's note, was available for consultation on the day of service listed in this note, and agree with documentation and plan. Elsie Stain, MD.

## 2018-08-03 IMAGING — CT CT HEAD W/O CM
4 series · 16 of 47 positions shown, 18 images · non-contrast
Comparison: None.

CLINICAL DATA: Head injury after fall.  No loss of consciousness.

EXAM:
CT HEAD WITHOUT CONTRAST
TECHNIQUE: Contiguous axial images were obtained from the base of the skull
through the vertex without intravenous contrast.

[Series 2: head bone · axial · 0.40mm/px · z∈[-114,-86]mm · 3 of 73 slices shown]
[im 8/73  bone]
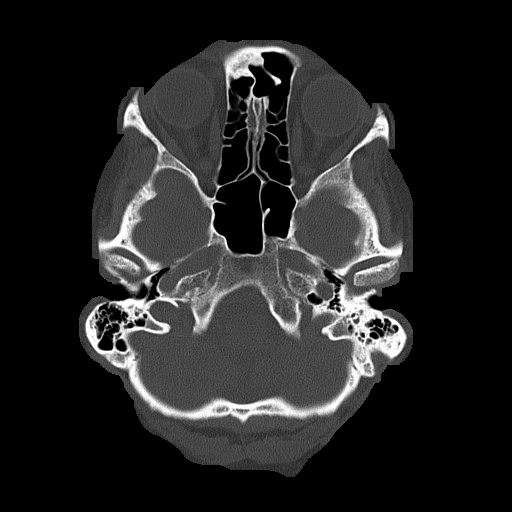
[im 15/73  bone]
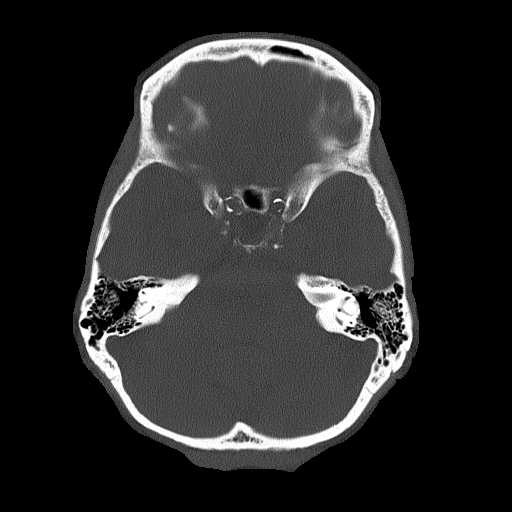
[im 22/73  bone]
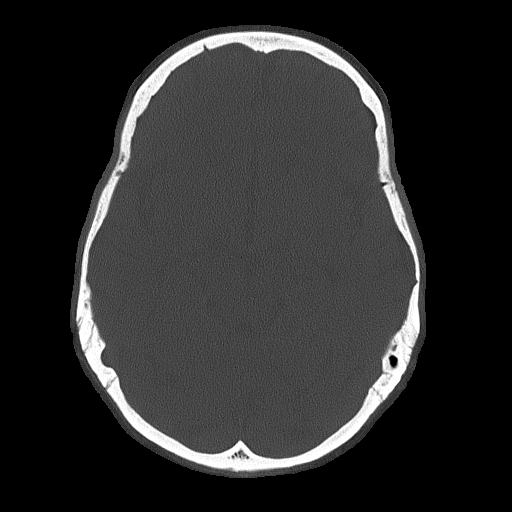

[Series 3: head wo · axial · 0.40mm/px · z∈[-113,-8]mm · 7 of 29 slices shown, 9 images]
[im 4/29  brain]
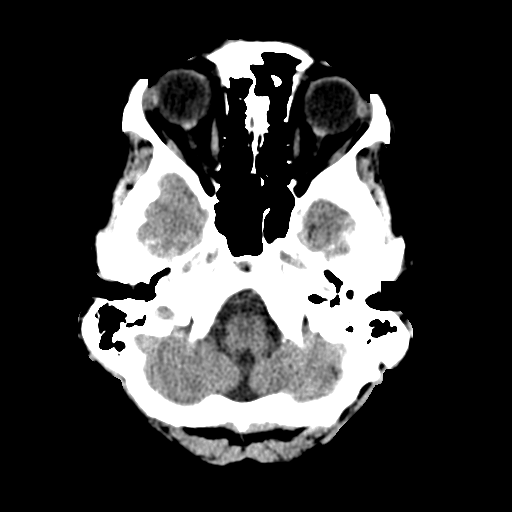
[im 4/29  bone]
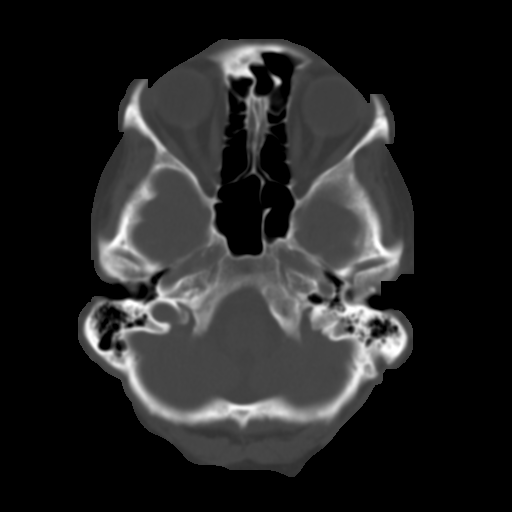
[im 8/29  brain]
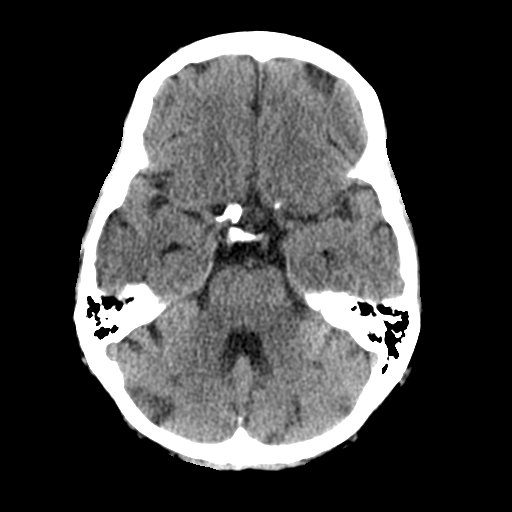
[im 11/29  brain]
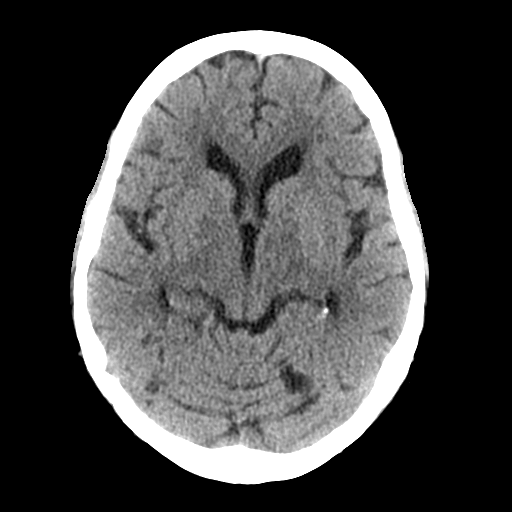
[im 15/29  brain]
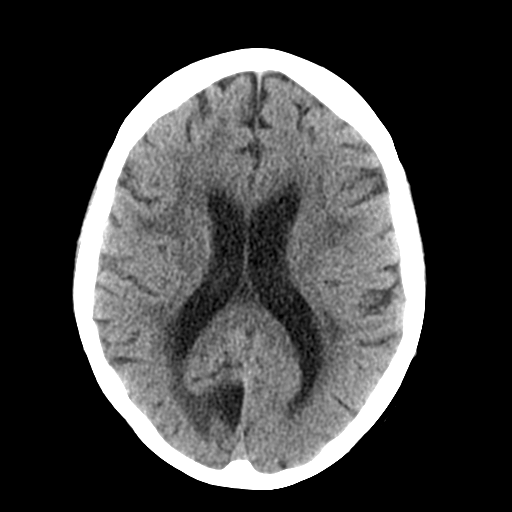
[im 18/29  brain]
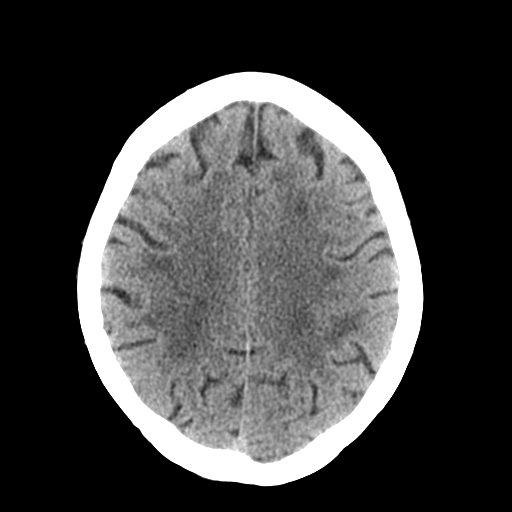
[im 18/29  bone]
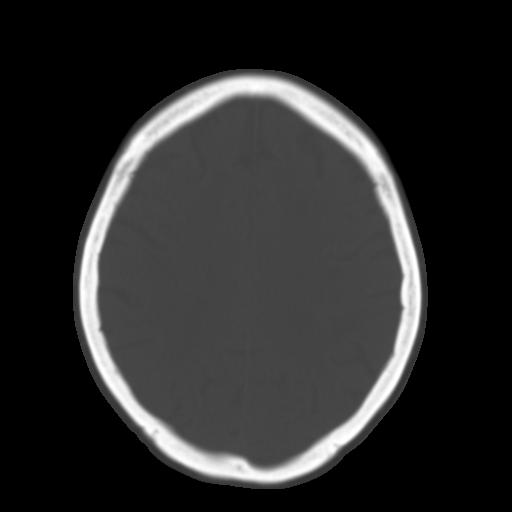
[im 22/29  brain]
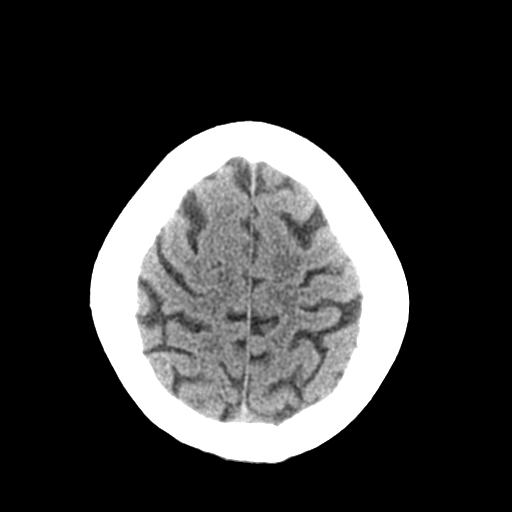
[im 25/29  brain]
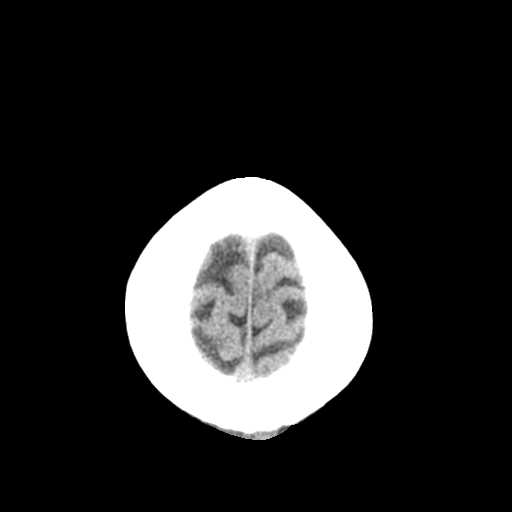

[Series 4: coronal soft tissue · coronal · 0.30mm/px · 3 of 63 slices shown]
[im 21/63  brain]
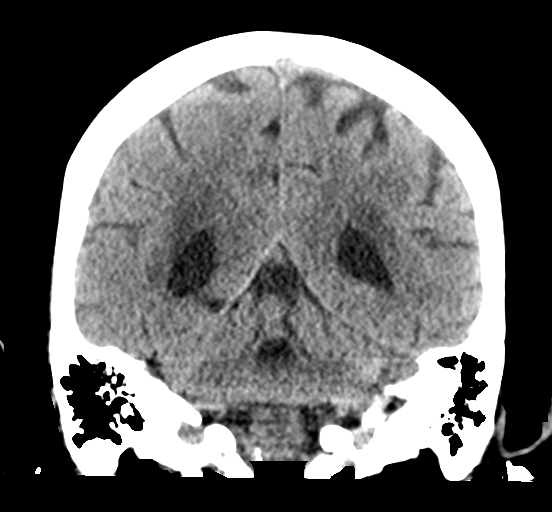
[im 28/63  brain]
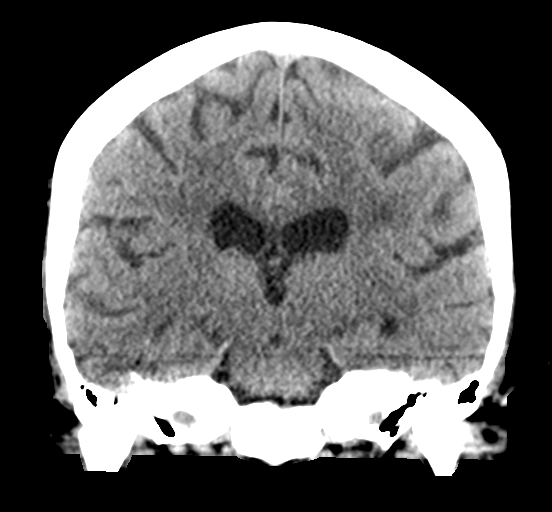
[im 35/63  brain]
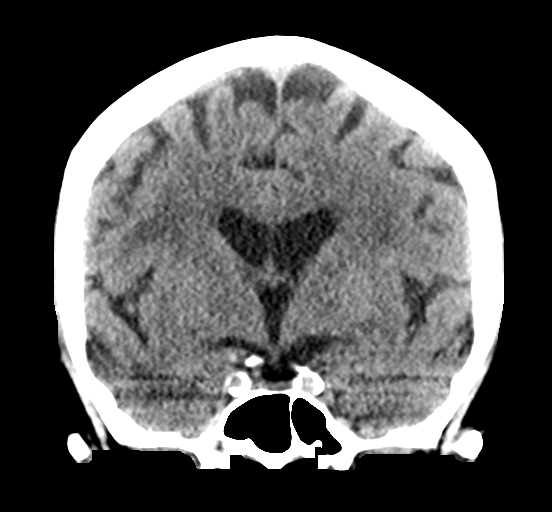

[Series 5: sagittal soft tissue · sagittal · 0.31mm/px · 3 of 52 slices shown]
[im 18/52  brain]
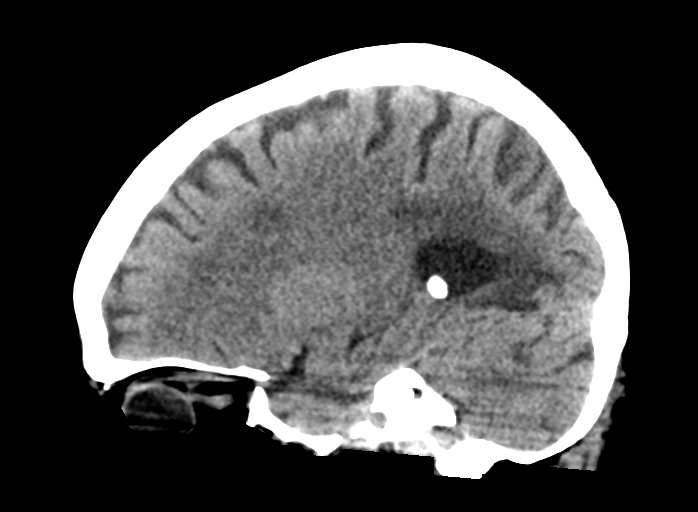
[im 26/52  brain]
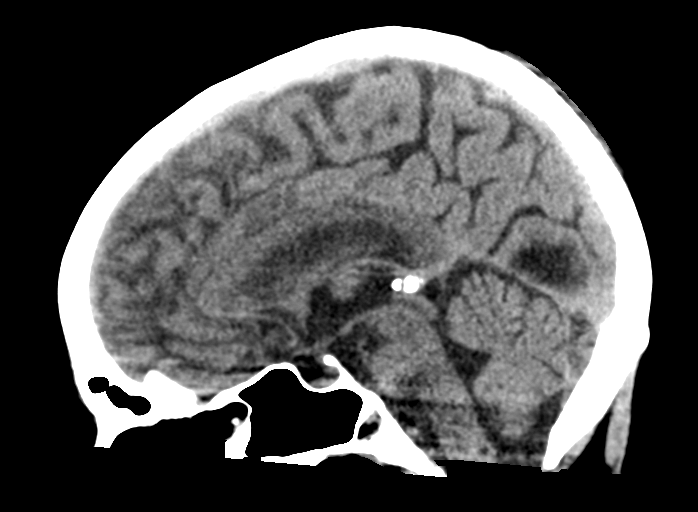
[im 35/52  brain]
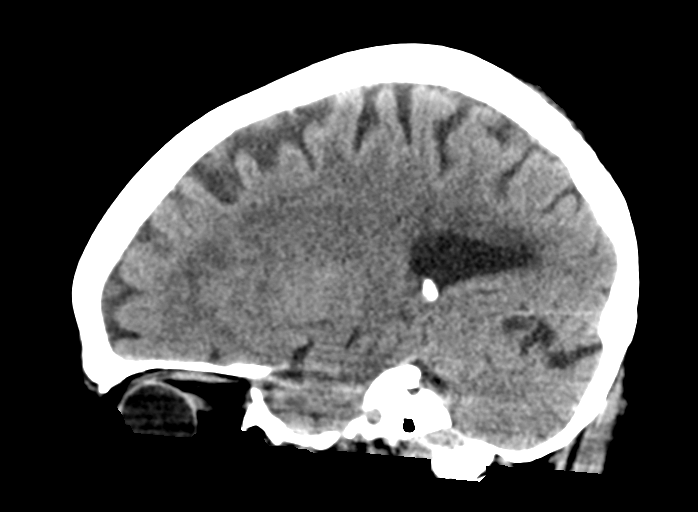

[16 of 47 positions shown; findings below may reference images not displayed]

FINDINGS: Brain: Mild chronic ischemic white matter disease is noted. Old left
cerebellar infarction is noted. Right occipital encephalomalacia is
noted consistent with old infarction. No mass effect or midline
shift is noted. Ventricular size is within normal limits. There is
no evidence of mass lesion, hemorrhage or acute infarction.

Vascular: No hyperdense vessel or unexpected calcification.

Skull: Normal. Negative for fracture or focal lesion.

Sinuses/Orbits: No acute finding.

Other: Small occipital scalp hematoma is noted.
IMPRESSION: Mild chronic ischemic white matter disease. Old right occipital and
left cerebellar infarctions. Small occipital scalp hematoma. No
acute intracranial abnormality seen.

## 2018-08-06 NOTE — Telephone Encounter (Signed)
Noticed received from the [place] that the patient has been approved through 08/02/2019

## 2018-08-09 ENCOUNTER — Encounter: Payer: Medicare Other | Admitting: Family Medicine

## 2018-08-10 ENCOUNTER — Ambulatory Visit (INDEPENDENT_AMBULATORY_CARE_PROVIDER_SITE_OTHER): Payer: Medicare Other

## 2018-08-10 DIAGNOSIS — I5022 Chronic systolic (congestive) heart failure: Secondary | ICD-10-CM

## 2018-08-10 DIAGNOSIS — I428 Other cardiomyopathies: Secondary | ICD-10-CM

## 2018-08-10 LAB — CUP PACEART REMOTE DEVICE CHECK
Date Time Interrogation Session: 20200218122228
Implantable Lead Implant Date: 20150316
Implantable Lead Implant Date: 20150316
Implantable Lead Location: 753858
Implantable Lead Location: 753860
Implantable Lead Model: 346
Implantable Pulse Generator Implant Date: 20150316
MDC IDC LEAD SERIAL: 25130583
Pulse Gen Model: 383547
Pulse Gen Serial Number: 60765179

## 2018-08-18 NOTE — Progress Notes (Signed)
Remote ICD transmission.   

## 2018-08-23 ENCOUNTER — Encounter: Payer: Self-pay | Admitting: Cardiology

## 2018-08-23 DIAGNOSIS — L812 Freckles: Secondary | ICD-10-CM | POA: Diagnosis not present

## 2018-08-23 DIAGNOSIS — Z1283 Encounter for screening for malignant neoplasm of skin: Secondary | ICD-10-CM | POA: Diagnosis not present

## 2018-08-23 DIAGNOSIS — Z85828 Personal history of other malignant neoplasm of skin: Secondary | ICD-10-CM | POA: Diagnosis not present

## 2018-08-23 DIAGNOSIS — L821 Other seborrheic keratosis: Secondary | ICD-10-CM | POA: Diagnosis not present

## 2018-08-23 DIAGNOSIS — Z85820 Personal history of malignant melanoma of skin: Secondary | ICD-10-CM | POA: Diagnosis not present

## 2018-08-23 DIAGNOSIS — D225 Melanocytic nevi of trunk: Secondary | ICD-10-CM | POA: Diagnosis not present

## 2018-08-23 DIAGNOSIS — I8392 Asymptomatic varicose veins of left lower extremity: Secondary | ICD-10-CM | POA: Diagnosis not present

## 2018-08-23 DIAGNOSIS — L570 Actinic keratosis: Secondary | ICD-10-CM | POA: Diagnosis not present

## 2018-08-26 ENCOUNTER — Telehealth: Payer: Self-pay | Admitting: Cardiovascular Disease

## 2018-08-26 ENCOUNTER — Other Ambulatory Visit: Payer: Self-pay | Admitting: *Deleted

## 2018-08-26 MED ORDER — SACUBITRIL-VALSARTAN 24-26 MG PO TABS: 1.0000 | ORAL_TABLET | Freq: Two times a day (BID) | ORAL | 1 refills | Status: DC

## 2018-08-26 NOTE — Telephone Encounter (Signed)
°*  STAT* If patient is at the pharmacy, call can be transferred to refill team.   1. Which medications need to be refilled? (please list name of each medication and dose if known)     Entresto 24-26 mg po q d   2. Which pharmacy/location (including street and city if local pharmacy) is medication to be sent to? PATIENT WANTS WRITTEN RX   3. Do they need a 30 day or 90 day supply? [place]

## 2018-08-26 NOTE — Telephone Encounter (Signed)
Requested Prescriptions   Signed Prescriptions Disp Refills  . sacubitril-valsartan (ENTRESTO) 24-26 MG 180 tablet 1    Sig: Take 1 tablet by mouth 2 (two) times daily.    Authorizing Provider: Kathlyn Sacramento A    Ordering User: Britt Bottom

## 2018-08-26 NOTE — Telephone Encounter (Signed)
Requested Prescriptions   Signed Prescriptions Disp Refills  . sacubitril-valsartan (ENTRESTO) 24-26 MG 180 tablet 1    Sig: Take 1 tablet by mouth 2 (two) times daily.    Authorizing Provider: Kathlyn Sacramento A    Ordering User: Othelia Pulling C   Rx placed in Dr. Fletcher Anon in-basket for signature.

## 2018-08-30 ENCOUNTER — Encounter: Payer: Medicare Other | Admitting: Family Medicine

## 2018-08-30 NOTE — Telephone Encounter (Signed)
Please call [employer] for   [phone]  Pt c/o medication issue:  1. Name of Medication: Entresto   2. How are you currently taking this medication (dosage and times per day)? 24-26 mg po q d   3. Are you having a reaction (difficulty breathing--STAT)?  No   4. What is your medication issue? [employer] wants a call to clarify dosage please call [phone]

## 2018-08-31 NOTE — Telephone Encounter (Signed)
I spoke with [place] from [employer] to clarify Entresto 24-26 mg tablet. Pt takes 1 tablet by mouth BID.  They will go head and process pt's medication.

## 2018-08-31 NOTE — Telephone Encounter (Signed)
Patient calling  States that [employer] has asked for a medication clarification for Entresto prescription Please call [phone] to clarify - patient will be out of medication this week

## 2018-09-06 ENCOUNTER — Other Ambulatory Visit: Payer: Self-pay

## 2018-09-06 ENCOUNTER — Ambulatory Visit (INDEPENDENT_AMBULATORY_CARE_PROVIDER_SITE_OTHER): Payer: Medicare Other

## 2018-09-06 DIAGNOSIS — Z5181 Encounter for therapeutic drug level monitoring: Secondary | ICD-10-CM | POA: Diagnosis not present

## 2018-09-06 DIAGNOSIS — Z953 Presence of xenogenic heart valve: Secondary | ICD-10-CM

## 2018-09-06 DIAGNOSIS — I4891 Unspecified atrial fibrillation: Secondary | ICD-10-CM

## 2018-09-06 LAB — POCT INR: INR: 2.7 (ref 2.0–3.0)

## 2018-09-06 NOTE — Patient Instructions (Signed)
Please continue 1 tablet every day EXCEPT 1.5 ON MONDAYS. Recheck in 6 weeks.

## 2018-09-10 ENCOUNTER — Telehealth: Payer: Self-pay

## 2018-09-10 NOTE — Telephone Encounter (Signed)

## 2018-09-14 ENCOUNTER — Ambulatory Visit: Payer: Medicare Other | Admitting: Cardiovascular Disease

## 2018-09-23 ENCOUNTER — Telehealth: Payer: Self-pay | Admitting: Cardiovascular Disease

## 2018-09-23 NOTE — Telephone Encounter (Signed)
Virtual Visit Pre-Appointment Phone Call  Steps For Call:  1. Confirm consent - "In the setting of the current Covid19 crisis, you are scheduled for a (phone or video) visit with your provider on (date) at (time).  Just as we do with many in-office visits, in order for you to participate in this visit, we must obtain consent.  If you'd like, I can send this to your mychart (if signed up) or email for you to review.  Otherwise, I can obtain your verbal consent now.  All virtual visits are billed to your insurance company just like a normal visit would be.  By agreeing to a virtual visit, we'd like you to understand that the technology does not allow for your provider to perform an examination, and thus may limit your provider's ability to fully assess your condition.  Finally, though the technology is pretty good, we cannot assure that it will always work on either your or our end, and in the setting of a video visit, we may have to convert it to a phone-only visit.  In either situation, we cannot ensure that we have a secure connection.  Are you willing to proceed?"  2. Give patient instructions for WebEx download to smartphone as below if video visit  3. Advise patient to be prepared with any vital sign or heart rhythm information, their current medicines, and a piece of paper and pen handy for any instructions they may receive the day of their visit  4. Inform patient they will receive a phone call 15 minutes prior to their appointment time (may be from unknown caller ID) so they should be prepared to answer  5. Confirm that appointment type is correct in Epic appointment notes (video vs telephone)    TELEPHONE CALL NOTE  Elizabeth Hall has been deemed a candidate for a follow-up tele-health visit to limit community exposure during the Covid-19 pandemic. I spoke with the patient via phone to ensure availability of phone/video source, confirm preferred email & phone number, and discuss  instructions and expectations.  I reminded Elizabeth Hall to be prepared with any vital sign and/or heart rhythm information that could potentially be obtained via home monitoring, at the time of her visit. I reminded Elizabeth Hall to expect a phone call at the time of her visit if her visit.  Did the patient verbally acknowledge consent to treatment? Yes   Ace Gins 09/23/2018 3:43 PM   DOWNLOADING THE [place], go to [employer] and type in WebEx in the search bar. [place] [employer], the blue/green circle. The app is free but as with any other app downloads, their phone may require them to verify saved payment information or Apple password. The patient does NOT have to create an account.  - If Android, ask patient to go to [employer] and type in WebEx in the search bar. [place] [employer], the blue/green circle. The app is free but as with any other app downloads, their phone may require them to verify saved payment information or Android password. The patient does NOT have to create an account.   CONSENT FOR TELE-HEALTH VISIT - PLEASE REVIEW  I hereby voluntarily request, consent and authorize [facility] and its employed or contracted physicians, physician assistants, nurse practitioners or other licensed health care professionals (the Practitioner), to provide me with telemedicine health care services (the Services") as deemed necessary by the treating Practitioner. I  acknowledge and consent to receive the Services by the Practitioner via telemedicine. I understand that the telemedicine visit will involve communicating with the Practitioner through live audiovisual communication technology and the disclosure of certain medical information by electronic transmission. I acknowledge that I have been given the opportunity to request an in-person assessment or other available alternative prior to the telemedicine visit  and am voluntarily participating in the telemedicine visit.  I understand that I have the right to withhold or withdraw my consent to the use of telemedicine in the course of my care at any time, without affecting my right to future care or treatment, and that the Practitioner or I may terminate the telemedicine visit at any time. I understand that I have the right to inspect all information obtained and/or recorded in the course of the telemedicine visit and may receive copies of available information for a reasonable fee.  I understand that some of the potential risks of receiving the Services via telemedicine include:   Delay or interruption in medical evaluation due to technological equipment failure or disruption;  Information transmitted may not be sufficient (e.g. poor resolution of images) to allow for appropriate medical decision making by the Practitioner; and/or   In rare instances, security protocols could fail, causing a breach of personal health information.  Furthermore, I acknowledge that it is my responsibility to provide information about my medical history, conditions and care that is complete and accurate to the best of my ability. I acknowledge that Practitioner's advice, recommendations, and/or decision may be based on factors not within their control, such as incomplete or inaccurate data provided by me or distortions of diagnostic images or specimens that may result from electronic transmissions. I understand that the practice of medicine is not an exact science and that Practitioner makes no warranties or guarantees regarding treatment outcomes. I acknowledge that I will receive a copy of this consent concurrently upon execution via email to the email address I last provided but may also request a printed copy by calling the office of [facility].    I understand that my insurance will be billed for this visit.   I have read or had this consent read to me.  I understand  the contents of this consent, which adequately explains the benefits and risks of the Services being provided via telemedicine.   I have been provided ample opportunity to ask questions regarding this consent and the Services and have had my questions answered to my satisfaction.  I give my informed consent for the services to be provided through the use of telemedicine in my medical care  By participating in this telemedicine visit I agree to the above.

## 2018-09-23 NOTE — Telephone Encounter (Signed)
Patient scheduled 4/9 with Dr Fletcher Anon

## 2018-09-23 NOTE — Telephone Encounter (Signed)
Spoke with patient.  She would like to to schedule a virtual webcam visit with Dr. Fletcher Anon.  Will send to scheduling pool and remove from COVID-19 pool.

## 2018-09-30 ENCOUNTER — Telehealth: Payer: Self-pay | Admitting: Cardiovascular Disease

## 2018-09-30 ENCOUNTER — Encounter: Payer: Self-pay | Admitting: Cardiovascular Disease

## 2018-09-30 ENCOUNTER — Telehealth (INDEPENDENT_AMBULATORY_CARE_PROVIDER_SITE_OTHER): Payer: Medicare Other | Admitting: Cardiovascular Disease

## 2018-09-30 ENCOUNTER — Encounter: Payer: Self-pay | Admitting: *Deleted

## 2018-09-30 ENCOUNTER — Other Ambulatory Visit: Payer: Self-pay

## 2018-09-30 VITALS — BP 105/66 | HR 75 | Ht 63.0 in | Wt 131.0 lb

## 2018-09-30 DIAGNOSIS — I5022 Chronic systolic (congestive) heart failure: Secondary | ICD-10-CM

## 2018-09-30 DIAGNOSIS — I482 Chronic atrial fibrillation, unspecified: Secondary | ICD-10-CM

## 2018-09-30 NOTE — Progress Notes (Signed)
Virtual Visit via Video Note   This visit type was conducted due to national recommendations for restrictions regarding the COVID-19 Pandemic (e.g. social distancing) in an effort to limit this patient's exposure and mitigate transmission in our community.  Due to her co-morbid illnesses, this patient is at least at moderate risk for complications without adequate follow up.  This format is felt to be most appropriate for this patient at this time.  All issues noted in this document were discussed and addressed.  A limited physical exam was performed with this format.  Please refer to the patient's chart for her consent to telehealth for [facility].   Evaluation Performed:  Follow-up visit  Date:  09/30/2018   ID:  Elizabeth Hall, Elizabeth Hall [birth year], MRN [id number]  Patient Location: Home  Provider Location: Office  PCP:  Ria Bush, MD  Cardiologist:  Kathlyn Sacramento, MD  Electrophysiologist:  None   Chief Complaint:   History of Present Illness:    Elizabeth Hall is a 78 y.o. female who presents via audio/video conferencing for a telehealth visit today.     female who presents for  a follow-up visit. 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 Congestive heart failure was diagnosed in 2007.  She has known history of atrial fibrillation. She underwent AV nodal ablation and pacemaker placement in November 2013. 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 Her ejection fraction deteriorated and she underwent explantation of the previous pacemaker and implantation of biventricular ICD in March 2015. She had CVA in the past when her warfarin was held for a procedure. Since then, she has been bridged with low molecular weight heparin.  Echocardiogram in 12/2015 showed an ejection fraction  of 59-16%, grade 2 diastolic dysfunction, replaced mitral valve with moderate regurgitation and a mean gradient of 5 mmHg. Left atrium was severely dilated. Pulmonary pressure could not be estimated. ABI was normal in 02/2016.  She had worsening symptoms of heart failure in 2018. A right and left cardiac catheterization in July of 2018 showed mild nonobstructive coronary artery disease.  Ejection fraction was 40%.  Right heart catheterization showed normal LVEDP, normal cardiac output and no significant pulmonary hypertension.  There was no evidence of left-to-right intra-atrial shunt.  There was mildly increased gradient across the mitral valve prosthesis with a mean gradient of 8 mmHg with a valve area of 1.48.  She improved significantly with medical therapy with improvement in lifestyle.   During last visit, she complained of palpitations.  I did a 3-day outpatient monitor which showed occasional PVCs as well as intermittent tachycardia.  She was seen by Dr. Caryl Comes and device interrogation confirmed no atrial lead.  The device was reprogrammed. She has been doing extremely well since then with no palpitations.  No chest pain or shortness of breath.  The patient does not have symptoms concerning for COVID-19 infection (fever, chills, cough, or new shortness of breath).    Past Medical History:  Diagnosis Date  . Bleeding of eye   . Chronic systolic heart failure ([place])    a. 11/2016 Echo: EF 35-40%, mild MR w/ nl fxning bioprosthesis, mod dil LA.  . CKD (chronic kidney disease) stage 3, GFR 30-59 ml/min (HCC)    baseline Cr 1.5-2; prior saw nephrologist thought hypertensive nephropathy/renovascular disease  . CVA (cerebral infarction)  a. 2014 occipital lobe - some vision loss when coumadin held and not bridged  . Dyslipidemia   . Glaucoma   . H. pylori infection    treated  . H/O mitral valve replacement    a. 05/2012 s/p 35mm Carpentier Edwards pericardial tissue valve.  [name] Hx of  rheumatic fever   . Hypertension   . Melanoma ([place]) 2006   L shoulder  . NICM (nonischemic cardiomyopathy) ([place])    a. 08/2013 s/p Biotronik Ilesto 7 HF1 BiV ICD (ser# [id number]);  b. 11/2016 Echo: EF 35-40%.  . Non-obstructive CAD    a. s/p MI 2001;  b. 12/2016 Cath: LM 20ost, OM2 60, RCA 30p/d, EF 35-45%. Nl R heart filling pressures.  . Permanent atrial fibrillation    a. 04/2012 s/p AVN & PPM placement (later upgraded to BiV ICD).  [name] Phlebitis   . 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 06/2012 h/o acute ischemia due to thromboembolism radial and ulnar arteries when coumadin held s/p atherectomy - needs bridging if off coumadin  . Tricuspid regurgitation    a. 05/2012 s/p TV annuloplasty @ time of MVR.   Past Surgical History:  Procedure Laterality Date  . ABDOMINAL HYSTERECTOMY    . ABLATION  2013  . ATHERECTOMY  06/2012   left arm after coumadin held without bridging  . BREAST BIOPSY    . CARDIOVASCULAR STRESS TEST  03/2016   WNL  . CATARACT EXTRACTION Bilateral 2007, 2010  . COLONOSCOPY  03/2014   diverticulosis, int hem, rpt 5 yrs for h/o polyps ([place])  . defibrillator     MEDTRONIC 5086 MRI 52 CM LEAD, SERIAL # [id number]  . DEXA  07/2016   WNL  . DG ABDOMEN COMPLETE ([place] HX)  02/2013   HH, mod severe erosive gastritis, duodenitis  . GALLBLADDER SURGERY  2006  . INSERT / REPLACE / REMOVE PACEMAKER  08/2013  . MITRAL VALVE REPLACEMENT  05/2012  . RIGHT/LEFT HEART CATH AND CORONARY ANGIOGRAPHY N/A 12/29/2016   Procedure: Right/Left Heart Cath and Coronary Angiography;  Surgeon: [name], MD;  Location: [facility];  Service: Cardiovascular;  Laterality: N/A;  . TOTAL ABDOMINAL HYSTERECTOMY W/ BILATERAL SALPINGOOPHORECTOMY  1991   irregular bleeding  . TRICUSPID VALVE REPLACEMENT  05/2012  . US ECHOCARDIOGRAPHY  02/2014   EF 40%, LA marked dilation, gen LV hypokinesis, mitral prosthetic valve     Current Meds  Medication Sig  . acetaminophen  (TYLENOL) 500 MG tablet Take 500-1,000 mg by mouth every 6 (six) hours as needed (for headaches/pain.).  [name] aspirin EC 81 MG tablet Take 81 mg by mouth daily.  . bumetanide (BUMEX) 0.5 MG tablet Take 1 tablet (0.5 mg total) by mouth daily. Continue to monitor weight, if you start gaining fluids, then increase back to 1 mg daily.  . carvedilol (COREG) 12.5 MG tablet Take 1 tablet (12.5 mg total) by mouth 2 (two) times daily with a meal.  . LUMIGAN 0.01 % SOLN   . Magnesium 250 MG TABS Take 250 mg by mouth daily.  . meclizine (ANTIVERT) 25 MG tablet Take 1 tablet (25 mg total) by mouth 2 (two) times daily as needed for dizziness.  Vladimir Faster Glycol-Propyl Glycol (LUBRICANT EYE DROPS) 0.4-0.3 % SOLN Place 1 drop into both eyes 3 (three) times daily as needed (for dry eyes.).  [name] sacubitril-valsartan (ENTRESTO) 24-26 MG Take 1 tablet by mouth 2 (two) times daily.  [name] spironolactone (ALDACTONE) 25 MG  tablet Take 0.5 tablets (12.5 mg total) by mouth daily.  . vitamin E 400 UNIT capsule Take 400 Units by mouth daily.  [name] warfarin (COUMADIN) 3 MG tablet Take as directed by the coumadin clinic.     Allergies:   Phytonadione; Amiodarone; Ciprofloxacin; Clarithromycin; Lisinopril; Metronidazole; and Phytonadione [vitamin k1]   Social History   Tobacco Use  . Smoking status: Former Smoker    Packs/day: 0.50    Years: 20.00    Pack years: 10.00    Types: Cigarettes    Last attempt to quit: 02/04/1990    Years since quitting: 28.6  . Smokeless tobacco: Never Used  Substance Use Topics  . Alcohol use: Yes    Alcohol/week: 0.0 standard drinks    Comment: occasional  . Drug use: No     Family Hx: The patient's family history includes CAD (age of onset: 64) in her father; Cancer in her cousin, cousin, and maternal aunt; Diabetes in her cousin; Heart failure in her brother and sister; Hypertension in her brother; Stroke in her brother and sister.  ROS:   Please see the history of present illness.      All other systems reviewed and are negative.   Prior CV studies:   The following studies were reviewed today:    Labs/Other Tests and Data Reviewed:    EKG:  No ECG reviewed.  Recent Labs: 03/25/2018: Hemoglobin 12.5; Magnesium 2.4; Platelets 250; TSH 1.290 08/02/2018: BUN 38; Creatinine, Ser 1.13; Potassium 4.4; Sodium 140   Recent Lipid Panel Lab Results  Component Value Date/Time   CHOL 162 08/02/2018 09:44 AM   CHOL 192 06/12/2014   TRIG 44.0 08/02/2018 09:44 AM   TRIG 70 06/12/2014   HDL 60.90 08/02/2018 09:44 AM   CHOLHDL 3 08/02/2018 09:44 AM   LDLCALC 92 08/02/2018 09:44 AM   LDLCALC 104 06/12/2014    Wt Readings from Last 3 Encounters:  09/30/18 131 lb (59.4 kg)  04/15/18 137 lb 8 oz (62.4 kg)  03/25/18 130 lb 4 oz (59.1 kg)     Objective:    Vital Signs:  BP 105/66   Pulse 75   Ht 5\' 3"  (1.6 m)   Wt 131 lb (59.4 kg)   LMP  (LMP Unknown) Comment: menopause age 27  BMI 23.21 kg/m    Well nourished, well developed female in no acute distress.   ASSESSMENT & PLAN:    1.  Chronic systolic heart failure:  Most recent ejection fraction was 35-40%.    She appears to be euvolemic and currently on optimal medical therapy.  She is currently on Bumex 0.5 mg daily.    I reviewed her labs in February which showed stable renal function with the exception of mildly elevated BUN.  I advised her to increase her fluid intake.  2. Chronic atrial fibrillation:  Palpitations resolved after her pacemaker was suggested by Dr. Caryl Comes.  Continue anticoagulation with warfarin.   3. Essential hypertension: Blood pressure is well controlled on current medications.  4. Status post mitral valve replacement with bioprosthetic valve: Acceptable function on most recent cardiac catheterization.  Mildly increased gradient as expected.   COVID-19 Education: The signs and symptoms of COVID-19 were discussed with the patient and how to seek care for testing (follow up with PCP or  arrange E-visit).  The importance of social distancing was discussed today.  Time:   Today, I have spent 22 minutes with the patient with telehealth technology discussing the above problems.  Medication Adjustments/Labs and Tests Ordered: Current medicines are reviewed at length with the patient today.  Concerns regarding medicines are outlined above.  Tests Ordered: No orders of the defined types were placed in this encounter.  Medication Changes: No orders of the defined types were placed in this encounter.   Disposition:  Follow up in 6 month(s)  Signed, Kathlyn Sacramento, MD  09/30/2018 10:58 AM    [facility]

## 2018-09-30 NOTE — Telephone Encounter (Signed)
Pt needs a letter stating she is high risk and is unable to work(per pt virtual visit this morning)

## 2018-09-30 NOTE — Telephone Encounter (Signed)
The patient has been called and made aware that the letter will be mailed for her. She has verbalized her understanding.

## 2018-09-30 NOTE — Patient Instructions (Signed)
Medication Instructions:  Continue same medications If you need a refill on your cardiac medications before your next appointment, please call your pharmacy.   Lab work: None If you have labs (blood work) drawn today and your tests are completely normal, you will receive your results only by: . MyChart Message (if you have MyChart) OR . A paper copy in the mail If you have any lab test that is abnormal or we need to change your treatment, we will call you to review the results.  Testing/Procedures: None  Follow-Up: At [facility], you and your health needs are our priority.  As part of our continuing mission to provide you with exceptional heart care, we have created designated Provider Care Teams.  These Care Teams include your primary Cardiologist (physician) and Advanced Practice Providers (APPs -  Physician Assistants and Nurse Practitioners) who all work together to provide you with the care you need, when you need it. You will need a follow up appointment in 6 months.  Please call our office 2 months in advance to schedule this appointment.  You may see Bertrand Vowels, MD or one of the following Advanced Practice Providers on your designated Care Team:   Christopher Berge, NP Ryan Dunn, PA-C . Jacquelyn Visser, PA-C   

## 2018-10-05 ENCOUNTER — Encounter: Payer: Medicare Other | Admitting: Family Medicine

## 2018-10-08 ENCOUNTER — Other Ambulatory Visit: Payer: Self-pay

## 2018-10-08 ENCOUNTER — Telehealth: Payer: Self-pay | Admitting: *Deleted

## 2018-10-08 MED ORDER — SACUBITRIL-VALSARTAN 24-26 MG PO TABS: 1.0000 | ORAL_TABLET | Freq: Two times a day (BID) | ORAL | 2 refills | Status: DC

## 2018-10-08 NOTE — Telephone Encounter (Signed)
Received fax letter from [employer] patient [employer] that patient is approved to receive ENTRESTO until 10/08/2019 at no out-of-pocket cost as [place] as all program criteria continue to be met. Letter placed in filing cabinet.

## 2018-10-08 NOTE — Telephone Encounter (Signed)
*  STAT* If patient is at the pharmacy, call can be transferred to refill team.   1. Which medications need to be refilled? (please list name of each medication and dose if known) Entresto  2. Which pharmacy/location (including street and city if local pharmacy) is medication to be sent to? [facility]  3. Do they need a 30 day or 90 day supply? [place]

## 2018-10-15 ENCOUNTER — Telehealth: Payer: Self-pay

## 2018-10-15 NOTE — Telephone Encounter (Signed)

## 2018-10-18 ENCOUNTER — Telehealth: Payer: Self-pay | Admitting: Cardiovascular Disease

## 2018-10-18 ENCOUNTER — Other Ambulatory Visit: Payer: Self-pay

## 2018-10-18 ENCOUNTER — Ambulatory Visit (INDEPENDENT_AMBULATORY_CARE_PROVIDER_SITE_OTHER): Payer: Medicare Other

## 2018-10-18 DIAGNOSIS — I4891 Unspecified atrial fibrillation: Secondary | ICD-10-CM | POA: Diagnosis not present

## 2018-10-18 DIAGNOSIS — Z5181 Encounter for therapeutic drug level monitoring: Secondary | ICD-10-CM

## 2018-10-18 DIAGNOSIS — Z953 Presence of xenogenic heart valve: Secondary | ICD-10-CM

## 2018-10-18 LAB — POCT INR: INR: 2.5 (ref 2.0–3.0)

## 2018-10-18 MED ORDER — WARFARIN SODIUM 3 MG PO TABS: ORAL_TABLET | ORAL | 1 refills | Status: DC

## 2018-10-18 NOTE — Patient Instructions (Signed)
Please continue 1 tablet every day EXCEPT 1.5 ON MONDAYS. Recheck in 8 weeks.

## 2018-10-18 NOTE — Telephone Encounter (Signed)
Pt is calling back with her mail in rx information [phone] fax is [phone]

## 2018-10-18 NOTE — Telephone Encounter (Signed)
At INR drive thru this am, pt handed me a slip of paper w/ refill info on it: [facility] mail service, [phone], but cannot find this in our electronic system. After entering fax # of [phone], [facility] mail services comes up. Spoke w/ pt to confirm this is where she would like rx sent to. She verbalizes that this is the right pharmacy, that [facility] is listed on their phone recording. Advised her that I am sending her warfarin refill as requested.

## 2018-10-25 DIAGNOSIS — H401233 Low-tension glaucoma, bilateral, severe stage: Secondary | ICD-10-CM | POA: Diagnosis not present

## 2018-10-26 ENCOUNTER — Other Ambulatory Visit: Payer: Self-pay

## 2018-10-26 MED ORDER — BUMETANIDE 0.5 MG PO TABS: 0.5000 mg | ORAL_TABLET | Freq: Every day | ORAL | 2 refills | Status: DC

## 2018-11-01 ENCOUNTER — Telehealth: Payer: Self-pay | Admitting: *Deleted

## 2018-11-01 MED ORDER — SPIRONOLACTONE 25 MG PO TABS: 12.5000 mg | ORAL_TABLET | Freq: Every day | ORAL | 2 refills | Status: DC

## 2018-11-01 NOTE — Telephone Encounter (Signed)
Was on the phone with patient talking about her husbands refills. Patient is also a patient of our office and she needed refill for Spironolactone sent to [facility] as well. REfill sent.

## 2018-11-09 ENCOUNTER — Ambulatory Visit (INDEPENDENT_AMBULATORY_CARE_PROVIDER_SITE_OTHER): Payer: Medicare Other | Admitting: *Deleted

## 2018-11-09 ENCOUNTER — Other Ambulatory Visit: Payer: Self-pay

## 2018-11-09 DIAGNOSIS — I5022 Chronic systolic (congestive) heart failure: Secondary | ICD-10-CM

## 2018-11-09 DIAGNOSIS — I428 Other cardiomyopathies: Secondary | ICD-10-CM | POA: Diagnosis not present

## 2018-11-10 DIAGNOSIS — Z01419 Encounter for gynecological examination (general) (routine) without abnormal findings: Secondary | ICD-10-CM | POA: Diagnosis not present

## 2018-11-10 DIAGNOSIS — Z6823 Body mass index (BMI) 23.0-23.9, adult: Secondary | ICD-10-CM | POA: Diagnosis not present

## 2018-11-10 DIAGNOSIS — Z1231 Encounter for screening mammogram for malignant neoplasm of breast: Secondary | ICD-10-CM | POA: Diagnosis not present

## 2018-11-10 LAB — CUP PACEART REMOTE DEVICE CHECK
Date Time Interrogation Session: 20200520082658
Implantable Lead Implant Date: 20150316
Implantable Lead Implant Date: 20150316
Implantable Lead Location: 753858
Implantable Lead Location: 753860
Implantable Lead Model: 346
Implantable Lead Serial Number: 25130583
Implantable Pulse Generator Implant Date: 20150316
Pulse Gen Model: 383547
Pulse Gen Serial Number: 60765179

## 2018-11-10 LAB — HM MAMMOGRAPHY: HM Mammogram: NORMAL (ref 0–4)

## 2018-11-18 ENCOUNTER — Encounter: Payer: Self-pay | Admitting: Cardiology

## 2018-11-18 NOTE — Progress Notes (Signed)
Remote ICD transmission.   

## 2018-11-24 ENCOUNTER — Encounter: Payer: Self-pay | Admitting: Family Medicine

## 2018-11-24 ENCOUNTER — Ambulatory Visit (INDEPENDENT_AMBULATORY_CARE_PROVIDER_SITE_OTHER): Payer: Medicare Other | Admitting: Family Medicine

## 2018-11-24 VITALS — BP 101/69 | HR 75 | Temp 96.7°F | Ht 64.0 in | Wt 130.5 lb

## 2018-11-24 DIAGNOSIS — Z8673 Personal history of transient ischemic attack (TIA), and cerebral infarction without residual deficits: Secondary | ICD-10-CM

## 2018-11-24 DIAGNOSIS — Z950 Presence of cardiac pacemaker: Secondary | ICD-10-CM

## 2018-11-24 DIAGNOSIS — H409 Unspecified glaucoma: Secondary | ICD-10-CM | POA: Diagnosis not present

## 2018-11-24 DIAGNOSIS — Z953 Presence of xenogenic heart valve: Secondary | ICD-10-CM

## 2018-11-24 DIAGNOSIS — I5022 Chronic systolic (congestive) heart failure: Secondary | ICD-10-CM | POA: Diagnosis not present

## 2018-11-24 DIAGNOSIS — E785 Hyperlipidemia, unspecified: Secondary | ICD-10-CM

## 2018-11-24 DIAGNOSIS — N183 Chronic kidney disease, stage 3 unspecified: Secondary | ICD-10-CM

## 2018-11-24 DIAGNOSIS — R7303 Prediabetes: Secondary | ICD-10-CM | POA: Diagnosis not present

## 2018-11-24 DIAGNOSIS — Z1211 Encounter for screening for malignant neoplasm of colon: Secondary | ICD-10-CM | POA: Diagnosis not present

## 2018-11-24 DIAGNOSIS — Z85820 Personal history of malignant melanoma of skin: Secondary | ICD-10-CM

## 2018-11-24 DIAGNOSIS — Z79890 Hormone replacement therapy (postmenopausal): Secondary | ICD-10-CM

## 2018-11-24 MED ORDER — VITAMIN D 50 MCG (2000 UT) PO CAPS: 1.0000 | ORAL_CAPSULE | Freq: Every day | ORAL | Status: AC

## 2018-11-24 NOTE — Assessment & Plan Note (Signed)
Has tapered off hormone replacement.

## 2018-11-24 NOTE — Progress Notes (Signed)
Virtual visit completed through [place]. Due to national recommendations of social distancing due to COVID-19, a virtual visit is felt to be most appropriate for this patient at this time. Reviewed limitations of a virtual visit.   Patient location: home, husband also present on call Provider location: [place] at [facility], office If any vitals were documented, they were collected by patient at home unless specified below.    BP 101/69   Pulse 75   Temp (!) 96.7 F (35.9 C) (Oral)   Ht 5\' 4"  (1.626 m)   Wt 130 lb 8 oz (59.2 kg)   LMP  (LMP Unknown) Comment: menopause age 76  SpO2 97%   BMI 22.40 kg/m    CC: AMW f/u visit  Subjective:    Patient ID: Elizabeth Hall, female    DOB: 09-30-1940, 78 y.o.   MRN: [id number]  HPI: Elizabeth Hall is a 78 y.o. female presenting on 11/24/2018 for Annual Exam (Pt 2. )   Clarnce Flock [name] 07/2018 for medicare wellness visit. Note reviewed.   Overall feels well.   Completed pulm rehab for chronic exertional dyspnea, saw cards/EP and pulm. Known NICM, sCHF, permanent afib and complete heart block s/p AV ablation, ICD in place. H/o CVA after warfarin was held - needs bridging with lovenox. 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  Preventative: COLONOSCOPY Date: 03/2014 diverticulosis, int hem, rpt 5 yrs for h/o polyps ([place]). Requests referral to Dr [name].  Well woman - 10/2018 - established with Dr Kathryne Eriksson.  Mammogram - 10/2018 - normal at [facility] office.  DEXA - normal 2018.  Yearly flu shot Tetanus - 2008 Pneumovax2017, prevnar 05/2014 zostavax -declines. Shingrix - discussed Advanced directive -Scanned 08/2015. Husband [name] then daughter [name] are [place]. Declines prolonged life support if terminal condition. [place] with temporary life support for reversible condition. POST form completed and in chart. MOST form provided today for patient to review. Seat belt use discussed. Sunscreen use discussed. No changing  moles on skin. Personal h/o melanoma.Sees derm regularly (Dr [name]) [place].  Ex smoker  Alcohol - occasional wine Dentist tries yearly - full dentures Eye exam yearly ([facility]). H/o L vision loss from CVA and glaucoma Bowel - no constipation Bladder - no incontinence - some urge, no stress symptoms.   Lives with husband, 1 cat Occupation: retired Therapist, sports (2007), worked for [employer] Edu: nursing school Activity: keeps 47 yo grandson Diet: good water, fruits/vegetables      Relevant past medical, surgical, family and social history reviewed and updated as indicated. Interim medical history since our last visit reviewed. Allergies and medications reviewed and updated. Outpatient Medications Prior to Visit  Medication Sig Dispense Refill  . acetaminophen (TYLENOL) 500 MG tablet Take 500-1,000 mg by mouth every 6 (six) hours as needed (for headaches/pain.).    [name] aspirin EC 81 MG tablet Take 81 mg by mouth daily.    . bumetanide (BUMEX) 0.5 MG tablet Take 1 tablet (0.5 mg total) by mouth daily. Continue to monitor weight, if you start gaining fluids, then increase back to 1 mg daily. 90 tablet 2  . carvedilol (COREG) 12.5 MG tablet Take 1 tablet (12.5 mg total) by mouth 2 (two) times daily with a meal. 180 tablet 2  . LUMIGAN 0.01 % SOLN Place 1 drop into both eyes at bedtime.     . Magnesium 250 MG TABS Take 250 mg by mouth daily.    . meclizine (ANTIVERT) 25 MG tablet Take 1  tablet (25 mg total) by mouth 2 (two) times daily as needed for dizziness. 180 tablet 0  . Polyethyl Glycol-Propyl Glycol (LUBRICANT EYE DROPS) 0.4-0.3 % SOLN Place 1 drop into both eyes 3 (three) times daily as needed (for dry eyes.).    [name] sacubitril-valsartan (ENTRESTO) 24-26 MG Take 1 tablet by mouth 2 (two) times daily. 180 tablet 2  . spironolactone (ALDACTONE) 25 MG tablet Take 0.5 tablets (12.5 mg total) by mouth daily. 45 tablet 2  . vitamin E 400 UNIT capsule Take 400 Units by mouth daily.    [name] warfarin  (COUMADIN) 3 MG tablet Take as directed by the coumadin clinic. 100 tablet 1   No facility-administered medications prior to visit.      Per HPI unless specifically indicated in ROS section below Review of Systems Objective:    BP 101/69   Pulse 75   Temp (!) 96.7 F (35.9 C) (Oral)   Ht 5\' 4"  (1.626 m)   Wt 130 lb 8 oz (59.2 kg)   LMP  (LMP Unknown) Comment: menopause age 89  SpO2 97%   BMI 22.40 kg/m   Wt Readings from Last 3 Encounters:  11/24/18 130 lb 8 oz (59.2 kg)  09/30/18 131 lb (59.4 kg)  04/15/18 137 lb 8 oz (62.4 kg)     Physical exam: Gen: alert, NAD, not ill appearing Pulm: speaks in complete sentences without increased work of breathing Psych: normal mood, normal thought content      Results for orders placed or performed in visit on 11/24/18  HM MAMMOGRAPHY  Result Value Ref Range   HM Mammogram Self Reported Normal 0-4 Bi-Rad, Self Reported Normal   Assessment & Plan:   Problem List Items Addressed This Visit    Status post mitral valve replacement with tissue valve   Status post biventricular pacemaker   Prediabetes    Reviewed with patient, encouraged limiting added sugars in diet.       RESOLVED: Postmenopausal HRT (hormone replacement therapy)    Has tapered off hormone replacement.       History of melanoma    Sees derm yearly.      History of cerebrovascular accident (CVA) in adulthood    Occurred while off coumadin without bridge.       Glaucoma    Followed by ophtho (Porfilio)      Dyslipidemia    Chronic, stable off statin.  The 10-year ASCVD risk score [name], et al., 2013) is: 15.1%   Values used to calculate the score:     Age: 16 years     Sex: Female     Is Non-Hispanic African American: No     Diabetic: No     Tobacco smoker: No     Systolic Blood Pressure: 854 mmHg     Is BP treated: Yes     HDL Cholesterol: 60.9 mg/dL     Total Cholesterol: 162 mg/dL       CKD (chronic kidney disease) stage 3, GFR  30-59 ml/min ([place]) - Primary    Reviewed with patient. Encouraged good hydration status, avoiding neprotoxic agents.       Chronic systolic heart failure ([place])    Appreciate cards care.        Other Visit Diagnoses    Special screening for malignant neoplasms, colon       Relevant Orders   Ambulatory referral to Gastroenterology       Meds ordered this encounter  Medications  .  Cholecalciferol (VITAMIN D) 50 MCG (2000 UT) CAPS    Sig: Take 1 capsule (2,000 Units total) by mouth daily.    Dispense:  30 capsule   Orders Placed This Encounter  Procedures  . HM MAMMOGRAPHY    This external order was created through the Results Console.  . Ambulatory referral to Gastroenterology    Referral Priority:   Routine    Referral Type:   Consultation    Referral Reason:   Specialty Services Required    Number of Visits Requested:   1    I discussed the assessment and treatment plan with the patient. The patient was provided an opportunity to ask questions and all were answered. The patient agreed with the plan and demonstrated an understanding of the instructions. The patient was advised to call back or seek an in-person evaluation if the symptoms worsen or if the condition fails to improve as anticipated.  Follow up plan: Return in about 1 year (around 11/24/2019) for follow up visit, medicare wellness visit.  Ria Bush, MD

## 2018-11-24 NOTE — Assessment & Plan Note (Signed)
Occurred while off coumadin without bridge.

## 2018-11-24 NOTE — Assessment & Plan Note (Signed)
Reviewed with patient, encouraged limiting added sugars in diet.  

## 2018-11-24 NOTE — Assessment & Plan Note (Signed)
Sees derm yearly

## 2018-11-24 NOTE — Assessment & Plan Note (Signed)
Reviewed with patient. Encouraged good hydration status, avoiding neprotoxic agents.

## 2018-11-24 NOTE — Assessment & Plan Note (Addendum)
Chronic, stable off statin.  The 10-year ASCVD risk score Elizabeth Hall Elizabeth Hall., et al., 2013) is: 15.1%   Values used to calculate the score:     Age: 78 years     Sex: Female     Is Non-Hispanic African American: No     Diabetic: No     Tobacco smoker: No     Systolic Blood Pressure: 156 mmHg     Is BP treated: Yes     HDL Cholesterol: 60.9 mg/dL     Total Cholesterol: 162 mg/dL

## 2018-11-24 NOTE — Assessment & Plan Note (Signed)
Followed by ophtho (Porfilio)

## 2018-11-24 NOTE — Assessment & Plan Note (Signed)
Appreciate cards care. 

## 2018-11-29 ENCOUNTER — Other Ambulatory Visit: Payer: Self-pay

## 2018-11-29 ENCOUNTER — Telehealth: Payer: Self-pay | Admitting: Cardiovascular Disease

## 2018-11-29 MED ORDER — MECLIZINE HCL 25 MG PO TABS: 25.0000 mg | ORAL_TABLET | Freq: Two times a day (BID) | ORAL | 0 refills | Status: DC | PRN

## 2018-11-29 NOTE — Telephone Encounter (Signed)
°*  STAT* If patient is at the pharmacy, call can be transferred to refill team.   1. Which medications need to be refilled? (please list name of each medication and dose if known) meclizine 25 MG 1 tablet 2 times as needed    2. Which pharmacy/location (including street and city if local pharmacy) is medication to be sent to? [facility]   3. Do they need a 30 day or 90 day supply? 90 day

## 2018-11-29 NOTE — Telephone Encounter (Signed)
meclizine (ANTIVERT) 25 MG tablet 180 tablet 0 11/29/2018    Sig - Route: Take 1 tablet (25 mg total) by mouth 2 (two) times daily as needed for dizziness. - Oral   Sent to pharmacy as: meclizine (ANTIVERT) 25 MG tablet   E-Prescribing Status: Receipt confirmed by pharmacy (11/29/2018 12:07 PM EDT)   Pharmacy   [facility], [place]

## 2018-12-10 ENCOUNTER — Telehealth: Payer: Self-pay

## 2018-12-10 NOTE — Telephone Encounter (Signed)

## 2018-12-13 ENCOUNTER — Ambulatory Visit (INDEPENDENT_AMBULATORY_CARE_PROVIDER_SITE_OTHER): Payer: Medicare Other

## 2018-12-13 ENCOUNTER — Other Ambulatory Visit: Payer: Self-pay

## 2018-12-13 DIAGNOSIS — I4891 Unspecified atrial fibrillation: Secondary | ICD-10-CM

## 2018-12-13 DIAGNOSIS — Z953 Presence of xenogenic heart valve: Secondary | ICD-10-CM | POA: Diagnosis not present

## 2018-12-13 DIAGNOSIS — Z5181 Encounter for therapeutic drug level monitoring: Secondary | ICD-10-CM

## 2018-12-13 LAB — POCT INR: INR: 3.2 — AB (ref 2.0–3.0)

## 2018-12-13 NOTE — Patient Instructions (Signed)
Please have a large serving of greens today and continue 1 tablet every day EXCEPT 1.5 ON MONDAYS. Recheck in 6 weeks.

## 2018-12-14 ENCOUNTER — Telehealth (INDEPENDENT_AMBULATORY_CARE_PROVIDER_SITE_OTHER): Payer: Medicare Other | Admitting: Internal Medicine

## 2018-12-14 ENCOUNTER — Other Ambulatory Visit: Payer: Self-pay

## 2018-12-14 VITALS — BP 130/72 | HR 76 | Ht 63.0 in | Wt 132.0 lb

## 2018-12-14 DIAGNOSIS — I4821 Permanent atrial fibrillation: Secondary | ICD-10-CM | POA: Diagnosis not present

## 2018-12-14 DIAGNOSIS — I428 Other cardiomyopathies: Secondary | ICD-10-CM

## 2018-12-14 DIAGNOSIS — Z95810 Presence of automatic (implantable) cardiac defibrillator: Secondary | ICD-10-CM

## 2018-12-14 NOTE — Progress Notes (Signed)
Electrophysiology TeleHealth Note   Due to national recommendations of social distancing due to COVID 19, an audio/video telehealth visit is felt to be most appropriate for this patient at this time.  See MyChart message from today for the patient's consent to telehealth for [facility].   Date:  12/14/2018   ID:  Elizabeth Hall, DOB 1941/05/20, MRN [id number]  Location: patient's home  Provider location: [address], [place]  Evaluation Performed: Follow-up visit  PCP:  Ria Bush, MD  Cardiologist:   MA Electrophysiologist:  SK   Chief Complaint:  NICM and CHB  History of Present Illness:    Elizabeth Hall is a 78 y.o. female who presents via audio/video conferencing for a telehealth visit today.  Since last being seen in our clinic, the patient reports doing much better  She has hx of AFib and underwent AV nodal ablation and pacemaker placement in November 2013. 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 Her ejection fraction deteriorated and she underwent explantation of the previous pacemaker and implantation of biventricular ICD in March 2015.  She has hx of CVA with holding of anticoagulation; currently INR taret 2.5--3 with asa 81;  Hx of visual bleeding    No bleeding but some bruising  The patient denies chest pain, shortness of breath, nocturnal dyspnea, orthopnea or peripheral edema.  There have been no palpitations, lightheadedness or syncope.      DATE TEST EF   8/15    Echo    40 %   7/17    Echo   35-40 %   10/17  myoview 45-50% NO ischemia  7/18 LHC 35-40% Non Obstructive     Date Cr K  Hgb  1/18 1.3 4.6  12.8  5/19  1.47 4.2 12.5 (10/19)  2/20 1.13 4.4      The patient denies symptoms of fevers, chills, cough, or new SOB worrisome for COVID 19.    Past Medical  History:  Diagnosis Date  . Bleeding of eye   . Chronic systolic heart failure ([place])    a. 11/2016 Echo: EF 35-40%, mild MR w/ nl fxning bioprosthesis, mod dil LA.  . CKD (chronic kidney disease) stage 3, GFR 30-59 ml/min (HCC)    baseline Cr 1.5-2; prior saw nephrologist thought hypertensive nephropathy/renovascular disease  . CVA (cerebral infarction)    a. 2014 occipital lobe - some vision loss when coumadin held and not bridged  . Dyslipidemia   . Glaucoma   . H. pylori infection    treated  . H/O mitral valve replacement    a. 05/2012 s/p 60mm Carpentier Edwards pericardial tissue valve.  [name] Hx of rheumatic fever   . Hypertension   . Melanoma ([place]) 2006   L shoulder  . NICM (nonischemic cardiomyopathy) ([place])    a. 08/2013 s/p Biotronik Ilesto 7 HF1 BiV ICD (ser# [id number]);  b. 11/2016 Echo: EF 35-40%.  . Non-obstructive CAD    a. s/p MI 2001;  b. 12/2016 Cath: LM 20ost, OM2 60, RCA 30p/d, EF 35-45%. Nl R heart filling pressures.  . Permanent atrial fibrillation    a. 04/2012 s/p AVN & PPM placement (later upgraded to BiV ICD).  [name] Phlebitis   . 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 06/2012 h/o acute ischemia due to thromboembolism radial and ulnar arteries when  coumadin held s/p atherectomy - needs bridging if off coumadin  . Tricuspid regurgitation    a. 05/2012 s/p TV annuloplasty @ time of MVR.    Past Surgical History:  Procedure Laterality Date  . ABDOMINAL HYSTERECTOMY    . ABLATION  2013  . ATHERECTOMY  06/2012   left arm after coumadin held without bridging  . BREAST BIOPSY    . CARDIOVASCULAR STRESS TEST  03/2016   WNL  . CATARACT EXTRACTION Bilateral 2007, 2010  . COLONOSCOPY  03/2014   diverticulosis, int hem, rpt 5 yrs for h/o polyps ([place])  . defibrillator     MEDTRONIC 5086 MRI 52 CM LEAD, SERIAL # [id number]  . DEXA  07/2016   WNL  . DG ABDOMEN COMPLETE ([place] HX)  02/2013   HH, mod severe erosive gastritis, duodenitis  . GALLBLADDER SURGERY   2006  . INSERT / REPLACE / REMOVE PACEMAKER  08/2013  . MITRAL VALVE REPLACEMENT  05/2012  . RIGHT/LEFT HEART CATH AND CORONARY ANGIOGRAPHY N/A 12/29/2016   Procedure: Right/Left Heart Cath and Coronary Angiography;  Surgeon: [name], MD;  Location: [facility];  Service: Cardiovascular;  Laterality: N/A;  . TOTAL ABDOMINAL HYSTERECTOMY W/ BILATERAL SALPINGOOPHORECTOMY  1991   irregular bleeding  . TRICUSPID VALVE REPLACEMENT  05/2012  . US ECHOCARDIOGRAPHY  02/2014   EF 40%, LA marked dilation, gen LV hypokinesis, mitral prosthetic valve    Current Outpatient Medications  Medication Sig Dispense Refill  . acetaminophen (TYLENOL) 500 MG tablet Take 500-1,000 mg by mouth every 6 (six) hours as needed (for headaches/pain.).    [name] aspirin EC 81 MG tablet Take 81 mg by mouth daily.    . bumetanide (BUMEX) 0.5 MG tablet Take 1 tablet (0.5 mg total) by mouth daily. Continue to monitor weight, if you start gaining fluids, then increase back to 1 mg daily. 90 tablet 2  . carvedilol (COREG) 12.5 MG tablet Take 1 tablet (12.5 mg total) by mouth 2 (two) times daily with a meal. 180 tablet 2  . Cholecalciferol (VITAMIN D) 50 MCG (2000 UT) CAPS Take 1 capsule (2,000 Units total) by mouth daily. 30 capsule   . LUMIGAN 0.01 % SOLN Place 1 drop into both eyes at bedtime.     . Magnesium 250 MG TABS Take 250 mg by mouth daily.    . meclizine (ANTIVERT) 25 MG tablet Take 1 tablet (25 mg total) by mouth 2 (two) times daily as needed for dizziness. 180 tablet 0  . Polyethyl Glycol-Propyl Glycol (LUBRICANT EYE DROPS) 0.4-0.3 % SOLN Place 1 drop into both eyes 3 (three) times daily as needed (for dry eyes.).    [name] sacubitril-valsartan (ENTRESTO) 24-26 MG Take 1 tablet by mouth 2 (two) times daily. 180 tablet 2  . spironolactone (ALDACTONE) 25 MG tablet Take 0.5 tablets (12.5 mg total) by mouth daily. 45 tablet 2  . vitamin E 400 UNIT capsule Take 400 Units by mouth daily.    [name] warfarin (COUMADIN) 3  MG tablet Take as directed by the coumadin clinic. 100 tablet 1   No current facility-administered medications for this visit.     Allergies:   Phytonadione, Amiodarone, Ciprofloxacin, Clarithromycin, Lisinopril, Metronidazole, and Phytonadione [vitamin k1]   Social History:  The patient  reports that she quit smoking about 28 years ago. Her smoking use included cigarettes. She has a 10.00 pack-year smoking history. She has never used smokeless tobacco. She reports current alcohol use. She reports that she does  not use drugs.   Family History:  The patient's   family history includes CAD (age of onset: 35) in her father; Cancer in her cousin, cousin, and maternal aunt; Diabetes in her cousin; Heart failure in her brother and sister; Hypertension in her brother; Stroke in her brother and sister.   ROS:  Please see the history of present illness.   All other systems are personally reviewed and negative.    Exam:    Vital Signs:  BP 130/72 (BP Location: Left Arm, Patient Position: Sitting, Cuff Size: Normal)   Pulse 76   Ht 5\' 3"  (1.6 m)   Wt 132 lb (59.9 kg)   LMP  (LMP Unknown) Comment: menopause age 20  BMI 23.38 kg/m  *   Well appearing, alert and conversant, regular work of breathing,  good skin color Eyes- anicteric, neuro- grossly intact, skin- no apparent rash or lesions or cyanosis, mouth- oral mucosa is pink   Labs/Other Tests and Data Reviewed:    Recent Labs: 03/25/2018: Hemoglobin 12.5; Magnesium 2.4; Platelets 250; TSH 1.290 08/02/2018: BUN 38; Creatinine, Ser 1.13; Potassium 4.4; Sodium 140   Wt Readings from Last 3 Encounters:  12/14/18 132 lb (59.9 kg)  11/24/18 130 lb 8 oz (59.2 kg)  09/30/18 131 lb (59.4 kg)     Other studies personally reviewed: Additional studies/ records that were reviewed today include:As above     Last device remote is reviewed from [facility] PDF dated 5/20 which reveals normal device function,   arrhythmias - none     ASSESSMENT & PLAN:     NICM  Complete heart block  S/p AV Ablation  ICD - CRT Biotronik  Atrial Fib  Permanent  CVA  Congestive heart failure/chronic-systolic-class 2  High risk medication surveillance  ECG-unusual for CRT  Renal insufficiency grade 3  Euvolemic continue current meds  On Anticoagulation;  No bleeding issues      COVID 19 screen The patient denies symptoms of COVID 19 at this time.  The importance of social distancing was discussed today.  Follow-up:  20m Next remote: As Scheduled  Current medicines are reviewed at length with the patient today.   The patient does not have concerns regarding her medicines.  The following changes were made today:  none  Labs/ tests ordered today include:   No orders of the defined types were placed in this encounter.   Future tests ( post COVID )     Patient Risk:  after full review of this patients clinical status, I feel that they are at moderate risk at this time.  Today, I have spent 5 minutes with the patient with telehealth technology discussing the above.  Signed, Virl Axe, MD  12/14/2018 9:41 AM     [place] [address] [phone] (office) [phone] (fax)

## 2018-12-14 NOTE — Patient Instructions (Addendum)
Medication Instructions:  - Your physician recommends that you continue on your current medications as directed. Please refer to the Current Medication list given to you today.  If you need a refill on your cardiac medications before your next appointment, please call your pharmacy.   Lab work: - none ordered  If you have labs (blood work) drawn today and your tests are completely normal, you will receive your results only by: [name] MyChart Message (if you have MyChart) OR . A paper copy in the mail If you have any lab test that is abnormal or we need to change your treatment, we will call you to review the results.  Testing/Procedures: - none ordered  Follow-Up: At [facility], you and your health needs are our priority.  As part of our continuing mission to provide you with exceptional heart care, we have created designated Provider Care Teams.  These Care Teams include your primary Cardiologist (physician) and Advanced Practice Providers (APPs -  Physician Assistants and Nurse Practitioners) who all work together to provide you with the care you need, when you need it.  You will need a follow up appointment in 1 year with Dr. Caryl Hall (June 2021). Please call our office 2 months in advance to schedule this appointment.  (call in early April 2021 to schedule)  Remote monitoring is used to monitor your Pacemaker of ICD from home. This monitoring reduces the number of office visits required to check your device to one time per year. It allows [place] to keep an eye on the functioning of your device to ensure it is working properly. You are scheduled for a device check from home on 02/08/2019. You may send your transmission at any time that day. If you have a wireless device, the transmission will be sent automatically. After your physician reviews your transmission, you will receive a postcard with your next transmission date.  Any Other Special Instructions Will Be Listed Below (If Applicable). -  N/A

## 2018-12-28 ENCOUNTER — Encounter: Payer: Self-pay | Admitting: *Deleted

## 2019-01-05 ENCOUNTER — Telehealth: Payer: Self-pay | Admitting: Gastroenterology

## 2019-01-05 NOTE — Telephone Encounter (Signed)
Pt left vm for Sharyn Lull she states she picked up some instructions about a week ago but has not scheduled apt yet she would like a call please

## 2019-01-05 NOTE — Telephone Encounter (Signed)
Patient has scheduled an office visit to discuss her colonoscopy prior to scheduling her colonoscopy to meet the physician and discuss her medical history.  Thanks [employer]

## 2019-01-24 ENCOUNTER — Other Ambulatory Visit: Payer: Self-pay

## 2019-01-24 ENCOUNTER — Ambulatory Visit (INDEPENDENT_AMBULATORY_CARE_PROVIDER_SITE_OTHER): Payer: Medicare Other

## 2019-01-24 DIAGNOSIS — I4821 Permanent atrial fibrillation: Secondary | ICD-10-CM

## 2019-01-24 DIAGNOSIS — Z5181 Encounter for therapeutic drug level monitoring: Secondary | ICD-10-CM | POA: Diagnosis not present

## 2019-01-24 DIAGNOSIS — Z953 Presence of xenogenic heart valve: Secondary | ICD-10-CM

## 2019-01-24 DIAGNOSIS — I4891 Unspecified atrial fibrillation: Secondary | ICD-10-CM

## 2019-01-24 LAB — POCT INR: INR: 2.2 (ref 2.0–3.0)

## 2019-01-24 NOTE — Patient Instructions (Signed)
Please take extra 1/2 tablet today, then continue 1 tablet every day EXCEPT 1.5 ON MONDAYS. Recheck in 6 weeks.

## 2019-02-01 ENCOUNTER — Telehealth: Payer: Self-pay | Admitting: *Deleted

## 2019-02-01 NOTE — Telephone Encounter (Signed)
Patient was called about her husband who is also a patient. Patient had question for herself regarding the Covid virus and her job.  She is a Tourist information centre manager at [employer] and is still afraid to work in front of people there. She says people who visit [employer] do not all wear masks and she is afraid for her health and her husbands. Dr Fletcher Anon did a letter back in March at the start of [place] regarding this. Her employer will continue to honor it, patient just wants Dr Tyrell Antonio opinion on if she should go back to work or not. She does not need another letter.  She prefers not too.

## 2019-02-02 NOTE — Telephone Encounter (Signed)
She should not resume that kind of work yet.

## 2019-02-03 NOTE — Telephone Encounter (Signed)
Patient verbalized understanding of Dr Tyrell Antonio recommendations.

## 2019-02-08 ENCOUNTER — Other Ambulatory Visit: Payer: Self-pay

## 2019-02-08 ENCOUNTER — Ambulatory Visit (INDEPENDENT_AMBULATORY_CARE_PROVIDER_SITE_OTHER): Payer: Medicare Other | Admitting: Gastroenterology

## 2019-02-08 ENCOUNTER — Encounter: Payer: Self-pay | Admitting: Gastroenterology

## 2019-02-08 ENCOUNTER — Ambulatory Visit (INDEPENDENT_AMBULATORY_CARE_PROVIDER_SITE_OTHER): Payer: Medicare Other | Admitting: *Deleted

## 2019-02-08 VITALS — BP 107/65 | HR 76 | Temp 97.3°F | Ht 64.0 in | Wt 133.6 lb

## 2019-02-08 DIAGNOSIS — Z23 Encounter for immunization: Secondary | ICD-10-CM | POA: Diagnosis not present

## 2019-02-08 DIAGNOSIS — Z7189 Other specified counseling: Secondary | ICD-10-CM | POA: Diagnosis not present

## 2019-02-08 DIAGNOSIS — R1013 Epigastric pain: Secondary | ICD-10-CM | POA: Diagnosis not present

## 2019-02-08 DIAGNOSIS — I5022 Chronic systolic (congestive) heart failure: Secondary | ICD-10-CM | POA: Diagnosis not present

## 2019-02-08 DIAGNOSIS — I4821 Permanent atrial fibrillation: Secondary | ICD-10-CM

## 2019-02-08 NOTE — Progress Notes (Signed)
Gastroenterology Consultation  Referring Provider:     Ria Bush, MD Primary Care Physician:  Ria Bush, MD Primary Gastroenterologist:  Dr. [name]     Reason for Consultation:     Abdominal pain and discussion before colonoscopy        HPI:   Elizabeth Hall is a 78 y.o. y/o female referred for consultation & management of abdominal pain and discussion before colonoscopy by Dr. Ria Bush, MD.  This patient comes in today with a history of colon polyps and is in need of a repeat colonoscopy.  [place]  The patient now reports that she is on Coumadin for an irregular heart rate.  The patient has stopped her Coumadin in the past but ended up with a clot in her left arm and a stroke.  The patient also reports that she had abdominal pain that has gone away. There is no report of any unexplained weight loss fevers chills nausea or vomiting.  The patient also reports that that she wanted to discuss a colonoscopy prior to having it done because she also has a mitral valve replacement which she states is a pig valve.  Past Medical History:  Diagnosis Date  . Bleeding of eye   . Chronic systolic heart failure ([place])    a. 11/2016 Echo: EF 35-40%, mild MR w/ nl fxning bioprosthesis, mod dil LA.  . CKD (chronic kidney disease) stage 3, GFR 30-59 ml/min (HCC)    baseline Cr 1.5-2; prior saw nephrologist thought hypertensive nephropathy/renovascular disease  . CVA (cerebral infarction)    a. 2014 occipital lobe - some vision loss when coumadin held and not bridged  . Dyslipidemia   . Glaucoma   . H. pylori infection    treated  . H/O mitral valve replacement    a. 05/2012 s/p 82mm Carpentier Edwards pericardial tissue valve.  [name] Hx of rheumatic fever   . Hypertension   . Melanoma ([place]) 2006   L shoulder  . NICM (nonischemic cardiomyopathy) ([place])    a. 08/2013 s/p Biotronik Ilesto 7 HF1 BiV ICD (ser# [id number]);  b.  11/2016 Echo: EF 35-40%.  . Non-obstructive CAD    a. s/p MI 2001;  b. 12/2016 Cath: LM 20ost, OM2 60, RCA 30p/d, EF 35-45%. Nl R heart filling pressures.  . Permanent atrial fibrillation    a. 04/2012 s/p AVN & PPM placement (later upgraded to BiV ICD).  [name] Phlebitis   . Thromboembolism of upper extremity artery ([place])    a. 06/2012 h/o acute ischemia due to thromboembolism radial and ulnar arteries when coumadin held s/p atherectomy - needs bridging if off coumadin  . Tricuspid regurgitation    a. 05/2012 s/p TV annuloplasty @ time of MVR.    Past Surgical History:  Procedure Laterality Date  . ABDOMINAL HYSTERECTOMY    . ABLATION  2013  . ATHERECTOMY  06/2012   left arm after coumadin held without bridging  . BREAST BIOPSY    . CARDIOVASCULAR STRESS TEST  03/2016   WNL  . CATARACT EXTRACTION Bilateral 2007, 2010  . COLONOSCOPY  03/2014   diverticulosis, int hem, rpt 5 yrs for h/o polyps ([place])  . defibrillator     MEDTRONIC 5086 MRI 52 CM LEAD, SERIAL # [id number]  . DEXA  07/2016   WNL  . DG ABDOMEN COMPLETE ([facility] HX)  02/2013   HH, mod severe erosive gastritis, duodenitis  . GALLBLADDER SURGERY  2006  . INSERT / REPLACE / REMOVE PACEMAKER  08/2013  . MITRAL VALVE REPLACEMENT  05/2012  . RIGHT/LEFT HEART CATH AND CORONARY ANGIOGRAPHY N/A 12/29/2016   Procedure: Right/Left Heart Cath and Coronary Angiography;  Surgeon: [name], MD;  Location: [facility];  Service: Cardiovascular;  Laterality: N/A;  . TOTAL ABDOMINAL HYSTERECTOMY W/ BILATERAL SALPINGOOPHORECTOMY  1991   irregular bleeding  . TRICUSPID VALVE REPLACEMENT  05/2012  . US ECHOCARDIOGRAPHY  02/2014   EF 40%, LA marked dilation, gen LV hypokinesis, mitral prosthetic valve    Prior to Admission medications   Medication Sig Start Date End Date Taking? Authorizing Provider  acetaminophen (TYLENOL) 500 MG tablet Take 500-1,000 mg by mouth every 6 (six) hours as needed (for headaches/pain.).    [provider]  aspirin EC 81 MG tablet Take 81 mg by mouth daily.    [provider]  bumetanide (BUMEX) 0.5 MG tablet Take 1 tablet (0.5 mg total) by mouth daily. Continue to monitor weight, if you start gaining fluids, then increase back to 1 mg daily. 10/26/18   [name], MD  carvedilol (COREG) 12.5 MG tablet Take 1 tablet (12.5 mg total) by mouth 2 (two) times daily with a meal. 07/16/18   [name], MD  Cholecalciferol (VITAMIN D) 50 MCG (2000 UT) CAPS Take 1 capsule (2,000 Units total) by mouth daily. 11/24/18   Ria Bush, MD  LUMIGAN 0.01 % SOLN Place 1 drop into both eyes at bedtime.  10/23/17   [provider]  Magnesium 250 MG TABS Take 250 mg by mouth daily.    [provider]  meclizine (ANTIVERT) 25 MG tablet Take 1 tablet (25 mg total) by mouth 2 (two) times daily as needed for dizziness. 11/29/18   [name], MD  Polyethyl Glycol-Propyl Glycol (LUBRICANT EYE DROPS) 0.4-0.3 % SOLN Place 1 drop into both eyes 3 (three) times daily as needed (for dry eyes.).    [provider]  sacubitril-valsartan (ENTRESTO) 24-26 MG Take 1 tablet by mouth 2 (two) times daily. 10/08/18   [name], MD  spironolactone (ALDACTONE) 25 MG tablet Take 0.5 tablets (12.5 mg total) by mouth daily. 11/01/18   Minna Merritts, MD  vitamin E 400 UNIT capsule Take 400 Units by mouth daily.    [provider]  warfarin (COUMADIN) 3 MG tablet Take as directed by the coumadin clinic. 10/18/18   [name], MD    Family History  Problem Relation Age of Onset  . CAD Father 6       MI  . Hypertension Brother   . Heart failure Brother        CHF with pacemaker  . Stroke Brother   . Heart failure Sister        CHF  . Stroke Sister   . Diabetes Cousin   . Cancer Maternal Aunt        colon  . Cancer Cousin        breast  . Cancer Cousin        brain tumor     Social History   Tobacco Use  . Smoking status: Former Smoker     Packs/day: 0.50    Years: 20.00    Pack years: 10.00    Types: Cigarettes    Quit date: 02/04/1990    Years since quitting: 29.0  . Smokeless tobacco: Never Used  Substance Use Topics  . Alcohol use: Yes  Alcohol/week: 0.0 standard drinks    Comment: occasional  . Drug use: No    Allergies as of 02/08/2019 - Review Complete 11/24/2018  Allergen Reaction Noted  . Phytonadione Other (See Comments) 03/25/2018  . Amiodarone Other (See Comments) 01/02/2015  . Ciprofloxacin Rash 09/18/2014  . Clarithromycin Rash 09/18/2014  . Lisinopril Rash 01/02/2015  . Metronidazole Rash 09/18/2014  . Phytonadione [vitamin k1] Other (See Comments) 01/02/2015    Review of Systems:    All systems reviewed and negative except where noted in HPI.   Physical Exam:  LMP  (LMP Unknown) Comment: menopause age 8 No LMP recorded (lmp unknown). Patient is postmenopausal. General:   Alert,  Well-developed, well-nourished, pleasant and cooperative in NAD Head:  Normocephalic and atraumatic. Eyes:  Sclera clear, no icterus.   Conjunctiva pink. Ears:  Normal auditory acuity. Nose:  No deformity, discharge, or lesions. Mouth:  No deformity or lesions,oropharynx pink & moist. Neck:  Supple; no masses or thyromegaly. Lungs:  Respirations even and unlabored.  Clear throughout to auscultation.   No wheezes, crackles, or rhonchi. No acute distress. Heart:  Regular rate and rhythm; no murmurs, clicks, rubs, or gallops. Abdomen:  Normal bowel sounds.  No bruits.  Soft, non-tender and non-distended without masses, hepatosplenomegaly or hernias noted.  No guarding or rebound tenderness.  Negative Carnett sign.   Rectal:  Deferred.  Msk:  Symmetrical without gross deformities.  Good, equal movement & strength bilaterally. Pulses:  Normal pulses noted. Extremities:  No clubbing or edema.  No cyanosis. Neurologic:  Alert and oriented x3;  grossly normal neurologically. Skin:  Intact without significant lesions or  rashes.  No jaundice. Lymph Nodes:  No significant cervical adenopathy. Psych:  Alert and cooperative. Normal mood and affect.  Imaging Studies: No results found.  Assessment and Plan:   Elizabeth Hall is a 78 y.o. y/o female who comes in today with a history of atrial fibrillation and a mitral valve replacement who also has a history of colon polyps.  The patient had abdominal pain but she states that it has resolved some time ago and she is not having any abdominal pain at the present time.  The patient is in need of a colonoscopy.  The patient will have her cardiologist contacted to see how to get her arranged for the procedure since she has had complications when stopping the Coumadin in the past.  The patient has been explained the plan and agrees with it.  [name], MD. Marval Regal     Note: This dictation was prepared with Dragon dictation along with smaller phrase technology. Any transcriptional errors that result from this process are unintentional.

## 2019-02-09 LAB — CUP PACEART REMOTE DEVICE CHECK
Date Time Interrogation Session: 20200819090603
Implantable Lead Implant Date: 20150316
Implantable Lead Implant Date: 20150316
Implantable Lead Location: 753858
Implantable Lead Location: 753860
Implantable Lead Model: 346
Implantable Lead Serial Number: 25130583
Implantable Pulse Generator Implant Date: 20150316
Pulse Gen Model: 383547
Pulse Gen Serial Number: 60765179

## 2019-02-10 ENCOUNTER — Other Ambulatory Visit: Payer: Self-pay

## 2019-02-10 DIAGNOSIS — K1370 Unspecified lesions of oral mucosa: Secondary | ICD-10-CM | POA: Diagnosis not present

## 2019-02-10 DIAGNOSIS — Z86010 Personal history of colonic polyps: Secondary | ICD-10-CM

## 2019-02-15 ENCOUNTER — Encounter: Payer: Self-pay | Admitting: Family Medicine

## 2019-02-16 NOTE — Progress Notes (Signed)
Remote ICD transmission.   

## 2019-02-17 ENCOUNTER — Telehealth: Payer: Self-pay | Admitting: Gastroenterology

## 2019-02-17 ENCOUNTER — Telehealth: Payer: Self-pay

## 2019-02-17 NOTE — Telephone Encounter (Signed)
Patient called to cancel colonoscopy 03-01-19 due to transportation.She will call & r/s.

## 2019-02-17 NOTE — Telephone Encounter (Signed)
Patients call has been returned.  Informed her message was received in regards to canceling her colonoscopy.  Procedure has already been canceled with Trish in Endo. Canceled due to work schedule.  Thanks [employer]

## 2019-02-17 NOTE — Telephone Encounter (Signed)
Colonoscopy cancelled at [facility].  

## 2019-03-01 ENCOUNTER — Ambulatory Visit: Admit: 2019-03-01 | Payer: Medicare Other | Admitting: Gastroenterology

## 2019-03-01 SURGERY — COLONOSCOPY WITH PROPOFOL
Anesthesia: General

## 2019-03-07 ENCOUNTER — Ambulatory Visit (INDEPENDENT_AMBULATORY_CARE_PROVIDER_SITE_OTHER): Payer: Medicare Other

## 2019-03-07 ENCOUNTER — Other Ambulatory Visit: Payer: Self-pay

## 2019-03-07 DIAGNOSIS — I4821 Permanent atrial fibrillation: Secondary | ICD-10-CM

## 2019-03-07 DIAGNOSIS — I4891 Unspecified atrial fibrillation: Secondary | ICD-10-CM | POA: Diagnosis not present

## 2019-03-07 DIAGNOSIS — Z5181 Encounter for therapeutic drug level monitoring: Secondary | ICD-10-CM

## 2019-03-07 DIAGNOSIS — Z953 Presence of xenogenic heart valve: Secondary | ICD-10-CM | POA: Diagnosis not present

## 2019-03-07 LAB — POCT INR: INR: 2.5 (ref 2.0–3.0)

## 2019-03-07 NOTE — Patient Instructions (Signed)
Please continue 1 tablet every day EXCEPT 1.5 ON MONDAYS. Recheck in 6 weeks.

## 2019-03-31 ENCOUNTER — Other Ambulatory Visit: Payer: Self-pay

## 2019-03-31 ENCOUNTER — Encounter: Payer: Self-pay | Admitting: Cardiovascular Disease

## 2019-03-31 ENCOUNTER — Other Ambulatory Visit
Admission: RE | Admit: 2019-03-31 | Discharge: 2019-03-31 | Disposition: A | Discharge status: Home / Self Care | Payer: Medicare Other | Source: Ambulatory Visit | Attending: Cardiovascular Disease | Admitting: Cardiovascular Disease

## 2019-03-31 ENCOUNTER — Ambulatory Visit (INDEPENDENT_AMBULATORY_CARE_PROVIDER_SITE_OTHER): Payer: Medicare Other | Admitting: Cardiovascular Disease

## 2019-03-31 VITALS — BP 120/70 | HR 75 | Temp 96.9°F | Ht 63.0 in | Wt 135.8 lb

## 2019-03-31 DIAGNOSIS — I5022 Chronic systolic (congestive) heart failure: Secondary | ICD-10-CM | POA: Diagnosis not present

## 2019-03-31 DIAGNOSIS — Z953 Presence of xenogenic heart valve: Secondary | ICD-10-CM

## 2019-03-31 DIAGNOSIS — I4821 Permanent atrial fibrillation: Secondary | ICD-10-CM | POA: Diagnosis not present

## 2019-03-31 DIAGNOSIS — I10 Essential (primary) hypertension: Secondary | ICD-10-CM | POA: Diagnosis not present

## 2019-03-31 LAB — BASIC METABOLIC PANEL
Anion gap: 9 (ref 5–15)
BUN: 26 mg/dL — ABNORMAL HIGH (ref 8–23)
CO2: 24 mmol/L (ref 22–32)
Calcium: 9.6 mg/dL (ref 8.9–10.3)
Chloride: 104 mmol/L (ref 98–111)
Creatinine, Ser: 1.11 mg/dL — ABNORMAL HIGH (ref 0.44–1.00)
GFR calc Af Amer: 56 mL/min — ABNORMAL LOW (ref >60)
GFR calc non Af Amer: 48 mL/min — ABNORMAL LOW (ref >60)
Glucose, Bld: 91 mg/dL (ref 70–99)
Potassium: 4.2 mmol/L (ref 3.5–5.1)
Sodium: 137 mmol/L (ref 135–145)

## 2019-03-31 LAB — CBC WITH DIFFERENTIAL/PLATELET
Abs Immature Granulocytes: 0.02 10*3/uL (ref 0.00–0.07)
Basophils Absolute: 0.1 10*3/uL (ref 0.0–0.1)
Basophils Relative: 1 %
Eosinophils Absolute: 0.2 10*3/uL (ref 0.0–0.5)
Eosinophils Relative: 3 %
HCT: 37.5 % (ref 36.0–46.0)
Hemoglobin: 12.1 g/dL (ref 12.0–15.0)
Immature Granulocytes: 0 %
Lymphocytes Relative: 32 %
Lymphs Abs: 2 10*3/uL (ref 0.7–4.0)
MCH: 29.6 pg (ref 26.0–34.0)
MCHC: 32.3 g/dL (ref 30.0–36.0)
MCV: 91.7 fL (ref 80.0–100.0)
Monocytes Absolute: 0.7 10*3/uL (ref 0.1–1.0)
Monocytes Relative: 12 %
Neutro Abs: 3.2 10*3/uL (ref 1.7–7.7)
Neutrophils Relative %: 52 %
Platelets: 219 10*3/uL (ref 150–400)
RBC: 4.09 MIL/uL (ref 3.87–5.11)
RDW: 14.4 % (ref 11.5–15.5)
WBC: 6.1 10*3/uL (ref 4.0–10.5)
nRBC: 0 % (ref 0.0–0.2)

## 2019-03-31 NOTE — Progress Notes (Signed)
Cardiology Office Note   Date:  03/31/2019   ID:  Elizabeth, Hall July 16, 1940, MRN [id number]  PCP:  Ria Bush, MD  Cardiologist:   Kathlyn Sacramento, MD   Chief Complaint  Patient presents with  . other    6 month follow up. Meds reviewed by the pt. verbally. "doing well."       History of Present Illness: Elizabeth Hall is a 78 y.o. female who presents for  a follow-up visit. 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 Congestive heart failure was diagnosed in 2007.  She has known history of atrial fibrillation. She underwent AV nodal ablation and pacemaker placement in November 2013. 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 Her ejection fraction deteriorated and she underwent explantation of the previous pacemaker and implantation of biventricular ICD in March 2015. She had CVA in the past when her warfarin was held for a procedure. Since then, she has been bridged with low molecular weight heparin.  ABI was normal in 02/2016.  She had worsening symptoms of heart failure in 2018.  Echocardiogram at that time showed an EF of 35 to 40% with normal functioning mitral valve prosthesis with a mean gradient of 4 mmHg and moderately dilated left atrium.  A right and left cardiac catheterization in July of 2018 showed mild nonobstructive coronary artery disease.  Ejection fraction was 40%.  Right heart catheterization showed normal LVEDP, normal cardiac output and no significant pulmonary hypertension.  There was no evidence of left-to-right intra-atrial shunt.  There was mildly increased gradient across the mitral valve prosthesis with a mean gradient of 8 mmHg with a valve area of 1.48.  She improved significantly with medical therapy with improvement in lifestyle.   She has been  doing well with no recent chest pain or shortness of breath.  She has occasional palpitations that does not last for [place].  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  Past Medical History:  Diagnosis Date  . Bleeding of eye   . Chronic systolic heart failure ([place])    a. 11/2016 Echo: EF 35-40%, mild MR w/ nl fxning bioprosthesis, mod dil LA.  . CKD (chronic kidney disease) stage 3, GFR 30-59 ml/min    baseline Cr 1.5-2; prior saw nephrologist thought hypertensive nephropathy/renovascular disease  . CVA (cerebral infarction)    a. 2014 occipital lobe - some vision loss when coumadin held and not bridged  . Dyslipidemia   . Glaucoma   . H. pylori infection    treated  . H/O mitral valve replacement    a. 05/2012 s/p 20mm Carpentier Edwards pericardial tissue valve.  [name] Hx of rheumatic fever   . Hypertension   . Melanoma ([place]) 2006   L shoulder  . NICM (nonischemic cardiomyopathy) ([place])    a. 08/2013 s/p Biotronik Ilesto 7 HF1 BiV ICD (ser# [id number]);  b. 11/2016 Echo: EF 35-40%.  . Non-obstructive CAD    a. s/p MI 2001;  b. 12/2016 Cath: LM 20ost, OM2 60, RCA 30p/d, EF 35-45%. Nl R heart filling pressures.  . Permanent atrial fibrillation ([place])    a. 04/2012 s/p AVN & PPM placement (later upgraded to BiV ICD).  [name] Phlebitis   . Thromboembolism of upper extremity artery ([place])    a. 06/2012 h/o acute ischemia due to thromboembolism  radial and ulnar arteries when coumadin held s/p atherectomy - needs bridging if off coumadin  . Tricuspid regurgitation    a. 05/2012 s/p TV annuloplasty @ time of MVR.    Past Surgical History:  Procedure Laterality Date  . ABDOMINAL HYSTERECTOMY    . ABLATION  2013  . ATHERECTOMY  06/2012   left arm after coumadin held without bridging  . BREAST BIOPSY    . CARDIOVASCULAR STRESS TEST  03/2016   WNL  . CATARACT EXTRACTION Bilateral 2007, 2010  . COLONOSCOPY  03/2014   diverticulosis, int hem, rpt 5 yrs for h/o polyps ([place])   . defibrillator     MEDTRONIC 5086 MRI 52 CM LEAD, SERIAL # [id number]  . DEXA  07/2016   WNL  . DG ABDOMEN COMPLETE ([place] HX)  02/2013   HH, mod severe erosive gastritis, duodenitis  . GALLBLADDER SURGERY  2006  . INSERT / REPLACE / REMOVE PACEMAKER  08/2013  . MITRAL VALVE REPLACEMENT  05/2012  . RIGHT/LEFT HEART CATH AND CORONARY ANGIOGRAPHY N/A 12/29/2016   Procedure: Right/Left Heart Cath and Coronary Angiography;  Surgeon: [name], MD;  Location: [facility];  Service: Cardiovascular;  Laterality: N/A;  . TOTAL ABDOMINAL HYSTERECTOMY W/ BILATERAL SALPINGOOPHORECTOMY  1991   irregular bleeding  . TRICUSPID VALVE REPLACEMENT  05/2012  . US ECHOCARDIOGRAPHY  02/2014   EF 40%, LA marked dilation, gen LV hypokinesis, mitral prosthetic valve     Current Outpatient Medications  Medication Sig Dispense Refill  . acetaminophen (TYLENOL) 500 MG tablet Take 500-1,000 mg by mouth every 6 (six) hours as needed (for headaches/pain.).    [name] aspirin EC 81 MG tablet Take 81 mg by mouth daily.    . bumetanide (BUMEX) 0.5 MG tablet Take 1 tablet (0.5 mg total) by mouth daily. Continue to monitor weight, if you start gaining fluids, then increase back to 1 mg daily. 90 tablet 2  . carvedilol (COREG) 12.5 MG tablet Take 1 tablet (12.5 mg total) by mouth 2 (two) times daily with a meal. 180 tablet 2  . Cholecalciferol (VITAMIN D) 50 MCG (2000 UT) CAPS Take 1 capsule (2,000 Units total) by mouth daily. 30 capsule   . LUMIGAN 0.01 % SOLN Place 1 drop into both eyes at bedtime.     . Magnesium 250 MG TABS Take 250 mg by mouth daily.    . meclizine (ANTIVERT) 25 MG tablet Take 1 tablet (25 mg total) by mouth 2 (two) times daily as needed for dizziness. 180 tablet 0  . Polyethyl Glycol-Propyl Glycol (LUBRICANT EYE DROPS) 0.4-0.3 % SOLN Place 1 drop into both eyes 3 (three) times daily as needed (for dry eyes.).    [name] sacubitril-valsartan (ENTRESTO) 24-26 MG Take 1 tablet by mouth 2 (two) times  daily. 180 tablet 2  . spironolactone (ALDACTONE) 25 MG tablet Take 0.5 tablets (12.5 mg total) by mouth daily. 45 tablet 2  . vitamin E 400 UNIT capsule Take 400 Units by mouth daily.    [name] warfarin (COUMADIN) 3 MG tablet Take as directed by the coumadin clinic. 100 tablet 1   No current facility-administered medications for this visit.     Allergies:   Phytonadione, Amiodarone, Ciprofloxacin, Clarithromycin, Lisinopril, Metronidazole, and Phytonadione [vitamin k1]    Social History:  The patient  reports that she quit smoking about 29 years ago. Her smoking use included cigarettes. She has a 10.00 pack-year smoking history. She has never used smokeless tobacco. She reports current  alcohol use. She reports that she does not use drugs.   Family History:  The patient's family history includes CAD (age of onset: 37) in her father; Cancer in her cousin, cousin, and maternal aunt; Diabetes in her cousin; Heart failure in her brother and sister; Hypertension in her brother; Stroke in her brother and sister.    ROS:  Please see the history of present illness.   Otherwise, review of systems are positive for none.   All other systems are reviewed and negative.    PHYSICAL EXAM: VS:  BP 120/70 (BP Location: Left Arm, Patient Position: Sitting, Cuff Size: Normal)   Pulse 75   Temp (!) 96.9 F (36.1 C)   Ht 5\' 3"  (1.6 m)   Wt 135 lb 12 oz (61.6 kg)   LMP  (LMP Unknown) Comment: menopause age 15  BMI 24.05 kg/m  , BMI Body mass index is 24.05 kg/m. GEN: Well nourished, well developed, in no acute distress  HEENT: normal  Neck: no JVD, carotid bruits, or masses Cardiac: RRR; no murmurs, rubs, or gallops,no edema  Respiratory:  clear to auscultation bilaterally, normal work of breathing GI: soft, nontender, nondistended, + BS MS: no deformity or atrophy  Skin: warm and dry, no rash Neuro:  Strength and sensation are intact Psych: euthymic mood, full affect   EKG:  EKG ordered today. EKG  showed atrial fibrillation with biventricular pacing.  Heart rate is 75 bpm.   Recent Labs: 08/02/2018: BUN 38; Creatinine, Ser 1.13; Potassium 4.4; Sodium 140    Lipid Panel    Component Value Date/Time   CHOL 162 08/02/2018 0944   CHOL 192 06/12/2014   TRIG 44.0 08/02/2018 0944   TRIG 70 06/12/2014   HDL 60.90 08/02/2018 0944   CHOLHDL 3 08/02/2018 0944   VLDL 8.8 08/02/2018 0944   LDLCALC 92 08/02/2018 0944   LDLCALC 104 06/12/2014      Wt Readings from Last 3 Encounters:  03/31/19 135 lb 12 oz (61.6 kg)  02/08/19 133 lb 9.6 oz (60.6 kg)  12/14/18 132 lb (59.9 kg)         ASSESSMENT AND PLAN:  1.  Chronic systolic heart failure:  Most recent ejection fraction was 35-40%.    She appears to be euvolemic and currently on optimal medical therapy.  She is currently on Bumex 1 mg daily.  She is on optimal medical therapy including carvedilol, Entresto and spironolactone.  I requested routine labs.  2. Chronic atrial fibrillation: Status post AV nodal ablation.  Occasional palpitations.  She is on Serpe-term anticoagulation with warfarin.  3. Essential hypertension: Blood pressure is well controlled on current medications.  4. Status post mitral valve replacement with bioprosthetic valve: Acceptable function on most recent cardiac catheterization.  Mildly increased gradient as expected.   Disposition:   FU with me in 6 months  Signed,  Kathlyn Sacramento, MD  03/31/2019 10:51 AM    [place]

## 2019-03-31 NOTE — Patient Instructions (Addendum)
Medication Instructions:  Your physician recommends that you continue on your current medications as directed. Please refer to the Current Medication list given to you today.  If you need a refill on your cardiac medications before your next appointment, please call your pharmacy.   Lab work: Risk manager today.  Please have your labs drawn at the medical mall. Please stop at the front desk to your left to check in.  If you have labs (blood work) drawn today and your tests are completely normal, you will receive your results only by: [name] MyChart Message (if you have MyChart) OR . A paper copy in the mail If you have any lab test that is abnormal or we need to change your treatment, we will call you to review the results.  Testing/Procedures: None ordered  Follow-Up: At [facility], you and your health needs are our priority.  As part of our continuing mission to provide you with exceptional heart care, we have created designated Provider Care Teams.  These Care Teams include your primary Cardiologist (physician) and Advanced Practice Providers (APPs -  Physician Assistants and Nurse Practitioners) who all work together to provide you with the care you need, when you need it. You will need a follow up appointment in 6 months.  Please call our office 2 months in advance to schedule this appointment.  You may see Kathlyn Sacramento, MD or one of the following Advanced Practice Providers on your designated Care Team:   Murray Hodgkins, NP Christell Faith, PA-C . Marrianne Mood, PA-C  Any Other Special Instructions Will Be Listed Below (If Applicable). N/A

## 2019-04-05 ENCOUNTER — Other Ambulatory Visit: Payer: Self-pay | Admitting: Cardiovascular Disease

## 2019-04-20 ENCOUNTER — Ambulatory Visit (INDEPENDENT_AMBULATORY_CARE_PROVIDER_SITE_OTHER): Payer: Medicare Other

## 2019-04-20 ENCOUNTER — Other Ambulatory Visit: Payer: Self-pay

## 2019-04-20 DIAGNOSIS — I4891 Unspecified atrial fibrillation: Secondary | ICD-10-CM | POA: Diagnosis not present

## 2019-04-20 DIAGNOSIS — Z5181 Encounter for therapeutic drug level monitoring: Secondary | ICD-10-CM | POA: Diagnosis not present

## 2019-04-20 DIAGNOSIS — I4821 Permanent atrial fibrillation: Secondary | ICD-10-CM | POA: Diagnosis not present

## 2019-04-20 DIAGNOSIS — Z953 Presence of xenogenic heart valve: Secondary | ICD-10-CM | POA: Diagnosis not present

## 2019-04-20 LAB — POCT INR: INR: 2.8 (ref 2.0–3.0)

## 2019-04-20 NOTE — Patient Instructions (Signed)
Please continue 1 tablet every day EXCEPT 1.5 ON MONDAYS. Recheck in 6 weeks.

## 2019-05-03 DIAGNOSIS — R300 Dysuria: Secondary | ICD-10-CM | POA: Diagnosis not present

## 2019-05-03 DIAGNOSIS — N9089 Other specified noninflammatory disorders of vulva and perineum: Secondary | ICD-10-CM | POA: Diagnosis not present

## 2019-05-03 DIAGNOSIS — N952 Postmenopausal atrophic vaginitis: Secondary | ICD-10-CM | POA: Diagnosis not present

## 2019-05-03 DIAGNOSIS — R32 Unspecified urinary incontinence: Secondary | ICD-10-CM | POA: Diagnosis not present

## 2019-05-08 ENCOUNTER — Other Ambulatory Visit: Payer: Self-pay | Admitting: Cardiovascular Disease

## 2019-05-10 ENCOUNTER — Ambulatory Visit (INDEPENDENT_AMBULATORY_CARE_PROVIDER_SITE_OTHER): Payer: Medicare Other | Admitting: *Deleted

## 2019-05-10 DIAGNOSIS — I5022 Chronic systolic (congestive) heart failure: Secondary | ICD-10-CM

## 2019-05-10 DIAGNOSIS — I4821 Permanent atrial fibrillation: Secondary | ICD-10-CM

## 2019-05-11 DIAGNOSIS — H401233 Low-tension glaucoma, bilateral, severe stage: Secondary | ICD-10-CM | POA: Diagnosis not present

## 2019-05-11 LAB — CUP PACEART REMOTE DEVICE CHECK
Date Time Interrogation Session: 20201118092832
Implantable Lead Implant Date: 20150316
Implantable Lead Implant Date: 20150316
Implantable Lead Location: 753858
Implantable Lead Location: 753860
Implantable Lead Model: 346
Implantable Lead Serial Number: 25130583
Implantable Pulse Generator Implant Date: 20150316
Pulse Gen Model: 383547
Pulse Gen Serial Number: 60765179

## 2019-05-18 ENCOUNTER — Other Ambulatory Visit: Payer: Self-pay | Admitting: Cardiovascular Disease

## 2019-05-18 NOTE — Telephone Encounter (Signed)
Please advise if OK to refill. Thank you! 

## 2019-05-18 NOTE — Telephone Encounter (Signed)
Refill sent to [facility].

## 2019-05-18 NOTE — Telephone Encounter (Signed)
Dr. Fletcher Anon, this was refilled under you in June.  Is this something you are going to refill for this patient or should it be handled by her PCP?   Thanks!

## 2019-05-18 NOTE — Telephone Encounter (Signed)
Yes that is fine we can refill.  It is a benign medication.

## 2019-05-30 ENCOUNTER — Ambulatory Visit (INDEPENDENT_AMBULATORY_CARE_PROVIDER_SITE_OTHER): Payer: Medicare Other

## 2019-05-30 ENCOUNTER — Other Ambulatory Visit: Payer: Self-pay

## 2019-05-30 DIAGNOSIS — Z953 Presence of xenogenic heart valve: Secondary | ICD-10-CM

## 2019-05-30 DIAGNOSIS — I4821 Permanent atrial fibrillation: Secondary | ICD-10-CM

## 2019-05-30 DIAGNOSIS — I4891 Unspecified atrial fibrillation: Secondary | ICD-10-CM

## 2019-05-30 DIAGNOSIS — Z5181 Encounter for therapeutic drug level monitoring: Secondary | ICD-10-CM | POA: Diagnosis not present

## 2019-05-30 LAB — POCT INR: INR: 2.6 (ref 2.0–3.0)

## 2019-05-30 NOTE — Patient Instructions (Signed)
Please continue 1 tablet every day EXCEPT 1.5 ON MONDAYS. Recheck in 6 weeks.

## 2019-06-01 DIAGNOSIS — N952 Postmenopausal atrophic vaginitis: Secondary | ICD-10-CM | POA: Diagnosis not present

## 2019-06-01 DIAGNOSIS — R399 Unspecified symptoms and signs involving the genitourinary system: Secondary | ICD-10-CM | POA: Diagnosis not present

## 2019-06-05 ENCOUNTER — Other Ambulatory Visit: Payer: Self-pay | Admitting: Internal Medicine

## 2019-06-07 ENCOUNTER — Other Ambulatory Visit: Payer: Self-pay

## 2019-06-07 ENCOUNTER — Telehealth: Payer: Self-pay | Admitting: Cardiovascular Disease

## 2019-06-07 ENCOUNTER — Telehealth: Payer: Self-pay

## 2019-06-07 DIAGNOSIS — Z86010 Personal history of colonic polyps: Secondary | ICD-10-CM

## 2019-06-07 NOTE — Telephone Encounter (Signed)
Gastroenterology Pre-Procedure Review  Request Date: Tuesday 07/12/19 Requesting Physician: Dr. [name]  PATIENT REVIEW QUESTIONS: The patient responded to the following health history questions as indicated:    1. Are you having any GI issues? no 2. Do you have a personal history of Polyps? yes (last colon with Dr. [name]) 3. Do you have a family history of Colon Cancer or Polyps? yes (first cousin had colon cancer) 4. Diabetes Mellitus? no 5. Joint replacements in the past 12 months?no 6. Major health problems in the past 3 months?no 7. Any artificial heart valves, MVP, or defibrillator?yes (Pacemaker, Pig Valve, Defibrillator)    MEDICATIONS & ALLERGIES:    Patient reports the following regarding taking any anticoagulation/antiplatelet therapy:   Plavix, Coumadin, Eliquis, Xarelto, Lovenox, Pradaxa, Brilinta, or Effient? yes (Warfarin prescribed by Dr. Fletcher Anon) Aspirin? yes (81 mg daily)  Patient confirms/reports the following medications:  Current Outpatient Medications  Medication Sig Dispense Refill  . warfarin (JANTOVEN) 3 MG tablet TAKE AS DIRECTED BY THE    COUMADIN CLINIC 100 tablet 1  . acetaminophen (TYLENOL) 500 MG tablet Take 500-1,000 mg by mouth every 6 (six) hours as needed (for headaches/pain.).    [name] aspirin EC 81 MG tablet Take 81 mg by mouth daily.    . bumetanide (BUMEX) 0.5 MG tablet TAKE 1 TABLET DAILY. CONTINUE TO MONITOR WEIGHT, IF YOU START GAINING FLUIDS, THEN INCREASE BACK TO 1MG  DAILY 90 tablet 2  . carvedilol (COREG) 12.5 MG tablet TAKE 1 TABLET TWICE A DAY  WITH MEALS 180 tablet 1  . Cholecalciferol (VITAMIN D) 50 MCG (2000 UT) CAPS Take 1 capsule (2,000 Units total) by mouth daily. 30 capsule   . LUMIGAN 0.01 % SOLN Place 1 drop into both eyes at bedtime.     . Magnesium 250 MG TABS Take 250 mg by mouth daily.    . meclizine (ANTIVERT) 25 MG tablet TAKE 1 TABLET TWICE A DAY  AS NEEDED FOR DIZZINESS 180 tablet 1  . Polyethyl Glycol-Propyl Glycol (LUBRICANT EYE  DROPS) 0.4-0.3 % SOLN Place 1 drop into both eyes 3 (three) times daily as needed (for dry eyes.).    [name] sacubitril-valsartan (ENTRESTO) 24-26 MG Take 1 tablet by mouth 2 (two) times daily. 180 tablet 2  . spironolactone (ALDACTONE) 25 MG tablet Take 0.5 tablets (12.5 mg total) by mouth daily. 45 tablet 2  . vitamin E 400 UNIT capsule Take 400 Units by mouth daily.     No current facility-administered medications for this visit.    Patient confirms/reports the following allergies:  Allergies  Allergen Reactions  . Phytonadione Other (See Comments)  . Amiodarone Other (See Comments)    "Irritable"--IV INFUSION   . Ciprofloxacin Rash    Rash while on biaxin, cipro, flagyl (unsure which was inciting agent)  . Clarithromycin Rash    Rash while on biaxin, cipro, flagyl (unsure which was inciting agent)  . Lisinopril Rash  . Metronidazole Rash    Rash while on biaxin, cipro, flagyl (unsure which was inciting agent)  . Phytonadione [Vitamin K1] Other (See Comments)    "extremely faint"--IV INFUSION    No orders of the defined types were placed in this encounter.   AUTHORIZATION INFORMATION Primary Insurance: 1D#: Group #:  Secondary Insurance: 1D#: Group #:  SCHEDULE INFORMATION: Date:  Time: Location:

## 2019-06-07 NOTE — Progress Notes (Signed)
Remote ICD transmission.   

## 2019-06-07 NOTE — Telephone Encounter (Signed)
   [facility] Pre-operative Risk Assessment    Request for surgical clearance:  1. What type of surgery is being performed? Colonoscopy   2. When is this surgery scheduled? 07/12/19   3. What type of clearance is required (medical clearance vs. Pharmacy clearance to hold med vs. Both)? both  4. Are there any medications that need to be held prior to surgery and how Steffy? Warfarin instructions - may stop how many days prior and how many days after procedure to restart  5. Practice name and name of physician performing surgery? [facility], Dr [name]  6. What is your office phone number [phone]   7.   What is your office fax number [phone] ATTN: Driscilla Grammes  8.   Anesthesia type (None, local, MAC, general) ? Not listed    Ace Gins 06/07/2019, 4:44 PM  _________________________________________________________________   (provider comments below)

## 2019-06-08 NOTE — Telephone Encounter (Signed)
Patient with diagnosis of afib on warfarin for anticoagulation.    Procedure: Colonoscopy Date of procedure: 07/12/2019  CHADS2-VASc score of  8 (CHF, HTN, AGE,  stroke/tia x 2, CAD, AGE, female)  CrCl 39ml/min Platelet count 219  Per office protocol, patient can hold warfarin for 5 days prior to procedure.    Patient WILL need bridging with Lovenox (enoxaparin) around procedure.

## 2019-06-08 NOTE — Telephone Encounter (Signed)
   Primary Cardiologist: Kathlyn Sacramento, MD  Chart reviewed as part of pre-operative protocol coverage. Given past medical history and time since last visit, based on ACC/AHA guidelines, Elizabeth Hall would be at acceptable risk for the planned procedure without further cardiovascular testing.   Per pharmacy, patient with diagnosis of afib on warfarin for anticoagulation.    Procedure: Colonoscopy Date of procedure: 07/12/2019  CHADS2-VASc score of  8 (CHF, HTN, AGE,  stroke/tia x 2, CAD, AGE, female)  CrCl 85ml/min Platelet count 219  Per office protocol, patient can hold warfarin for 5 days prior to procedure.    Patient WILL need bridging with Lovenox (enoxaparin) around procedure.  I will route this recommendation to the requesting party via Epic fax function and remove from pre-op pool.  Please call with questions.  Kathyrn Drown, NP 06/08/2019, 9:21 AM

## 2019-06-15 ENCOUNTER — Telehealth: Payer: Self-pay

## 2019-06-15 NOTE — Telephone Encounter (Signed)
Pt request info about covid vaccine and pt wants to know if the vaccine is a live virus. I advised pt we do not have the covid vaccine and do not have any update on if we will get the vaccine; pt has heart issues and said the cardiologist referred pt to PCP. Pt will call health dept to see if they know if vaccine is live virus. FYI to Dr Darnell Level.

## 2019-06-15 NOTE — Telephone Encounter (Signed)
Vaccine is not a live virus.  Do recommend she get it when available. Currently not available yet.

## 2019-06-15 NOTE — Telephone Encounter (Signed)
Spoke with pt relaying Dr. G's message.  Verbalizes understanding.  

## 2019-06-24 DIAGNOSIS — K5792 Diverticulitis of intestine, part unspecified, without perforation or abscess without bleeding: Secondary | ICD-10-CM

## 2019-06-24 HISTORY — DX: Diverticulitis of intestine, part unspecified, without perforation or abscess without bleeding: K57.92

## 2019-06-28 ENCOUNTER — Other Ambulatory Visit: Payer: Self-pay | Admitting: Cardiovascular Disease

## 2019-07-01 ENCOUNTER — Telehealth: Payer: Self-pay

## 2019-07-01 NOTE — Telephone Encounter (Signed)
Pt has been advised regarding blood thinner prior to her 07/12/19 colonoscopy per Dr. Kathlyn Sacramento.  Pt advised to hold warfarin 5 days prior to procedure.  She will need bridging with Lovenox around procedure.  Blood Thinner request received on 06/07/20 at 09:02am from Kathyrn Drown, NP  Thanks Sharyn Lull

## 2019-07-04 ENCOUNTER — Other Ambulatory Visit: Payer: Self-pay

## 2019-07-04 ENCOUNTER — Ambulatory Visit (INDEPENDENT_AMBULATORY_CARE_PROVIDER_SITE_OTHER): Payer: Medicare Other

## 2019-07-04 ENCOUNTER — Telehealth: Payer: Self-pay

## 2019-07-04 DIAGNOSIS — Z953 Presence of xenogenic heart valve: Secondary | ICD-10-CM | POA: Diagnosis not present

## 2019-07-04 DIAGNOSIS — Z5181 Encounter for therapeutic drug level monitoring: Secondary | ICD-10-CM | POA: Diagnosis not present

## 2019-07-04 DIAGNOSIS — I4891 Unspecified atrial fibrillation: Secondary | ICD-10-CM

## 2019-07-04 DIAGNOSIS — I4821 Permanent atrial fibrillation: Secondary | ICD-10-CM | POA: Diagnosis not present

## 2019-07-04 LAB — POCT INR: INR: 3 (ref 2.0–3.0)

## 2019-07-04 MED ORDER — GOLYTELY 236 G PO SOLR: 4000.0000 mL | Freq: Once | ORAL | 0 refills | Status: AC

## 2019-07-04 MED ORDER — ENOXAPARIN SODIUM 60 MG/0.6ML ~~LOC~~ SOLN: 60.0000 mg | Freq: Two times a day (BID) | SUBCUTANEOUS | 1 refills | Status: DC

## 2019-07-04 NOTE — Patient Instructions (Signed)
Wednesday, Jan 13: Last dose of Coumadin.  Thursday, Jan 14: No Coumadin or Lovenox.  Friday, Jan 15: Inject Lovenox 60mg  in the fatty abdominal tissue at least 2 inches from the belly button twice a day about 12 hours apart, 8am and 8pm rotate sites. No Coumadin.  Saturday, Jan 16: Inject Lovenox in the fatty tissue every 12 hours, 8am and 8pm. No Coumadin.  Sunday, Jan 17: Inject Lovenox in the fatty tissue every 12 hours, 8am and 8pm. No Coumadin.  Monday, Jan 18: Inject Lovenox in the fatty tissue in the morning at 8 am (No PM dose). No Coumadin.  Tuesday, Jan 19: Procedure Day - No Lovenox - Resume Coumadin in the evening or as directed by doctor (take an extra half tablet with usual dose for 2 days then resume normal dose).  Wednesday, Jan 20: Resume Lovenox inject in the fatty tissue every 12 hours and take Coumadin w/ extra 1/2 tablet.  Thursday, Jan 21: Inject Lovenox in the fatty tissue every 12 hours and take Coumadin.  Friday, Jan 22: Inject Lovenox in the fatty tissue every 12 hours and take Coumadin.  Saturday, Jan 23: Inject Lovenox in the fatty tissue every 12 hours and take Coumadin.  Sunday, Jan 24: Inject Lovenox in the fatty tissue every 12 hours and take Coumadin.  Monday, Jan 25: Coumadin appt to check INR.

## 2019-07-04 NOTE — Telephone Encounter (Signed)
Patient contacted the office and stated that she has been having stabbing pains in her lower abdomen, and has been having severe cramps. She states these pains come and go when she walks, and even sometimes while she is laying down. Patient states she has noticed this has been going on since November. Patient states she has tried to reach out to her GYN but they can not see her for months, and she is wondering if Dr. Darnell Level can see her, and if she can have an ultrasound ordered for her to have done at an UC?

## 2019-07-04 NOTE — Telephone Encounter (Signed)
Patient has been scheduled for tomorrow morning at 10:15.

## 2019-07-04 NOTE — Telephone Encounter (Signed)
plz schedule office visit asap in office.

## 2019-07-05 ENCOUNTER — Encounter: Payer: Self-pay | Admitting: Family Medicine

## 2019-07-05 ENCOUNTER — Ambulatory Visit (INDEPENDENT_AMBULATORY_CARE_PROVIDER_SITE_OTHER): Payer: Medicare Other | Admitting: Family Medicine

## 2019-07-05 ENCOUNTER — Other Ambulatory Visit: Payer: Self-pay | Admitting: Family Medicine

## 2019-07-05 ENCOUNTER — Telehealth: Payer: Self-pay

## 2019-07-05 ENCOUNTER — Ambulatory Visit
Admission: RE | Admit: 2019-07-05 | Discharge: 2019-07-05 | Disposition: A | Discharge status: Home / Self Care | Payer: Medicare Other | Source: Ambulatory Visit | Attending: Family Medicine | Admitting: Family Medicine

## 2019-07-05 VITALS — BP 118/72 | HR 78 | Temp 97.8°F | Ht 63.0 in | Wt 138.4 lb

## 2019-07-05 DIAGNOSIS — R1033 Periumbilical pain: Secondary | ICD-10-CM | POA: Diagnosis not present

## 2019-07-05 DIAGNOSIS — K5732 Diverticulitis of large intestine without perforation or abscess without bleeding: Secondary | ICD-10-CM | POA: Diagnosis not present

## 2019-07-05 DIAGNOSIS — R1032 Left lower quadrant pain: Secondary | ICD-10-CM | POA: Diagnosis not present

## 2019-07-05 DIAGNOSIS — N3020 Other chronic cystitis without hematuria: Secondary | ICD-10-CM | POA: Insufficient documentation

## 2019-07-05 LAB — CBC WITH DIFFERENTIAL/PLATELET
Basophils Absolute: 0 10*3/uL (ref 0.0–0.1)
Basophils Relative: 0.7 % (ref 0.0–3.0)
Eosinophils Absolute: 0.3 10*3/uL (ref 0.0–0.7)
Eosinophils Relative: 3.8 % (ref 0.0–5.0)
HCT: 33.2 % — ABNORMAL LOW (ref 36.0–46.0)
Hemoglobin: 11.1 g/dL — ABNORMAL LOW (ref 12.0–15.0)
Lymphocytes Relative: 23.4 % (ref 12.0–46.0)
Lymphs Abs: 1.7 10*3/uL (ref 0.7–4.0)
MCHC: 33.4 g/dL (ref 30.0–36.0)
MCV: 90.3 fl (ref 78.0–100.0)
Monocytes Absolute: 0.8 10*3/uL (ref 0.1–1.0)
Monocytes Relative: 11.8 % (ref 3.0–12.0)
Neutro Abs: 4.3 10*3/uL (ref 1.4–7.7)
Neutrophils Relative %: 60.3 % (ref 43.0–77.0)
Platelets: 269 10*3/uL (ref 150.0–400.0)
RBC: 3.67 Mil/uL — ABNORMAL LOW (ref 3.87–5.11)
RDW: 14.5 % (ref 11.5–15.5)
WBC: 7.1 10*3/uL (ref 4.0–10.5)

## 2019-07-05 LAB — COMPREHENSIVE METABOLIC PANEL
ALT: 8 U/L (ref 0–35)
AST: 14 U/L (ref 0–37)
Albumin: 4.3 g/dL (ref 3.5–5.2)
Alkaline Phosphatase: 42 U/L (ref 39–117)
BUN: 25 mg/dL — ABNORMAL HIGH (ref 6–23)
CO2: 24 mEq/L (ref 19–32)
Calcium: 9.7 mg/dL (ref 8.4–10.5)
Chloride: 99 mEq/L (ref 96–112)
Creatinine, Ser: 1 mg/dL (ref 0.40–1.20)
GFR: 53.75 mL/min — ABNORMAL LOW (ref >60.00)
Glucose, Bld: 102 mg/dL — ABNORMAL HIGH (ref 70–99)
Potassium: 4.2 mEq/L (ref 3.5–5.1)
Sodium: 132 mEq/L — ABNORMAL LOW (ref 135–145)
Total Bilirubin: 0.5 mg/dL (ref 0.2–1.2)
Total Protein: 7.3 g/dL (ref 6.0–8.3)

## 2019-07-05 LAB — POC URINALSYSI DIPSTICK (AUTOMATED)
Bilirubin, UA: NEGATIVE
Blood, UA: NEGATIVE
Glucose, UA: NEGATIVE
Ketones, UA: NEGATIVE
Leukocytes, UA: NEGATIVE
Nitrite, UA: NEGATIVE
Protein, UA: NEGATIVE
Spec Grav, UA: 1.015 (ref 1.010–1.025)
Urobilinogen, UA: 0.2 E.U./dL
pH, UA: 6 (ref 5.0–8.0)

## 2019-07-05 LAB — LIPASE: Lipase: 23 U/L (ref 11.0–59.0)

## 2019-07-05 MED ORDER — IOHEXOL 300 MG/ML  SOLN
100.0000 mL | Freq: Once | INTRAMUSCULAR | Status: AC | PRN
  Administered 2019-07-05: 100 mL via INTRAVENOUS

## 2019-07-05 MED ORDER — AMOXICILLIN-POT CLAVULANATE 875-125 MG PO TABS: 1.0000 | ORAL_TABLET | Freq: Two times a day (BID) | ORAL | 0 refills | Status: AC

## 2019-07-05 NOTE — Progress Notes (Signed)
This visit was conducted in person.  BP 118/72 (BP Location: Left Arm, Patient Position: Sitting, Cuff Size: Normal)   Pulse 78   Temp 97.8 F (36.6 C) (Temporal)   Ht 5\' 3"  (1.6 m)   Wt 138 lb 7 oz (62.8 kg)   LMP  (LMP Unknown) Comment: menopause age 79  SpO2 98%   BMI 24.52 kg/m    CC: abd pain Subjective:    Patient ID: Elizabeth Hall, female    DOB: [birth year], 79 y.o.   MRN: [id number]  HPI: Elizabeth Hall is a 79 y.o. female presenting on 07/05/2019 for Abdominal Pain (C/o low abd pain.  Started about 3 mos ago.  Treated x2 for UTI.  Tx with abx, no help.  Denies N/V/D. )   3 mo h/o lower abdominal pain especially LLQ, describes both sharp and dull ache. Soft stools, increased quantity of stools.  Not worse with foods. Not better with BM.  No new foods. No sick contacts at home.   No fevers/chills, dysuria, urgency, flank pain, nausea/vomiting, diarrhea/constipation, blood in stool, blood in urine.   Saw GYN 04/2019 - UA/culture showed UTI, treated with bactrim DS 5d course. She was also treated with pyridium 200mg  without benefit. Returned 05/2019 with GYN, UA/culture again with signs of UTI treated with 10d macrobid course - without improvement. Also started on estradiol cream.   Upcoming colonoscopy scheduled for next week. To undergo lovenox bridge.   S/p TAH with BSO 1991.  COLONOSCOPY 03/2014 - diverticulosis, int hem, rpt 5 yrs for h/o polyps ([place]).   Has started working part time [employer].      Relevant past medical, surgical, family and social history reviewed and updated as indicated. Interim medical history since our last visit reviewed. Allergies and medications reviewed and updated. Outpatient Medications Prior to Visit  Medication Sig Dispense Refill  . acetaminophen (TYLENOL) 500 MG tablet Take 500-1,000 mg by mouth every 6 (six) hours as needed (for headaches/pain.).    [name] aspirin EC 81 MG tablet Take 81 mg by mouth daily.    .  bumetanide (BUMEX) 0.5 MG tablet TAKE 1 TABLET DAILY. CONTINUE TO MONITOR WEIGHT, IF YOU START GAINING FLUIDS, THEN INCREASE BACK TO 1MG  DAILY 90 tablet 2  . carvedilol (COREG) 12.5 MG tablet TAKE 1 TABLET TWICE A DAY  WITH MEALS 180 tablet 1  . Cholecalciferol (VITAMIN D) 50 MCG (2000 UT) CAPS Take 1 capsule (2,000 Units total) by mouth daily. 30 capsule   . ENTRESTO 24-26 MG TAKE 1 TABLET BY MOUTH TWICE A DAY 180 tablet 0  . LUMIGAN 0.01 % SOLN Place 1 drop into both eyes at bedtime.     . Magnesium 250 MG TABS Take 250 mg by mouth daily.    . meclizine (ANTIVERT) 25 MG tablet TAKE 1 TABLET TWICE A DAY  AS NEEDED FOR DIZZINESS 180 tablet 1  . Polyethyl Glycol-Propyl Glycol (LUBRICANT EYE DROPS) 0.4-0.3 % SOLN Place 1 drop into both eyes 3 (three) times daily as needed (for dry eyes.).    [name] spironolactone (ALDACTONE) 25 MG tablet Take 0.5 tablets (12.5 mg total) by mouth daily. 45 tablet 2  . vitamin E 400 UNIT capsule Take 400 Units by mouth daily.    [name] warfarin (JANTOVEN) 3 MG tablet TAKE AS DIRECTED BY THE    COUMADIN CLINIC 100 tablet 1  . enoxaparin (LOVENOX) 60 MG/0.6ML injection Inject 0.6 mLs (60 mg total) into the skin every 12 (twelve) hours. (Patient  not taking: Reported on 07/05/2019) 12 mL 1   No facility-administered medications prior to visit.     Per HPI unless specifically indicated in ROS section below Review of Systems Objective:    BP 118/72 (BP Location: Left Arm, Patient Position: Sitting, Cuff Size: Normal)   Pulse 78   Temp 97.8 F (36.6 C) (Temporal)   Ht 5\' 3"  (1.6 m)   Wt 138 lb 7 oz (62.8 kg)   LMP  (LMP Unknown) Comment: menopause age 41  SpO2 98%   BMI 24.52 kg/m   Wt Readings from Last 3 Encounters:  07/05/19 138 lb 7 oz (62.8 kg)  03/31/19 135 lb 12 oz (61.6 kg)  02/08/19 133 lb 9.6 oz (60.6 kg)    Physical Exam Vitals and nursing note reviewed.  Constitutional:      Appearance: Normal appearance. She is not ill-appearing.  HENT:      Mouth/Throat:     Mouth: Mucous membranes are moist.     Pharynx: Oropharynx is clear. No posterior oropharyngeal erythema.  Cardiovascular:     Rate and Rhythm: Normal rate and regular rhythm.     Pulses: Normal pulses.     Heart sounds: Normal heart sounds. No murmur.  Pulmonary:     Effort: Pulmonary effort is normal. No respiratory distress.     Breath sounds: Normal breath sounds. No wheezing, rhonchi or rales.  Abdominal:     General: Abdomen is flat. Bowel sounds are normal. There is no distension.     Palpations: Abdomen is soft. There is no mass.     Tenderness: There is abdominal tenderness in the suprapubic area and left lower quadrant. There is rebound. There is no right CVA tenderness, left CVA tenderness or guarding.     Hernia: No hernia is present.  Musculoskeletal:     Right lower leg: No edema.     Left lower leg: No edema.  Skin:    Findings: No rash.  Neurological:     Mental Status: She is alert.  Psychiatric:        Mood and Affect: Mood normal.        Behavior: Behavior normal.       Results for orders placed or performed in visit on 07/05/19  POCT Urinalysis Dipstick (Automated)  Result Value Ref Range   Color, UA yellow    Clarity, UA clear    Glucose, UA Negative Negative   Bilirubin, UA negative    Ketones, UA negative    Spec Grav, UA 1.015 1.010 - 1.025   Blood, UA negative    pH, UA 6.0 5.0 - 8.0   Protein, UA Negative Negative   Urobilinogen, UA 0.2 0.2 or 1.0 E.U./dL   Nitrite, UA negative    Leukocytes, UA Negative Negative   Assessment & Plan:  This visit occurred during the SARS-CoV-2 public health emergency.  Safety protocols were in place, including screening questions prior to the visit, additional usage of staff PPE, and extensive cleaning of exam room while observing appropriate contact time as indicated for disinfecting solutions.   Problem List Items Addressed This Visit    LLQ abdominal pain - Primary    Known diverticulosis  by colonoscopy 2015. UA normal today. S/p hysterectomy, cholecystectomy, BSO. Suspicious for ongoing diverticulitis given LLQ pain. With + rebound, will send for CT scan to further evaluate intra abdominal pathology. Check labs today. If + diverticulitis, will need to postpone colonoscopy next week. rec bland diet for now. Pt agrees  with plan.       Relevant Orders   Comprehensive metabolic panel   CBC with Differential   Lipase   CT Abdomen Pelvis W Contrast    Other Visit Diagnoses    Periumbilical abdominal pain       Relevant Orders   POCT Urinalysis Dipstick (Automated) (Completed)       No orders of the defined types were placed in this encounter.  Orders Placed This Encounter  Procedures  . CT Abdomen Pelvis W Contrast    Low dose contrast due to CKD    Standing Status:   Future    Standing Expiration Date:   10/02/2020    Order Specific Question:   ** REASON FOR EXAM (FREE TEXT)    Answer:   LLQ abd pain despite abx course    Order Specific Question:   If indicated for the ordered procedure, I authorize the administration of contrast media per Radiology protocol    Answer:   Yes    Order Specific Question:   Preferred imaging location?    Answer:   [facility]    Order Specific Question:   Is Oral Contrast requested for this exam?    Answer:   Yes, Per Radiology protocol    Order Specific Question:   Call Results- Best Contact Number?    Answer:   [phone]    Order Specific Question:   Radiology Contrast Protocol - do NOT remove file path    Answer:   \\[email or web address]  . Comprehensive metabolic panel  . CBC with Differential  . Lipase  . POCT Urinalysis Dipstick (Automated)    Patient Instructions  Labs today. Urine looking ok today.  I'm suspicious for diverticulitis - see Rosaria Ferries to schedule CT scan. Bland low fiber diet for now.    Follow up plan: Return if symptoms worsen or fail to improve.  Ria Bush, MD

## 2019-07-05 NOTE — Patient Instructions (Addendum)
Labs today. Urine looking ok today.  I'm suspicious for diverticulitis - see Rosaria Ferries to schedule CT scan. Bland low fiber diet for now.

## 2019-07-05 NOTE — Assessment & Plan Note (Addendum)
Known diverticulosis by colonoscopy 2015. UA normal today. S/p hysterectomy, cholecystectomy, BSO. Suspicious for ongoing diverticulitis given LLQ pain. With + rebound, will send for CT scan to further evaluate intra abdominal pathology. Check labs today. If + diverticulitis, will need to postpone colonoscopy next week. rec bland diet for now. Pt agrees with plan.

## 2019-07-05 NOTE — Telephone Encounter (Signed)
Colletta Maryland is calling with Call Report from [place].  States pt has diverticulitis of the proximal sigmoid portion of the colon and she left because she had to pick up rx for her husband. Fyi to Dr. Darnell Level.

## 2019-07-06 ENCOUNTER — Telehealth: Payer: Self-pay

## 2019-07-06 NOTE — Telephone Encounter (Signed)
Pt has contacted office to cancel her colonoscopy due to recent diagnosis of diverticulitis.  Colonoscopy has been canceled with Trish in Endo and referral has been noted.  Thanks [employer]

## 2019-07-06 NOTE — Telephone Encounter (Signed)
Patient calling in stating her colonoscopy has been cancelled for 1/19. She wanted to call in due to her medications being adjusted. Patient already received her Lovenox. Patient would like to be called back regarding what her correct coumadin dosage should be

## 2019-07-06 NOTE — Telephone Encounter (Signed)
Returned patients call.  She left me a voice message to call her because she had questions about her colonoscopy scheduled for Tuesday 19th.  I left her a voice message asking her to call me back to address her questions.  Thanks [employer]

## 2019-07-06 NOTE — Telephone Encounter (Signed)
Spoke w/ pt.  She reports that she was dx w/ diverticulitis, put on augmentin and her colonoscopy has been cancelled.   She was given Lovenox bridging instructions on Monday, 07/04/19 - she has not held warfarin or started Lovenox yet, so advised her to continue current warfarin dosage, have a serving of vit k so her INR does not go over 3.0, and keep appt w/ me on 07/18/19 since she is on augmentin.  She is agreeable and appreciative of the call.

## 2019-07-08 ENCOUNTER — Ambulatory Visit: Payer: Medicare Other | Admitting: Family Medicine

## 2019-07-11 ENCOUNTER — Other Ambulatory Visit: Payer: Self-pay | Admitting: Cardiovascular Disease

## 2019-07-12 ENCOUNTER — Encounter: Admission: RE | Payer: Self-pay | Source: Home / Self Care

## 2019-07-12 ENCOUNTER — Ambulatory Visit: Admission: RE | Admit: 2019-07-12 | Payer: Medicare Other | Source: Home / Self Care | Admitting: Gastroenterology

## 2019-07-12 SURGERY — COLONOSCOPY WITH PROPOFOL
Anesthesia: General

## 2019-07-18 ENCOUNTER — Ambulatory Visit (INDEPENDENT_AMBULATORY_CARE_PROVIDER_SITE_OTHER): Payer: Medicare Other

## 2019-07-18 ENCOUNTER — Other Ambulatory Visit: Payer: Self-pay

## 2019-07-18 DIAGNOSIS — I4891 Unspecified atrial fibrillation: Secondary | ICD-10-CM

## 2019-07-18 DIAGNOSIS — I4821 Permanent atrial fibrillation: Secondary | ICD-10-CM

## 2019-07-18 DIAGNOSIS — Z5181 Encounter for therapeutic drug level monitoring: Secondary | ICD-10-CM | POA: Diagnosis not present

## 2019-07-18 DIAGNOSIS — Z953 Presence of xenogenic heart valve: Secondary | ICD-10-CM

## 2019-07-18 LAB — POCT INR: INR: 2.8 (ref 2.0–3.0)

## 2019-07-18 NOTE — Patient Instructions (Signed)
Please continue current warfarin dosage of 3 mg every day except 4.5 mg on Mondays.  Recheck in 4 weeks.  Maybe you will have a date for your colonoscopy by then.

## 2019-07-20 ENCOUNTER — Telehealth: Payer: Self-pay | Admitting: Cardiovascular Disease

## 2019-07-20 ENCOUNTER — Telehealth: Payer: Self-pay

## 2019-07-20 ENCOUNTER — Other Ambulatory Visit: Payer: Self-pay

## 2019-07-20 DIAGNOSIS — Z86010 Personal history of colonic polyps: Secondary | ICD-10-CM

## 2019-07-20 NOTE — Telephone Encounter (Signed)
Patient wanted to let you know her colonoscopy is Monday Marc 29, 2021 at [facility]

## 2019-07-20 NOTE — Telephone Encounter (Signed)
Noted.  Will give new Lovenox bridging instructions at next INR check on 08/15/19.

## 2019-07-20 NOTE — Telephone Encounter (Signed)
Gastroenterology Pre-Procedure Review  Request Date: Monday 09/19/19 Requesting Physician: Dr. [name]  PATIENT REVIEW QUESTIONS: The patient responded to the following health history questions as indicated:    1. Are you having any GI issues? no 2. Do you have a personal history of Polyps? yes (unsure of the year) 3. Do you have a family history of Colon Cancer or Polyps? yes (cousin) 4. Diabetes Mellitus? no 5. Joint replacements in the past 12 months?no 6. Major health problems in the past 3 months?no 7. Any artificial heart valves, MVP, or defibrillator?yes (pacemaker and defibrillator)    MEDICATIONS & ALLERGIES:    Patient reports the following regarding taking any anticoagulation/antiplatelet therapy:   Plavix, Coumadin, Eliquis, Xarelto, Lovenox, Pradaxa, Brilinta, or Effient? yes (warfarin-blood thinner request sent to Dr. Fletcher Anon) Aspirin? no  Patient confirms/reports the following medications:  Current Outpatient Medications  Medication Sig Dispense Refill  . acetaminophen (TYLENOL) 500 MG tablet Take 500-1,000 mg by mouth every 6 (six) hours as needed (for headaches/pain.).    [name] aspirin EC 81 MG tablet Take 81 mg by mouth daily.    . bumetanide (BUMEX) 0.5 MG tablet TAKE 1 TABLET DAILY. CONTINUE TO MONITOR WEIGHT, IF YOU START GAINING FLUIDS, THEN INCREASE BACK TO 1MG  DAILY 90 tablet 2  . carvedilol (COREG) 12.5 MG tablet TAKE 1 TABLET TWICE A DAY  WITH MEALS 180 tablet 1  . Cholecalciferol (VITAMIN D) 50 MCG (2000 UT) CAPS Take 1 capsule (2,000 Units total) by mouth daily. 30 capsule   . enoxaparin (LOVENOX) 60 MG/0.6ML injection Inject 0.6 mLs (60 mg total) into the skin every 12 (twelve) hours. (Patient not taking: Reported on 07/05/2019) 12 mL 1  . ENTRESTO 24-26 MG TAKE 1 TABLET BY MOUTH TWICE A DAY 180 tablet 0  . LUMIGAN 0.01 % SOLN Place 1 drop into both eyes at bedtime.     . Magnesium 250 MG TABS Take 250 mg by mouth daily.    . meclizine (ANTIVERT) 25 MG tablet TAKE 1  TABLET TWICE A DAY  AS NEEDED FOR DIZZINESS 180 tablet 1  . Polyethyl Glycol-Propyl Glycol (LUBRICANT EYE DROPS) 0.4-0.3 % SOLN Place 1 drop into both eyes 3 (three) times daily as needed (for dry eyes.).    [name] spironolactone (ALDACTONE) 25 MG tablet TAKE 1/2 TABLET (12.5MG      TOTAL) DAILY 45 tablet 3  . vitamin E 400 UNIT capsule Take 400 Units by mouth daily.    [name] warfarin (JANTOVEN) 3 MG tablet TAKE AS DIRECTED BY THE    COUMADIN CLINIC 100 tablet 1   No current facility-administered medications for this visit.    Patient confirms/reports the following allergies:  Allergies  Allergen Reactions  . Phytonadione Other (See Comments)  . Amiodarone Other (See Comments)    "Irritable"--IV INFUSION   . Ciprofloxacin Rash    Rash while on biaxin, cipro, flagyl (unsure which was inciting agent)  . Clarithromycin Rash    Rash while on biaxin, cipro, flagyl (unsure which was inciting agent)  . Lisinopril Rash  . Metronidazole Rash    Rash while on biaxin, cipro, flagyl (unsure which was inciting agent)  . Phytonadione [Vitamin K1] Other (See Comments)    "extremely faint"--IV INFUSION    No orders of the defined types were placed in this encounter.   AUTHORIZATION INFORMATION Primary Insurance: 1D#: Group #:  Secondary Insurance: 1D#: Group #:  SCHEDULE INFORMATION: Date:  Time: Location:

## 2019-07-21 ENCOUNTER — Telehealth: Payer: Self-pay | Admitting: Cardiovascular Disease

## 2019-07-21 NOTE — Telephone Encounter (Signed)
   Primary Cardiologist: Kathlyn Sacramento, MD  Chart reviewed as part of pre-operative protocol coverage.  Seems patient previously cleared for colonoscopy. I have left voice mail to call back to go over symptoms and review procedure details.    [place], [place] 07/21/2019, 9:14 AM

## 2019-07-21 NOTE — Telephone Encounter (Signed)
Pt takes warfarin for afib with CHADS2VASc score of 8 (age x2, sex, CHF, HTN, CAD, stroke). She will require bridging with Lovenox in order to hold warfarin for 5 days before colonoscopy.  This will be coordinated in [facility] where pt is followed.

## 2019-07-21 NOTE — Telephone Encounter (Signed)
   [facility] Pre-operative Risk Assessment    Request for surgical clearance:  1. What type of surgery is being performed? COLONOSCOPY  2. When is this surgery scheduled? 09/19/19  What type of clearance is required (medical clearance vs. Pharmacy clearance to hold med vs. Both)? BOTH 3. Are there any medications that need to be held prior to surgery and how [place]? WARFARIN  4. Practice name and name of physician performing surgery? [facility], DR [name] WOHL  5. What is your office phone number [phone]   7.   What is your office fax number [phone]  8.   Anesthesia type (None, local, MAC, general) ? NOT LISTED   Elizabeth Hall 07/21/2019, 8:44 AM  _________________________________________________________________   (provider comments below)

## 2019-07-26 ENCOUNTER — Telehealth: Payer: Self-pay

## 2019-07-26 NOTE — Telephone Encounter (Signed)
Blood thinner advice has been provided to patient per Clearance letter received from [facility].  Per Pharmacy: "She will require bridging with Lovenox in order to hold Warfarin for 5 days before colonoscopy.  This will be coordinated in [facility] where patient is followed."  Thank you,  Sharyn Lull, [place]

## 2019-07-26 NOTE — Telephone Encounter (Signed)
   Primary Cardiologist: Kathlyn Sacramento, MD  Chart reviewed as part of pre-operative protocol coverage. Patient was last seen by Dr. Fletcher Anon on 03/31/2019 and was doing well from a cardiac standpoint. I called patient today and she reports no changes since last visit. She notes mild shortness of breath with activity but states she never has to stop to catch her breath. No chest pain or shortness of breath at rest. She has occasional brief episodes of palpitations but no lightheadedness, dizziness, or syncope. No acute CHF symptoms. Able to complete >4.0 METS. Therefore, based on ACC/AHA guidelines, Elizabeth Hall would be at acceptable risk for the planned procedure without further cardiovascular testing.   Per Pharmacy: "She will require bridging with Lovenox in order to hold Warfarin for 5 days before colonoscopy. This will be coordinated in [facility] where patient is followed."  I will route this recommendation to the requesting party via [place] fax function and remove from pre-op pool.  Please call with questions.  Darreld Mclean, PA-C 07/26/2019, 8:36 AM

## 2019-08-09 ENCOUNTER — Ambulatory Visit (INDEPENDENT_AMBULATORY_CARE_PROVIDER_SITE_OTHER): Payer: Medicare Other | Admitting: *Deleted

## 2019-08-09 DIAGNOSIS — I5022 Chronic systolic (congestive) heart failure: Secondary | ICD-10-CM | POA: Diagnosis not present

## 2019-08-09 LAB — CUP PACEART REMOTE DEVICE CHECK
Date Time Interrogation Session: 20210216062353
Implantable Lead Implant Date: 20150316
Implantable Lead Implant Date: 20150316
Implantable Lead Location: 753858
Implantable Lead Location: 753860
Implantable Lead Model: 346
Implantable Lead Serial Number: 25130583
Implantable Pulse Generator Implant Date: 20150316
Pulse Gen Model: 383547
Pulse Gen Serial Number: 60765179

## 2019-08-10 NOTE — Progress Notes (Signed)
ICD Remote  

## 2019-08-12 DIAGNOSIS — S7001XA Contusion of right hip, initial encounter: Secondary | ICD-10-CM | POA: Diagnosis not present

## 2019-08-12 DIAGNOSIS — S8002XA Contusion of left knee, initial encounter: Secondary | ICD-10-CM | POA: Diagnosis not present

## 2019-08-15 ENCOUNTER — Ambulatory Visit (INDEPENDENT_AMBULATORY_CARE_PROVIDER_SITE_OTHER): Payer: Medicare Other

## 2019-08-15 ENCOUNTER — Other Ambulatory Visit: Payer: Self-pay

## 2019-08-15 DIAGNOSIS — I4891 Unspecified atrial fibrillation: Secondary | ICD-10-CM | POA: Diagnosis not present

## 2019-08-15 DIAGNOSIS — Z5181 Encounter for therapeutic drug level monitoring: Secondary | ICD-10-CM

## 2019-08-15 DIAGNOSIS — Z953 Presence of xenogenic heart valve: Secondary | ICD-10-CM | POA: Diagnosis not present

## 2019-08-15 DIAGNOSIS — I4821 Permanent atrial fibrillation: Secondary | ICD-10-CM

## 2019-08-15 LAB — POCT INR: INR: 3.1 — AB (ref 2.0–3.0)

## 2019-08-15 NOTE — Patient Instructions (Signed)
-   Have a serving of greens today - continue current warfarin dosage of 3 mg every day except 4.5 mg on Mondays.  - recheck in 4 weeks - we will go over Lovenox bridge instructions for your colonoscopy on 3/29 at that time

## 2019-08-28 ENCOUNTER — Other Ambulatory Visit: Payer: Self-pay | Admitting: Family Medicine

## 2019-08-28 DIAGNOSIS — E785 Hyperlipidemia, unspecified: Secondary | ICD-10-CM

## 2019-08-28 DIAGNOSIS — R7303 Prediabetes: Secondary | ICD-10-CM

## 2019-08-28 DIAGNOSIS — N183 Chronic kidney disease, stage 3 unspecified: Secondary | ICD-10-CM

## 2019-08-28 DIAGNOSIS — I4821 Permanent atrial fibrillation: Secondary | ICD-10-CM

## 2019-08-29 ENCOUNTER — Ambulatory Visit: Payer: Medicare Other

## 2019-08-29 ENCOUNTER — Ambulatory Visit (INDEPENDENT_AMBULATORY_CARE_PROVIDER_SITE_OTHER): Payer: Medicare Other

## 2019-08-29 VITALS — BP 122/71 | HR 75 | Temp 96.8°F | Wt 133.8 lb

## 2019-08-29 DIAGNOSIS — Z0000 Encounter for general adult medical examination without abnormal findings: Secondary | ICD-10-CM | POA: Diagnosis not present

## 2019-08-29 NOTE — Progress Notes (Signed)
PCP notes:  Health Maintenance: Colonoscopy scheduled 09/19/2019 per patient Mammogram scheduled 11/14/2019 per patient    Abnormal Screenings: none  Patient concerns: none   Nurse concerns: none   Next PCP appt.: 09/05/2019 @ 9:30 am

## 2019-08-29 NOTE — Progress Notes (Signed)
Subjective:   Elizabeth Hall is a 79 y.o. female who presents for Medicare Annual (Subsequent) preventive examination.  Review of Systems: N/A   This visit is being conducted through telemedicine via telephone at the nurse health advisor's home address due to the COVID-19 pandemic. This patient has given me verbal consent via doximity to conduct this visit, patient states they are participating from their home address. Patient and myself are on the telephone call. There is no referral for this visit. Some vital signs may be absent or patient reported.    Patient identification: identified by name, DOB, and current address   Cardiac Risk Factors include: advanced age (>35men, >8 women);hypertension;dyslipidemia     Objective:     Vitals: BP 122/71   Pulse 75   Temp (!) 96.8 F (36 C)   Wt 133 lb 12.8 oz (60.7 kg)   LMP  (LMP Unknown) Comment: menopause age 50  BMI 23.70 kg/m   Body mass index is 23.7 kg/m.  Advanced Directives 08/29/2019 08/02/2018 07/20/2017 05/15/2017 04/20/2017 12/29/2016 07/07/2016  Does Patient Have a Medical Advance Directive? Yes Yes Yes Yes Yes Yes Yes  Type of Paramedic of [place];Living will [place];Living will [place];Living will Living will [place];Living will [place];Living will [place];Living will  Does patient want to make changes to medical advance directive? - - - No - Patient declined No - Patient declined No - Patient declined -  Copy of [place] in Chart? Yes - validated most recent copy scanned in chart (See row information) Yes - validated most recent copy scanned in chart (See row information) Yes - - No - copy requested Yes  Would patient like information on creating a medical advance directive? - - - No - Patient declined - - -    Tobacco Social History   Tobacco Use  Smoking Status  Former Smoker  . Packs/day: 0.50  . Years: 20.00  . Pack years: 10.00  . Types: Cigarettes  . Quit date: 02/04/1990  . Years since quitting: 29.5  Smokeless Tobacco Never Used     Counseling given: Not Answered   Clinical Intake:  Pre-visit preparation completed: Yes  Pain : No/denies pain     Nutritional Risks: None Diabetes: No  How often do you need to have someone help you when you read instructions, pamphlets, or other written materials from your doctor or pharmacy?: 1 - Never What is the last grade level you completed in school?: Nursing degree  Interpreter Needed?: No  Information entered by :: CJohnson, LPN  Past Medical History:  Diagnosis Date  . Bleeding of eye   . Chronic systolic heart failure ([place])    a. 11/2016 Echo: EF 35-40%, mild MR w/ nl fxning bioprosthesis, mod dil LA.  . CKD (chronic kidney disease) stage 3, GFR 30-59 ml/min    baseline Cr 1.5-2; prior saw nephrologist thought hypertensive nephropathy/renovascular disease  . CVA (cerebral infarction)    a. 2014 occipital lobe - some vision loss when coumadin held and not bridged  . Dyslipidemia   . Glaucoma   . H. pylori infection    treated  . H/O mitral valve replacement    a. 05/2012 s/p 36mm Carpentier Edwards pericardial tissue valve.  [name] Hx of rheumatic fever   . Hypertension   . Melanoma ([place]) 2006   L shoulder  . NICM (nonischemic cardiomyopathy) ([place])  a. 08/2013 s/p Biotronik Ilesto 7 HF1 BiV ICD (ser# [id number]);  b. 11/2016 Echo: EF 35-40%.  . Non-obstructive CAD    a. s/p MI 2001;  b. 12/2016 Cath: LM 20ost, OM2 60, RCA 30p/d, EF 35-45%. Nl R heart filling pressures.  . Permanent atrial fibrillation ([place])    a. 04/2012 s/p AVN & PPM placement (later upgraded to BiV ICD).  [name] Phlebitis   . 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 06/2012 h/o acute ischemia due to thromboembolism radial and ulnar arteries when coumadin held s/p atherectomy - needs bridging if off coumadin   . Tricuspid regurgitation    a. 05/2012 s/p TV annuloplasty @ time of MVR.   Past Surgical History:  Procedure Laterality Date  . ABLATION  2013  . ATHERECTOMY  06/2012   left arm after coumadin held without bridging  . BREAST BIOPSY    . CARDIOVASCULAR STRESS TEST  03/2016   WNL  . CATARACT EXTRACTION Bilateral 2007, 2010  . COLONOSCOPY  03/2014   diverticulosis, int hem, rpt 5 yrs for h/o polyps ([place])  . defibrillator     MEDTRONIC 5086 MRI 52 CM LEAD, SERIAL # [id number]  . DEXA  07/2016   WNL  . DG ABDOMEN COMPLETE ([place] [facility])  02/2013   HH, mod severe erosive gastritis, duodenitis  . GALLBLADDER SURGERY  2006  . INSERT / REPLACE / REMOVE PACEMAKER  08/2013  . MITRAL VALVE REPLACEMENT  05/2012  . RIGHT/LEFT HEART CATH AND CORONARY ANGIOGRAPHY N/A 12/29/2016   Procedure: Right/Left Heart Cath and Coronary Angiography;  Surgeon: [name], MD;  Location: [facility];  Service: Cardiovascular;  Laterality: N/A;  . TOTAL ABDOMINAL HYSTERECTOMY W/ BILATERAL SALPINGOOPHORECTOMY  1991   irregular bleeding  . TRICUSPID VALVE REPLACEMENT  05/2012  . US ECHOCARDIOGRAPHY  02/2014   EF 40%, LA marked dilation, gen LV hypokinesis, mitral prosthetic valve   Family History  Problem Relation Age of Onset  . CAD Father 84       MI  . Hypertension Brother   . Heart failure Brother        CHF with pacemaker  . Stroke Brother   . Heart failure Sister        CHF  . Stroke Sister   . Diabetes Cousin   . Cancer Maternal Aunt        colon  . Cancer Cousin        breast  . Cancer Cousin        brain tumor  . Basal cell carcinoma Daughter    Social History   Socioeconomic History  . Marital status: Married    Spouse name: Not on file  . Number of children: Not on file  . Years of education: Not on file  . Highest education level: Not on file  Occupational History  . Not on file  Tobacco Use  . Smoking status: Former Smoker    Packs/day: 0.50    Years: 20.00     Pack years: 10.00    Types: Cigarettes    Quit date: 02/04/1990    Years since quitting: 29.5  . Smokeless tobacco: Never Used  Substance and Sexual Activity  . Alcohol use: Yes    Alcohol/week: 0.0 standard drinks    Comment: occasional  . Drug use: No  . Sexual activity: Not Currently    Birth control/protection: None  Other Topics Concern  . 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 Difficulty of Paying Living Expenses: Not hard at all  Food Insecurity: No Food Insecurity  . Worried About Charity fundraiser in the Last Year: Never true  . Ran Out of Food in the Last Year: Never true  Transportation Needs: No Transportation Needs  . Lack of Transportation (Medical): No  . Lack of Transportation (Non-Medical): No  Physical Activity: Inactive  . Days of Exercise per Week: 0 days  . Minutes of Exercise per Session: 0 min  Stress: No Stress Concern Present  . Feeling of Stress : Not at all  Social Connections:   . Frequency of Communication with Friends and Family: Not on file  . Frequency of Social Gatherings with Friends and Family: Not on file  . Attends Religious Services: Not on file  . Active Member of Clubs or Organizations: Not on file  . Attends Archivist Meetings: Not on file  . Marital Status: Not on file    Outpatient Encounter Medications as of 08/29/2019  Medication Sig  . acetaminophen (TYLENOL) 500 MG tablet Take 500-1,000 mg by mouth every 6 (six) hours as needed (for headaches/pain.).  [name] aspirin EC 81 MG tablet Take 81 mg by mouth daily.  . bumetanide (BUMEX) 0.5 MG tablet TAKE 1 TABLET DAILY. CONTINUE TO MONITOR WEIGHT, IF YOU START GAINING FLUIDS, THEN INCREASE BACK TO 1MG  DAILY    . carvedilol (COREG) 12.5 MG tablet TAKE 1 TABLET TWICE A DAY  WITH MEALS  . Cholecalciferol (VITAMIN D) 50 MCG (2000 UT) CAPS Take 1 capsule (2,000 Units total) by mouth daily.  [name] ENTRESTO 24-26 MG TAKE 1 TABLET BY MOUTH TWICE A DAY  . LUMIGAN 0.01 % SOLN Place 1 drop into both eyes at bedtime.   . Magnesium 250 MG TABS Take 250 mg by mouth daily.  . meclizine (ANTIVERT) 25 MG tablet TAKE 1 TABLET TWICE A DAY  AS NEEDED FOR DIZZINESS  . Polyethyl Glycol-Propyl Glycol (LUBRICANT EYE DROPS) 0.4-0.3 % SOLN Place 1 drop into both eyes 3 (three) times daily as needed (for dry eyes.).  [name] spironolactone (ALDACTONE) 25 MG tablet TAKE 1/2 TABLET (12.5MG      TOTAL) DAILY  . vitamin E 400 UNIT capsule Take 400 Units by mouth daily.  [name] warfarin (JANTOVEN) 3 MG tablet TAKE AS DIRECTED BY THE    COUMADIN CLINIC  . enoxaparin (LOVENOX) 60 MG/0.6ML injection Inject 0.6 mLs (60 mg total) into the skin every 12 (twelve) hours. (Patient not taking: Reported on 07/05/2019)   No facility-administered encounter medications on file as of 08/29/2019.    Activities of Daily Living In your present state of health, do you have any difficulty performing the following activities: 08/29/2019  Hearing? N  Vision? Y  Comment has glaucoma and floaters  Difficulty concentrating or making decisions? N  Walking or climbing stairs? N  Dressing or bathing? N  Doing errands, shopping? N  Preparing Food and eating ? N  Using the Toilet? N  In the past six months, have you accidently leaked urine? Y  Comment stress incontinence sometimes  Do you have problems with loss of bowel control? N  Managing your Medications? N  Managing  your Finances? N  Housekeeping or managing your Housekeeping? N  Some recent data might be hidden    Patient Care Team: Ria Bush, MD as PCP - General (Family Medicine) [name], MD as PCP - Cardiology (Cardiology) [name] [name], MD as Referring Physician  (Ophthalmology) [name], MD as Consulting Physician (Obstetrics and Gynecology) [name], MD as Consulting Physician (Cardiology) [name], MD as Consulting Physician (Dermatology)    Assessment:   This is a routine wellness examination for Karra.  Exercise Activities and Dietary recommendations Current Exercise Habits: The patient does not participate in regular exercise at present, Exercise limited by: None identified  Goals    . Follow up with Primary Care Provider     Starting 08/02/2018, I will continue to take medications as prescribed and to keep appointments with PCP as scheduled.     . Patient Stated     08/29/2019, I will try to start a regular exercise regimen weekly.        Fall Risk Fall Risk  08/29/2019 08/02/2018 07/20/2017 04/20/2017 07/07/2016  Falls in the past year? 1 0 Yes No No  Comment tripped in [employer] parking lot - pt fell after stepping backwards off of a stool; seen in ER - -  Number falls in past yr: 0 - 1 - -  Injury with Fall? 0 - Yes - -  Risk for fall due to : Medication side effect - - - -  Follow up Falls evaluation completed;Falls prevention discussed - - - -   Is the patient's home free of loose throw rugs in walkways, pet beds, electrical cords, etc?   yes      Grab bars in the bathroom? no      Handrails on the stairs?   no      Adequate lighting?   yes  Timed Get Up and Go performed: N/A  Depression Screen PHQ 2/9 Scores 08/29/2019 08/02/2018 07/29/2017 07/20/2017  PHQ - 2 Score 0 0 0 0  PHQ- 9 Score 0 0 0 0     Cognitive Function MMSE - Mini Mental State Exam 08/29/2019 08/02/2018 07/20/2017 07/07/2016  Orientation to time 5 5 5 5   Orientation to Place 5 5 5 5   Registration 3 3 3 3   Attention/ Calculation 5 0 0 0  Recall 3 3 3 3   Language- name 2 objects - 0 0 0  Language- repeat 1 1 1 1   Language- follow 3 step command - 3 3 3   Language- read & follow direction - 0 0 0  Write a sentence - 0 0 0  Copy design - 0 0 0  Total  score - 20 20 20   Mini Cog  Mini-Cog screen was completed. Maximum score is 22. A value of 0 denotes this part of the MMSE was not completed or the patient failed this part of the Mini-Cog screening.       Immunization History  Administered Date(s) Administered  . Influenza, High Dose Seasonal PF 03/27/2017, 03/01/2018, 02/08/2019  . Influenza,inj,Quad PF,6+ Mos 03/21/2015, 03/25/2016  . Influenza-Unspecified 03/01/2018  . Pneumococcal Conjugate-13 05/23/2014  . Pneumococcal Polysaccharide-23 07/02/2015  . Td 06/23/2006    Qualifies for Shingles Vaccine: Yes  Screening Tests Health Maintenance  Topic Date Due  . DTAP VACCINES (1) 08/10/1941  . COLONOSCOPY  04/22/2019  . DTaP/Tdap/Td (2 - Tdap) 06/22/2026 (Originally 06/23/2016)  . TETANUS/TDAP  06/22/2026 (Originally 06/23/2016)  . INFLUENZA VACCINE  Completed  . DEXA SCAN  Completed  . PNA vac Low Risk Adult  Completed    Cancer Screenings: Lung: Low Dose CT Chest recommended if Age 45-80 years, 30 pack-year currently smoking OR have quit w/in 15 years. Patient does not qualify. Breast:  Up to date on Mammogram: Yes, completed 11/10/2018   Bone Density/Dexa: completed 07/28/2016 Colorectal: completed 04/21/2014  Additional Screenings:  Hepatitis C Screening: N/A     Plan:   Patient will try to start a exercise regimen weekly.     I have personally reviewed and noted the following in the patient's chart:   . Medical and social history . Use of alcohol, tobacco or illicit drugs  . Current medications and supplements . Functional ability and status . Nutritional status . Physical activity . Advanced directives . List of other physicians . Hospitalizations, surgeries, and ER visits in previous 12 months . Vitals . Screenings to include cognitive, depression, and falls . Referrals and appointments  In addition, I have reviewed and discussed with patient certain preventive protocols, quality metrics, and best practice  recommendations. A written personalized care plan for preventive services as well as general preventive health recommendations were provided to patient.     Andrez Grime, LPN  [phone]

## 2019-08-29 NOTE — Patient Instructions (Signed)
Elizabeth Hall , Thank you for taking time to come for your Medicare Wellness Visit. I appreciate your ongoing commitment to your health goals. Please review the following plan we discussed and let me know if I can assist you in the future.   Screening recommendations/referrals: Colonoscopy: Up to date, completed 04/21/2014 Mammogram: Up to date, completed 11/10/2018 Bone Density: completed 07/28/2016 Recommended yearly ophthalmology/optometry visit for glaucoma screening and checkup Recommended yearly dental visit for hygiene and checkup  Vaccinations: Influenza vaccine: Up to date, completed 02/08/2019 Pneumococcal vaccine: Completed series Tdap vaccine: decline Shingles vaccine: discussed    Advanced directives: copy in chart  Conditions/risks identified: hypertension, hyperlipidemia  Next appointment: 09/05/2019 @ 9:30 am    Preventive Care 78 Years and Older, Female Preventive care refers to lifestyle choices and visits with your health care provider that can promote health and wellness. What does preventive care include?  A yearly physical exam. This is also called an annual well check.  Dental exams once or twice a year.  Routine eye exams. Ask your health care provider how often you should have your eyes checked.  Personal lifestyle choices, including:  Daily care of your teeth and gums.  Regular physical activity.  Eating a healthy diet.  Avoiding tobacco and drug use.  Limiting alcohol use.  Practicing safe sex.  Taking low-dose aspirin every day.  Taking vitamin and mineral supplements as recommended by your health care provider. What happens during an annual well check? The services and screenings done by your health care provider during your annual well check will depend on your age, overall health, lifestyle risk factors, and family history of disease. Counseling  Your health care provider may ask you questions about your:  Alcohol use.  Tobacco  use.  Drug use.  Emotional well-being.  Home and relationship well-being.  Sexual activity.  Eating habits.  History of falls.  Memory and ability to understand (cognition).  Work and work Statistician.  Reproductive health. Screening  You may have the following tests or measurements:  Height, weight, and BMI.  Blood pressure.  Lipid and cholesterol levels. These may be checked every 5 years, or more frequently if you are over 60 years old.  Skin check.  Lung cancer screening. You may have this screening every year starting at age 78 if you have a 30-pack-year history of smoking and currently smoke or have quit within the past 15 years.  Fecal occult blood test (FOBT) of the stool. You may have this test every year starting at age 78.  Flexible sigmoidoscopy or colonoscopy. You may have a sigmoidoscopy every 5 years or a colonoscopy every 10 years starting at age 30.  Hepatitis C blood test.  Hepatitis B blood test.  Sexually transmitted disease (STD) testing.  Diabetes screening. This is done by checking your blood sugar (glucose) after you have not eaten for a while (fasting). You may have this done every 1-3 years.  Bone density scan. This is done to screen for osteoporosis. You may have this done starting at age 78.  Mammogram. This may be done every 1-2 years. Talk to your health care provider about how often you should have regular mammograms. Talk with your health care provider about your test results, treatment options, and if necessary, the need for more tests. Vaccines  Your health care provider may recommend certain vaccines, such as:  Influenza vaccine. This is recommended every year.  Tetanus, diphtheria, and acellular pertussis (Tdap, Td) vaccine. You may need a Td  booster every 10 years.  Zoster vaccine. You may need this after age 78.  Pneumococcal 13-valent conjugate (PCV13) vaccine. One dose is recommended after age 18.  Pneumococcal  polysaccharide (PPSV23) vaccine. One dose is recommended after age 88. Talk to your health care provider about which screenings and vaccines you need and how often you need them. This information is not intended to replace advice given to you by your health care provider. Make sure you discuss any questions you have with your health care provider. Document Released: 07/06/2015 Document Revised: 02/27/2016 Document Reviewed: 04/10/2015 Elsevier Interactive Patient Education  2017 [place] Prevention in the Home Falls can cause injuries. They can happen to people of all ages. There are many things you can do to make your home safe and to help prevent falls. What can I do on the outside of my home?  Regularly fix the edges of walkways and driveways and fix any cracks.  Remove anything that might make you trip as you walk through a door, such as a raised step or threshold.  Trim any bushes or trees on the path to your home.  Use bright outdoor lighting.  Clear any walking paths of anything that might make someone trip, such as rocks or tools.  Regularly check to see if handrails are loose or broken. Make sure that both sides of any steps have handrails.  Any raised decks and porches should have guardrails on the edges.  Have any leaves, snow, or ice cleared regularly.  Use sand or salt on walking paths during winter.  Clean up any spills in your garage right away. This includes oil or grease spills. What can I do in the bathroom?  Use night lights.  Install grab bars by the toilet and in the tub and shower. Do not use towel bars as grab bars.  Use non-skid mats or decals in the tub or shower.  If you need to sit down in the shower, use a plastic, non-slip stool.  Keep the floor dry. Clean up any water that spills on the floor as soon as it happens.  Remove soap buildup in the tub or shower regularly.  Attach bath mats securely with double-sided non-slip rug  tape.  Do not have throw rugs and other things on the floor that can make you trip. What can I do in the bedroom?  Use night lights.  Make sure that you have a light by your bed that is easy to reach.  Do not use any sheets or blankets that are too big for your bed. They should not hang down onto the floor.  Have a firm chair that has side arms. You can use this for support while you get dressed.  Do not have throw rugs and other things on the floor that can make you trip. What can I do in the kitchen?  Clean up any spills right away.  Avoid walking on wet floors.  Keep items that you use a lot in easy-to-reach places.  If you need to reach something above you, use a strong step stool that has a grab bar.  Keep electrical cords out of the way.  Do not use floor polish or wax that makes floors slippery. If you must use wax, use non-skid floor wax.  Do not have throw rugs and other things on the floor that can make you trip. What can I do with my stairs?  Do not leave any items on the stairs.  Make sure that there are handrails on both sides of the stairs and use them. Fix handrails that are broken or loose. Make sure that handrails are as [place] as the stairways.  Check any carpeting to make sure that it is firmly attached to the stairs. Fix any carpet that is loose or worn.  Avoid having throw rugs at the top or bottom of the stairs. If you do have throw rugs, attach them to the floor with carpet tape.  Make sure that you have a light switch at the top of the stairs and the bottom of the stairs. If you do not have them, ask someone to add them for you. What else can I do to help prevent falls?  Wear shoes that:  Do not have high heels.  Have rubber bottoms.  Are comfortable and fit you well.  Are closed at the toe. Do not wear sandals.  If you use a stepladder:  Make sure that it is fully opened. Do not climb a closed stepladder.  Make sure that both sides of the  stepladder are locked into place.  Ask someone to hold it for you, if possible.  Clearly mark and make sure that you can see:  Any grab bars or handrails.  First and last steps.  Where the edge of each step is.  Use tools that help you move around (mobility aids) if they are needed. These include:  Canes.  Walkers.  Scooters.  Crutches.  Turn on the lights when you go into a dark area. Replace any light bulbs as soon as they burn out.  Set up your furniture so you have a clear path. Avoid moving your furniture around.  If any of your floors are uneven, fix them.  If there are any pets around you, be aware of where they are.  Review your medicines with your doctor. Some medicines can make you feel dizzy. This can increase your chance of falling. Ask your doctor what other things that you can do to help prevent falls. This information is not intended to replace advice given to you by your health care provider. Make sure you discuss any questions you have with your health care provider. Document Released: 04/05/2009 Document Revised: 11/15/2015 Document Reviewed: 07/14/2014 Elsevier Interactive Patient Education  2017 [employer].

## 2019-08-30 ENCOUNTER — Other Ambulatory Visit (INDEPENDENT_AMBULATORY_CARE_PROVIDER_SITE_OTHER): Payer: Medicare Other

## 2019-08-30 DIAGNOSIS — I4821 Permanent atrial fibrillation: Secondary | ICD-10-CM

## 2019-08-30 DIAGNOSIS — E785 Hyperlipidemia, unspecified: Secondary | ICD-10-CM | POA: Diagnosis not present

## 2019-08-30 DIAGNOSIS — R7303 Prediabetes: Secondary | ICD-10-CM

## 2019-08-30 DIAGNOSIS — N183 Chronic kidney disease, stage 3 unspecified: Secondary | ICD-10-CM | POA: Diagnosis not present

## 2019-08-30 LAB — CBC WITH DIFFERENTIAL/PLATELET
Basophils Absolute: 0 10*3/uL (ref 0.0–0.1)
Basophils Relative: 0.7 % (ref 0.0–3.0)
Eosinophils Absolute: 0.2 10*3/uL (ref 0.0–0.7)
Eosinophils Relative: 3.5 % (ref 0.0–5.0)
HCT: 36.2 % (ref 36.0–46.0)
Hemoglobin: 11.9 g/dL — ABNORMAL LOW (ref 12.0–15.0)
Lymphocytes Relative: 39.1 % (ref 12.0–46.0)
Lymphs Abs: 2.2 10*3/uL (ref 0.7–4.0)
MCHC: 32.9 g/dL (ref 30.0–36.0)
MCV: 90.4 fl (ref 78.0–100.0)
Monocytes Absolute: 0.8 10*3/uL (ref 0.1–1.0)
Monocytes Relative: 13.6 % — ABNORMAL HIGH (ref 3.0–12.0)
Neutro Abs: 2.4 10*3/uL (ref 1.4–7.7)
Neutrophils Relative %: 43.1 % (ref 43.0–77.0)
Platelets: 221 10*3/uL (ref 150.0–400.0)
RBC: 4.01 Mil/uL (ref 3.87–5.11)
RDW: 15.1 % (ref 11.5–15.5)
WBC: 5.5 10*3/uL (ref 4.0–10.5)

## 2019-08-30 LAB — COMPREHENSIVE METABOLIC PANEL
ALT: 9 U/L (ref 0–35)
AST: 15 U/L (ref 0–37)
Albumin: 4.4 g/dL (ref 3.5–5.2)
Alkaline Phosphatase: 42 U/L (ref 39–117)
BUN: 27 mg/dL — ABNORMAL HIGH (ref 6–23)
CO2: 27 mEq/L (ref 19–32)
Calcium: 9.9 mg/dL (ref 8.4–10.5)
Chloride: 102 mEq/L (ref 96–112)
Creatinine, Ser: 1.31 mg/dL — ABNORMAL HIGH (ref 0.40–1.20)
GFR: 39.35 mL/min — ABNORMAL LOW (ref >60.00)
Glucose, Bld: 95 mg/dL (ref 70–99)
Potassium: 4.4 mEq/L (ref 3.5–5.1)
Sodium: 134 mEq/L — ABNORMAL LOW (ref 135–145)
Total Bilirubin: 0.7 mg/dL (ref 0.2–1.2)
Total Protein: 7.3 g/dL (ref 6.0–8.3)

## 2019-08-30 LAB — LIPID PANEL
Cholesterol: 166 mg/dL (ref 0–200)
HDL: 67.6 mg/dL (ref >39.00)
LDL Cholesterol: 88 mg/dL (ref 0–99)
NonHDL: 97.91
Total CHOL/HDL Ratio: 2
Triglycerides: 51 mg/dL (ref 0.0–149.0)
VLDL: 10.2 mg/dL (ref 0.0–40.0)

## 2019-08-30 LAB — HEMOGLOBIN A1C: Hgb A1c MFr Bld: 5.9 % (ref 4.6–6.5)

## 2019-08-30 LAB — VITAMIN D 25 HYDROXY (VIT D DEFICIENCY, FRACTURES): VITD: 53.29 ng/mL (ref 30.00–100.00)

## 2019-09-05 ENCOUNTER — Encounter: Payer: Self-pay | Admitting: Family Medicine

## 2019-09-05 ENCOUNTER — Other Ambulatory Visit: Payer: Self-pay

## 2019-09-05 ENCOUNTER — Ambulatory Visit (INDEPENDENT_AMBULATORY_CARE_PROVIDER_SITE_OTHER): Payer: Medicare Other | Admitting: Family Medicine

## 2019-09-05 VITALS — BP 118/70 | HR 76 | Temp 97.8°F | Ht 62.5 in | Wt 134.4 lb

## 2019-09-05 DIAGNOSIS — Z85820 Personal history of malignant melanoma of skin: Secondary | ICD-10-CM

## 2019-09-05 DIAGNOSIS — E785 Hyperlipidemia, unspecified: Secondary | ICD-10-CM | POA: Diagnosis not present

## 2019-09-05 DIAGNOSIS — I10 Essential (primary) hypertension: Secondary | ICD-10-CM

## 2019-09-05 DIAGNOSIS — Z953 Presence of xenogenic heart valve: Secondary | ICD-10-CM

## 2019-09-05 DIAGNOSIS — H409 Unspecified glaucoma: Secondary | ICD-10-CM | POA: Diagnosis not present

## 2019-09-05 DIAGNOSIS — Z7189 Other specified counseling: Secondary | ICD-10-CM

## 2019-09-05 DIAGNOSIS — R7303 Prediabetes: Secondary | ICD-10-CM | POA: Diagnosis not present

## 2019-09-05 DIAGNOSIS — I5022 Chronic systolic (congestive) heart failure: Secondary | ICD-10-CM | POA: Diagnosis not present

## 2019-09-05 DIAGNOSIS — Z950 Presence of cardiac pacemaker: Secondary | ICD-10-CM | POA: Diagnosis not present

## 2019-09-05 DIAGNOSIS — I4821 Permanent atrial fibrillation: Secondary | ICD-10-CM

## 2019-09-05 DIAGNOSIS — Z8673 Personal history of transient ischemic attack (TIA), and cerebral infarction without residual deficits: Secondary | ICD-10-CM

## 2019-09-05 DIAGNOSIS — N1832 Chronic kidney disease, stage 3b: Secondary | ICD-10-CM

## 2019-09-05 NOTE — Assessment & Plan Note (Signed)
Advanced directive -Scanned 08/2015. Husband [name] then daughter [name] are [place]. Declines prolonged life support if terminal condition.[place] with temporary life support for reversible condition.POST form completed and in chart.

## 2019-09-05 NOTE — Assessment & Plan Note (Signed)
Continue coumadin with lovenox bridging

## 2019-09-05 NOTE — Assessment & Plan Note (Signed)
Continue regular derm f/u.  

## 2019-09-05 NOTE — Assessment & Plan Note (Signed)
Regularly sees ophtho ([place]fil[place])

## 2019-09-05 NOTE — Assessment & Plan Note (Signed)
Chronic, stable. Continue current regimen. 

## 2019-09-05 NOTE — Assessment & Plan Note (Signed)
Chronic, will remain off statin.  The 10-year ASCVD risk score [name], et al., 2013) is: 22.6%   Values used to calculate the score:     Age: 79 years     Sex: Female     Is Non-Hispanic African American: No     Diabetic: No     Tobacco smoker: No     Systolic Blood Pressure: 123456 mmHg     Is BP treated: Yes     HDL Cholesterol: 67.6 mg/dL     Total Cholesterol: 166 mg/dL

## 2019-09-05 NOTE — Assessment & Plan Note (Signed)
Improved. Continue to limit added sugar in diet.

## 2019-09-05 NOTE — Assessment & Plan Note (Signed)
Appreciate cardiology care.  °

## 2019-09-05 NOTE — Progress Notes (Signed)
This visit was conducted in person.  BP 118/70 (BP Location: Left Arm, Patient Position: Sitting, Cuff Size: Normal)   Pulse 76   Temp 97.8 F (36.6 C) (Temporal)   Ht 5' 2.5" (1.588 m)   Wt 134 lb 7 oz (61 kg)   LMP  (LMP Unknown) Comment: menopause age 79  SpO2 98%   BMI 24.20 kg/m    CC: CPE Subjective:    Patient ID: Elizabeth Hall, female    DOB: [birth year], 79 y.o.   MRN: [id number]  HPI: Elizabeth Hall is a 79 y.o. female presenting on 09/05/2019 for Annual Exam (Prt 2. )   Saw health advisor last week for medicare wellness visit. Note reviewed.    No exam data present    Clinical Support from 08/29/2019 in [place] at [facility] Total Score  0      Fall Risk  08/29/2019 08/02/2018 07/20/2017 04/20/2017 07/07/2016  Falls in the past year? 1 0 Yes No No  Comment tripped in [employer] parking lot - pt fell after stepping backwards off of a stool; seen in ER - -  Number falls in past yr: 0 - 1 - -  Injury with Fall? 0 - Yes - -  Risk for fall due to : Medication side effect - - - -  Follow up Falls evaluation completed;Falls prevention discussed - - - -  Fall at [employer] 3 wks ago, hit L knee and R hip - saw orthopedist.   Episode of diverticulitis 06/2019 - has fully resolved. Colonoscopy was postponed to end of this month. Will bridge with lovenox.   Completed pulm rehab for chronic exertional dyspnea, saw cards/EP (Arida/Klein) and pulm. Known NICM, sCHF, permanent afib and complete heart block s/p AV ablation, ICD in place. H/o CVA after warfarin was held - needs bridging with lovenox. 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  Preventative: COLONOSCOPY Date: 03/2014 diverticulosis, int hem, rpt 5 yrs for h/o polyps ([place]). Requests referral to Dr [name].  Well woman - established with Dr Kathryne Eriksson. Sees in May Mammogram -10/2018 - normal at [facility] office. upcoming already scheduled.  DEXA - normal 2018.  Yearly flu shot Tetanus -  2008 Pneumovax2017, prevnar 05/2014  Pfizer covid vaccine completed 07/2019.  zostavax -declines. Shingrix - discussed - declines Advanced directive -Scanned 08/2015. Husband [name] then daughter [name] are [place]. Declines prolonged life support if terminal condition.[place] with temporary life support for reversible condition.POST form completed and in chart.  Seat belt use discussed. Sunscreen use discussed. No changing moles on skin. Personal h/o melanoma.Sees derm regularly (Dr [name]) [place].  Ex smoker  Alcohol - occasional wine Dentist tries yearly - full dentures Eye exam yearly (Porfilio). H/o L vision loss from CVA and glaucoma - sees Q4 months.  Bowel - no constipation  Bladder - no incontinence   Lives with husband, 1 cat Occupation: retired Therapist, sports (2007), worked for [employer] Edu: nursing school Activity: keeps 39 yo grandson Diet: good water, fruits/vegetables     Relevant past medical, surgical, family and social history reviewed and updated as indicated. Interim medical history since our last visit reviewed. Allergies and medications reviewed and updated. Outpatient Medications Prior to Visit  Medication Sig Dispense Refill  . acetaminophen (TYLENOL) 500 MG tablet Take 500-1,000 mg by mouth every 6 (six) hours as needed (for headaches/pain.).    [name] aspirin EC 81 MG tablet Take 81 mg by mouth daily.    [name] B  Complex Vitamins (VITAMIN B COMPLEX PO) Take by mouth daily.    . bumetanide (BUMEX) 0.5 MG tablet TAKE 1 TABLET DAILY. CONTINUE TO MONITOR WEIGHT, IF YOU START GAINING FLUIDS, THEN INCREASE BACK TO 1MG  DAILY 90 tablet 2  . carvedilol (COREG) 12.5 MG tablet TAKE 1 TABLET TWICE A DAY  WITH MEALS 180 tablet 1  . Cholecalciferol (VITAMIN D) 50 MCG (2000 UT) CAPS Take 1 capsule (2,000 Units total) by mouth daily. 30 capsule   . diphenhydrAMINE (BENADRYL) 25 MG tablet Take 25 mg by mouth as needed.    [name] ENTRESTO 24-26 MG TAKE 1 TABLET BY MOUTH TWICE A DAY 180 tablet 0    . LUMIGAN 0.01 % SOLN Place 1 drop into both eyes at bedtime.     . Magnesium 250 MG TABS Take 250 mg by mouth daily.    . meclizine (ANTIVERT) 25 MG tablet TAKE 1 TABLET TWICE A DAY  AS NEEDED FOR DIZZINESS 180 tablet 1  . Polyethyl Glycol-Propyl Glycol (LUBRICANT EYE DROPS) 0.4-0.3 % SOLN Place 1 drop into both eyes 3 (three) times daily as needed (for dry eyes.).    [name] spironolactone (ALDACTONE) 25 MG tablet TAKE 1/2 TABLET (12.5MG      TOTAL) DAILY 45 tablet 3  . vitamin E 400 UNIT capsule Take 400 Units by mouth daily.    [name] warfarin (JANTOVEN) 3 MG tablet TAKE AS DIRECTED BY THE    COUMADIN CLINIC 100 tablet 1  . enoxaparin (LOVENOX) 60 MG/0.6ML injection Inject 0.6 mLs (60 mg total) into the skin every 12 (twelve) hours. (Patient not taking: Reported on 07/05/2019) 12 mL 1   No facility-administered medications prior to visit.     Per HPI unless specifically indicated in ROS section below Review of Systems Objective:    BP 118/70 (BP Location: Left Arm, Patient Position: Sitting, Cuff Size: Normal)   Pulse 76   Temp 97.8 F (36.6 C) (Temporal)   Ht 5' 2.5" (1.588 m)   Wt 134 lb 7 oz (61 kg)   LMP  (LMP Unknown) Comment: menopause age 56  SpO2 98%   BMI 24.20 kg/m   Wt Readings from Last 3 Encounters:  09/05/19 134 lb 7 oz (61 kg)  08/29/19 133 lb 12.8 oz (60.7 kg)  07/05/19 138 lb 7 oz (62.8 kg)    Physical Exam    Results for orders placed or performed in visit on 08/30/19  CBC with Differential/Platelet  Result Value Ref Range   WBC 5.5 4.0 - 10.5 K/uL   RBC 4.01 3.87 - 5.11 Mil/uL   Hemoglobin 11.9 (L) 12.0 - 15.0 g/dL   HCT 36.2 36.0 - 46.0 %   MCV 90.4 78.0 - 100.0 fl   MCHC 32.9 30.0 - 36.0 g/dL   RDW 15.1 11.5 - 15.5 %   Platelets 221.0 150.0 - 400.0 K/uL   Neutrophils Relative % 43.1 43.0 - 77.0 %   Lymphocytes Relative 39.1 12.0 - 46.0 %   Monocytes Relative 13.6 (H) 3.0 - 12.0 %   Eosinophils Relative 3.5 0.0 - 5.0 %   Basophils Relative 0.7 0.0 - 3.0 %    Neutro Abs 2.4 1.4 - 7.7 K/uL   Lymphs Abs 2.2 0.7 - 4.0 K/uL   Monocytes Absolute 0.8 0.1 - 1.0 K/uL   Eosinophils Absolute 0.2 0.0 - 0.7 K/uL   Basophils Absolute 0.0 0.0 - 0.1 K/uL  Hemoglobin A1c  Result Value Ref Range   Hgb A1c MFr Bld 5.9 4.6 -  6.5 %  VITAMIN D 25 Hydroxy (Vit-D Deficiency, Fractures)  Result Value Ref Range   VITD 53.29 30.00 - 100.00 ng/mL  Comprehensive metabolic panel  Result Value Ref Range   Sodium 134 (L) 135 - 145 mEq/L   Potassium 4.4 3.5 - 5.1 mEq/L   Chloride 102 96 - 112 mEq/L   CO2 27 19 - 32 mEq/L   Glucose, Bld 95 70 - 99 mg/dL   BUN 27 (H) 6 - 23 mg/dL   Creatinine, Ser 1.31 (H) 0.40 - 1.20 mg/dL   Total Bilirubin 0.7 0.2 - 1.2 mg/dL   Alkaline Phosphatase 42 39 - 117 U/L   AST 15 0 - 37 U/L   ALT 9 0 - 35 U/L   Total Protein 7.3 6.0 - 8.3 g/dL   Albumin 4.4 3.5 - 5.2 g/dL   GFR 39.35 (L) >60.00 mL/min   Calcium 9.9 8.4 - 10.5 mg/dL  Lipid panel  Result Value Ref Range   Cholesterol 166 0 - 200 mg/dL   Triglycerides 51.0 0.0 - 149.0 mg/dL   HDL 67.60 >39.00 mg/dL   VLDL 10.2 0.0 - 40.0 mg/dL   LDL Cholesterol 88 0 - 99 mg/dL   Total CHOL/HDL Ratio 2    NonHDL 97.91    Assessment & Plan:  This visit occurred during the SARS-CoV-2 public health emergency.  Safety protocols were in place, including screening questions prior to the visit, additional usage of staff PPE, and extensive cleaning of exam room while observing appropriate contact time as indicated for disinfecting solutions.   Problem List Items Addressed This Visit    Status post mitral valve replacement with tissue valve   Status post biventricular pacemaker    Followed by EP      Prediabetes    Improved. Continue to limit added sugar in diet.       Hypertension    Chronic, stable. Continue current regimen.       History of melanoma    Continue regular derm f/u      History of cerebrovascular accident (CVA) in adulthood    Continue coumadin with lovenox  bridging      Glaucoma    Regularly sees ophtho (Porfilio)      Dyslipidemia    Chronic, will remain off statin.  The 10-year ASCVD risk score [name], et al., 2013) is: 22.6%   Values used to calculate the score:     Age: 75 years     Sex: Female     Is Non-Hispanic African American: No     Diabetic: No     Tobacco smoker: No     Systolic Blood Pressure: 123456 mmHg     Is BP treated: Yes     HDL Cholesterol: 67.6 mg/dL     Total Cholesterol: 166 mg/dL       CKD (chronic kidney disease) stage 3, GFR 30-59 ml/min    Reviewed recent deterioration. Encouraged increasing water as she's not on fluid restriction. Will have her return in 4-6 months for labs only to recheck kidney function.       Chronic systolic heart failure [facility])    Appreciate cardiology care.       Atrial fibrillation (HCC)    Chronic, sounds regular today. She is on warfarin.      Advanced care planning/counseling discussion - Primary    Advanced directive -Scanned 08/2015. Husband [name] then daughter [name] are [place]. Declines prolonged life support if terminal condition.[place] with temporary life  support for reversible condition.POST form completed and in chart.           No orders of the defined types were placed in this encounter.  No orders of the defined types were placed in this encounter.   Patient instructions: Return in 4-6 months for lab visit only to keep an eye on the kidneys.  Increase water intake.  You are doing well today. Return as needed or in 1 year for next wellness visit.   Follow up plan: Return in about 1 year (around 09/04/2020) for medicare wellness visit, follow up visit.  Ria Bush, MD

## 2019-09-05 NOTE — Assessment & Plan Note (Signed)
Chronic, sounds regular today. She is on warfarin.

## 2019-09-05 NOTE — Assessment & Plan Note (Addendum)
Reviewed recent deterioration. Encouraged increasing water as she's not on fluid restriction. Will have her return in 4-6 months for labs only to recheck kidney function.

## 2019-09-05 NOTE — Patient Instructions (Signed)
Return in 4-6 months for lab visit only to keep an eye on the kidneys.  Increase water intake.  You are doing well today. Return as needed or in 1 year for next wellness visit.   Health Maintenance After Age 78 After age 23, you are at a higher risk for certain Eve-term diseases and infections as well as injuries from falls. Falls are a major cause of broken bones and head injuries in people who are older than age 13. Getting regular preventive care can help to keep you healthy and well. Preventive care includes getting regular testing and making lifestyle changes as recommended by your health care provider. Talk with your health care provider about:  Which screenings and tests you should have. A screening is a test that checks for a disease when you have no symptoms.  A diet and exercise plan that is right for you. What should I know about screenings and tests to prevent falls? Screening and testing are the best ways to find a health problem early. Early diagnosis and treatment give you the best chance of managing medical conditions that are common after age 16. Certain conditions and lifestyle choices may make you more likely to have a fall. Your health care provider may recommend:  Regular vision checks. Poor vision and conditions such as cataracts can make you more likely to have a fall. If you wear glasses, make sure to get your prescription updated if your vision changes.  Medicine review. Work with your health care provider to regularly review all of the medicines you are taking, including over-the-counter medicines. Ask your health care provider about any side effects that may make you more likely to have a fall. Tell your health care provider if any medicines that you take make you feel dizzy or sleepy.  Osteoporosis screening. Osteoporosis is a condition that causes the bones to get weaker. This can make the bones weak and cause them to break more easily.  Blood pressure screening.  Blood pressure changes and medicines to control blood pressure can make you feel dizzy.  Strength and balance checks. Your health care provider may recommend certain tests to check your strength and balance while standing, walking, or changing positions.  Foot health exam. Foot pain and numbness, as well as not wearing proper footwear, can make you more likely to have a fall.  Depression screening. You may be more likely to have a fall if you have a fear of falling, feel emotionally low, or feel unable to do activities that you used to do.  Alcohol use screening. Using too much alcohol can affect your balance and may make you more likely to have a fall. What actions can I take to lower my risk of falls? General instructions  Talk with your health care provider about your risks for falling. Tell your health care provider if: ? You fall. Be sure to tell your health care provider about all falls, even ones that seem minor. ? You feel dizzy, sleepy, or off-balance.  Take over-the-counter and prescription medicines only as told by your health care provider. These include any supplements.  Eat a healthy diet and maintain a healthy weight. A healthy diet includes low-fat dairy products, low-fat (lean) meats, and fiber from whole grains, beans, and lots of fruits and vegetables. Home safety  Remove any tripping hazards, such as rugs, cords, and clutter.  Install safety equipment such as grab bars in bathrooms and safety rails on stairs.  Keep rooms and walkways well-lit.  Activity   Follow a regular exercise program to stay fit. This will help you maintain your balance. Ask your health care provider what types of exercise are appropriate for you.  If you need a cane or walker, use it as recommended by your health care provider.  Wear supportive shoes that have nonskid soles. Lifestyle  Do not drink alcohol if your health care provider tells you not to drink.  If you drink alcohol, limit  how much you have: ? 0-1 drink a day for women. ? 0-2 drinks a day for men.  Be aware of how much alcohol is in your drink. In the [place] one drink equals one typical bottle of beer (12 oz), one-half glass of wine (5 oz), or one shot of hard liquor (1 oz).  Do not use any products that contain nicotine or tobacco, such as cigarettes and e-cigarettes. If you need help quitting, ask your health care provider. Summary  Having a healthy lifestyle and getting preventive care can help to protect your health and wellness after age 52.  Screening and testing are the best way to find a health problem early and help you avoid having a fall. Early diagnosis and treatment give you the best chance for managing medical conditions that are more common for people who are older than age 23.  Falls are a major cause of broken bones and head injuries in people who are older than age 3. Take precautions to prevent a fall at home.  Work with your health care provider to learn what changes you can make to improve your health and wellness and to prevent falls. This information is not intended to replace advice given to you by your health care provider. Make sure you discuss any questions you have with your health care provider. Document Revised: 09/30/2018 Document Reviewed: 04/22/2017 Elsevier Patient Education  2020 [employer].

## 2019-09-05 NOTE — Assessment & Plan Note (Signed)
Followed by EP 

## 2019-09-12 ENCOUNTER — Other Ambulatory Visit: Payer: Self-pay

## 2019-09-12 ENCOUNTER — Ambulatory Visit (INDEPENDENT_AMBULATORY_CARE_PROVIDER_SITE_OTHER): Payer: Medicare Other

## 2019-09-12 DIAGNOSIS — Z953 Presence of xenogenic heart valve: Secondary | ICD-10-CM | POA: Diagnosis not present

## 2019-09-12 DIAGNOSIS — I4821 Permanent atrial fibrillation: Secondary | ICD-10-CM

## 2019-09-12 DIAGNOSIS — I4891 Unspecified atrial fibrillation: Secondary | ICD-10-CM

## 2019-09-12 DIAGNOSIS — Z5181 Encounter for therapeutic drug level monitoring: Secondary | ICD-10-CM

## 2019-09-12 LAB — POCT INR: INR: 2.7 (ref 2.0–3.0)

## 2019-09-12 NOTE — Patient Instructions (Addendum)
-   continue current warfarin dosage of 3 mg every day except 4.5 mg on Mondays.  - follow Lovenox bridge instructions:   Tuesday, 3/23: Last dose of Coumadin.  Wednesday, 3/24: No Coumadin or Lovenox.  Thursday, 3/25: Inject Lovenox 60 mg in the fatty abdominal tissue at least 2 inches from the belly button twice a day about 12 hours apart, 8am and 8pm rotate sites. No Coumadin.  Friday, 3/26: Inject Lovenox in the fatty tissue every 12 hours, 8am and 8pm. No Coumadin.  Saturday, 3/27: Inject Lovenox in the fatty tissue every 12 hours, 8am and 8pm. No Coumadin.  Sunday, 3/28: Inject Lovenox in the fatty tissue in the morning at 8 am (No PM dose). No Coumadin.  Monday, 3/29: Procedure Day - No Lovenox - Resume Coumadin in the evening or as directed by doctor (take an extra half tablet with usual dose for 2 days then resume normal dose).  Tuesday, 3/30: Resume Lovenox inject in the fatty tissue every 12 hours and take Coumadin w/ extra 1/2 tablet.  Wednesday, 3/31: Inject Lovenox in the fatty tissue every 12 hours and take Coumadin.  Thursday, 4/1: Inject Lovenox in the fatty tissue every 12 hours and take Coumadin.  Friday, 4/2: Inject Lovenox in the fatty tissue every 12 hours and take Coumadin.  Saturday, 4/3: Inject Lovenox in the fatty tissue every 12 hours and take Coumadin.  Sunday, 4/4: Inject Lovenox in the fatty tissue every 12 hours and take Coumadin.  Monday, 4/5: Coumadin appt to check INR.

## 2019-09-13 ENCOUNTER — Telehealth: Payer: Self-pay

## 2019-09-13 NOTE — Telephone Encounter (Signed)
Patients call has been returned.  She has been advised to start her bowel prep at 5pm the evening before procedure follow mixing instructions.  Reminded her of COVID test.  She said the blood thinner advice was received.  Thanks,  [place], [place]

## 2019-09-14 ENCOUNTER — Other Ambulatory Visit: Payer: Self-pay

## 2019-09-14 DIAGNOSIS — Z953 Presence of xenogenic heart valve: Secondary | ICD-10-CM

## 2019-09-14 DIAGNOSIS — Z7901 Long term (current) use of anticoagulants: Secondary | ICD-10-CM

## 2019-09-14 DIAGNOSIS — Z5181 Encounter for therapeutic drug level monitoring: Secondary | ICD-10-CM

## 2019-09-14 DIAGNOSIS — I4821 Permanent atrial fibrillation: Secondary | ICD-10-CM

## 2019-09-14 NOTE — Progress Notes (Signed)
p 

## 2019-09-15 ENCOUNTER — Other Ambulatory Visit
Admission: RE | Admit: 2019-09-15 | Discharge: 2019-09-15 | Disposition: A | Discharge status: Home / Self Care | Payer: Medicare Other | Source: Ambulatory Visit | Attending: Gastroenterology | Admitting: Gastroenterology

## 2019-09-15 DIAGNOSIS — Z01812 Encounter for preprocedural laboratory examination: Secondary | ICD-10-CM | POA: Diagnosis not present

## 2019-09-15 DIAGNOSIS — Z20822 Contact with and (suspected) exposure to covid-19: Secondary | ICD-10-CM | POA: Insufficient documentation

## 2019-09-16 LAB — SARS CORONAVIRUS 2 (TAT 6-24 HRS): SARS Coronavirus 2: NEGATIVE

## 2019-09-18 ENCOUNTER — Other Ambulatory Visit: Payer: Self-pay | Admitting: Cardiovascular Disease

## 2019-09-18 NOTE — Progress Notes (Signed)
Inr note will not close

## 2019-09-19 ENCOUNTER — Telehealth: Payer: Self-pay

## 2019-09-19 ENCOUNTER — Encounter: Admission: RE | Payer: Self-pay | Source: Home / Self Care

## 2019-09-19 ENCOUNTER — Ambulatory Visit: Admission: RE | Admit: 2019-09-19 | Payer: Medicare Other | Source: Home / Self Care | Admitting: Gastroenterology

## 2019-09-19 SURGERY — COLONOSCOPY WITH PROPOFOL
Anesthesia: General

## 2019-09-19 NOTE — Telephone Encounter (Signed)
Patient calling to confirm today/ tonight medication dose.  Please call

## 2019-09-19 NOTE — Telephone Encounter (Signed)
Noted  

## 2019-09-19 NOTE — Telephone Encounter (Signed)
Pt called to inform me that her husband is not doing well today and she has to take him to the ED, so she is unable to have her colonoscopy as scheduled. Advised her to follow same bridging instructions, except take lovenox injection today. She will resume warfarin as scheduled and have INR check in 1 week.  Pt is appreciative and will call back if we can be of further assistance.

## 2019-09-19 NOTE — Telephone Encounter (Signed)
Lmom for pt to follow instructions that were printed at last ov, clarified for her to take extra 1/2 tablet warfarin tonight and tomorrow, then resume regular dosage. Asked her to call back w/ any questions or concerns.

## 2019-09-26 ENCOUNTER — Other Ambulatory Visit: Payer: Self-pay

## 2019-09-26 ENCOUNTER — Ambulatory Visit (INDEPENDENT_AMBULATORY_CARE_PROVIDER_SITE_OTHER): Payer: Medicare Other | Admitting: *Deleted

## 2019-09-26 ENCOUNTER — Other Ambulatory Visit
Admission: RE | Admit: 2019-09-26 | Discharge: 2019-09-26 | Disposition: A | Discharge status: Home / Self Care | Payer: Medicare Other | Attending: Cardiovascular Disease | Admitting: Cardiovascular Disease

## 2019-09-26 DIAGNOSIS — Z5181 Encounter for therapeutic drug level monitoring: Secondary | ICD-10-CM | POA: Diagnosis not present

## 2019-09-26 DIAGNOSIS — I4821 Permanent atrial fibrillation: Secondary | ICD-10-CM

## 2019-09-26 DIAGNOSIS — Z7901 Long term (current) use of anticoagulants: Secondary | ICD-10-CM | POA: Insufficient documentation

## 2019-09-26 DIAGNOSIS — Z953 Presence of xenogenic heart valve: Secondary | ICD-10-CM

## 2019-09-26 LAB — PROTIME-INR
INR: 1.5 — ABNORMAL HIGH (ref 0.8–1.2)
Prothrombin Time: 18.2 seconds — ABNORMAL HIGH (ref 11.4–15.2)

## 2019-09-26 NOTE — Patient Instructions (Signed)
Description   Spoke with pt and instructed pt to continue Lovenox injections twice a day and take Warfarin 6mg  today and 6mg  tomorrow then continue current warfarin dosage of 3 mg every day except 4.5 mg on Mondays. Recheck INR on Friday, 09/30/2019.

## 2019-09-28 ENCOUNTER — Telehealth: Payer: Self-pay | Admitting: Family Medicine

## 2019-09-28 NOTE — Telephone Encounter (Signed)
Called, left message. Will try again later.

## 2019-09-28 NOTE — Telephone Encounter (Signed)
Pt having increased leg cramps at night. Reports having these most nights of the week.  Takes Magnesium 250mg  OTC and has increased her water intake but she is still having severe leg cramps at night. Is there anything that can recommended or called into the pharmacy?  Pharmacy: [facility] in [place].   Please advise, thanks.

## 2019-09-29 ENCOUNTER — Other Ambulatory Visit: Payer: Self-pay

## 2019-09-29 ENCOUNTER — Ambulatory Visit (INDEPENDENT_AMBULATORY_CARE_PROVIDER_SITE_OTHER): Payer: Medicare Other | Admitting: Cardiovascular Disease

## 2019-09-29 ENCOUNTER — Encounter: Payer: Self-pay | Admitting: Cardiovascular Disease

## 2019-09-29 ENCOUNTER — Other Ambulatory Visit: Payer: Self-pay | Admitting: Cardiovascular Disease

## 2019-09-29 VITALS — BP 120/70 | HR 75 | Ht 62.0 in | Wt 136.2 lb

## 2019-09-29 DIAGNOSIS — I5022 Chronic systolic (congestive) heart failure: Secondary | ICD-10-CM | POA: Diagnosis not present

## 2019-09-29 DIAGNOSIS — I10 Essential (primary) hypertension: Secondary | ICD-10-CM

## 2019-09-29 DIAGNOSIS — I4821 Permanent atrial fibrillation: Secondary | ICD-10-CM | POA: Diagnosis not present

## 2019-09-29 DIAGNOSIS — I059 Rheumatic mitral valve disease, unspecified: Secondary | ICD-10-CM | POA: Diagnosis not present

## 2019-09-29 NOTE — Progress Notes (Signed)
Cardiology Office Note   Date:  09/29/2019   ID:  Elizabeth Hall, Elizabeth Hall 02/09/1941, MRN [id number]  PCP:  Ria Bush, MD  Cardiologist:   Kathlyn Sacramento, MD   Chief Complaint  Patient presents with  . office visit    Pt has no complaints. Meds verbally reviewed w/ pt.      History of Present Illness: Elizabeth Hall is a 79 y.o. female who presents for  a follow-up visit. 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 Congestive heart failure was diagnosed in 2007.  She has known history of atrial fibrillation. She underwent AV nodal ablation and pacemaker placement in November 2013. 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 Her ejection fraction deteriorated and she underwent explantation of the previous pacemaker and implantation of biventricular ICD in March 2015. She had CVA in the past when her warfarin was held for a procedure. Since then, she has been bridged with low molecular weight heparin.  ABI was normal in 02/2016.  She had worsening symptoms of heart failure in 2018.  Echocardiogram at that time showed an EF of 35 to 40% with normal functioning mitral valve prosthesis with a mean gradient of 4 mmHg and moderately dilated left atrium.  A right and left cardiac catheterization in July of 2018 showed mild nonobstructive coronary artery disease.  Ejection fraction was 40%.  Right heart catheterization showed normal LVEDP, normal cardiac output and no significant pulmonary hypertension.  There was no evidence of left-to-right intra-atrial shunt.  There was mildly increased gradient across the mitral valve prosthesis with a mean gradient of 8 mmHg with a valve area of 1.48.  She has been doing reasonably well with no recent chest pain, shortness of breath or palpitations.  No  significant leg edema.  She continues to work part-time with Designer, fashion/clothing at [employer]. Unfortunately, she has been under significant stress given deterioration of her husband's health.   Past Medical History:  Diagnosis Date  . Bleeding of eye   . Chronic systolic heart failure ([place])    a. 11/2016 Echo: EF 35-40%, mild MR w/ nl fxning bioprosthesis, mod dil LA.  . CKD (chronic kidney disease) stage 3, GFR 30-59 ml/min    baseline Cr 1.5-2; prior saw nephrologist thought hypertensive nephropathy/renovascular disease  . CVA (cerebral infarction)    a. 2014 occipital lobe - some vision loss when coumadin held and not bridged  . Diverticulitis 06/2019  . Dyslipidemia   . Glaucoma   . H. pylori infection    treated  . H/O mitral valve replacement    a. 05/2012 s/p 35mm Carpentier Edwards pericardial tissue valve.  [name] Hx of rheumatic fever   . Hypertension   . Melanoma ([place]) 2006   L shoulder  . NICM (nonischemic cardiomyopathy) ([place])    a. 08/2013 s/p Biotronik Ilesto 7 HF1 BiV ICD (ser# [id number]);  b. 11/2016 Echo: EF 35-40%.  . Non-obstructive CAD    a. s/p MI 2001;  b. 12/2016 Cath: LM 20ost, OM2 60, RCA 30p/d, EF 35-45%. Nl R heart filling pressures.  . Permanent atrial fibrillation ([place])    a. 04/2012 s/p AVN & PPM placement (later upgraded to BiV ICD).  [name] Phlebitis   . 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 06/2012 h/o acute ischemia due to thromboembolism radial  and ulnar arteries when coumadin held s/p atherectomy - needs bridging if off coumadin  . Tricuspid regurgitation    a. 05/2012 s/p TV annuloplasty @ time of MVR.    Past Surgical History:  Procedure Laterality Date  . ABLATION  2013  . ATHERECTOMY  06/2012   left arm after coumadin held without bridging  . BREAST BIOPSY    . CARDIOVASCULAR STRESS TEST  03/2016   WNL  . CATARACT EXTRACTION Bilateral 2007, 2010  . COLONOSCOPY  03/2014   diverticulosis, int hem, rpt 5 yrs for h/o polyps ([place])  .  defibrillator     MEDTRONIC 5086 MRI 52 CM LEAD, SERIAL # [id number]  . DEXA  07/2016   WNL  . DG ABDOMEN COMPLETE ([place] HX)  02/2013   HH, mod severe erosive gastritis, duodenitis  . GALLBLADDER SURGERY  2006  . INSERT / REPLACE / REMOVE PACEMAKER  08/2013  . MITRAL VALVE REPLACEMENT  05/2012  . RIGHT/LEFT HEART CATH AND CORONARY ANGIOGRAPHY N/A 12/29/2016   Procedure: Right/Left Heart Cath and Coronary Angiography;  Surgeon: [name], MD;  Location: [facility];  Service: Cardiovascular;  Laterality: N/A;  . TOTAL ABDOMINAL HYSTERECTOMY W/ BILATERAL SALPINGOOPHORECTOMY  1991   irregular bleeding  . TRICUSPID VALVE REPLACEMENT  05/2012  . US ECHOCARDIOGRAPHY  02/2014   EF 40%, LA marked dilation, gen LV hypokinesis, mitral prosthetic valve     Current Outpatient Medications  Medication Sig Dispense Refill  . acetaminophen (TYLENOL) 500 MG tablet Take 500-1,000 mg by mouth every 6 (six) hours as needed (for headaches/pain.).    [name] aspirin EC 81 MG tablet Take 81 mg by mouth daily.    . B Complex Vitamins (VITAMIN B COMPLEX PO) Take by mouth daily.    . bumetanide (BUMEX) 0.5 MG tablet TAKE 1 TABLET DAILY. CONTINUE TO MONITOR WEIGHT, IF YOU START GAINING FLUIDS, THEN INCREASE BACK TO 1MG  DAILY 90 tablet 2  . carvedilol (COREG) 12.5 MG tablet TAKE 1 TABLET TWICE DAILY  WITH MEALS 180 tablet 0  . Cholecalciferol (VITAMIN D) 50 MCG (2000 UT) CAPS Take 1 capsule (2,000 Units total) by mouth daily. 30 capsule   . diphenhydrAMINE (BENADRYL) 25 MG tablet Take 25 mg by mouth as needed.    . enoxaparin (LOVENOX) 60 MG/0.6ML injection Inject 0.6 mLs (60 mg total) into the skin every 12 (twelve) hours. 12 mL 1  . ENTRESTO 24-26 MG TAKE 1 TABLET BY MOUTH TWICE A DAY 180 tablet 0  . LUMIGAN 0.01 % SOLN Place 1 drop into both eyes at bedtime.     . Magnesium 250 MG TABS Take 250 mg by mouth daily.    . meclizine (ANTIVERT) 25 MG tablet TAKE 1 TABLET TWICE A DAY  AS NEEDED FOR DIZZINESS  180 tablet 1  . Polyethyl Glycol-Propyl Glycol (LUBRICANT EYE DROPS) 0.4-0.3 % SOLN Place 1 drop into both eyes 3 (three) times daily as needed (for dry eyes.).    [name] spironolactone (ALDACTONE) 25 MG tablet TAKE 1/2 TABLET (12.5MG      TOTAL) DAILY 45 tablet 3  . vitamin E 400 UNIT capsule Take 400 Units by mouth daily.    [name] warfarin (JANTOVEN) 3 MG tablet TAKE AS DIRECTED BY THE    COUMADIN CLINIC 100 tablet 1   No current facility-administered medications for this visit.    Allergies:   Phytonadione, Amiodarone, Ciprofloxacin, Clarithromycin, Lisinopril, Metronidazole, and Phytonadione [vitamin k1]    Social History:  The patient  reports that she quit smoking about 29 years ago. Her smoking use included cigarettes. She has a 10.00 pack-year smoking history. She has never used smokeless tobacco. She reports current alcohol use. She reports that she does not use drugs.   Family History:  The patient's family history includes Basal cell carcinoma in her daughter; CAD (age of onset: 63) in her father; Cancer in her cousin, cousin, and maternal aunt; Diabetes in her cousin; Heart failure in her brother and sister; Hypertension in her brother; Stroke in her brother and sister.    ROS:  Please see the history of present illness.   Otherwise, review of systems are positive for none.   All other systems are reviewed and negative.    PHYSICAL EXAM: VS:  BP 120/70 (BP Location: Left Arm, Patient Position: Sitting, Cuff Size: Normal)   Pulse 75   Ht 5\' 2"  (1.575 m)   Wt 136 lb 4 oz (61.8 kg)   LMP  (LMP Unknown) Comment: menopause age 70  SpO2 97%   BMI 24.92 kg/m  , BMI Body mass index is 24.92 kg/m. GEN: Well nourished, well developed, in no acute distress  HEENT: normal  Neck: no JVD, carotid bruits, or masses Cardiac: RRR; no murmurs, rubs, or gallops,no edema  Respiratory:  clear to auscultation bilaterally, normal work of breathing GI: soft, nontender, nondistended, + BS MS: no  deformity or atrophy  Skin: warm and dry, no rash Neuro:  Strength and sensation are intact Psych: euthymic mood, full affect   EKG:  EKG ordered today. EKG showed atrial fibrillation with biventricular pacing.  Heart rate is 75 bpm.   Recent Labs: 08/30/2019: ALT 9; BUN 27; Creatinine, Ser 1.31; Hemoglobin 11.9; Platelets 221.0; Potassium 4.4; Sodium 134    Lipid Panel    Component Value Date/Time   CHOL 166 08/30/2019 0832   CHOL 192 06/12/2014 0000   TRIG 51.0 08/30/2019 0832   TRIG 70 06/12/2014 0000   HDL 67.60 08/30/2019 0832   CHOLHDL 2 08/30/2019 0832   VLDL 10.2 08/30/2019 0832   LDLCALC 88 08/30/2019 0832   LDLCALC 104 06/12/2014 0000      Wt Readings from Last 3 Encounters:  09/29/19 136 lb 4 oz (61.8 kg)  09/05/19 134 lb 7 oz (61 kg)  08/29/19 133 lb 12.8 oz (60.7 kg)         ASSESSMENT AND PLAN:  1.  Chronic systolic heart failure:  Most recent ejection fraction was 35-40%.    She appears to be euvolemic and currently on optimal medical therapy.  She is currently on Bumex 0.5 mg daily.  She is on optimal medical therapy including carvedilol, Entresto and spironolactone.  I reviewed her labs done in March which showed mild hyponatremia at 134 with mildly elevated creatinine at 1.31.  I might consider stopping Bumex altogether in the near future..  2. Chronic atrial fibrillation: Status post AV nodal ablation. She is on [place]-term anticoagulation with warfarin.  3. Essential hypertension: Blood pressure is well controlled on current medications.  4. Status post mitral valve replacement with bioprosthetic valve: Acceptable function on most recent cardiac catheterization.  Mildly increased gradient as expected.  Consider repeat echocardiogram later this year.   Disposition:   FU with me in 6 months  Signed,  Kathlyn Sacramento, MD  09/29/2019 1:48 PM    [place]

## 2019-09-29 NOTE — Patient Instructions (Signed)
Medication Instructions:  No changes  *If you need a refill on your cardiac medications before your next appointment, please call your pharmacy*   Lab Work: None If you have labs (blood work) drawn today and your tests are completely normal, you will receive your results only by: . MyChart Message (if you have MyChart) OR . A paper copy in the mail If you have any lab test that is abnormal or we need to change your treatment, we will call you to review the results.   Testing/Procedures: None   Follow-Up: At [facility], you and your health needs are our priority.  As part of our continuing mission to provide you with exceptional heart care, we have created designated Provider Care Teams.  These Care Teams include your primary Cardiologist (physician) and Advanced Practice Providers (APPs -  Physician Assistants and Nurse Practitioners) who all work together to provide you with the care you need, when you need it.  Your next appointment:   6 month(s)  The format for your next appointment:   In Person  Provider:    You may see Elizabeth Arida, MD or one of the following Advanced Practice Providers on your designated Care Team:    Christopher Berge, NP  Ryan Dunn, PA-C  Jacquelyn Visser, PA-C   

## 2019-09-30 ENCOUNTER — Other Ambulatory Visit: Payer: Self-pay

## 2019-09-30 ENCOUNTER — Ambulatory Visit (INDEPENDENT_AMBULATORY_CARE_PROVIDER_SITE_OTHER): Payer: Medicare Other | Admitting: *Deleted

## 2019-09-30 ENCOUNTER — Other Ambulatory Visit
Admission: RE | Admit: 2019-09-30 | Discharge: 2019-09-30 | Disposition: A | Discharge status: Home / Self Care | Payer: Medicare Other | Attending: Cardiovascular Disease | Admitting: Cardiovascular Disease

## 2019-09-30 DIAGNOSIS — Z5181 Encounter for therapeutic drug level monitoring: Secondary | ICD-10-CM

## 2019-09-30 DIAGNOSIS — Z953 Presence of xenogenic heart valve: Secondary | ICD-10-CM | POA: Insufficient documentation

## 2019-09-30 DIAGNOSIS — I4821 Permanent atrial fibrillation: Secondary | ICD-10-CM

## 2019-09-30 LAB — PROTIME-INR
INR: 2 — ABNORMAL HIGH (ref 0.8–1.2)
Prothrombin Time: 22.9 seconds — ABNORMAL HIGH (ref 11.4–15.2)

## 2019-09-30 MED ORDER — ENOXAPARIN SODIUM 60 MG/0.6ML ~~LOC~~ SOLN: 60.0000 mg | Freq: Two times a day (BID) | SUBCUTANEOUS | 1 refills | Status: DC

## 2019-09-30 NOTE — Patient Instructions (Signed)
Description   Spoke with pt and instructed pt to continue Lovenox injections twice a day and take Warfarin 6mg  today and 4.5mg  tomorrow then continue current warfarin dosage of 3 mg every day except 4.5 mg on Mondays. Recheck INR on Monday, 10/03/2019.

## 2019-10-03 ENCOUNTER — Other Ambulatory Visit: Payer: Self-pay

## 2019-10-03 ENCOUNTER — Ambulatory Visit (INDEPENDENT_AMBULATORY_CARE_PROVIDER_SITE_OTHER): Payer: Medicare Other

## 2019-10-03 DIAGNOSIS — Z953 Presence of xenogenic heart valve: Secondary | ICD-10-CM | POA: Diagnosis not present

## 2019-10-03 DIAGNOSIS — I4891 Unspecified atrial fibrillation: Secondary | ICD-10-CM | POA: Diagnosis not present

## 2019-10-03 DIAGNOSIS — I4821 Permanent atrial fibrillation: Secondary | ICD-10-CM

## 2019-10-03 DIAGNOSIS — Z5181 Encounter for therapeutic drug level monitoring: Secondary | ICD-10-CM

## 2019-10-03 LAB — POCT INR: INR: 2.3 (ref 2.0–3.0)

## 2019-10-03 NOTE — Patient Instructions (Signed)
-   STOP Lovenox - take warfarin 6mg  today and 4.5mg  tomorrow then continue current warfarin dosage of 3 mg every day except 4.5 mg on Mondays.  - recheck INR in 4 weeks.

## 2019-10-04 NOTE — Telephone Encounter (Signed)
Spoke with patient, ongoing leg cramping. Some better with increasing fluids.  No improvement on magnesium 250mg  daily.  Encouraged continue fluids. 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  Encouraged loosening covers at foot of bed.

## 2019-10-21 ENCOUNTER — Encounter: Payer: Self-pay | Admitting: Family Medicine

## 2019-10-31 ENCOUNTER — Other Ambulatory Visit: Payer: Self-pay

## 2019-10-31 ENCOUNTER — Ambulatory Visit (INDEPENDENT_AMBULATORY_CARE_PROVIDER_SITE_OTHER): Payer: Medicare Other

## 2019-10-31 ENCOUNTER — Other Ambulatory Visit: Payer: Self-pay | Admitting: Cardiovascular Disease

## 2019-10-31 DIAGNOSIS — Z953 Presence of xenogenic heart valve: Secondary | ICD-10-CM | POA: Diagnosis not present

## 2019-10-31 DIAGNOSIS — I4891 Unspecified atrial fibrillation: Secondary | ICD-10-CM

## 2019-10-31 DIAGNOSIS — Z5181 Encounter for therapeutic drug level monitoring: Secondary | ICD-10-CM | POA: Diagnosis not present

## 2019-10-31 DIAGNOSIS — I4821 Permanent atrial fibrillation: Secondary | ICD-10-CM | POA: Diagnosis not present

## 2019-10-31 LAB — POCT INR: INR: 2.3 (ref 2.0–3.0)

## 2019-10-31 NOTE — Progress Notes (Signed)
Pt's husband, [name], passed away at [facility] on 10/17/19.  Pt was very tearful at today's visit.  She shared her feelings, memories, and plans for scattering his ashes at his favorite places.

## 2019-10-31 NOTE — Patient Instructions (Signed)
-   START NEW warfarin dosage of 3 mg every day except 6 mg on Mondays - recheck INR in 4 weeks. - Call me when you get rescheduled for your colonoscopy and I'll give you new instructions for Lovenox bridge.

## 2019-11-01 ENCOUNTER — Telehealth: Payer: Self-pay

## 2019-11-01 NOTE — Telephone Encounter (Signed)
Returned patients call to reschedule her colonoscopy.  LVM for her to call me back to reschedule with Dr. [name].  Thanks,  [place], [place]

## 2019-11-02 ENCOUNTER — Telehealth: Payer: Self-pay | Admitting: Cardiovascular Disease

## 2019-11-02 ENCOUNTER — Telehealth: Payer: Self-pay

## 2019-11-02 ENCOUNTER — Other Ambulatory Visit: Payer: Self-pay

## 2019-11-02 DIAGNOSIS — Z86010 Personal history of colonic polyps: Secondary | ICD-10-CM

## 2019-11-02 MED ORDER — PEG 3350-KCL-NA BICARB-NACL 420 G PO SOLR: 4000.0000 mL | Freq: Once | ORAL | 0 refills | Status: AC

## 2019-11-02 NOTE — Telephone Encounter (Signed)
Thank you. Will await recommendations from appointment in [place].   KL

## 2019-11-02 NOTE — Telephone Encounter (Addendum)
Due to stroke history, patient may hold warfarin for 5 days prior to procedure WITH a lovenox bridge.   We will need to move her coumadin appointment up to accommodate this. I will send message to [facility] is [place] to coordinate.  crcl 28 ml/min using IBW, 50ml.min actually BW plt 221 Would consider getting updated scr prior to lovenox dosing as last scr was a little out of trend

## 2019-11-02 NOTE — Telephone Encounter (Signed)
Elizabeth Hall has hx of mitral valve replacement with a 29 mm Carpentier Edwards pericardial valve and tricuspid valve annuloplasty in December 2013. She had CVA in the past when her warfarin was held for a procedure. Since then, she has been bridged with low molecular weight heparin.  Seen last by Dr. Fletcher Anon 10/12/2019. Will route to pharmacy for recommendations concerning LMWH bridging.   KL

## 2019-11-02 NOTE — Telephone Encounter (Signed)
Patient wanted to let you know her colonoscopy has been rescheduled for June 1st

## 2019-11-02 NOTE — Telephone Encounter (Signed)
Pt was previously given clearance to proceed w/ colonoscopy and given instructions for holding warfarin and Lovenox bridge, but this has been rescheduled twice.  Received message from GI that they are resending clearance request. Will await those recommendations and proceed accordingly.

## 2019-11-02 NOTE — Telephone Encounter (Signed)
Sent message to Dr.McAlhany to request recommendations on holding Plavix.

## 2019-11-02 NOTE — Telephone Encounter (Signed)
   [facility] Pre-operative Risk Assessment    Request for surgical clearance:  1. What type of surgery is being performed? Colonoscopy   2. When is this surgery scheduled? 11/22/19  3. What type of clearance is required (medical clearance vs. Pharmacy clearance to hold med vs. Both)? both  4. Are there any medications that need to be held prior to surgery and how [place]? Lovenox injections and warfarin instructions   5. Practice name and name of physician performing surgery? [facility] - Dr [name]  6. What is your office phone number [phone]   7.   What is your office fax number [phone] ATTN Driscilla Grammes  8.   Anesthesia type (None, local, MAC, general) ? Not listed   Ace Gins 11/02/2019, 3:28 PM  _________________________________________________________________   (provider comments below)

## 2019-11-07 NOTE — Telephone Encounter (Signed)
Pt sched for INR check and Lovenox teaching on 11/09/19.

## 2019-11-08 ENCOUNTER — Telehealth: Payer: Self-pay

## 2019-11-08 ENCOUNTER — Other Ambulatory Visit: Payer: Self-pay

## 2019-11-08 ENCOUNTER — Ambulatory Visit (INDEPENDENT_AMBULATORY_CARE_PROVIDER_SITE_OTHER): Payer: Medicare Other | Admitting: *Deleted

## 2019-11-08 DIAGNOSIS — I4821 Permanent atrial fibrillation: Secondary | ICD-10-CM

## 2019-11-08 DIAGNOSIS — R1013 Epigastric pain: Secondary | ICD-10-CM

## 2019-11-08 DIAGNOSIS — I5022 Chronic systolic (congestive) heart failure: Secondary | ICD-10-CM | POA: Diagnosis not present

## 2019-11-08 LAB — CUP PACEART REMOTE DEVICE CHECK
Date Time Interrogation Session: 20210518133825
Implantable Lead Implant Date: 20150316
Implantable Lead Implant Date: 20150316
Implantable Lead Location: 753858
Implantable Lead Location: 753860
Implantable Lead Model: 346
Implantable Lead Serial Number: 25130583
Implantable Pulse Generator Implant Date: 20150316
Pulse Gen Model: 383547
Pulse Gen Serial Number: 60765179

## 2019-11-08 NOTE — Telephone Encounter (Signed)
EGD has been requested by patient due to epigastric pain.  Discussed with Ginger, our Lead CMA, she said that we could add it on.  Thanks,  [place], [place]

## 2019-11-09 ENCOUNTER — Ambulatory Visit (INDEPENDENT_AMBULATORY_CARE_PROVIDER_SITE_OTHER): Payer: Medicare Other

## 2019-11-09 ENCOUNTER — Other Ambulatory Visit: Payer: Self-pay

## 2019-11-09 DIAGNOSIS — Z5181 Encounter for therapeutic drug level monitoring: Secondary | ICD-10-CM | POA: Diagnosis not present

## 2019-11-09 DIAGNOSIS — Z7189 Other specified counseling: Secondary | ICD-10-CM | POA: Diagnosis not present

## 2019-11-09 DIAGNOSIS — Z953 Presence of xenogenic heart valve: Secondary | ICD-10-CM

## 2019-11-09 DIAGNOSIS — I4821 Permanent atrial fibrillation: Secondary | ICD-10-CM | POA: Diagnosis not present

## 2019-11-09 DIAGNOSIS — H401233 Low-tension glaucoma, bilateral, severe stage: Secondary | ICD-10-CM | POA: Diagnosis not present

## 2019-11-09 DIAGNOSIS — I4891 Unspecified atrial fibrillation: Secondary | ICD-10-CM

## 2019-11-09 LAB — POCT INR: INR: 3.1 — AB (ref 2.0–3.0)

## 2019-11-09 MED ORDER — ENOXAPARIN SODIUM 60 MG/0.6ML ~~LOC~~ SOLN: 60.0000 mg | Freq: Two times a day (BID) | SUBCUTANEOUS | 1 refills | Status: DC

## 2019-11-09 NOTE — Telephone Encounter (Signed)
   Primary Cardiologist: Kathlyn Sacramento, MD  Chart reviewed as part of pre-operative protocol coverage. Patient was contacted 11/09/2019 in reference to pre-operative risk assessment for pending surgery as outlined below.  Elizabeth Hall was last seen on 09/29/19 by Dr. Fletcher Anon.  Since that day, Elizabeth Hall has done well. She can complete more than 4.0 METS without angina. She has coordinated lovenox bridge with our coumadin clinic.   Therefore, based on ACC/AHA guidelines, the patient would be at acceptable risk for the planned procedure without further cardiovascular testing.   I will route this recommendation to the requesting party via Epic fax function and remove from pre-op pool.  Please call with questions.  [place], [place] 11/09/2019, 5:05 PM

## 2019-11-09 NOTE — Patient Instructions (Addendum)
-   have a serving of greens today - continue warfarin dosage of 3 mg every day except 6 mg on Mondays - Follow Lovenox bridge instructions:   Wednesday, May 26: Last dose of warfarin.  Thursday, May 27: No wafarin or Lovenox.  Friday, My 28: Inject Lovenox 60 mg in the fatty abdominal tissue at least 2 inches from the belly button twice a day about 12 hours apart, 8am and 8pm rotate sites. No warfarin.  Saturday, May 29:Inject Lovenox in the fatty tissue every 12 hours, 8am and 8pm. No warfarin.  Sunday, May 30: Inject Lovenox in the fatty tissue every 12 hours, 8am and 8pm. No warfarin.  Monday, May 31: Inject Lovenox in the fatty tissue in the morning at 8 am (No PM dose). No warfarin.  Tuesday, June 1: Procedure Day - No Lovenox - Resume warfarin in the evening or as directed by doctor (take an extra half tablet with usual dose for 2 days then resume normal dose).  Wednesday, June 2: Resume Lovenox inject in the fatty tissue every 12 hours and take warfarin (with extra 1/2 dose)  Thursday, June 3: Inject Lovenox in the fatty tissue every 12 hours and take warfarin.  Friday, June 4: Inject Lovenox in the fatty tissue every 12 hours and take warfarin.  Saturday, June 5: Inject Lovenox in the fatty tissue every 12 hours and take warfarin.  Sunday, June 6: Inject Lovenox in the fatty tissue every 12 hours and take warfarin.  Monday, June 7: warfarin appt to check INR.

## 2019-11-10 NOTE — Progress Notes (Signed)
Remote ICD transmission.   

## 2019-11-14 ENCOUNTER — Telehealth: Payer: Self-pay

## 2019-11-14 DIAGNOSIS — N289 Disorder of kidney and ureter, unspecified: Secondary | ICD-10-CM | POA: Insufficient documentation

## 2019-11-14 DIAGNOSIS — Z6825 Body mass index (BMI) 25.0-25.9, adult: Secondary | ICD-10-CM | POA: Diagnosis not present

## 2019-11-14 DIAGNOSIS — Z01419 Encounter for gynecological examination (general) (routine) without abnormal findings: Secondary | ICD-10-CM | POA: Diagnosis not present

## 2019-11-14 DIAGNOSIS — K449 Diaphragmatic hernia without obstruction or gangrene: Secondary | ICD-10-CM | POA: Insufficient documentation

## 2019-11-14 DIAGNOSIS — I809 Phlebitis and thrombophlebitis of unspecified site: Secondary | ICD-10-CM | POA: Insufficient documentation

## 2019-11-14 DIAGNOSIS — C439 Malignant melanoma of skin, unspecified: Secondary | ICD-10-CM | POA: Insufficient documentation

## 2019-11-14 DIAGNOSIS — Z1231 Encounter for screening mammogram for malignant neoplasm of breast: Secondary | ICD-10-CM | POA: Diagnosis not present

## 2019-11-14 NOTE — Telephone Encounter (Signed)
Spoke w/ pt. Reviewed Lovenox & warfarin instructions.  Asked her to call back w/ any further questions or concerns.

## 2019-11-14 NOTE — Telephone Encounter (Signed)
Patient is calling to clarify her Lovenox/warfarin for the date of her procedure

## 2019-11-18 ENCOUNTER — Other Ambulatory Visit
Admission: RE | Admit: 2019-11-18 | Discharge: 2019-11-18 | Disposition: A | Discharge status: Home / Self Care | Payer: Medicare Other | Source: Ambulatory Visit | Attending: Gastroenterology | Admitting: Gastroenterology

## 2019-11-18 ENCOUNTER — Other Ambulatory Visit: Payer: Self-pay

## 2019-11-18 DIAGNOSIS — Z20822 Contact with and (suspected) exposure to covid-19: Secondary | ICD-10-CM | POA: Diagnosis not present

## 2019-11-18 DIAGNOSIS — Z01812 Encounter for preprocedural laboratory examination: Secondary | ICD-10-CM | POA: Insufficient documentation

## 2019-11-19 LAB — SARS CORONAVIRUS 2 (TAT 6-24 HRS): SARS Coronavirus 2: NEGATIVE

## 2019-11-22 ENCOUNTER — Encounter
Admission: RE | Disposition: A | Discharge status: Home / Self Care | Payer: Self-pay | Source: Home / Self Care | Attending: Gastroenterology

## 2019-11-22 ENCOUNTER — Ambulatory Visit: Payer: Medicare Other | Admitting: Certified Registered Nurse Anesthetist

## 2019-11-22 ENCOUNTER — Encounter: Payer: Self-pay | Admitting: Gastroenterology

## 2019-11-22 ENCOUNTER — Other Ambulatory Visit: Payer: Self-pay

## 2019-11-22 ENCOUNTER — Ambulatory Visit
Admission: RE | Admit: 2019-11-22 | Discharge: 2019-11-22 | Disposition: A | Discharge status: Home / Self Care | Payer: Medicare Other | Attending: Gastroenterology | Admitting: Gastroenterology

## 2019-11-22 DIAGNOSIS — I809 Phlebitis and thrombophlebitis of unspecified site: Secondary | ICD-10-CM | POA: Insufficient documentation

## 2019-11-22 DIAGNOSIS — K5790 Diverticulosis of intestine, part unspecified, without perforation or abscess without bleeding: Secondary | ICD-10-CM | POA: Diagnosis not present

## 2019-11-22 DIAGNOSIS — K2970 Gastritis, unspecified, without bleeding: Secondary | ICD-10-CM | POA: Diagnosis not present

## 2019-11-22 DIAGNOSIS — I252 Old myocardial infarction: Secondary | ICD-10-CM | POA: Insufficient documentation

## 2019-11-22 DIAGNOSIS — Z87891 Personal history of nicotine dependence: Secondary | ICD-10-CM | POA: Insufficient documentation

## 2019-11-22 DIAGNOSIS — K648 Other hemorrhoids: Secondary | ICD-10-CM | POA: Insufficient documentation

## 2019-11-22 DIAGNOSIS — Z860100 Personal history of colon polyps, unspecified: Secondary | ICD-10-CM

## 2019-11-22 DIAGNOSIS — R1013 Epigastric pain: Secondary | ICD-10-CM | POA: Insufficient documentation

## 2019-11-22 DIAGNOSIS — E785 Hyperlipidemia, unspecified: Secondary | ICD-10-CM | POA: Insufficient documentation

## 2019-11-22 DIAGNOSIS — N183 Chronic kidney disease, stage 3 unspecified: Secondary | ICD-10-CM | POA: Diagnosis not present

## 2019-11-22 DIAGNOSIS — Z1211 Encounter for screening for malignant neoplasm of colon: Secondary | ICD-10-CM | POA: Diagnosis not present

## 2019-11-22 DIAGNOSIS — I428 Other cardiomyopathies: Secondary | ICD-10-CM | POA: Insufficient documentation

## 2019-11-22 DIAGNOSIS — Z7901 Long term (current) use of anticoagulants: Secondary | ICD-10-CM | POA: Insufficient documentation

## 2019-11-22 DIAGNOSIS — K2960 Other gastritis without bleeding: Secondary | ICD-10-CM | POA: Diagnosis not present

## 2019-11-22 DIAGNOSIS — I5022 Chronic systolic (congestive) heart failure: Secondary | ICD-10-CM | POA: Insufficient documentation

## 2019-11-22 DIAGNOSIS — Z7982 Long term (current) use of aspirin: Secondary | ICD-10-CM | POA: Insufficient documentation

## 2019-11-22 DIAGNOSIS — Z8673 Personal history of transient ischemic attack (TIA), and cerebral infarction without residual deficits: Secondary | ICD-10-CM | POA: Insufficient documentation

## 2019-11-22 DIAGNOSIS — I2510 Atherosclerotic heart disease of native coronary artery without angina pectoris: Secondary | ICD-10-CM | POA: Diagnosis not present

## 2019-11-22 DIAGNOSIS — I130 Hypertensive heart and chronic kidney disease with heart failure and stage 1 through stage 4 chronic kidney disease, or unspecified chronic kidney disease: Secondary | ICD-10-CM | POA: Diagnosis not present

## 2019-11-22 DIAGNOSIS — Z881 Allergy status to other antibiotic agents status: Secondary | ICD-10-CM | POA: Diagnosis not present

## 2019-11-22 DIAGNOSIS — I4821 Permanent atrial fibrillation: Secondary | ICD-10-CM | POA: Diagnosis not present

## 2019-11-22 DIAGNOSIS — H409 Unspecified glaucoma: Secondary | ICD-10-CM | POA: Diagnosis not present

## 2019-11-22 DIAGNOSIS — K449 Diaphragmatic hernia without obstruction or gangrene: Secondary | ICD-10-CM | POA: Diagnosis not present

## 2019-11-22 DIAGNOSIS — K5730 Diverticulosis of large intestine without perforation or abscess without bleeding: Secondary | ICD-10-CM | POA: Insufficient documentation

## 2019-11-22 DIAGNOSIS — Z8249 Family history of ischemic heart disease and other diseases of the circulatory system: Secondary | ICD-10-CM | POA: Insufficient documentation

## 2019-11-22 DIAGNOSIS — Z79899 Other long term (current) drug therapy: Secondary | ICD-10-CM | POA: Diagnosis not present

## 2019-11-22 DIAGNOSIS — Z888 Allergy status to other drugs, medicaments and biological substances status: Secondary | ICD-10-CM | POA: Insufficient documentation

## 2019-11-22 DIAGNOSIS — Z823 Family history of stroke: Secondary | ICD-10-CM | POA: Insufficient documentation

## 2019-11-22 HISTORY — PX: ESOPHAGOGASTRODUODENOSCOPY (EGD) WITH PROPOFOL: SHX5813

## 2019-11-22 HISTORY — PX: COLONOSCOPY WITH PROPOFOL: SHX5780

## 2019-11-22 SURGERY — COLONOSCOPY WITH PROPOFOL
Anesthesia: General

## 2019-11-22 MED ORDER — LIDOCAINE HCL (PF) 2 % IJ SOLN
INTRAMUSCULAR | Status: AC
  Filled 2019-11-22: qty 5

## 2019-11-22 MED ORDER — PROPOFOL 500 MG/50ML IV EMUL
INTRAVENOUS | Status: DC | PRN
  Administered 2019-11-22: 110 ug/kg/min via INTRAVENOUS

## 2019-11-22 MED ORDER — EPHEDRINE SULFATE 50 MG/ML IJ SOLN
INTRAMUSCULAR | Status: DC | PRN
  Administered 2019-11-22 (×2): 10 mg via INTRAVENOUS

## 2019-11-22 MED ORDER — LIDOCAINE HCL (CARDIAC) PF 100 MG/5ML IV SOSY
PREFILLED_SYRINGE | INTRAVENOUS | Status: DC | PRN
  Administered 2019-11-22: 50 mg via INTRAVENOUS

## 2019-11-22 MED ORDER — PHENYLEPHRINE HCL (PRESSORS) 10 MG/ML IV SOLN
INTRAVENOUS | Status: AC
  Filled 2019-11-22: qty 1

## 2019-11-22 MED ORDER — PROPOFOL 500 MG/50ML IV EMUL
INTRAVENOUS | Status: AC
  Filled 2019-11-22: qty 50

## 2019-11-22 MED ORDER — EPHEDRINE 5 MG/ML INJ
INTRAVENOUS | Status: AC
  Filled 2019-11-22: qty 10

## 2019-11-22 MED ORDER — SODIUM CHLORIDE 0.9 % IV SOLN: INTRAVENOUS | Status: DC

## 2019-11-22 MED ORDER — PROPOFOL 10 MG/ML IV BOLUS
INTRAVENOUS | Status: DC | PRN
  Administered 2019-11-22: 40 mg via INTRAVENOUS
  Administered 2019-11-22: 10 mg via INTRAVENOUS

## 2019-11-22 NOTE — Op Note (Signed)
[facility] Patient Name: Elizabeth Hall Procedure Date: 11/22/2019 8:56 AM MRN: [id number] Account #: [id number] Date of Birth: [birth year] Admit Type: Outpatient Age: 79 Room: [facility] ENDO ROOM 4 Gender: Female Note Status: Finalized Procedure:             Colonoscopy Indications:           High risk colon cancer surveillance: Personal history                         of colonic polyps Providers:             [name] MD, MD Referring MD:          Ria Bush (Referring MD) Medicines:             Propofol per Anesthesia Complications:         No immediate complications. Procedure:             Pre-Anesthesia Assessment:                        - Prior to the procedure, a History and Physical was                         performed, and patient medications and allergies were                         reviewed. The patient's tolerance of previous                         anesthesia was also reviewed. The risks and benefits                         of the procedure and the sedation options and risks                         were discussed with the patient. All questions were                         answered, and informed consent was obtained. Prior                         Anticoagulants: The patient has taken Coumadin                         (warfarin), last dose was 5 days prior to procedure.                         After reviewing the risks and benefits, the patient                         was deemed in satisfactory condition to undergo the                         procedure.                        After obtaining informed consent, the colonoscope was  passed under direct vision. Throughout the procedure,                         the patient's blood pressure, pulse, and oxygen                         saturations were monitored continuously. The                         Colonoscope was introduced through the anus and       advanced to the the cecum, identified by appendiceal                         orifice and ileocecal valve. The colonoscopy was                         performed without difficulty. The patient tolerated                         the procedure well. The quality of the bowel                         preparation was excellent. Findings:      The perianal and digital rectal examinations were normal.      Multiple small-mouthed diverticula were found in the sigmoid colon.      Non-bleeding internal hemorrhoids were found during retroflexion. The       hemorrhoids were Grade I (internal hemorrhoids that do not prolapse). Impression:            - Diverticulosis in the sigmoid colon.                        - Non-bleeding internal hemorrhoids.                        - No specimens collected. Recommendation:        - Discharge patient to home.                        - Resume previous diet.                        - Continue present medications. Procedure Code(s):     --- Professional ---                        [phone], Colonoscopy, flexible; diagnostic, including                         collection of specimen(s) by brushing or washing, when                         performed (separate procedure) Diagnosis Code(s):     --- Professional ---                        Z86.010, Personal history of colonic polyps CPT copyright 2019 American Medical Association. All rights reserved. The codes documented in this report are preliminary and upon coder review may  be revised to meet current compliance requirements. [name] MD, MD 11/22/2019 9:26:12 AM This  report has been signed electronically. Number of Addenda: 0 Note Initiated On: 11/22/2019 8:56 AM Scope Withdrawal Time: 0 hours 6 minutes 15 seconds  Total Procedure Duration: 0 hours 8 minutes 58 seconds  Estimated Blood Loss:  Estimated blood loss: none.      [facility]

## 2019-11-22 NOTE — Anesthesia Preprocedure Evaluation (Signed)
Anesthesia Evaluation  Patient identified by MRN, date of birth, ID band Patient awake    Reviewed: Allergy & Precautions, NPO status , Patient's Chart, lab work & pertinent test results  History of Anesthesia Complications Negative for: history of anesthetic complications  Airway Mallampati: II  TM Distance: >3 FB Neck ROM: Full    Dental  (+) Upper Dentures, Lower Dentures   Pulmonary neg pulmonary ROS, neg sleep apnea, neg COPD, Patient abstained from smoking.Not current smoker, former smoker,    Pulmonary exam normal breath sounds clear to auscultation       Cardiovascular Exercise Tolerance: Good METShypertension, + CAD, + Past MI and + Peripheral Vascular Disease  + dysrhythmias + pacemaker + Cardiac Defibrillator + Valvular Problems/Murmurs  Rhythm:Regular Rate:Normal - Systolic murmurs TTE [id number]: - Left ventricle: The cavity size was normal. There was mild  concentric hypertrophy. Systolic function was moderately reduced.  The estimated ejection fraction was in the range of 35% to 40%.  Regional wall motion abnormalities cannot be excluded. The study  is not technically sufficient to allow evaluation of LV diastolic  function.  - Mitral valve: Calcified annulus. A bioprosthesis was present and  functioning normally. There was mild regurgitation. Mean gradient  (D): 4 mm Hg.  - Left atrium: The atrium was moderately dilated.  - Pulmonary arteries: Systolic pressure could not be accurately  estimated.    Neuro/Psych CVA, No Residual Symptoms negative psych ROS   GI/Hepatic neg GERD  ,(+)     (-) substance abuse  ,   Endo/Other  neg diabetes  Renal/GU CRFRenal disease     Musculoskeletal   Abdominal   Peds  Hematology   Anesthesia Other Findings Past Medical History: No date: Bleeding of eye No date: Chronic systolic heart failure ([place])     Comment:  a. 11/2016 Echo: EF 35-40%, mild MR  w/ nl fxning               bioprosthesis, mod dil LA. No date: CKD (chronic kidney disease) stage 3, GFR 30-59 ml/min     Comment:  baseline Cr 1.5-2; prior saw nephrologist thought               hypertensive nephropathy/renovascular disease No date: CVA (cerebral infarction)     Comment:  a. 2014 occipital lobe - some vision loss when coumadin               held and not bridged 06/2019: Diverticulitis No date: Dyslipidemia No date: Glaucoma No date: H. pylori infection     Comment:  treated No date: H/O mitral valve replacement     Comment:  a. 05/2012 s/p 87mm Carpentier Edwards pericardial               tissue valve. No date: Hx of rheumatic fever No date: Hypertension 2006: Melanoma ([place])     Comment:  L shoulder No date: NICM (nonischemic cardiomyopathy) ([place])     Comment:  a. 08/2013 s/p Biotronik Ilesto 7 HF1 BiV ICD (ser#               [id number]);  b. 11/2016 Echo: EF 35-40%. No date: Non-obstructive CAD     Comment:  a. s/p MI 2001;  b. 12/2016 Cath: LM 20ost, OM2 60, RCA               30p/d, EF 35-45%. Nl R heart filling pressures. No date: Permanent atrial fibrillation ([place])     Comment:  a. 04/2012 s/p AVN &  PPM placement (later upgraded to               BiV ICD). No date: Phlebitis No date: Stroke [facility]) No date: Thromboembolism of upper extremity artery ([place])     Comment:  a. 06/2012 h/o acute ischemia due to thromboembolism               radial and ulnar arteries when coumadin held s/p               atherectomy - needs bridging if off coumadin No date: Tricuspid regurgitation     Comment:  a. 05/2012 s/p TV annuloplasty @ time of MVR.  Reproductive/Obstetrics                             Anesthesia Physical Anesthesia Plan  ASA: III  Anesthesia Plan: General   Post-op Pain Management:    Induction: Intravenous  PONV Risk Score and Plan: 3 and Ondansetron, Propofol infusion and TIVA  Airway Management Planned: Nasal  Cannula  Additional Equipment: None  Intra-op Plan:   Post-operative Plan:   Informed Consent: I have reviewed the patients History and Physical, chart, labs and discussed the procedure including the risks, benefits and alternatives for the proposed anesthesia with the patient or authorized representative who has indicated his/her understanding and acceptance.     Dental advisory given  Plan Discussed with: CRNA and Surgeon  Anesthesia Plan Comments: (Discussed risks of anesthesia with patient, including possibility of difficulty with spontaneous ventilation under anesthesia necessitating airway intervention, PONV, and rare risks such as cardiac or respiratory or neurological events. Patient understands. Patient counseled on being higher risk for anesthesia due to comorbidities: CHF, prosthetic valves on AC. Patient was told about increased risk of cardiac and respiratory events, including death. Patient understands. )        Anesthesia Quick Evaluation

## 2019-11-22 NOTE — H&P (Signed)
Elizabeth Lame, MD [facility] [address], [place], [place] [address] Phone:[phone] Fax : [phone]  Primary Care Physician:  Ria Bush, MD Primary Gastroenterologist:  Dr. [name]  Pre-Procedure History & Physical: HPI:  Elizabeth Hall is a 79 y.o. female is here for an endoscopy and colonoscopy.   Past Medical History:  Diagnosis Date  . Bleeding of eye   . Chronic systolic heart failure ([place])    a. 11/2016 Echo: EF 35-40%, mild MR w/ nl fxning bioprosthesis, mod dil LA.  . CKD (chronic kidney disease) stage 3, GFR 30-59 ml/min    baseline Cr 1.5-2; prior saw nephrologist thought hypertensive nephropathy/renovascular disease  . CVA (cerebral infarction)    a. 2014 occipital lobe - some vision loss when coumadin held and not bridged  . Diverticulitis 06/2019  . Dyslipidemia   . Glaucoma   . H. pylori infection    treated  . H/O mitral valve replacement    a. 05/2012 s/p 63mm Carpentier Edwards pericardial tissue valve.  [name] Hx of rheumatic fever   . Hypertension   . Melanoma ([place]) 2006   L shoulder  . NICM (nonischemic cardiomyopathy) ([place])    a. 08/2013 s/p Biotronik Ilesto 7 HF1 BiV ICD (ser# [id number]);  b. 11/2016 Echo: EF 35-40%.  . Non-obstructive CAD    a. s/p MI 2001;  b. 12/2016 Cath: LM 20ost, OM2 60, RCA 30p/d, EF 35-45%. Nl R heart filling pressures.  . Permanent atrial fibrillation ([place])    a. 04/2012 s/p AVN & PPM placement (later upgraded to BiV ICD).  [name] Phlebitis   . Stroke ([place])   . Thromboembolism of upper extremity artery ([place])    a. 06/2012 h/o acute ischemia due to thromboembolism radial and ulnar arteries when coumadin held s/p atherectomy - needs bridging if off coumadin  . Tricuspid regurgitation    a. 05/2012 s/p TV annuloplasty @ time of MVR.    Past Surgical History:  Procedure Laterality Date  . ABDOMINAL HYSTERECTOMY    . ABLATION  2013  . ATHERECTOMY  06/2012   left arm after coumadin held without bridging  . BREAST  BIOPSY    . CARDIOVASCULAR STRESS TEST  03/2016   WNL  . CATARACT EXTRACTION Bilateral 2007, 2010  . CHOLECYSTECTOMY    . COLONOSCOPY  03/2014   diverticulosis, int hem, rpt 5 yrs for h/o polyps ([place])  . defibrillator     MEDTRONIC 5086 MRI 52 CM LEAD, SERIAL # [id number]  . DEXA  07/2016   WNL  . DG ABDOMEN COMPLETE ([place] [facility])  02/2013   HH, mod severe erosive gastritis, duodenitis  . GALLBLADDER SURGERY  2006  . INSERT / REPLACE / REMOVE PACEMAKER  08/2013  . MITRAL VALVE REPLACEMENT  05/2012  . RIGHT/LEFT HEART CATH AND CORONARY ANGIOGRAPHY N/A 12/29/2016   Procedure: Right/Left Heart Cath and Coronary Angiography;  Surgeon: [name], MD;  Location: [facility];  Service: Cardiovascular;  Laterality: N/A;  . TOTAL ABDOMINAL HYSTERECTOMY W/ BILATERAL SALPINGOOPHORECTOMY  1991   irregular bleeding  . TRICUSPID VALVE REPLACEMENT  05/2012  . US ECHOCARDIOGRAPHY  02/2014   EF 40%, LA marked dilation, gen LV hypokinesis, mitral prosthetic valve    Prior to Admission medications   Medication Sig Start Date End Date Taking? Authorizing Provider  acetaminophen (TYLENOL) 500 MG tablet Take 500-1,000 mg by mouth every 6 (six) hours as needed (for headaches/pain.).   Yes [provider]  aspirin EC 81 MG  tablet Take 81 mg by mouth daily.   Yes [provider]  B Complex Vitamins (VITAMIN B COMPLEX PO) Take by mouth daily.   Yes [provider]  bumetanide (BUMEX) 0.5 MG tablet TAKE 1 TABLET DAILY. CONTINUE TO MONITOR WEIGHT, IF YOU START GAINING FLUIDS, THEN INCREASE BACK TO 1MG  DAILY 05/09/19  Yes [name], MD  carvedilol (COREG) 12.5 MG tablet TAKE 1 TABLET TWICE DAILY  WITH MEALS 10/31/19  Yes [name], MD  Cholecalciferol (VITAMIN D) 50 MCG (2000 UT) CAPS Take 1 capsule (2,000 Units total) by mouth daily. 11/24/18  Yes Ria Bush, MD  diphenhydrAMINE (BENADRYL) 25 MG tablet Take 25 mg by mouth as needed.   Yes [provider]  enoxaparin (LOVENOX) 60 MG/0.6ML injection Inject 0.6 mLs (60 mg total) into the skin every 12 (twelve) hours. 11/09/19  Yes End, Harrell Gave, MD  ENTRESTO 24-26 MG TAKE 1 TABLET BY MOUTH TWICE A DAY 09/19/19  Yes Arida, Muhammad A, MD  LUMIGAN 0.01 % SOLN Place 1 drop into both eyes at bedtime.  10/23/17  Yes [provider]  Magnesium 250 MG TABS Take 250 mg by mouth daily.   Yes [provider]  meclizine (ANTIVERT) 25 MG tablet TAKE 1 TABLET TWICE A DAY  AS NEEDED FOR DIZZINESS 05/18/19  Yes [name], MD  Polyethyl Glycol-Propyl Glycol (LUBRICANT EYE DROPS) 0.4-0.3 % SOLN Place 1 drop into both eyes 3 (three) times daily as needed (for dry eyes.).   Yes [provider]  spironolactone (ALDACTONE) 25 MG tablet TAKE 1/2 TABLET (12.5MG      TOTAL) DAILY 07/11/19  Yes Gollan, Kathlene November, MD  vitamin E 400 UNIT capsule Take 400 Units by mouth daily.   Yes [provider]  warfarin (JANTOVEN) 3 MG tablet TAKE AS DIRECTED BY THE    COUMADIN CLINIC 06/06/19  Yes [name], MD    Allergies as of 11/02/2019 - Review Complete 09/29/2019  Allergen Reaction Noted  . Phytonadione Other (See Comments) 03/25/2018  . Amiodarone Other (See Comments) 01/02/2015  . Ciprofloxacin Rash 09/18/2014  . Clarithromycin Rash 09/18/2014  . Lisinopril Rash 01/02/2015  . Metronidazole Rash 09/18/2014  . Phytonadione [vitamin k1] Other (See Comments) 01/02/2015    Family History  Problem Relation Age of Onset  . CAD Father 29       MI  . Hypertension Brother   . Heart failure Brother        CHF with pacemaker  . Stroke Brother   . Heart failure Sister        CHF  . Stroke Sister   . Diabetes Cousin   . Cancer Maternal Aunt        colon  . Cancer Cousin        breast  . Cancer Cousin        brain tumor  . Basal cell carcinoma Daughter     Social History   Socioeconomic History  . Marital status: Married    Spouse name: Not on file  .  Number of children: Not on file  . Years of education: Not on file  . Highest education level: Not on file  Occupational History  . Not on file  Tobacco Use  . Smoking status: Former Smoker    Packs/day: 0.50    Years: 20.00    Pack years: 10.00    Types: Cigarettes    Quit date: 02/04/1990    Years since quitting: 29.8  .  Smokeless tobacco: Never Used  Substance and Sexual Activity  . Alcohol use: Yes    Alcohol/week: 0.0 standard drinks    Comment: occasional  . Drug use: No  . Sexual activity: Not Currently    Birth control/protection: None  Other Topics Concern  . 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 Difficulty of Paying Living Expenses: Not hard at all  Food Insecurity: No Food Insecurity  . Worried About Charity fundraiser in the Last Year: Never true  . Ran Out of Food in the Last Year: Never true  Transportation Needs: No Transportation Needs  . Lack of Transportation (Medical): No  . Lack of Transportation (Non-Medical): No  Physical Activity: Inactive  . Days of Exercise per Week: 0 days  . Minutes of Exercise per Session: 0 min  Stress: No Stress Concern Present  . Feeling of Stress : Not at all  Social Connections:   . Frequency of Communication with Friends and Family:   . Frequency of Social Gatherings with Friends and Family:   . Attends Religious Services:   . Active Member of Clubs or Organizations:   . Attends Archivist Meetings:   [name] Marital Status:   Intimate Partner Violence: Not At Risk  . Fear of Current or Ex-Partner: No  . Emotionally Abused: No  .  Physically Abused: No  . Sexually Abused: No    Review of Systems: See HPI, otherwise negative ROS  Physical Exam: BP 140/84   Pulse 75   Temp (!) 96.3 F (35.7 C) (Temporal)   Resp 16   Ht 5\' 2"  (1.575 m)   Wt 59 kg   LMP  (LMP Unknown) Comment: menopause age 25  SpO2 100%   BMI 23.78 kg/m  General:   Alert,  pleasant and cooperative in NAD Head:  Normocephalic and atraumatic. Neck:  Supple; no masses or thyromegaly. Lungs:  Clear throughout to auscultation.    Heart:  Regular rate and rhythm. Abdomen:  Soft, nontender and nondistended. Normal bowel sounds, without guarding, and without rebound.   Neurologic:  Alert and  oriented x4;  grossly normal neurologically.  Impression/Plan: Elizabeth Hall is here for an endoscopy and colonoscopy to be performed for epigastric pain and a history of colon polyps  Risks, benefits, limitations, and alternatives regarding  endoscopy and colonoscopy have been reviewed with the patient.  Questions have been answered.  All parties agreeable.   Elizabeth Lame, MD  11/22/2019, 8:35 AM

## 2019-11-22 NOTE — Anesthesia Postprocedure Evaluation (Signed)
Anesthesia Post Note  Patient: Elizabeth Hall  Procedure(s) Performed: COLONOSCOPY WITH PROPOFOL (N/A ) ESOPHAGOGASTRODUODENOSCOPY (EGD) WITH PROPOFOL (N/A )  Patient location during evaluation: Endoscopy Anesthesia Type: General Level of consciousness: awake and alert Pain management: pain level controlled Vital Signs Assessment: post-procedure vital signs reviewed and stable Respiratory status: spontaneous breathing, nonlabored ventilation, respiratory function stable and patient connected to nasal cannula oxygen Cardiovascular status: blood pressure returned to baseline and stable Postop Assessment: no apparent nausea or vomiting Anesthetic complications: no     Last Vitals:  Vitals:   11/22/19 0950 11/22/19 1000  BP: 113/73 118/79  Pulse: 75 75  Resp: (!) 35 (!) 40  Temp:    SpO2: 97% 100%    Last Pain:  Vitals:   11/22/19 0817  TempSrc: Temporal  PainSc: 0-No pain                 Arita Miss

## 2019-11-22 NOTE — Transfer of Care (Signed)
Immediate Anesthesia Transfer of Care Note  Patient: Elizabeth Hall  Procedure(s) Performed: COLONOSCOPY WITH PROPOFOL (N/A ) ESOPHAGOGASTRODUODENOSCOPY (EGD) WITH PROPOFOL (N/A )  Patient Location: PACU  Anesthesia Type:General  Level of Consciousness: drowsy  Airway & Oxygen Therapy: Patient Spontanous Breathing  Post-op Assessment: Report given to RN and Post -op Vital signs reviewed and stable  Post vital signs: Reviewed and stable  Last Vitals:  Vitals Value Taken Time  BP 105/57 11/22/19 0928  Temp    Pulse 75 11/22/19 0929  Resp 21 11/22/19 0929  SpO2 99 % 11/22/19 0929  Vitals shown include unvalidated device data.  Last Pain:  Vitals:   11/22/19 0817  TempSrc: Temporal  PainSc: 0-No pain         Complications: No apparent anesthesia complications

## 2019-11-22 NOTE — Op Note (Signed)
[facility]astroenter[facility] Patient Name: Elizabeth Hall Procedure Date: 11/22/2019 8:57 AM MRN: [id number] Account #: [id number] Date of Birth: Oct 14, 1940 Admit Type: Outpatient Age: 79 Room: [facility] ENDO ROOM 4 Gender: Female Note Status: Finalized Procedure:             Upper GI endoscopy Indications:           Epigastric abdominal pain Providers:             [name] MD, MD Referring MD:          Ria Bush (Referring MD) Medicines:             Propofol per Anesthesia Complications:         No immediate complications. Procedure:             Pre-Anesthesia Assessment:                        - Prior to the procedure, a History and Physical was                         performed, and patient medications and allergies were                         reviewed. The patient's tolerance of previous                         anesthesia was also reviewed. The risks and benefits                         of the procedure and the sedation options and risks                         were discussed with the patient. All questions were                         answered, and informed consent was obtained. Prior                         Anticoagulants: The patient has taken Coumadin                         (warfarin), last dose was 5 days prior to procedure.                         ASA Grade Assessment: II - A patient with mild                         systemic disease. After reviewing the risks and                         benefits, the patient was deemed in satisfactory                         condition to undergo the procedure.                        After obtaining informed consent, the endoscope was  passed under direct vision. Throughout the procedure,                         the patient's blood pressure, pulse, and oxygen                         saturations were monitored continuously. The Endoscope                         was introduced through the mouth,  and advanced to the                         second part of duodenum. The upper GI endoscopy was                         accomplished without difficulty. The patient tolerated                         the procedure well. Findings:      A small hiatal hernia was present.      Localized minimal inflammation characterized by erythema was found in       the gastric antrum. Biopsies were taken with a cold forceps for       histology.      The examined duodenum was normal. Impression:            - Small hiatal hernia.                        - Gastritis. Biopsied.                        - Normal examined duodenum. Recommendation:        - Discharge patient to home.                        - Resume previous diet.                        - Continue present medications.                        - Await pathology results.                        - Perform a colonoscopy today. Procedure Code(s):     --- Professional ---                        [phone], Esophagogastroduodenoscopy, flexible,                         transoral; with biopsy, single or multiple Diagnosis Code(s):     --- Professional ---                        R10.13, Epigastric pain                        K29.70, Gastritis, unspecified, without bleeding CPT copyright 2019 American Medical Association. All rights reserved. The codes documented in this report are preliminary and upon coder review may  be revised to meet current compliance  requirements. [name] MD, MD 11/22/2019 9:12:55 AM This report has been signed electronically. Number of Addenda: 0 Note Initiated On: 11/22/2019 8:57 AM Estimated Blood Loss:  Estimated blood loss: none.      [facility]

## 2019-11-23 ENCOUNTER — Encounter: Payer: Self-pay | Admitting: *Deleted

## 2019-11-23 LAB — SURGICAL PATHOLOGY

## 2019-11-24 ENCOUNTER — Encounter: Payer: Self-pay | Admitting: Gastroenterology

## 2019-11-28 ENCOUNTER — Encounter: Payer: Self-pay | Admitting: Family Medicine

## 2019-11-28 ENCOUNTER — Other Ambulatory Visit: Payer: Self-pay

## 2019-11-28 ENCOUNTER — Ambulatory Visit (INDEPENDENT_AMBULATORY_CARE_PROVIDER_SITE_OTHER): Payer: Medicare Other

## 2019-11-28 DIAGNOSIS — Z5181 Encounter for therapeutic drug level monitoring: Secondary | ICD-10-CM

## 2019-11-28 DIAGNOSIS — Z953 Presence of xenogenic heart valve: Secondary | ICD-10-CM | POA: Diagnosis not present

## 2019-11-28 DIAGNOSIS — I4891 Unspecified atrial fibrillation: Secondary | ICD-10-CM

## 2019-11-28 DIAGNOSIS — I4821 Permanent atrial fibrillation: Secondary | ICD-10-CM

## 2019-11-28 LAB — POCT INR: INR: 2 (ref 2.0–3.0)

## 2019-11-28 NOTE — Patient Instructions (Signed)
-   take 6 mg wafarin today, - take 4.5 mg tomorrow, then - continue warfarin dosage of 3 mg every day except 6 mg on Mondays - Recheck INR in 3 weeks

## 2019-12-02 ENCOUNTER — Other Ambulatory Visit: Payer: Self-pay | Admitting: Internal Medicine

## 2019-12-05 ENCOUNTER — Other Ambulatory Visit: Payer: Self-pay

## 2019-12-05 ENCOUNTER — Ambulatory Visit (INDEPENDENT_AMBULATORY_CARE_PROVIDER_SITE_OTHER): Payer: Medicare Other | Admitting: Family Medicine

## 2019-12-05 ENCOUNTER — Encounter: Payer: Self-pay | Admitting: Family Medicine

## 2019-12-05 VITALS — BP 108/68 | HR 75 | Temp 97.2°F | Ht 62.0 in | Wt 134.9 lb

## 2019-12-05 DIAGNOSIS — F5104 Psychophysiologic insomnia: Secondary | ICD-10-CM

## 2019-12-05 DIAGNOSIS — F4321 Adjustment disorder with depressed mood: Secondary | ICD-10-CM

## 2019-12-05 DIAGNOSIS — Z950 Presence of cardiac pacemaker: Secondary | ICD-10-CM

## 2019-12-05 DIAGNOSIS — G4700 Insomnia, unspecified: Secondary | ICD-10-CM | POA: Insufficient documentation

## 2019-12-05 DIAGNOSIS — I4821 Permanent atrial fibrillation: Secondary | ICD-10-CM | POA: Diagnosis not present

## 2019-12-05 MED ORDER — TRAZODONE HCL 50 MG PO TABS: 25.0000 mg | ORAL_TABLET | Freq: Every evening | ORAL | 3 refills | Status: DC | PRN

## 2019-12-05 NOTE — Progress Notes (Signed)
This visit was conducted in person.  BP 108/68   Pulse 75   Temp (!) 97.2 F (36.2 C) (Temporal)   Ht 5\' 2"  (1.575 m)   Wt 134 lb 14.4 oz (61.2 kg)   LMP  (LMP Unknown) Comment: menopause age 79  SpO2 98%   BMI 24.67 kg/m    CC: sleep trouble Subjective:    Patient ID: Elizabeth Hall, female    DOB: [birth year], 80 y.o.   MRN: [id number]  HPI: Elizabeth Hall is a 79 y.o. female presenting on 12/05/2019 for Sleeping Problem (having trouble falling and staying asleep. Patient's husband passed 16-Nov-2019. )   Husband passed away <2 months ago - hospice house was involved. Quick decline over a few weeks with MM in relapse.   She notes trouble staying asleep - with nocturnal awakenings x4-5/night. This has been going on for 6 wks since husband's passing. She has tried 2 500mg  tylenol + zyrtec nightly to help sleep - initially effective but not any longer. Melatonin was ineffective. Overall falls asleep well.   Bedtime routine reviewed. No daytime napping. Good light therapy during the day, dark calm quit environment at night. Limits caffeine.  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   Family looking to participate in grieving counseling through hospice.   Daughter [name] and SIL are in the area.  Upcoming trip to [facility] with family - will see sister who is coming to visit after this.   She recently had colonoscopy.      Relevant past medical, surgical, family and social history reviewed and updated as indicated. Interim medical history since our last visit reviewed. Allergies and medications reviewed and updated. Outpatient Medications Prior to Visit  Medication Sig Dispense Refill  . acetaminophen (TYLENOL) 500 MG tablet Take 500-1,000 mg by mouth every 6 (six) hours as needed (for headaches/pain.).    [name] aspirin EC 81 MG tablet Take 81 mg by mouth daily.    . B Complex Vitamins (VITAMIN B COMPLEX PO) Take by mouth daily.    . bumetanide (BUMEX) 0.5 MG tablet  TAKE 1 TABLET DAILY. CONTINUE TO MONITOR WEIGHT, IF YOU START GAINING FLUIDS, THEN INCREASE BACK TO 1MG  DAILY 90 tablet 2  . carvedilol (COREG) 12.5 MG tablet TAKE 1 TABLET TWICE DAILY  WITH MEALS 180 tablet 2  . Cetirizine HCl (ZYRTEC PO) Take by mouth as needed.    . Cholecalciferol (VITAMIN D) 50 MCG (2000 UT) CAPS Take 1 capsule (2,000 Units total) by mouth daily. 30 capsule   . ENTRESTO 24-26 MG TAKE 1 TABLET BY MOUTH TWICE A DAY 180 tablet 0  . LUMIGAN 0.01 % SOLN Place 1 drop into both eyes at bedtime.     . Magnesium 250 MG TABS Take 250 mg by mouth daily.    . meclizine (ANTIVERT) 25 MG tablet TAKE 1 TABLET TWICE A DAY  AS NEEDED FOR DIZZINESS 180 tablet 1  . Polyethyl Glycol-Propyl Glycol (LUBRICANT EYE DROPS) 0.4-0.3 % SOLN Place 1 drop into both eyes 3 (three) times daily as needed (for dry eyes.).    [name] spironolactone (ALDACTONE) 25 MG tablet TAKE 1/2 TABLET (12.5MG      TOTAL) DAILY 45 tablet 3  . vitamin E 400 UNIT capsule Take 400 Units by mouth daily.    [name] warfarin (JANTOVEN) 3 MG tablet Take 1 to 2 tablets by mouth daily as directed by the coumadin clinic 110 tablet 0  . diphenhydrAMINE (BENADRYL) 25 MG  tablet Take 25 mg by mouth as needed. (Patient not taking: Reported on 12/05/2019)    . enoxaparin (LOVENOX) 60 MG/0.6ML injection Inject 0.6 mLs (60 mg total) into the skin every 12 (twelve) hours. 12 mL 1   No facility-administered medications prior to visit.     Per HPI unless specifically indicated in ROS section below Review of Systems Objective:  BP 108/68   Pulse 75   Temp (!) 97.2 F (36.2 C) (Temporal)   Ht 5\' 2"  (1.575 m)   Wt 134 lb 14.4 oz (61.2 kg)   LMP  (LMP Unknown) Comment: menopause age 88  SpO2 98%   BMI 24.67 kg/m   Wt Readings from Last 3 Encounters:  12/05/19 134 lb 14.4 oz (61.2 kg)  11/22/19 130 lb (59 kg)  09/29/19 136 lb 4 oz (61.8 kg)      Physical Exam Vitals and nursing note reviewed.  Constitutional:      Appearance: Normal  appearance. She is not ill-appearing.  Cardiovascular:     Rate and Rhythm: Normal rate and regular rhythm.     Pulses: Normal pulses.     Heart sounds: Normal heart sounds. No murmur heard.   Pulmonary:     Effort: Pulmonary effort is normal. No respiratory distress.     Breath sounds: Normal breath sounds. No wheezing, rhonchi or rales.  Neurological:     Mental Status: She is alert.       Results for orders placed or performed in visit on 11/28/19  POCT INR  Result Value Ref Range   INR 2.0 2.0 - 3.0   Assessment & Plan:  This visit occurred during the SARS-CoV-2 public health emergency.  Safety protocols were in place, including screening questions prior to the visit, additional usage of staff PPE, and extensive cleaning of exam room while observing appropriate contact time as indicated for disinfecting solutions.   Problem List Items Addressed This Visit    Status post biventricular pacemaker   Insomnia - Primary    Grieving after husbands' recent passing. Support provided. Melatonin ineffective. Benadryl ineffective as is zyrtec and tylenol. Will Rx trazodone 25-50mg  nightly, update with effect. Sleep hygiene measures reviewed, handout provided.  She is anticoagulated with warfarin and has pacemaker in place - reviewed possible bleeding and arrhythmia risk with trazodone.       Atrial fibrillation ([place])    Other Visit Diagnoses    Grieving           Meds ordered this encounter  Medications  . traZODone (DESYREL) 50 MG tablet    Sig: Take 0.5-1 tablets (25-50 mg total) by mouth at bedtime as needed for sleep.    Dispense:  30 tablet    Refill:  3   No orders of the defined types were placed in this encounter.   Patient Instructions  Good to see you today.  Ok to try trazodone 25-50mg  nightly for sleep. Let us know how this helps.   Sleep hygiene checklist: 1. Avoid naps during the day 2. Avoid stimulants such as caffeine and nicotine. Avoid bedtime alcohol (it  can speed onset of sleep but the body's metabolism can cause awakenings). 3. All forms of exercise help ensure sound sleep - limit vigorous exercise to morning or late afternoon 4. Avoid food too close to bedtime including chocolate (which contains caffeine) 5. Soak up natural light 6. Establish regular bedtime routine. 7. Associate bed with sleep - avoid TV, computer or phone, reading while in bed. 8.  Ensure pleasant, relaxing sleep environment - quiet, dark, cool room.    Follow up plan: Return if symptoms worsen or fail to improve.  Ria Bush, MD

## 2019-12-05 NOTE — Assessment & Plan Note (Addendum)
Grieving after husbands' recent passing. Support provided. Melatonin ineffective. Benadryl ineffective as is zyrtec and tylenol. Will Rx trazodone 25-50mg  nightly, update with effect. Sleep hygiene measures reviewed, handout provided.  She is anticoagulated with warfarin and has pacemaker in place - reviewed possible bleeding and arrhythmia risk with trazodone.

## 2019-12-05 NOTE — Patient Instructions (Addendum)
Good to see you today.  Ok to try trazodone 25-50mg  nightly for sleep. Let us know how this helps.   Sleep hygiene checklist: 1. Avoid naps during the day 2. Avoid stimulants such as caffeine and nicotine. Avoid bedtime alcohol (it can speed onset of sleep but the body's metabolism can cause awakenings). 3. All forms of exercise help ensure sound sleep - limit vigorous exercise to morning or late afternoon 4. Avoid food too close to bedtime including chocolate (which contains caffeine) 5. Soak up natural light 6. Establish regular bedtime routine. 7. Associate bed with sleep - avoid TV, computer or phone, reading while in bed. 8. Ensure pleasant, relaxing sleep environment - quiet, dark, cool room.

## 2019-12-16 ENCOUNTER — Other Ambulatory Visit: Payer: Self-pay | Admitting: Cardiovascular Disease

## 2019-12-19 ENCOUNTER — Other Ambulatory Visit: Payer: Self-pay

## 2019-12-19 ENCOUNTER — Ambulatory Visit (INDEPENDENT_AMBULATORY_CARE_PROVIDER_SITE_OTHER): Payer: Medicare Other

## 2019-12-19 DIAGNOSIS — Z5181 Encounter for therapeutic drug level monitoring: Secondary | ICD-10-CM | POA: Diagnosis not present

## 2019-12-19 DIAGNOSIS — I4821 Permanent atrial fibrillation: Secondary | ICD-10-CM

## 2019-12-19 DIAGNOSIS — Z953 Presence of xenogenic heart valve: Secondary | ICD-10-CM | POA: Diagnosis not present

## 2019-12-19 DIAGNOSIS — I4891 Unspecified atrial fibrillation: Secondary | ICD-10-CM | POA: Diagnosis not present

## 2019-12-19 LAB — POCT INR: INR: 2.6 (ref 2.0–3.0)

## 2019-12-19 NOTE — Patient Instructions (Signed)
-   continue warfarin dosage of 3 mg every day except 6 mg on Mondays - Recheck INR in 4 weeks 

## 2019-12-30 ENCOUNTER — Other Ambulatory Visit: Payer: Self-pay | Admitting: *Deleted

## 2019-12-30 ENCOUNTER — Telehealth: Payer: Self-pay | Admitting: Cardiovascular Disease

## 2019-12-30 ENCOUNTER — Telehealth: Payer: Self-pay | Admitting: *Deleted

## 2019-12-30 MED ORDER — ENTRESTO 24-26 MG PO TABS: 1.0000 | ORAL_TABLET | Freq: Two times a day (BID) | ORAL | 3 refills | Status: DC

## 2019-12-30 NOTE — Telephone Encounter (Signed)
Provider application/Rx printed has been filled out and signed by [place]. Placed on [place], [place] desk to be faxed once pt returns her application to office.

## 2019-12-30 NOTE — Telephone Encounter (Signed)
Pt came by office today to pick up patient assistance program application.  -Application will need to be faxed to [employer] when returned by pt.

## 2019-12-30 NOTE — Telephone Encounter (Signed)
[employer] drug company faxed over some forms for entresto. Part is for patient to fill out and part for the provider. Patient was told after checking with the nurse, we did not receive said documents. Patient stated she will call them back and have the forms re-faxed. Patient states she will be in the hospital until noon if we receive them, she would like to be called so she can fill her portion out

## 2019-12-30 NOTE — Telephone Encounter (Signed)
-  Left message to notify pt that we have received a [employer] Patient Gaffer for [employer].  Please contact us if she would like to apply for assistance on her Entresto.

## 2020-01-03 ENCOUNTER — Emergency Department
Admission: EM | Admit: 2020-01-03 | Discharge: 2020-01-03 | Disposition: A | Discharge status: Home / Self Care | Payer: Medicare Other | Attending: Student in an Organized Health Care Education/Training Program | Admitting: Student in an Organized Health Care Education/Training Program

## 2020-01-03 ENCOUNTER — Emergency Department: Payer: Medicare Other

## 2020-01-03 ENCOUNTER — Encounter: Payer: Medicare Other | Admitting: Internal Medicine

## 2020-01-03 ENCOUNTER — Other Ambulatory Visit: Payer: Self-pay

## 2020-01-03 ENCOUNTER — Encounter: Payer: Self-pay | Admitting: Emergency Medicine

## 2020-01-03 DIAGNOSIS — M5134 Other intervertebral disc degeneration, thoracic region: Secondary | ICD-10-CM | POA: Diagnosis not present

## 2020-01-03 DIAGNOSIS — Z87891 Personal history of nicotine dependence: Secondary | ICD-10-CM | POA: Insufficient documentation

## 2020-01-03 DIAGNOSIS — X500XXA Overexertion from strenuous movement or load, initial encounter: Secondary | ICD-10-CM | POA: Insufficient documentation

## 2020-01-03 DIAGNOSIS — I509 Heart failure, unspecified: Secondary | ICD-10-CM | POA: Diagnosis not present

## 2020-01-03 DIAGNOSIS — Y999 Unspecified external cause status: Secondary | ICD-10-CM | POA: Diagnosis not present

## 2020-01-03 DIAGNOSIS — M47896 Other spondylosis, lumbar region: Secondary | ICD-10-CM | POA: Diagnosis not present

## 2020-01-03 DIAGNOSIS — N183 Chronic kidney disease, stage 3 unspecified: Secondary | ICD-10-CM | POA: Insufficient documentation

## 2020-01-03 DIAGNOSIS — I130 Hypertensive heart and chronic kidney disease with heart failure and stage 1 through stage 4 chronic kidney disease, or unspecified chronic kidney disease: Secondary | ICD-10-CM | POA: Insufficient documentation

## 2020-01-03 DIAGNOSIS — Z79899 Other long term (current) drug therapy: Secondary | ICD-10-CM | POA: Diagnosis not present

## 2020-01-03 DIAGNOSIS — I700 Atherosclerosis of aorta: Secondary | ICD-10-CM | POA: Diagnosis not present

## 2020-01-03 DIAGNOSIS — Z7982 Long term (current) use of aspirin: Secondary | ICD-10-CM | POA: Insufficient documentation

## 2020-01-03 DIAGNOSIS — M546 Pain in thoracic spine: Secondary | ICD-10-CM | POA: Insufficient documentation

## 2020-01-03 DIAGNOSIS — Y939 Activity, unspecified: Secondary | ICD-10-CM | POA: Insufficient documentation

## 2020-01-03 DIAGNOSIS — S39012A Strain of muscle, fascia and tendon of lower back, initial encounter: Secondary | ICD-10-CM | POA: Diagnosis not present

## 2020-01-03 DIAGNOSIS — M5136 Other intervertebral disc degeneration, lumbar region: Secondary | ICD-10-CM | POA: Diagnosis not present

## 2020-01-03 DIAGNOSIS — S3992XA Unspecified injury of lower back, initial encounter: Secondary | ICD-10-CM | POA: Diagnosis present

## 2020-01-03 DIAGNOSIS — Y929 Unspecified place or not applicable: Secondary | ICD-10-CM | POA: Insufficient documentation

## 2020-01-03 DIAGNOSIS — Z7901 Long term (current) use of anticoagulants: Secondary | ICD-10-CM | POA: Diagnosis not present

## 2020-01-03 MED ORDER — TRAMADOL HCL 50 MG PO TABS
50.0000 mg | ORAL_TABLET | Freq: Once | ORAL | Status: AC
  Administered 2020-01-03: 50 mg via ORAL
  Filled 2020-01-03: qty 1

## 2020-01-03 MED ORDER — DICLOFENAC SODIUM 1 % EX GEL: 2.0000 g | Freq: Four times a day (QID) | CUTANEOUS | 0 refills | Status: DC

## 2020-01-03 MED ORDER — TRAMADOL HCL 50 MG PO TABS: 50.0000 mg | ORAL_TABLET | Freq: Four times a day (QID) | ORAL | 0 refills | Status: DC | PRN

## 2020-01-03 MED ORDER — ORPHENADRINE CITRATE 30 MG/ML IJ SOLN
60.0000 mg | Freq: Once | INTRAMUSCULAR | Status: AC
  Administered 2020-01-03: 60 mg via INTRAMUSCULAR
  Filled 2020-01-03: qty 2

## 2020-01-03 MED ORDER — METHOCARBAMOL 500 MG PO TABS: 500.0000 mg | ORAL_TABLET | Freq: Three times a day (TID) | ORAL | 0 refills | Status: DC | PRN

## 2020-01-03 NOTE — Discharge Instructions (Signed)
Be careful when taking Robaxin and or tramadol.  These medications may make you at high risk for falls and may make you drowsy.  Follow-up with your primary care provider or orthopedist if not improving with medications.  Return to the emergency department for symptoms of change or worsen if you are unable to schedule appointment.

## 2020-01-03 NOTE — ED Triage Notes (Signed)
Pt presents to ED with c/o back pain after picking up a box on Friday. Pt states got up to go to the bathroom at approx 0215 this morning and pain was worse. Pt states after picking up a box she felt a pop and spasms to her back. Pt states has been using extra stength tylenol and icy hot with some relief since Friday.

## 2020-01-03 NOTE — ED Notes (Signed)
See triage note. Presents with lower back pain    States she picked something heavy on Friday  States she had some pain off and on  But pain became worse about 2am  Unable to stand on her own

## 2020-01-03 NOTE — ED Provider Notes (Signed)
[facility] Emergency Department Provider Note ____________________________________________  Time seen: Approximately 9:15 AM  I have reviewed the triage vital signs and the nursing notes.  HISTORY  Chief Complaint Back Pain   HPI Elizabeth Hall is a 79 y.o. female presents to the emergency department for treatment and evaluation of back pain. She was lifting a box of books and developed pain and spasms.  No relief with extra strength Tylenol and IcyHot.  Past Medical History:  Diagnosis Date  . Bleeding of eye   . Chronic systolic heart failure ([place])    a. 11/2016 Echo: EF 35-40%, mild MR w/ nl fxning bioprosthesis, mod dil LA.  . CKD (chronic kidney disease) stage 3, GFR 30-59 ml/min    baseline Cr 1.5-2; prior saw nephrologist thought hypertensive nephropathy/renovascular disease  . CVA (cerebral infarction)    a. 2014 occipital lobe - some vision loss when coumadin held and not bridged  . Diverticulitis 06/2019  . Dyslipidemia   . Glaucoma   . H. pylori infection    treated  . H/O mitral valve replacement    a. 05/2012 s/p 30mm Carpentier Edwards pericardial tissue valve.  [name] Hx of rheumatic fever   . Hypertension   . Melanoma ([place]) 2006   L shoulder  . NICM (nonischemic cardiomyopathy) ([place])    a. 08/2013 s/p Biotronik Ilesto 7 HF1 BiV ICD (ser# [id number]);  b. 11/2016 Echo: EF 35-40%.  . Non-obstructive CAD    a. s/p MI 2001;  b. 12/2016 Cath: LM 20ost, OM2 60, RCA 30p/d, EF 35-45%. Nl R heart filling pressures.  . Permanent atrial fibrillation ([place])    a. 04/2012 s/p AVN & PPM placement (later upgraded to BiV ICD).  [name] Phlebitis   . Stroke ([place])   . 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 06/2012 h/o acute ischemia due to thromboembolism radial and ulnar arteries when coumadin held s/p atherectomy - needs bridging if off coumadin  . Tricuspid regurgitation    a. 05/2012 s/p TV annuloplasty @ time of MVR.    Patient Active  Problem List   Diagnosis Date Noted  . Insomnia 12/05/2019  . Personal history of colonic polyps   . Abdominal pain, epigastric   . Gastritis without bleeding   . Trochanteric bursitis of right hip 12/08/2017  . Exertional dyspnea   . Medicare annual wellness visit, subsequent 07/02/2015  . Advanced care planning/counseling discussion 07/02/2015  . History of melanoma 07/02/2015  . Prediabetes 06/25/2015  . Glaucoma   . History of cerebrovascular accident (CVA) in adulthood   . CKD (chronic kidney disease) stage 3, GFR 30-59 ml/min   . Dyslipidemia   . Hypertension   . Encounter for therapeutic drug monitoring 10/04/2014  . Status post mitral valve replacement with tissue valve 09/20/2014  . Status post biventricular pacemaker 09/20/2014  . Chronic systolic heart failure ([place])   . Atrial fibrillation ([place]) 09/18/2014    Past Surgical History:  Procedure Laterality Date  . ABDOMINAL HYSTERECTOMY    . ABLATION  2013  . ATHERECTOMY  06/2012   left arm after coumadin held without bridging  . BREAST BIOPSY    . CARDIOVASCULAR STRESS TEST  03/2016   WNL  . CATARACT EXTRACTION Bilateral 2007, 2010  . CHOLECYSTECTOMY    . COLONOSCOPY  03/2014   diverticulosis, int hem, rpt 5 yrs for h/o polyps ([place])  . COLONOSCOPY WITH PROPOFOL N/A 11/22/2019   Procedure: COLONOSCOPY WITH PROPOFOL;  Surgeon: [name],  MD;  Location: [facility] ENDOSCOPY;  Service: Endoscopy;  Laterality: N/A;  . defibrillator     MEDTRONIC 5086 MRI 52 CM LEAD, SERIAL # [id number]  . DEXA  07/2016   WNL  . DG ABDOMEN COMPLETE ([place] HX)  02/2013   HH, mod severe erosive gastritis, duodenitis  . ESOPHAGOGASTRODUODENOSCOPY (EGD) WITH PROPOFOL N/A 11/22/2019   mild reactive gastritis, biopsy neg for H pylori [name], [name], MD)  . GALLBLADDER SURGERY  2006  . INSERT / REPLACE / REMOVE PACEMAKER  08/2013  . MITRAL VALVE REPLACEMENT  05/2012  . RIGHT/LEFT HEART CATH AND CORONARY ANGIOGRAPHY N/A 12/29/2016   Procedure:  Right/Left Heart Cath and Coronary Angiography;  Surgeon: [name], MD;  Location: [place] [facility];  Service: Cardiovascular;  Laterality: N/A;  . TOTAL ABDOMINAL HYSTERECTOMY W/ BILATERAL SALPINGOOPHORECTOMY  1991   irregular bleeding  . TRICUSPID VALVE REPLACEMENT  05/2012  . US ECHOCARDIOGRAPHY  02/2014   EF 40%, LA marked dilation, gen LV hypokinesis, mitral prosthetic valve    Prior to Admission medications   Medication Sig Start Date End Date Taking? Authorizing Provider  acetaminophen (TYLENOL) 500 MG tablet Take 500-1,000 mg by mouth every 6 (six) hours as needed (for headaches/pain.).    [provider]  aspirin EC 81 MG tablet Take 81 mg by mouth daily.    [provider]  B Complex Vitamins (VITAMIN B COMPLEX PO) Take by mouth daily.    [provider]  bumetanide (BUMEX) 0.5 MG tablet TAKE 1 TABLET DAILY. CONTINUE TO MONITOR WEIGHT, IF YOU START GAINING FLUIDS, THEN INCREASE BACK TO 1MG  DAILY 05/09/19   [name], MD  carvedilol (COREG) 12.5 MG tablet TAKE 1 TABLET TWICE DAILY  WITH MEALS 10/31/19   [name], MD  Cetirizine HCl (ZYRTEC PO) Take by mouth as needed.    [provider]  Cholecalciferol (VITAMIN D) 50 MCG (2000 UT) CAPS Take 1 capsule (2,000 Units total) by mouth daily. 11/24/18   Ria Bush, MD  diclofenac Sodium (VOLTAREN) 1 % GEL Apply 2 g topically 4 (four) times daily. 01/03/20   Aronda Burford B, FNP  LUMIGAN 0.01 % SOLN Place 1 drop into both eyes at bedtime.  10/23/17   [provider]  Magnesium 250 MG TABS Take 250 mg by mouth daily.    [provider]  meclizine (ANTIVERT) 25 MG tablet TAKE 1 TABLET TWICE A DAY  AS NEEDED FOR DIZZINESS 05/18/19   [name], MD  methocarbamol (ROBAXIN) 500 MG tablet Take 1 tablet (500 mg total) by mouth every 8 (eight) hours as needed for muscle spasms. 01/03/20   Tana Trefry, Dessa Phi, FNP  Polyethyl Glycol-Propyl Glycol (LUBRICANT EYE  DROPS) 0.4-0.3 % SOLN Place 1 drop into both eyes 3 (three) times daily as needed (for dry eyes.).    [provider]  sacubitril-valsartan (ENTRESTO) 24-26 MG Take 1 tablet by mouth 2 (two) times daily. 12/30/19   [name], MD  spironolactone (ALDACTONE) 25 MG tablet TAKE 1/2 TABLET (12.5MG      TOTAL) DAILY 07/11/19   Gollan, Kathlene November, MD  traMADol (ULTRAM) 50 MG tablet Take 1 tablet (50 mg total) by mouth every 6 (six) hours as needed. 01/03/20   Flower Franko, Johnette Abraham B, FNP  traZODone (DESYREL) 50 MG tablet Take 0.5-1 tablets (25-50 mg total) by mouth at bedtime as needed for sleep. 12/05/19   Ria Bush, MD  vitamin E 400 UNIT capsule Take 400 Units by mouth  daily.    [provider]  warfarin (JANTOVEN) 3 MG tablet Take 1 to 2 tablets by mouth daily as directed by the coumadin clinic 12/02/19   [name], MD    Allergies Phytonadione, Amiodarone, Ciprofloxacin, Clarithromycin, Lisinopril, Metronidazole, and Phytonadione [vitamin k1]  Family History  Problem Relation Age of Onset  . CAD Father 59       MI  . Hypertension Brother   . Heart failure Brother        CHF with pacemaker  . Stroke Brother   . Heart failure Sister        CHF  . Stroke Sister   . Diabetes Cousin   . Cancer Maternal Aunt        colon  . Cancer Cousin        breast  . Cancer Cousin        brain tumor  . Basal cell carcinoma Daughter     Social History Social History   Tobacco Use  . Smoking status: Former Smoker    Packs/day: 0.50    Years: 20.00    Pack years: 10.00    Types: Cigarettes    Quit date: 02/04/1990    Years since quitting: 29.9  . Smokeless tobacco: Never Used  Vaping Use  . Vaping Use: Never used  Substance Use Topics  . Alcohol use: Yes    Alcohol/week: 0.0 standard drinks    Comment: occasional  . Drug use: No    Review of Systems Constitutional: Well appearing. Respiratory: Negative for dyspnea. Cardiovascular: Negative for change in skin  temperature or color. Musculoskeletal:   Negative for chronic steroid use   Negative for trauma in the presence of osteoporosis  Positive for age over 71 and trauma.  Negative for constitutional symptoms, or history of cancer   Negative for pain worse at night. Skin: Negative for rash, lesion, or wound.  Genitourinary: Negative for urinary retention. Rectal: Negative for fecal incontinence or new onset constipation/bowel habit changes. Hematological/Immunilogical: Negative for immunosuppression, IV drug use, or fever Neurological: Negative for burning, tingling, numb, electric, radiating pain in the right or left lower extremity.                        Negative for saddle anesthesia.                        Negative for focal neurologic deficit, progressive or disabling symptoms             Negative for saddle anesthesia. ____________________________________________   PHYSICAL EXAM:  VITAL SIGNS: ED Triage Vitals  Enc Vitals Group     BP 01/03/20 0651 (!) 146/74     Pulse Rate 01/03/20 0651 74     Resp 01/03/20 0651 16     Temp 01/03/20 0651 97.7 F (36.5 C)     Temp Source 01/03/20 0651 Oral     SpO2 01/03/20 0651 99 %     Weight 01/03/20 0703 135 lb 6.4 oz (61.4 kg)     Height 01/03/20 0703 5\' 2"  (1.575 m)     Head Circumference --      Peak Flow --      Pain Score 01/03/20 0703 5     Pain Loc --      Pain Edu? --      Excl. in [place]? --     Constitutional: Alert and oriented. Well appearing and in no  acute distress. Eyes: Conjunctivae are clear without discharge or drainage.  Head: Atraumatic. Neck: Full, active range of motion. Respiratory: Respirations even and unlabored. Musculoskeletal: Decreased ROM of the back and extremities, Strength 5/5 of the lower extremities as tested. Neurologic: Reflexes of the lower extremities are 2+.  Negative straight leg raise on the right or left side. Skin: Atraumatic.  Psychiatric: Behavior and affect are  normal.  ____________________________________________   LABS (all labs ordered are listed, but only abnormal results are displayed)  Labs Reviewed - No data to display ____________________________________________  RADIOLOGY  Edges of the thoracic and lumbar spine are negative for acute findings. ____________________________________________   PROCEDURES  Procedure(s) performed:  Procedures ____________________________________________   INITIAL IMPRESSION / ASSESSMENT AND PLAN / ED COURSE  Elizabeth Hall is a 79 y.o. female who presents to the emergency department after lifting a box of books and developing back pain.  Symptoms been present for the past 2 days.  She states that she is having severe back spasms.  Plan will be to give her an injection of Norflex and tramadol.  And get some images to make sure she does not have a compression fracture or other acute findings.  Images of the thoracic and lumbar spine are negative for acute findings.  She has had some relief with the Norflex and Toradol.  Plan will be to prescribe Robaxin, Voltaren gel, and tramadol.  She was advised that the Robaxin and tramadol may make her dizzy or drowsy and that she should change positions slowly and not to take them within 4 hours of 1 another.   Medications  orphenadrine (NORFLEX) injection 60 mg (60 mg Intramuscular Given 01/03/20 0927)  traMADol (ULTRAM) tablet 50 mg (50 mg Oral Given 01/03/20 1122)    ED Discharge Orders         Ordered    methocarbamol (ROBAXIN) 500 MG tablet  Every 8 hours PRN     Discontinue  Reprint     01/03/20 1144    diclofenac Sodium (VOLTAREN) 1 % GEL  4 times daily     Discontinue  Reprint     01/03/20 1144    traMADol (ULTRAM) 50 MG tablet  Every 6 hours PRN     Discontinue  Reprint     01/03/20 1144           Pertinent labs & imaging results that were available during my care of the patient were reviewed by me and considered in my medical decision  making (see chart for details).  _________________________________________   FINAL CLINICAL IMPRESSION(S) / ED DIAGNOSES  Final diagnoses:  Strain of lumbar region, initial encounter  Acute bilateral thoracic back pain     If controlled substance prescribed during this visit, 12 month history viewed on the [place] prior to issuing an initial prescription for Schedule II or III opiod.   Victorino Dike, FNP 01/05/20 1840    Merlyn Lot, MD 01/06/20 [phone]

## 2020-01-05 NOTE — Telephone Encounter (Signed)
Patient dropped of forms to be completed Placed in nurse box

## 2020-01-10 NOTE — Telephone Encounter (Signed)
PAF for Entresto and the application for a handicapp place card have been placed on Dr. Tyrell Antonio desk to be signed.

## 2020-01-12 ENCOUNTER — Other Ambulatory Visit: Payer: Self-pay | Admitting: Cardiovascular Disease

## 2020-01-16 ENCOUNTER — Other Ambulatory Visit: Payer: Self-pay

## 2020-01-16 ENCOUNTER — Ambulatory Visit (INDEPENDENT_AMBULATORY_CARE_PROVIDER_SITE_OTHER): Payer: Medicare Other

## 2020-01-16 DIAGNOSIS — Z953 Presence of xenogenic heart valve: Secondary | ICD-10-CM

## 2020-01-16 DIAGNOSIS — I4821 Permanent atrial fibrillation: Secondary | ICD-10-CM

## 2020-01-16 DIAGNOSIS — I4891 Unspecified atrial fibrillation: Secondary | ICD-10-CM | POA: Diagnosis not present

## 2020-01-16 DIAGNOSIS — Z5181 Encounter for therapeutic drug level monitoring: Secondary | ICD-10-CM

## 2020-01-16 LAB — POCT INR: INR: 2.4 (ref 2.0–3.0)

## 2020-01-16 NOTE — Patient Instructions (Signed)
-   take extra 1/2 tablet today - continue warfarin dosage of 3 mg every day except 6 mg on Mondays - Recheck INR in 4 weeks

## 2020-01-19 DIAGNOSIS — M545 Low back pain: Secondary | ICD-10-CM | POA: Diagnosis not present

## 2020-01-20 NOTE — Telephone Encounter (Signed)
Patients patients assistance application and handicap place card application have been completed. Called the patient to let her know there were ready.  Lmom. Patient is to call back to let us know if she would like to come to the office to pick up the forms or if she would like them mailed to her home.

## 2020-01-20 NOTE — Telephone Encounter (Signed)
Patient will come to office to pick up today before noon

## 2020-01-20 NOTE — Telephone Encounter (Signed)
Forms have been left at the front desk for the patient to pick up.

## 2020-01-24 NOTE — Telephone Encounter (Signed)
Incoming fax from [employer] Patient assistance.  Patient was approved for assistance for the remainder of the calendar year.   Will place approval letter in file cabinet in drug closet.

## 2020-01-24 NOTE — Telephone Encounter (Signed)
Noted  

## 2020-01-27 DIAGNOSIS — M545 Low back pain: Secondary | ICD-10-CM | POA: Diagnosis not present

## 2020-02-03 DIAGNOSIS — M545 Low back pain: Secondary | ICD-10-CM | POA: Diagnosis not present

## 2020-02-06 ENCOUNTER — Other Ambulatory Visit: Payer: Self-pay

## 2020-02-06 ENCOUNTER — Other Ambulatory Visit: Payer: Self-pay | Admitting: Family Medicine

## 2020-02-06 ENCOUNTER — Other Ambulatory Visit (INDEPENDENT_AMBULATORY_CARE_PROVIDER_SITE_OTHER): Payer: Medicare Other

## 2020-02-06 DIAGNOSIS — N1832 Chronic kidney disease, stage 3b: Secondary | ICD-10-CM | POA: Diagnosis not present

## 2020-02-06 LAB — RENAL FUNCTION PANEL
Albumin: 4.6 g/dL (ref 3.5–5.2)
BUN: 36 mg/dL — ABNORMAL HIGH (ref 6–23)
CO2: 25 mEq/L (ref 19–32)
Calcium: 10 mg/dL (ref 8.4–10.5)
Chloride: 104 mEq/L (ref 96–112)
Creatinine, Ser: 1.2 mg/dL (ref 0.40–1.20)
GFR: 43.49 mL/min — ABNORMAL LOW (ref >60.00)
Glucose, Bld: 95 mg/dL (ref 70–99)
Phosphorus: 3.5 mg/dL (ref 2.3–4.6)
Potassium: 4.3 mEq/L (ref 3.5–5.1)
Sodium: 137 mEq/L (ref 135–145)

## 2020-02-06 LAB — CBC WITH DIFFERENTIAL/PLATELET
Basophils Absolute: 0 10*3/uL (ref 0.0–0.1)
Basophils Relative: 0.7 % (ref 0.0–3.0)
Eosinophils Absolute: 0.2 10*3/uL (ref 0.0–0.7)
Eosinophils Relative: 3.2 % (ref 0.0–5.0)
HCT: 35.6 % — ABNORMAL LOW (ref 36.0–46.0)
Hemoglobin: 11.7 g/dL — ABNORMAL LOW (ref 12.0–15.0)
Lymphocytes Relative: 36.1 % (ref 12.0–46.0)
Lymphs Abs: 2 10*3/uL (ref 0.7–4.0)
MCHC: 32.8 g/dL (ref 30.0–36.0)
MCV: 87.5 fl (ref 78.0–100.0)
Monocytes Absolute: 0.6 10*3/uL (ref 0.1–1.0)
Monocytes Relative: 11.5 % (ref 3.0–12.0)
Neutro Abs: 2.7 10*3/uL (ref 1.4–7.7)
Neutrophils Relative %: 48.5 % (ref 43.0–77.0)
Platelets: 230 10*3/uL (ref 150.0–400.0)
RBC: 4.06 Mil/uL (ref 3.87–5.11)
RDW: 15.7 % — ABNORMAL HIGH (ref 11.5–15.5)
WBC: 5.6 10*3/uL (ref 4.0–10.5)

## 2020-02-07 ENCOUNTER — Ambulatory Visit (INDEPENDENT_AMBULATORY_CARE_PROVIDER_SITE_OTHER): Payer: Medicare Other | Admitting: *Deleted

## 2020-02-07 DIAGNOSIS — I428 Other cardiomyopathies: Secondary | ICD-10-CM

## 2020-02-07 LAB — CUP PACEART REMOTE DEVICE CHECK
Date Time Interrogation Session: 20210817080311
Implantable Lead Implant Date: 20150316
Implantable Lead Implant Date: 20150316
Implantable Lead Location: 753858
Implantable Lead Location: 753860
Implantable Lead Model: 346
Implantable Lead Serial Number: 25130583
Implantable Pulse Generator Implant Date: 20150316
Pulse Gen Model: 383547
Pulse Gen Serial Number: 60765179

## 2020-02-07 LAB — PARATHYROID HORMONE, INTACT (NO CA): PTH: 57 pg/mL (ref 14–64)

## 2020-02-09 NOTE — Progress Notes (Signed)
Remote ICD transmission.   

## 2020-02-10 DIAGNOSIS — M545 Low back pain: Secondary | ICD-10-CM | POA: Diagnosis not present

## 2020-02-13 ENCOUNTER — Other Ambulatory Visit: Payer: Self-pay

## 2020-02-13 ENCOUNTER — Ambulatory Visit (INDEPENDENT_AMBULATORY_CARE_PROVIDER_SITE_OTHER): Payer: Medicare Other

## 2020-02-13 DIAGNOSIS — Z953 Presence of xenogenic heart valve: Secondary | ICD-10-CM | POA: Diagnosis not present

## 2020-02-13 DIAGNOSIS — Z5181 Encounter for therapeutic drug level monitoring: Secondary | ICD-10-CM

## 2020-02-13 DIAGNOSIS — I4821 Permanent atrial fibrillation: Secondary | ICD-10-CM

## 2020-02-13 DIAGNOSIS — I4891 Unspecified atrial fibrillation: Secondary | ICD-10-CM

## 2020-02-13 LAB — POCT INR: INR: 2.6 (ref 2.0–3.0)

## 2020-02-13 NOTE — Patient Instructions (Signed)
-   continue warfarin dosage of 3 mg every day except 6 mg on Mondays - Recheck INR in 4 weeks 

## 2020-02-23 ENCOUNTER — Other Ambulatory Visit: Payer: Self-pay | Admitting: Internal Medicine

## 2020-02-28 ENCOUNTER — Ambulatory Visit (INDEPENDENT_AMBULATORY_CARE_PROVIDER_SITE_OTHER): Payer: Medicare Other | Admitting: Family Medicine

## 2020-02-28 ENCOUNTER — Ambulatory Visit (INDEPENDENT_AMBULATORY_CARE_PROVIDER_SITE_OTHER): Payer: Medicare Other | Admitting: Internal Medicine

## 2020-02-28 ENCOUNTER — Other Ambulatory Visit: Payer: Self-pay

## 2020-02-28 ENCOUNTER — Encounter: Payer: Self-pay | Admitting: Family Medicine

## 2020-02-28 ENCOUNTER — Telehealth: Payer: Self-pay | Admitting: *Deleted

## 2020-02-28 ENCOUNTER — Encounter: Payer: Self-pay | Admitting: Internal Medicine

## 2020-02-28 VITALS — BP 110/68 | HR 76 | Temp 97.9°F | Ht 62.0 in | Wt 139.5 lb

## 2020-02-28 VITALS — BP 100/64 | HR 75 | Ht 62.0 in | Wt 139.6 lb

## 2020-02-28 DIAGNOSIS — I10 Essential (primary) hypertension: Secondary | ICD-10-CM | POA: Diagnosis not present

## 2020-02-28 DIAGNOSIS — I5022 Chronic systolic (congestive) heart failure: Secondary | ICD-10-CM

## 2020-02-28 DIAGNOSIS — I482 Chronic atrial fibrillation, unspecified: Secondary | ICD-10-CM

## 2020-02-28 DIAGNOSIS — R1030 Lower abdominal pain, unspecified: Secondary | ICD-10-CM

## 2020-02-28 DIAGNOSIS — I428 Other cardiomyopathies: Secondary | ICD-10-CM

## 2020-02-28 DIAGNOSIS — Z95810 Presence of automatic (implantable) cardiac defibrillator: Secondary | ICD-10-CM

## 2020-02-28 DIAGNOSIS — Z7901 Long term (current) use of anticoagulants: Secondary | ICD-10-CM

## 2020-02-28 LAB — POC URINALSYSI DIPSTICK (AUTOMATED)
Bilirubin, UA: NEGATIVE
Blood, UA: NEGATIVE
Glucose, UA: NEGATIVE
Ketones, UA: NEGATIVE
Leukocytes, UA: NEGATIVE
Nitrite, UA: NEGATIVE
Protein, UA: NEGATIVE
Spec Grav, UA: 1.01 (ref 1.010–1.025)
Urobilinogen, UA: 0.2 E.U./dL
pH, UA: 6 (ref 5.0–8.0)

## 2020-02-28 LAB — CUP PACEART INCLINIC DEVICE CHECK
Battery Voltage: 2.91 V
Brady Statistic RV Percent Paced: 99 %
Date Time Interrogation Session: 20210907101053
HighPow Impedance: 51 Ohm
Implantable Lead Implant Date: 20150316
Implantable Lead Implant Date: 20150316
Implantable Lead Location: 753858
Implantable Lead Location: 753860
Implantable Lead Model: 346
Implantable Lead Serial Number: 25130583
Implantable Pulse Generator Implant Date: 20150316
Lead Channel Impedance Value: 419 Ohm
Lead Channel Impedance Value: 520 Ohm
Lead Channel Pacing Threshold Amplitude: 0.7 V
Lead Channel Pacing Threshold Amplitude: 1.7 V
Lead Channel Pacing Threshold Pulse Width: 0.4 ms
Lead Channel Pacing Threshold Pulse Width: 0.4 ms
Lead Channel Setting Pacing Amplitude: 2.5 V
Lead Channel Setting Pacing Amplitude: 2.5 V
Lead Channel Setting Pacing Pulse Width: 0.4 ms
Lead Channel Setting Pacing Pulse Width: 0.4 ms
Lead Channel Setting Sensing Sensitivity: 0.8 mV
Lead Channel Setting Sensing Sensitivity: 1.6 mV
Pulse Gen Model: 383547
Pulse Gen Serial Number: 60765179

## 2020-02-28 LAB — COMPREHENSIVE METABOLIC PANEL
ALT: 10 U/L (ref 0–35)
AST: 16 U/L (ref 0–37)
Albumin: 4.6 g/dL (ref 3.5–5.2)
Alkaline Phosphatase: 38 U/L — ABNORMAL LOW (ref 39–117)
BUN: 35 mg/dL — ABNORMAL HIGH (ref 6–23)
CO2: 26 mEq/L (ref 19–32)
Calcium: 10.1 mg/dL (ref 8.4–10.5)
Chloride: 99 mEq/L (ref 96–112)
Creatinine, Ser: 1.12 mg/dL (ref 0.40–1.20)
GFR: 47.08 mL/min — ABNORMAL LOW (ref >60.00)
Glucose, Bld: 93 mg/dL (ref 70–99)
Potassium: 4.3 mEq/L (ref 3.5–5.1)
Sodium: 134 mEq/L — ABNORMAL LOW (ref 135–145)
Total Bilirubin: 0.4 mg/dL (ref 0.2–1.2)
Total Protein: 6.9 g/dL (ref 6.0–8.3)

## 2020-02-28 LAB — CBC WITH DIFFERENTIAL/PLATELET
Basophils Absolute: 0.1 10*3/uL (ref 0.0–0.1)
Basophils Relative: 0.7 % (ref 0.0–3.0)
Eosinophils Absolute: 0.2 10*3/uL (ref 0.0–0.7)
Eosinophils Relative: 2.7 % (ref 0.0–5.0)
HCT: 33.7 % — ABNORMAL LOW (ref 36.0–46.0)
Hemoglobin: 11.2 g/dL — ABNORMAL LOW (ref 12.0–15.0)
Lymphocytes Relative: 30.3 % (ref 12.0–46.0)
Lymphs Abs: 2.1 10*3/uL (ref 0.7–4.0)
MCHC: 33.2 g/dL (ref 30.0–36.0)
MCV: 87.5 fl (ref 78.0–100.0)
Monocytes Absolute: 0.7 10*3/uL (ref 0.1–1.0)
Monocytes Relative: 10.1 % (ref 3.0–12.0)
Neutro Abs: 3.9 10*3/uL (ref 1.4–7.7)
Neutrophils Relative %: 56.2 % (ref 43.0–77.0)
Platelets: 253 10*3/uL (ref 150.0–400.0)
RBC: 3.86 Mil/uL — ABNORMAL LOW (ref 3.87–5.11)
RDW: 15.7 % — ABNORMAL HIGH (ref 11.5–15.5)
WBC: 6.9 10*3/uL (ref 4.0–10.5)

## 2020-02-28 MED ORDER — AMOXICILLIN-POT CLAVULANATE 875-125 MG PO TABS
1.0000 | ORAL_TABLET | Freq: Two times a day (BID) | ORAL | 0 refills | Status: AC
Start: 2020-02-28 — End: 2020-03-09

## 2020-02-28 NOTE — Progress Notes (Signed)
Patient Care Team: Ria Bush, MD as PCP - General (Family Medicine) [name], MD as PCP - Cardiology (Cardiology) [name] [name], MD as Referring Physician (Ophthalmology) [name], MD as Consulting Physician (Obstetrics and Gynecology) [name], MD as Consulting Physician (Cardiology) [name], MD as Consulting Physician (Dermatology)   HPI  Elizabeth Hall is a 79 y.o. 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  Notably, her atrial lead is disconnected   Palpitations >> ZIO Patch -- symptoms associated with ventricular pacing at variable rates including her upper sensor rate.    Mitral valve vegetation and underwent mitral valve replacement with a 29 mm Carpentier Edwards pericardial valve and tricuspid valve annuloplasty in December 2013.    Hx of CVA with holding of anticoagulation; currently INR taret 2.5--3 on warfarin with asa 81;  Hx of visual bleeding    Mild sob, no edema or chest pain, palps or syncope  Her husband died 11-01-22 ( anniversary Dec 03, 2063)  Cat[place]fl[place]   On Anticoagulation;  No bleeding     DATE TEST EF   8/15    Echo  40 %   7/17    Echo   35-40 %   10/17  myoview  54-50% NO ischemia  7/18 LHC 35-40% Non obstructive CAD        Date Cr K  Hgb  1/18 1.3 4.6  12.8  5/19  1.47 4.2   8/21 1.20 4.3 11.7   Records and Results Reviewed   Past Medical History:  Diagnosis Date  . Bleeding of eye   . Chronic systolic heart failure ([place])    a. 11/2016 Echo: EF 35-40%, mild MR w/ nl fxning bioprosthesis, mod dil LA.  . CKD (chronic kidney disease) stage 3, GFR 30-59 ml/min    baseline Cr 1.5-2; prior saw nephrologist thought hypertensive nephropathy/renovascular disease  . CVA (cerebral infarction)    a. 2014 occipital lobe - some vision loss when coumadin held and not  bridged  . Diverticulitis 06/2019  . Dyslipidemia   . Glaucoma   . H. pylori infection    treated  . H/O mitral valve replacement    a. 05/2012 s/p 79mm Carpentier Edwards pericardial tissue valve.  [name] Hx of rheumatic fever   . Hypertension   . Melanoma ([place]) 2006   L shoulder  . NICM (nonischemic cardiomyopathy) ([place])    a. 08/2013 s/p Biotronik Ilesto 7 HF1 BiV ICD (ser# [id number]);  b. 11/2016 Echo: EF 35-40%.  . Non-obstructive CAD    a. s/p MI 2001;  b. 12/2016 Cath: LM 20ost, OM2 60, RCA 30p/d, EF 35-45%. Nl R heart filling pressures.  . Permanent atrial fibrillation ([place])    a. 04/2012 s/p AVN & PPM placement (later upgraded to BiV ICD).  [name] Phlebitis   . Stroke ([place])   . 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 06/2012 h/o acute ischemia due to thromboembolism radial and ulnar arteries when coumadin held s/p atherectomy - needs bridging if off coumadin  . Tricuspid regurgitation    a. 05/2012 s/p TV annuloplasty @ time of MVR.    Past Surgical History:  Procedure Laterality Date  . ABDOMINAL HYSTERECTOMY    . ABLATION  2013  . ATHERECTOMY  06/2012   left arm after coumadin held without bridging  .  BREAST BIOPSY    . CARDIOVASCULAR STRESS TEST  03/2016   WNL  . CATARACT EXTRACTION Bilateral 2007, 2010  . CHOLECYSTECTOMY    . COLONOSCOPY  03/2014   diverticulosis, int hem, rpt 5 yrs for h/o polyps ([place])  . COLONOSCOPY WITH PROPOFOL N/A 11/22/2019   Procedure: COLONOSCOPY WITH PROPOFOL;  Surgeon: [name], MD;  Location[facility] ENDOSCOPY;  Service: Endoscopy;  Laterality: N/A;  . defibrillator     MEDTRONIC 5086 MRI 52 CM LEAD, SERIAL # [id number]  . DEXA  07/2016   WNL  . DG ABDOMEN COMPLETE ([place] [facility])  02/2013   HH, mod severe erosive gastritis, duodenitis  . ESOPHAGOGASTRODUODENOSCOPY (EGD) WITH PROPOFOL N/A 11/22/2019   mild reactive gastritis, biopsy neg for H pylori [name], [name], MD)  . GALLBLADDER SURGERY  2006  . INSERT / REPLACE / REMOVE PACEMAKER   08/2013  . MITRAL VALVE REPLACEMENT  05/2012  . RIGHT/LEFT HEART CATH AND CORONARY ANGIOGRAPHY N/A 12/29/2016   Procedure: Right/Left Heart Cath and Coronary Angiography;  Surgeon: [name], MD;  Location: [facility];  Service: Cardiovascular;  Laterality: N/A;  . TOTAL ABDOMINAL HYSTERECTOMY W/ BILATERAL SALPINGOOPHORECTOMY  1991   irregular bleeding  . TRICUSPID VALVE REPLACEMENT  05/2012  . US ECHOCARDIOGRAPHY  02/2014   EF 40%, LA marked dilation, gen LV hypokinesis, mitral prosthetic valve    Current Meds  Medication Sig  . acetaminophen (TYLENOL) 500 MG tablet Take 500-1,000 mg by mouth every 6 (six) hours as needed (for headaches/pain.).  [name] aspirin EC 81 MG tablet Take 81 mg by mouth daily.  . B Complex Vitamins (VITAMIN B COMPLEX PO) Take by mouth daily.  . bumetanide (BUMEX) 0.5 MG tablet TAKE 1 TABLET DAILY. CONTINUE TO MONITOR WEIGHT, IF YOU START GAINING FLUIDS, THEN INCREASE BACK TO 1MG  DAILY  . carvedilol (COREG) 12.5 MG tablet TAKE 1 TABLET TWICE DAILY  WITH MEALS  . Cetirizine HCl (ZYRTEC PO) Take by mouth as needed.  . Cholecalciferol (VITAMIN D) 50 MCG (2000 UT) CAPS Take 1 capsule (2,000 Units total) by mouth daily.  [name] LUMIGAN 0.01 % SOLN Place 1 drop into both eyes at bedtime.   . Magnesium 250 MG TABS Take 250 mg by mouth daily.  . meclizine (ANTIVERT) 25 MG tablet TAKE 1 TABLET TWICE A DAY  AS NEEDED FOR DIZZINESS  . Polyethyl Glycol-Propyl Glycol (LUBRICANT EYE DROPS) 0.4-0.3 % SOLN Place 1 drop into both eyes 3 (three) times daily as needed (for dry eyes.).  [name] sacubitril-valsartan (ENTRESTO) 24-26 MG Take 1 tablet by mouth 2 (two) times daily.  [name] spironolactone (ALDACTONE) 25 MG tablet TAKE 1/2 TABLET (12.5MG      TOTAL) DAILY  . vitamin E 400 UNIT capsule Take 400 Units by mouth daily.  [name] warfarin (JANTOVEN) 3 MG tablet TAKE 1 TO 2 TABLETS DAILY  AS DIRECTED BY THE COUMADINCLINIC    Allergies  Allergen Reactions  . Phytonadione Other (See  Comments)  . Amiodarone Other (See Comments)    "Irritable"--IV INFUSION   . Ciprofloxacin Rash    Rash while on biaxin, cipro, flagyl (unsure which was inciting agent)  . Clarithromycin Rash    Rash while on biaxin, cipro, flagyl (unsure which was inciting agent)  . Lisinopril Rash  . Metronidazole Rash    Rash while on biaxin, cipro, flagyl (unsure which was inciting agent)  . Phytonadione [Vitamin K1] Other (See Comments)    "extremely faint"--IV INFUSION      Review of  Systems negative except from HPI and PMH  Physical Exam BP 100/64   Pulse 75   Ht 5\' 2"  (1.575 m)   Wt 139 lb 9.6 oz (63.3 kg)   LMP  (LMP Unknown) Comment: menopause age 36  SpO2 95%   BMI 25.53 kg/m  Well developed and well nourished in no acute distress HENT normal Neck supple with JVP-flat Clear Device pocket well healed; without hematoma or erythema.  There is no tethering  Regular rate and rhythm, no murmur Abd-soft with active BS No Clubbing cyanosis   edema Skin-warm and dry A & Oriented  Grossly normal sensory and motor function  ECG afib V pacing with QRS QR lead 1; neg lead V1     ASSESSMENT AND PLAN: NICM  Complete heart block  S/p AV Ablation  ICD - CRT Biotronik  Atrial Fib  Permanent  CVA  Congestive heart failure/chronic-systolic-class 2  High risk medication surveillance-spironolactone  ECG-unusual for CRT  Dyspnea on exertion  Renal insufficiency grade 3  Grief  Euvolemic continue current meds  On Anticoagulation;  No bleeding issues   No palpitations     Current medicines are reviewed at length with the patient today .  The patient does not  have concerns regarding medicines.      Signed, Virl Axe, MD  02/28/2020 9:55 AM     [address] [place] [address] [phone] (office) [phone] (fax)

## 2020-02-28 NOTE — Assessment & Plan Note (Signed)
Will forward today's note to coumadin clinic as fyi.

## 2020-02-28 NOTE — Patient Instructions (Signed)

## 2020-02-28 NOTE — Telephone Encounter (Signed)
Seen today. 

## 2020-02-28 NOTE — Assessment & Plan Note (Addendum)
Suspicious for recurrent diverticulitis given prior episode presented similarly (UTI like symptoms). UA today normal. Will check CBC, CMP, and start 10d augmentin course. Update if not improving with treatment, would consider rpt contrasted CT abd/pelvis. Pt agrees with plan.  Will forward note to coumadin clinic as fyi on starting augmentin course.

## 2020-02-28 NOTE — Telephone Encounter (Signed)
Patient called stating that she thinks that she is having a flare-up with diverticulitis again. Patient stated that she started with symptoms off and on for about two weeks. Patient stated that she has had some lower abdominal pain again and some tenderness. Patient stated that she is also having to urinate more often and that is a symptom she had last time when she had diverticulitis. Patient stated that the pain was worse yesterday. Patient stated that she has had some diarrhea like she had with diverticulitis before. Patient denies cough, fever or any other covid symptoms other than the diarrhea. After speaking to Dr. Danise Mina patient was advised to be at the office today at 12:30 pm  to be seen. Patient was given ER precautions and she verbalized understanding.

## 2020-02-28 NOTE — Patient Instructions (Addendum)
Symptoms again suspicious for diverticulitis - labs today, start augmentin 10 day course. Let us know if not improving with treatment to consider CT scan. Change diet to bland low fiber diet while symptoms improve.  Urinalysis returned normal.

## 2020-02-28 NOTE — Progress Notes (Signed)
This visit was conducted in person.  BP 110/68 (BP Location: Left Arm, Patient Position: Sitting, Cuff Size: Normal)   Pulse 76   Temp 97.9 F (36.6 C) (Temporal)   Ht 5\' 2"  (1.575 m)   Wt 139 lb 8 oz (63.3 kg)   LMP  (LMP Unknown) Comment: menopause age 79  SpO2 97%   BMI 25.51 kg/m    CC: abd pain Subjective:    Patient ID: Elizabeth Hall, female    DOB: 1940-08-02, 79 y.o.   MRN: [id number]  HPI: Margie Urbanowicz is a 79 y.o. female presenting on 02/28/2020 for Abdominal Pain (C/o low abd pain.  Started about 2 wks ago.  Has also had some diarrhea.  Denies nausea/vominting.  H/o diverticulitis. )   Brings reflex hammer from Dr Kerrie Pleasure   2 wk h/o lower abd pain described as twisting pressure associated with loser stools. Progressively worsening urinary urgency without dysuria. No recent diet changes - she had been eating more salads recently.   No fevers/chills, nausea/vomiting, blood in stool or urine.   H/o diverticulitis last 06/2019 - this feels similar.   COLONOSCOPY WITH PROPOFOL 11/22/2019 - diverticulosis, int hem, rpt 5 yrs [name], [name], MD)  She is on chronic coumadin for h/o atrial fibrillation.      Relevant past medical, surgical, family and social history reviewed and updated as indicated. Interim medical history since our last visit reviewed. Allergies and medications reviewed and updated. Outpatient Medications Prior to Visit  Medication Sig Dispense Refill  . acetaminophen (TYLENOL) 500 MG tablet Take 500-1,000 mg by mouth every 6 (six) hours as needed (for headaches/pain.).    [name] aspirin EC 81 MG tablet Take 81 mg by mouth daily.    . B Complex Vitamins (VITAMIN B COMPLEX PO) Take by mouth daily.    . bumetanide (BUMEX) 0.5 MG tablet TAKE 1 TABLET DAILY. CONTINUE TO MONITOR WEIGHT, IF YOU START GAINING FLUIDS, THEN INCREASE BACK TO 1MG  DAILY 90 tablet 0  . carvedilol (COREG) 12.5 MG tablet TAKE 1 TABLET TWICE DAILY  WITH MEALS 180 tablet 2    . Cetirizine HCl (ZYRTEC PO) Take by mouth as needed.    . Cholecalciferol (VITAMIN D) 50 MCG (2000 UT) CAPS Take 1 capsule (2,000 Units total) by mouth daily. 30 capsule   . LUMIGAN 0.01 % SOLN Place 1 drop into both eyes at bedtime.     . Magnesium 250 MG TABS Take 250 mg by mouth daily.    . meclizine (ANTIVERT) 25 MG tablet TAKE 1 TABLET TWICE A DAY  AS NEEDED FOR DIZZINESS 180 tablet 1  . Polyethyl Glycol-Propyl Glycol (LUBRICANT EYE DROPS) 0.4-0.3 % SOLN Place 1 drop into both eyes 3 (three) times daily as needed (for dry eyes.).    [name] sacubitril-valsartan (ENTRESTO) 24-26 MG Take 1 tablet by mouth 2 (two) times daily. 180 tablet 3  . spironolactone (ALDACTONE) 25 MG tablet TAKE 1/2 TABLET (12.5MG      TOTAL) DAILY 45 tablet 3  . vitamin E 400 UNIT capsule Take 400 Units by mouth daily.    [name] warfarin (JANTOVEN) 3 MG tablet TAKE 1 TO 2 TABLETS DAILY  AS DIRECTED BY THE COUMADINCLINIC 120 tablet 1   No facility-administered medications prior to visit.     Per HPI unless specifically indicated in ROS section below Review of Systems Objective:  BP 110/68 (BP Location: Left Arm, Patient Position: Sitting, Cuff Size: Normal)   Pulse 76   Temp 97.9  F (36.6 C) (Temporal)   Ht 5\' 2"  (1.575 m)   Wt 139 lb 8 oz (63.3 kg)   LMP  (LMP Unknown) Comment: menopause age 78  SpO2 97%   BMI 25.51 kg/m   Wt Readings from Last 3 Encounters:  02/28/20 139 lb 8 oz (63.3 kg)  02/28/20 139 lb 9.6 oz (63.3 kg)  01/03/20 135 lb 6.4 oz (61.4 kg)      Physical Exam Vitals and nursing note reviewed.  Constitutional:      Appearance: Normal appearance. She is not ill-appearing.  Abdominal:     General: Abdomen is flat. Bowel sounds are normal. There is no distension.     Palpations: Abdomen is soft. There is no mass.     Tenderness: There is abdominal tenderness (mild-mod) in the suprapubic area and left lower quadrant. There is no right CVA tenderness, left CVA tenderness, guarding or rebound.  Negative signs include Murphy's sign.     Hernia: No hernia is present.  Musculoskeletal:     Right lower leg: No edema.     Left lower leg: No edema.  Skin:    General: Skin is warm and dry.     Findings: No rash.  Neurological:     Mental Status: She is alert.  Psychiatric:        Mood and Affect: Mood normal.        Behavior: Behavior normal.       Results for orders placed or performed in visit on 02/28/20  POCT Urinalysis Dipstick (Automated)  Result Value Ref Range   Color, UA light yellow    Clarity, UA clear    Glucose, UA Negative Negative   Bilirubin, UA negative    Ketones, UA negative    Spec Grav, UA 1.010 1.010 - 1.025   Blood, UA negative    pH, UA 6.0 5.0 - 8.0   Protein, UA Negative Negative   Urobilinogen, UA 0.2 0.2 or 1.0 E.U./dL   Nitrite, UA negative    Leukocytes, UA Negative Negative   Lab Results  Component Value Date   INR 2.6 02/13/2020   INR 2.4 01/16/2020   INR 2.6 12/19/2019    Assessment & Plan:  This visit occurred during the SARS-CoV-2 public health emergency.  Safety protocols were in place, including screening questions prior to the visit, additional usage of staff PPE, and extensive cleaning of exam room while observing appropriate contact time as indicated for disinfecting solutions.   Problem List Items Addressed This Visit    Lower abdominal pain - Primary    Suspicious for recurrent diverticulitis given prior episode presented similarly (UTI like symptoms). UA today normal. Will check CBC, CMP, and start 10d augmentin course. Update if not improving with treatment, would consider rpt contrasted CT abd/pelvis. Pt agrees with plan.  Will forward note to coumadin clinic as fyi on starting augmentin course.       Relevant Orders   Comprehensive metabolic panel   CBC with Differential/Platelet   POCT Urinalysis Dipstick (Automated) (Completed)   Anticoagulant Bordon-term use    Will forward today's note to coumadin clinic as fyi.            Meds ordered this encounter  Medications  . amoxicillin-clavulanate (AUGMENTIN) 875-125 MG tablet    Sig: Take 1 tablet by mouth 2 (two) times daily for 10 days.    Dispense:  20 tablet    Refill:  0   Orders Placed This Encounter  Procedures  .  Comprehensive metabolic panel  . CBC with Differential/Platelet  . POCT Urinalysis Dipstick (Automated)    Patient Instructions  Symptoms again suspicious for diverticulitis - labs today, start augmentin 10 day course. Let us know if not improving with treatment to consider CT scan. Change diet to bland low fiber diet while symptoms improve.  Urinalysis returned normal.    Follow up plan: Return if symptoms worsen or fail to improve.  Ria Bush, MD

## 2020-03-09 DIAGNOSIS — S8002XA Contusion of left knee, initial encounter: Secondary | ICD-10-CM | POA: Diagnosis not present

## 2020-03-11 ENCOUNTER — Other Ambulatory Visit: Payer: Self-pay | Admitting: Cardiovascular Disease

## 2020-03-14 ENCOUNTER — Other Ambulatory Visit: Payer: Self-pay

## 2020-03-14 ENCOUNTER — Ambulatory Visit (INDEPENDENT_AMBULATORY_CARE_PROVIDER_SITE_OTHER): Payer: Medicare Other

## 2020-03-14 DIAGNOSIS — Z5181 Encounter for therapeutic drug level monitoring: Secondary | ICD-10-CM

## 2020-03-14 DIAGNOSIS — I4891 Unspecified atrial fibrillation: Secondary | ICD-10-CM

## 2020-03-14 DIAGNOSIS — I4821 Permanent atrial fibrillation: Secondary | ICD-10-CM

## 2020-03-14 DIAGNOSIS — Z953 Presence of xenogenic heart valve: Secondary | ICD-10-CM

## 2020-03-14 LAB — POCT INR: INR: 2.6 (ref 2.0–3.0)

## 2020-03-14 NOTE — Patient Instructions (Signed)
-   continue warfarin dosage of 3 mg every day except 6 mg on Mondays - Recheck INR in 5 weeks

## 2020-03-19 ENCOUNTER — Telehealth: Payer: Self-pay | Admitting: Cardiovascular Disease

## 2020-03-19 ENCOUNTER — Telehealth: Payer: Self-pay

## 2020-03-19 DIAGNOSIS — Z23 Encounter for immunization: Secondary | ICD-10-CM | POA: Diagnosis not present

## 2020-03-19 MED ORDER — FLUCONAZOLE 150 MG PO TABS: 150.0000 mg | ORAL_TABLET | Freq: Once | ORAL | 0 refills | Status: AC

## 2020-03-19 NOTE — Telephone Encounter (Signed)
Treated 02/28/2020 for presumed diverticulitis with 10d augmentin course. Would treat for yeast infection with diflucan sent to pharmacy. rec OV if not better after this or if any worsening despite treatment.  Please have her let coumadin clinic know we are treating with diflucan x1 - as they may need to modify coumadin dosing on this medicine.

## 2020-03-19 NOTE — Telephone Encounter (Signed)
Pt said last seen 02/28/20; pt finished abx and was better but not cleared & continued with lower abd discomfort;pt said the lower abd discomfort is more in vaginal area with discomfort and itching; no discharge and no fever. Pt said last time had this type problem went to GYN but could not understand how related to diverticulitis. Pt has had vaginal discomfort for few days but last night pt was not able to sleep due to discomfort level of 8 and itching. Now discomfort level is 1 and there is no itching now.pt said the abd discomfort and vaginal itching is always worse at night. No discharge. Pt used monistat 3 last night but has not helped symptoms. Pt is still on bland diet and no diarrhea or mucus in BMs. In 02/28/20 note mentioned possible CT. Pt request cb after Dr Darnell Level reviews note. [facility].

## 2020-03-19 NOTE — Telephone Encounter (Signed)
Patient states she forgot to tell last time she was in here that she was on Augmentin 3 weeks ago, which she has not finighed,  and today she was given Diflucan, one dose. States today she had 6 mg of Warfarin. Please call to discuss.

## 2020-03-19 NOTE — Telephone Encounter (Signed)
Spoke w/ pt.  She reports that she finished course of augmentin for diverticulitis, but she started having sx again, so Dr. Darnell Level gave her one dose of diflucan 150 mg today.  Added pt onto my schedule for Wed, 9/29 for INR check. She is appreciative of the call.

## 2020-03-19 NOTE — Telephone Encounter (Addendum)
Pt notified as instructed and pt voiced understanding and pt will pick up the diflucan and contact Mandy that manages the coumadin at Dr Tyrell Antonio office. Pt is concerned about why this is happening again and pt scheduled in office appt with Dr Darnell Level on 03/21/20. FYI to Dr Darnell Level.

## 2020-03-21 ENCOUNTER — Ambulatory Visit (INDEPENDENT_AMBULATORY_CARE_PROVIDER_SITE_OTHER): Payer: Medicare Other

## 2020-03-21 ENCOUNTER — Ambulatory Visit: Payer: Medicare Other | Admitting: Family Medicine

## 2020-03-21 ENCOUNTER — Other Ambulatory Visit: Payer: Self-pay

## 2020-03-21 DIAGNOSIS — I4821 Permanent atrial fibrillation: Secondary | ICD-10-CM | POA: Diagnosis not present

## 2020-03-21 DIAGNOSIS — Z5181 Encounter for therapeutic drug level monitoring: Secondary | ICD-10-CM | POA: Diagnosis not present

## 2020-03-21 DIAGNOSIS — I4891 Unspecified atrial fibrillation: Secondary | ICD-10-CM

## 2020-03-21 DIAGNOSIS — Z953 Presence of xenogenic heart valve: Secondary | ICD-10-CM

## 2020-03-21 LAB — POCT INR: INR: 3.8 — AB (ref 2.0–3.0)

## 2020-03-21 NOTE — Patient Instructions (Signed)
-   skip warfarin today & have a serving of greens - continue warfarin dosage of 3 mg every day except 6 mg on Mondays - Recheck INR was rescheduled on 10/27 @ 1:30

## 2020-03-22 DIAGNOSIS — Z23 Encounter for immunization: Secondary | ICD-10-CM | POA: Diagnosis not present

## 2020-03-28 ENCOUNTER — Telehealth: Payer: Self-pay

## 2020-03-28 NOTE — Telephone Encounter (Signed)
Patient's call was returned in regards to her inquiry about diverticulitis.  She said she was experiencing lower abdominal pressure, and some pelvic discomfort.  No fever, no chills, no nausea vomiting, or bloody diarrhea reported.  I advised her to contact her gynecologist to schedule an appt, due to have her symptoms evaluated.   Thanks,  [place], [place]

## 2020-04-03 DIAGNOSIS — N762 Acute vulvitis: Secondary | ICD-10-CM | POA: Diagnosis not present

## 2020-04-03 DIAGNOSIS — R102 Pelvic and perineal pain: Secondary | ICD-10-CM | POA: Diagnosis not present

## 2020-04-03 DIAGNOSIS — K5792 Diverticulitis of intestine, part unspecified, without perforation or abscess without bleeding: Secondary | ICD-10-CM | POA: Insufficient documentation

## 2020-04-03 DIAGNOSIS — N952 Postmenopausal atrophic vaginitis: Secondary | ICD-10-CM | POA: Diagnosis not present

## 2020-04-05 ENCOUNTER — Ambulatory Visit (INDEPENDENT_AMBULATORY_CARE_PROVIDER_SITE_OTHER): Payer: Medicare Other | Admitting: Cardiovascular Disease

## 2020-04-05 ENCOUNTER — Other Ambulatory Visit: Payer: Self-pay

## 2020-04-05 ENCOUNTER — Encounter: Payer: Self-pay | Admitting: Cardiovascular Disease

## 2020-04-05 VITALS — BP 140/70 | HR 75 | Ht 63.0 in | Wt 138.5 lb

## 2020-04-05 DIAGNOSIS — I059 Rheumatic mitral valve disease, unspecified: Secondary | ICD-10-CM

## 2020-04-05 DIAGNOSIS — Z79899 Other long term (current) drug therapy: Secondary | ICD-10-CM | POA: Diagnosis not present

## 2020-04-05 DIAGNOSIS — I5022 Chronic systolic (congestive) heart failure: Secondary | ICD-10-CM | POA: Diagnosis not present

## 2020-04-05 DIAGNOSIS — I10 Essential (primary) hypertension: Secondary | ICD-10-CM

## 2020-04-05 DIAGNOSIS — I428 Other cardiomyopathies: Secondary | ICD-10-CM

## 2020-04-05 MED ORDER — SACUBITRIL-VALSARTAN 49-51 MG PO TABS: ORAL_TABLET | ORAL | Status: DC

## 2020-04-05 NOTE — Patient Instructions (Addendum)
Medication Instructions:  - Your physician has recommended you make the following change in your medication:   1) INCREASE entresto to 49/51 mg- take 1 tablet by mouth twice daily  - you may use your current supply of entresto 24/26 mg- take 2 tablets twice daily  - we will call [employer] patient assistance to inquire what needs to be done since the dose of your medication has changed  *If you need a refill on your cardiac medications before your next appointment, please call your pharmacy*   Lab Work: - Your physician recommends that you return for lab work in: 1 week (around 04/12/20)- BMP - come to the [place] entrance at [facility] - 1st desk on the right to check in - Lab hours: Monday- Friday (7:30 am- 5:30 pm)    If you have labs (blood work) drawn today and your tests are completely normal, you will receive your results only by: [name] MyChart Message (if you have MyChart) OR . A paper copy in the mail If you have any lab test that is abnormal or we need to change your treatment, we will call you to review the results.   Testing/Procedures: - Your physician has requested that you have an echocardiogram. Echocardiography is a painless test that uses sound waves to create images of your heart. It provides your doctor with information about the size and shape of your heart and how well your heart's chambers and valves are working. This procedure takes approximately one hour. There are no restrictions for this procedure. There is a possibility that an IV may need to be started during your test to inject an image enhancing agent. This is done to obtain more optimal pictures of your heart. Therefore we ask that you do at least drink some water prior to coming in to hydrate your veins.     Follow-Up: At [facility], you and your health needs are our priority.  As part of our continuing mission to provide you with exceptional heart care, we have created designated Provider Care Teams.   These Care Teams include your primary Cardiologist (physician) and Advanced Practice Providers (APPs -  Physician Assistants and Nurse Practitioners) who all work together to provide you with the care you need, when you need it.  We recommend signing up for the patient portal called "MyChart".  Sign up information is provided on this After Visit Summary.  MyChart is used to connect with patients for Virtual Visits (Telemedicine).  Patients are able to view lab/test results, encounter notes, upcoming appointments, etc.  Non-urgent messages can be sent to your provider as well.   To learn more about what you can do with MyChart, go to [email or web address].    Your next appointment:   6 month(s)  The format for your next appointment:   In Person  Provider:   You may see Kathlyn Sacramento, MD or one of the following Advanced Practice Providers on your designated Care Team:    Murray Hodgkins, NP  Christell Faith, PA-C  Marrianne Mood, PA-C  Cadence Kathlen Mody, [place]    Other Instructions   Echocardiogram An echocardiogram is a procedure that uses painless sound waves (ultrasound) to produce an image of the heart. Images from an echocardiogram can provide important information about:  Signs of coronary artery disease (CAD).  Aneurysm detection. An aneurysm is a weak or damaged part of an artery wall that bulges out from the normal force of blood pumping through the body.  Heart size and shape.  Changes in the size or shape of the heart can be associated with certain conditions, including heart failure, aneurysm, and CAD.  Heart muscle function.  Heart valve function.  Signs of a past heart attack.  Fluid buildup around the heart.  Thickening of the heart muscle.  A tumor or infectious growth around the heart valves. Tell a health care provider about:  Any allergies you have.  All medicines you are taking, including vitamins, herbs, eye drops, creams, and over-the-counter  medicines.  Any blood disorders you have.  Any surgeries you have had.  Any medical conditions you have.  Whether you are pregnant or may be pregnant. What are the risks? Generally, this is a safe procedure. However, problems may occur, including:  Allergic reaction to dye (contrast) that may be used during the procedure. What happens before the procedure? No specific preparation is needed. You may eat and drink normally. What happens during the procedure?   An IV tube may be inserted into one of your veins.  You may receive contrast through this tube. A contrast is an injection that improves the quality of the pictures from your heart.  A gel will be applied to your chest.  A wand-like tool (transducer) will be moved over your chest. The gel will help to transmit the sound waves from the transducer.  The sound waves will harmlessly bounce off of your heart to allow the heart images to be captured in real-time motion. The images will be recorded on a computer. The procedure may vary among health care providers and hospitals. What happens after the procedure?  You may return to your normal, everyday life, including diet, activities, and medicines, unless your health care provider tells you not to do that. Summary  An echocardiogram is a procedure that uses painless sound waves (ultrasound) to produce an image of the heart.  Images from an echocardiogram can provide important information about the size and shape of your heart, heart muscle function, heart valve function, and fluid buildup around your heart.  You do not need to do anything to prepare before this procedure. You may eat and drink normally.  After the echocardiogram is completed, you may return to your normal, everyday life, unless your health care provider tells you not to do that. This information is not intended to replace advice given to you by your health care provider. Make sure you discuss any questions you  have with your health care provider. Document Revised: 09/30/2018 Document Reviewed: 07/12/2016 Elsevier Patient Education  [place].

## 2020-04-05 NOTE — Progress Notes (Signed)
Cardiology Office Note   Date:  04/05/2020   ID:  Chakita Mcgraw, DOB 08/20/1940, MRN [id number]  PCP:  Ria Bush, MD  Cardiologist:   Kathlyn Sacramento, MD   Chief Complaint  Patient presents with  . other    6 month follow up. Meds reviewed by the pt. verbally. Pt. c/o shortness of breath & LE edema.       History of Present Illness: Elizabeth Hall is a 79 y.o. female who presents for  a follow-up visit. 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 Congestive heart failure was diagnosed in 2007.  She has known history of atrial fibrillation. She underwent AV nodal ablation and pacemaker placement in November 2013. 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 Her ejection fraction deteriorated and she underwent explantation of the previous pacemaker and implantation of biventricular ICD in March 2015. She had CVA in the past when her warfarin was held for a procedure. Since then, she has been bridged with low molecular weight heparin.  ABI was normal in 02/2016.  She had worsening symptoms of heart failure in 2018.  Echocardiogram at that time showed an EF of 35 to 40% with normal functioning mitral valve prosthesis with a mean gradient of 4 mmHg and moderately dilated left atrium.  A right and left cardiac catheterization in July of 2018 showed mild nonobstructive coronary artery disease.  Ejection fraction was 40%.  Right heart catheterization showed normal LVEDP, normal cardiac output and no significant pulmonary hypertension.  There was no evidence of left-to-right intra-atrial shunt.  There was mildly increased gradient across the mitral valve prosthesis with a mean gradient of 8 mmHg with a valve area of 1.48.  She reports worsening exertional dyspnea without increased leg edema or  increase in weight.  She takes her medications regularly.  She reports frequent urination at night.   Past Medical History:  Diagnosis Date  . Bleeding of eye   . Chronic systolic heart failure ([place])    a. 11/2016 Echo: EF 35-40%, mild MR w/ nl fxning bioprosthesis, mod dil LA.  . CKD (chronic kidney disease) stage 3, GFR 30-59 ml/min (HCC)    baseline Cr 1.5-2; prior saw nephrologist thought hypertensive nephropathy/renovascular disease  . CVA (cerebral infarction)    a. 2014 occipital lobe - some vision loss when coumadin held and not bridged  . Diverticulitis 06/2019  . Dyslipidemia   . Glaucoma   . H. pylori infection    treated  . H/O mitral valve replacement    a. 05/2012 s/p 38mm Carpentier Edwards pericardial tissue valve.  [name] Hx of rheumatic fever   . Hypertension   . Melanoma ([place]) 2006   L shoulder  . NICM (nonischemic cardiomyopathy) ([place])    a. 08/2013 s/p Biotronik Ilesto 7 HF1 BiV ICD (ser# [id number]);  b. 11/2016 Echo: EF 35-40%.  . Non-obstructive CAD    a. s/p MI 2001;  b. 12/2016 Cath: LM 20ost, OM2 60, RCA 30p/d, EF 35-45%. Nl R heart filling pressures.  . Permanent atrial fibrillation ([place])    a. 04/2012 s/p AVN & PPM placement (later upgraded to BiV ICD).  [name] Phlebitis   . Stroke ([place])   . 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 06/2012 h/o acute ischemia due to thromboembolism radial and ulnar arteries when  coumadin held s/p atherectomy - needs bridging if off coumadin  . Tricuspid regurgitation    a. 05/2012 s/p TV annuloplasty @ time of MVR.    Past Surgical History:  Procedure Laterality Date  . ABDOMINAL HYSTERECTOMY    . ABLATION  2013  . ATHERECTOMY  06/2012   left arm after coumadin held without bridging  . BREAST BIOPSY    . CARDIOVASCULAR STRESS TEST  03/2016   WNL  . CATARACT EXTRACTION Bilateral 2007, 2010  . CHOLECYSTECTOMY    . COLONOSCOPY  03/2014   diverticulosis, int hem, rpt 5 yrs for h/o polyps ([place])  . COLONOSCOPY WITH  PROPOFOL N/A 11/22/2019   diverticulosis, int hem, rpt 5 yrs [name], [name], MD)  . defibrillator     MEDTRONIC 5086 MRI 52 CM LEAD, SERIAL # [id number]  . DEXA  07/2016   WNL  . DG ABDOMEN COMPLETE ([place] [facility])  02/2013   HH, mod severe erosive gastritis, duodenitis  . ESOPHAGOGASTRODUODENOSCOPY (EGD) WITH PROPOFOL N/A 11/22/2019   mild reactive gastritis, biopsy neg for H pylori [name], [name], MD)  . GALLBLADDER SURGERY  2006  . INSERT / REPLACE / REMOVE PACEMAKER  08/2013  . MITRAL VALVE REPLACEMENT  05/2012  . RIGHT/LEFT HEART CATH AND CORONARY ANGIOGRAPHY N/A 12/29/2016   Procedure: Right/Left Heart Cath and Coronary Angiography;  Surgeon: [name], MD;  Location: [place] [facility];  Service: Cardiovascular;  Laterality: N/A;  . TOTAL ABDOMINAL HYSTERECTOMY W/ BILATERAL SALPINGOOPHORECTOMY  1991   irregular bleeding  . TRICUSPID VALVE REPLACEMENT  05/2012  . US ECHOCARDIOGRAPHY  02/2014   EF 40%, LA marked dilation, gen LV hypokinesis, mitral prosthetic valve     Current Outpatient Medications  Medication Sig Dispense Refill  . acetaminophen (TYLENOL) 500 MG tablet Take 500-1,000 mg by mouth every 6 (six) hours as needed (for headaches/pain.).    [name] aspirin EC 81 MG tablet Take 81 mg by mouth daily.    . B Complex Vitamins (VITAMIN B COMPLEX PO) Take by mouth daily.    . bumetanide (BUMEX) 0.5 MG tablet TAKE 1 TABLET DAILY. CONTINUE TO MONITOR WEIGHT, IF YOU START GAINING FLUIDS, THEN INCREASE BACK TO 1MG  DAILY 90 tablet 0  . carvedilol (COREG) 12.5 MG tablet TAKE 1 TABLET TWICE DAILY  WITH MEALS 180 tablet 2  . Cetirizine HCl (ZYRTEC PO) Take by mouth as needed.    . Cholecalciferol (VITAMIN D) 50 MCG (2000 UT) CAPS Take 1 capsule (2,000 Units total) by mouth daily. 30 capsule   . ENTRESTO 24-26 MG TAKE 1 TABLET BY MOUTH TWICE A DAY 180 tablet 0  . LUMIGAN 0.01 % SOLN Place 1 drop into both eyes at bedtime.     . Magnesium 250 MG TABS Take 250 mg by mouth daily.    . meclizine  (ANTIVERT) 25 MG tablet TAKE 1 TABLET TWICE A DAY  AS NEEDED FOR DIZZINESS 180 tablet 1  . Polyethyl Glycol-Propyl Glycol (LUBRICANT EYE DROPS) 0.4-0.3 % SOLN Place 1 drop into both eyes 3 (three) times daily as needed (for dry eyes.).    [name] spironolactone (ALDACTONE) 25 MG tablet TAKE 1/2 TABLET (12.5MG      TOTAL) DAILY 45 tablet 3  . vitamin E 400 UNIT capsule Take 400 Units by mouth daily.    [name] warfarin (JANTOVEN) 3 MG tablet TAKE 1 TO 2 TABLETS DAILY  AS DIRECTED BY THE COUMADINCLINIC 120 tablet 1   No current facility-administered medications for this visit.  Allergies:   Phytonadione, Amiodarone, Ciprofloxacin, Clarithromycin, Lisinopril, Metronidazole, and Phytonadione [vitamin k1]    Social History:  The patient  reports that she quit smoking about 30 years ago. Her smoking use included cigarettes. She has a 10.00 pack-year smoking history. She has never used smokeless tobacco. She reports current alcohol use. She reports that she does not use drugs.   Family History:  The patient's family history includes Basal cell carcinoma in her daughter; CAD (age of onset: 85) in her father; Cancer in her cousin, cousin, and maternal aunt; Diabetes in her cousin; Heart failure in her brother and sister; Hypertension in her brother; Stroke in her brother and sister.    ROS:  Please see the history of present illness.   Otherwise, review of systems are positive for none.   All other systems are reviewed and negative.    PHYSICAL EXAM: VS:  BP 140/70 (BP Location: Left Arm, Patient Position: Sitting, Cuff Size: Normal)   Pulse 75   Ht 5\' 3"  (1.6 m)   Wt 138 lb 8 oz (62.8 kg)   LMP  (LMP Unknown) Comment: menopause age 89  SpO2 98%   BMI 24.53 kg/m  , BMI Body mass index is 24.53 kg/m. GEN: Well nourished, well developed, in no acute distress  HEENT: normal  Neck: no JVD, carotid bruits, or masses Cardiac: RRR; no murmurs, rubs, or gallops,no edema  Respiratory:  clear to auscultation  bilaterally, normal work of breathing GI: soft, nontender, nondistended, + BS MS: no deformity or atrophy  Skin: warm and dry, no rash Neuro:  Strength and sensation are intact Psych: euthymic mood, full affect   EKG:  EKG ordered today. EKG showed atrial fibrillation with biventricular pacing.  Heart rate is 75 bpm.   Recent Labs: 02/28/2020: ALT 10; BUN 35; Creatinine, Ser 1.12; Hemoglobin 11.2; Platelets 253.0; Potassium 4.3; Sodium 134    Lipid Panel    Component Value Date/Time   CHOL 166 08/30/2019 0832   CHOL 192 06/12/2014 0000   TRIG 51.0 08/30/2019 0832   TRIG 70 06/12/2014 0000   HDL 67.60 08/30/2019 0832   CHOLHDL 2 08/30/2019 0832   VLDL 10.2 08/30/2019 0832   LDLCALC 88 08/30/2019 0832   LDLCALC 104 06/12/2014 0000      Wt Readings from Last 3 Encounters:  04/05/20 138 lb 8 oz (62.8 kg)  02/28/20 139 lb 8 oz (63.3 kg)  02/28/20 139 lb 9.6 oz (63.3 kg)         ASSESSMENT AND PLAN:  1.  Chronic systolic heart failure:  Most recent ejection fraction was 35-40%.   She appears to be euvolemic on current dose of Bumex 0.5 mg once daily.  I am going to increase Entresto to 49/51 mg twice daily to see if she can tolerate this dose.  Continue small dose spironolactone.  Check basic metabolic profile in 1 week.  Given recent worsening of dyspnea, I am going to repeat her echocardiogram.    2. Chronic atrial fibrillation: Status post AV nodal ablation. She is on Gane-term anticoagulation with warfarin.  3. Essential hypertension: Blood pressure is well controlled on current medications.  4. Status post mitral valve replacement with bioprosthetic valve: Obtain an echocardiogram.   Disposition:   FU with me in 6 months  Signed,  Kathlyn Sacramento, MD  04/05/2020 1:36 PM    [place]

## 2020-04-06 DIAGNOSIS — M25562 Pain in left knee: Secondary | ICD-10-CM | POA: Diagnosis not present

## 2020-04-09 ENCOUNTER — Telehealth: Payer: Self-pay | Admitting: Cardiovascular Disease

## 2020-04-09 NOTE — Telephone Encounter (Signed)
Patient recently started taking medrol taper .  Please call to discuss need for early visit.

## 2020-04-09 NOTE — Telephone Encounter (Signed)
Returned a call to the pt and she started the medrol dose pack on Saturday and wants to check INR to ensure it does not interact. She is aware the 4mg  medrol dose pack normally does not make the INR increase but made her an appt at noon in [facility].

## 2020-04-11 ENCOUNTER — Other Ambulatory Visit: Payer: Self-pay

## 2020-04-11 ENCOUNTER — Telehealth: Payer: Self-pay | Admitting: Cardiovascular Disease

## 2020-04-11 ENCOUNTER — Ambulatory Visit (INDEPENDENT_AMBULATORY_CARE_PROVIDER_SITE_OTHER): Payer: Medicare Other

## 2020-04-11 DIAGNOSIS — Z5181 Encounter for therapeutic drug level monitoring: Secondary | ICD-10-CM | POA: Diagnosis not present

## 2020-04-11 DIAGNOSIS — I4891 Unspecified atrial fibrillation: Secondary | ICD-10-CM

## 2020-04-11 DIAGNOSIS — Z953 Presence of xenogenic heart valve: Secondary | ICD-10-CM

## 2020-04-11 DIAGNOSIS — I4821 Permanent atrial fibrillation: Secondary | ICD-10-CM

## 2020-04-11 LAB — POCT INR: INR: 5.8 — AB (ref 2.0–3.0)

## 2020-04-11 MED ORDER — SACUBITRIL-VALSARTAN 49-51 MG PO TABS
ORAL_TABLET | ORAL | 3 refills | Status: DC
Start: 2020-04-11 — End: 2021-03-20

## 2020-04-11 NOTE — Telephone Encounter (Signed)
The patient was seen in clinic on Thursday 04/05/20 with Dr. Fletcher Anon. At that time, he increased the patient's Entresto dose up to 49/51 mg BID.  The patient advised that she is receiving her Entresto 24/26 mg BID through [employer]. I advised her I would need to call them and find out what we need to do since her dose is being increased.   The patient was made aware we would call her when I knew what needed to be done.  I was able to speak with [employer] today and made them aware of the dose increase for the patient's entresto. They advised we will just need to send in an updated RX for the new dosage and they will reach out to the patient about shipping this out.   RX has been printed and placed on Dr. Tyrell Antonio desk to sign when he returns to the office.   Will reach out to the patient once this is sent in.   [employer] fax #:  [phone]

## 2020-04-11 NOTE — Patient Instructions (Signed)
-   HAVE A LARGE SERVING OF [place] TODAY -SKIP WARFARIN TOMORROW, FRI & SAT - ON Sunday, RESUME warfarin dosage of 3 mg every day except 6 mg on Mondays - Recheck INR as scheduled on 10/27

## 2020-04-12 ENCOUNTER — Other Ambulatory Visit
Admission: RE | Admit: 2020-04-12 | Discharge: 2020-04-12 | Disposition: A | Discharge status: Home / Self Care | Payer: Medicare Other | Attending: Cardiovascular Disease | Admitting: Cardiovascular Disease

## 2020-04-12 DIAGNOSIS — I5022 Chronic systolic (congestive) heart failure: Secondary | ICD-10-CM

## 2020-04-12 DIAGNOSIS — Z79899 Other long term (current) drug therapy: Secondary | ICD-10-CM | POA: Diagnosis not present

## 2020-04-12 DIAGNOSIS — I428 Other cardiomyopathies: Secondary | ICD-10-CM | POA: Diagnosis not present

## 2020-04-12 LAB — BASIC METABOLIC PANEL
Anion gap: 10 (ref 5–15)
BUN: 50 mg/dL — ABNORMAL HIGH (ref 8–23)
CO2: 22 mmol/L (ref 22–32)
Calcium: 9.6 mg/dL (ref 8.9–10.3)
Chloride: 100 mmol/L (ref 98–111)
Creatinine, Ser: 1.11 mg/dL — ABNORMAL HIGH (ref 0.44–1.00)
GFR, Estimated: 51 mL/min — ABNORMAL LOW (ref >60)
Glucose, Bld: 91 mg/dL (ref 70–99)
Potassium: 4 mmol/L (ref 3.5–5.1)
Sodium: 132 mmol/L — ABNORMAL LOW (ref 135–145)

## 2020-04-16 ENCOUNTER — Other Ambulatory Visit: Payer: Self-pay

## 2020-04-16 ENCOUNTER — Telehealth: Payer: Self-pay

## 2020-04-16 ENCOUNTER — Ambulatory Visit (INDEPENDENT_AMBULATORY_CARE_PROVIDER_SITE_OTHER): Payer: Medicare Other | Admitting: Family Medicine

## 2020-04-16 ENCOUNTER — Encounter: Payer: Self-pay | Admitting: Family Medicine

## 2020-04-16 VITALS — BP 90/60 | HR 79 | Temp 97.9°F | Ht 63.0 in | Wt 136.5 lb

## 2020-04-16 DIAGNOSIS — Z7901 Long term (current) use of anticoagulants: Secondary | ICD-10-CM | POA: Diagnosis not present

## 2020-04-16 DIAGNOSIS — R109 Unspecified abdominal pain: Secondary | ICD-10-CM

## 2020-04-16 DIAGNOSIS — R102 Pelvic and perineal pain: Secondary | ICD-10-CM

## 2020-04-16 DIAGNOSIS — K5792 Diverticulitis of intestine, part unspecified, without perforation or abscess without bleeding: Secondary | ICD-10-CM

## 2020-04-16 DIAGNOSIS — N949 Unspecified condition associated with female genital organs and menstrual cycle: Secondary | ICD-10-CM

## 2020-04-16 DIAGNOSIS — R1030 Lower abdominal pain, unspecified: Secondary | ICD-10-CM | POA: Diagnosis not present

## 2020-04-16 DIAGNOSIS — G8929 Other chronic pain: Secondary | ICD-10-CM | POA: Diagnosis not present

## 2020-04-16 LAB — POC URINALSYSI DIPSTICK (AUTOMATED)
Bilirubin, UA: NEGATIVE
Blood, UA: NEGATIVE
Glucose, UA: NEGATIVE
Ketones, UA: NEGATIVE
Nitrite, UA: NEGATIVE
Protein, UA: NEGATIVE
Spec Grav, UA: 1.015 (ref 1.010–1.025)
Urobilinogen, UA: 0.2 E.U./dL
pH, UA: 6 (ref 5.0–8.0)

## 2020-04-16 MED ORDER — AMOXICILLIN-POT CLAVULANATE 875-125 MG PO TABS: 1.0000 | ORAL_TABLET | Freq: Two times a day (BID) | ORAL | 0 refills | Status: AC

## 2020-04-16 MED ORDER — BUMETANIDE 0.5 MG PO TABS
0.5000 mg | ORAL_TABLET | ORAL | Status: DC
Start: 2020-04-16 — End: 2020-04-23

## 2020-04-16 NOTE — Progress Notes (Signed)
poc

## 2020-04-16 NOTE — Telephone Encounter (Signed)
-----   Message from [name], MD sent at 04/14/2020 10:38 AM EDT ----- Stable creatinine but BUN is higher. Check CBC to make sure there is no bleeding. Change Bumex to every other day instead of daily and monitor weight.

## 2020-04-16 NOTE — Patient Instructions (Signed)
Diverticulitis  Diverticulitis is infection or inflammation of small pouches (diverticula) in the colon that form due to a condition called diverticulosis. Diverticula can trap stool (feces) and bacteria, causing infection and inflammation. Diverticulitis may cause severe stomach pain and diarrhea. It may lead to tissue damage in the colon that causes bleeding. The diverticula may also burst (rupture) and cause infected stool to enter other areas of the abdomen. Complications of diverticulitis can include:  Bleeding.  Severe infection.  Severe pain.  Rupture (perforation) of the colon.  Blockage (obstruction) of the colon. What are the causes? This condition is caused by stool becoming trapped in the diverticula, which allows bacteria to grow in the diverticula. This leads to inflammation and infection. What increases the risk? You are more likely to develop this condition if:  You have diverticulosis. The risk for diverticulosis increases if: ? You are overweight or obese. ? You use tobacco products. ? You do not get enough exercise.  You eat a diet that does not include enough fiber. High-fiber foods include fruits, vegetables, beans, nuts, and whole grains. What are the signs or symptoms? Symptoms of this condition may include:  Pain and tenderness in the abdomen. The pain is normally located on the left side of the abdomen, but it may occur in other areas.  Fever and chills.  Bloating.  Cramping.  Nausea.  Vomiting.  Changes in bowel routines.  Blood in your stool. How is this diagnosed? This condition is diagnosed based on:  Your medical history.  A physical exam.  Tests to make sure there is nothing else causing your condition. These tests may include: ? Blood tests. ? Urine tests. ? Imaging tests of the abdomen, including X-rays, ultrasounds, MRIs, or CT scans. How is this treated? Most cases of this condition are mild and can be treated at home.  Treatment may include:  Taking over-the-counter pain medicines.  Following a clear liquid diet.  Taking antibiotic medicines by mouth.  Rest. More severe cases may need to be treated at a hospital. Treatment may include:  Not eating or drinking.  Taking prescription pain medicine.  Receiving antibiotic medicines through an IV tube.  Receiving fluids and nutrition through an IV tube.  Surgery. When your condition is under control, your health care provider may recommend that you have a colonoscopy. This is an exam to look at the entire large intestine. During the exam, a lubricated, bendable tube is inserted into the anus and then passed into the rectum, colon, and other parts of the large intestine. A colonoscopy can show how severe your diverticula are and whether something else may be causing your symptoms. Follow these instructions at home: Medicines  Take over-the-counter and prescription medicines only as told by your health care provider. These include fiber supplements, probiotics, and stool softeners.  If you were prescribed an antibiotic medicine, take it as told by your health care provider. Do not stop taking the antibiotic even if you start to feel better.  Do not drive or use heavy machinery while taking prescription pain medicine. General instructions   Follow a full liquid diet or another diet as directed by your health care provider. After your symptoms improve, your health care provider may tell you to change your diet. He or she may recommend that you eat a diet that contains at least 25 g (25 grams) of fiber daily. Fiber makes it easier to pass stool. Healthy sources of fiber include: ? Berries. One cup contains 4-8 grams of   fiber. ? Beans or lentils. One half cup contains 5-8 grams of fiber. ? Green vegetables. One cup contains 4 grams of fiber.  Exercise for at least 30 minutes, 3 times each week. You should exercise hard enough to raise your heart rate and  break a sweat.  Keep all follow-up visits as told by your health care provider. This is important. You may need a colonoscopy. Contact a health care provider if:  Your pain does not improve.  You have a hard time drinking or eating food.  Your bowel movements do not return to normal. Get help right away if:  Your pain gets worse.  Your symptoms do not get better with treatment.  Your symptoms suddenly get worse.  You have a fever.  You vomit more than one time.  You have stools that are bloody, black, or tarry. Summary  Diverticulitis is infection or inflammation of small pouches (diverticula) in the colon that form due to a condition called diverticulosis. Diverticula can trap stool (feces) and bacteria, causing infection and inflammation.  You are at higher risk for this condition if you have diverticulosis and you eat a diet that does not include enough fiber.  Most cases of this condition are mild and can be treated at home. More severe cases may need to be treated at a hospital.  When your condition is under control, your health care provider may recommend that you have an exam called a colonoscopy. This exam can show how severe your diverticula are and whether something else may be causing your symptoms. This information is not intended to replace advice given to you by your health care provider. Make sure you discuss any questions you have with your health care provider. Document Revised: 05/22/2017 Document Reviewed: 07/12/2016 Elsevier Patient Education  2020 [employer]  

## 2020-04-16 NOTE — Progress Notes (Signed)
Emmette Katt T. Juniper Cobey, MD, [place]  Primary Care and Sports Medicine [facility] at [facility] [place], [address]  Phone: [phone]  FAX: [phone]  Elizabeth Hall - 79 y.o. female  MRN [id number]  Date of Birth: 1941-06-10  Date: 04/16/2020  PCP: Ria Bush, MD  Referral: Ria Bush, MD  Chief Complaint  Patient presents with  . Abdominal Pain    Lower-hx of diverticulitis    This visit occurred during the SARS-CoV-2 public health emergency.  Safety protocols were in place, including screening questions prior to the visit, additional usage of staff PPE, and extensive cleaning of exam room while observing appropriate contact time as indicated for disinfecting solutions.   Subjective:   Elizabeth Hall is a 79 y.o. very pleasant female patient with Body mass index is 24.18 kg/m. who presents with the following:  Patient presents with some lower abdominal pain.  She does have history of confirmed diverticulosis, most recently seen on colonoscopy and CT of the abdomen and pelvis.  Does have a history of known diverticulitis confirmed on CT.  She feels as if this is the same as previously.  She does have pain in the hypogastric and left lower quadrant regions.  She has had multiple work-ups with multiple subspecialists including a number of visits with gynecology, primary care, colonoscopy, EGD, and no one has really given her definitive diagnosis of what is going on.  She also has pain in the perineum.  She has been treated multiple times for UTIs, and she is also been treated for use by her gynecologist.  Review of Systems is noted in the HPI, as appropriate  Objective:   BP 90/60   Pulse 79   Temp 97.9 F (36.6 C) (Temporal)   Ht 5\' 3"  (1.6 m)   Wt 136 lb 8 oz (61.9 kg)   LMP  (LMP Unknown) Comment: menopause age 3  SpO2 99%   BMI 24.18 kg/m   GEN: No acute distress;  alert,appropriate. PULM: Breathing comfortably in no respiratory distress PSYCH: Normally interactive.  She is somewhat tearful when we talk about her husband's recent death.  ABD: S, she is tender in the hypogastric region, lower abdominal region, but this does improve somewhat with distraction., ND, + BS, No rebound, No HSM   Laboratory and Imaging Data: Results for orders placed or performed in visit on 04/16/20  POCT Urinalysis Dipstick (Automated)  Result Value Ref Range   Color, UA Yellow    Clarity, UA Hazy    Glucose, UA Negative Negative   Bilirubin, UA Negative    Ketones, UA Negative    Spec Grav, UA 1.015 1.010 - 1.025   Blood, UA Negative    pH, UA 6.0 5.0 - 8.0   Protein, UA Negative Negative   Urobilinogen, UA 0.2 0.2 or 1.0 E.U./dL   Nitrite, UA Negative    Leukocytes, UA Moderate (2+) (A) Negative     Assessment and Plan:     ICD-10-CM   1. Acute diverticulitis of intestine  K57.92   2. Lower abdominal pain  R10.30 POCT Urinalysis Dipstick (Automated)    Urine Culture  3. Chronic abdominal pain  R10.9 Ambulatory referral to Gastroenterology   G89.29   4. Perineal pain in female  N94.9   5. Chronic anticoagulation  Z79.01    Multiple etiologies are possible.  For now, most likely diagnosis would be diverticulitis, so treat as such.  Also reviewed basic  diet to prevent diverticulosis primarily including high doses of fiber and plenty of water.  I recommended that she do a fiber supplement.  She does have some chronic ongoing abdominal pain and perineal pain that is thus far had negative work-ups.  This does appear to be a challenge.  She is seen multiple specialist.  I do think it is probably a good idea for her to see gastroenterology to see have they have any other thoughts, and she has not seen them face-to-face.  Meds ordered this encounter  Medications  . amoxicillin-clavulanate (AUGMENTIN) 875-125 MG tablet    Sig: Take 1 tablet by mouth 2 (two) times  daily for 10 days.    Dispense:  20 tablet    Refill:  0   There are no discontinued medications. Orders Placed This Encounter  Procedures  . Urine Culture  . Ambulatory referral to Gastroenterology  . POCT Urinalysis Dipstick (Automated)    Follow-up: No follow-ups on file.  Signed,  Maud Deed. Julita Ozbun, MD   Outpatient Encounter Medications as of 04/16/2020  Medication Sig  . acetaminophen (TYLENOL) 500 MG tablet Take 500-1,000 mg by mouth every 6 (six) hours as needed (for headaches/pain.).  [name] aspirin EC 81 MG tablet Take 81 mg by mouth daily.  . B Complex Vitamins (VITAMIN B COMPLEX PO) Take by mouth daily.  . bumetanide (BUMEX) 0.5 MG tablet Take 1 tablet (0.5 mg total) by mouth every other day.  . carvedilol (COREG) 12.5 MG tablet TAKE 1 TABLET TWICE DAILY  WITH MEALS  . Cetirizine HCl (ZYRTEC PO) Take by mouth as needed.  . Cholecalciferol (VITAMIN D) 50 MCG (2000 UT) CAPS Take 1 capsule (2,000 Units total) by mouth daily.  [name] LUMIGAN 0.01 % SOLN Place 1 drop into both eyes at bedtime.   . Magnesium 250 MG TABS Take 250 mg by mouth daily.  . meclizine (ANTIVERT) 25 MG tablet TAKE 1 TABLET TWICE A DAY  AS NEEDED FOR DIZZINESS  . Polyethyl Glycol-Propyl Glycol (LUBRICANT EYE DROPS) 0.4-0.3 % SOLN Place 1 drop into both eyes 3 (three) times daily as needed (for dry eyes.).  [name] sacubitril-valsartan (ENTRESTO) 49-51 MG Take 1 tablet (49/51 mg) by mouth twice daily  . spironolactone (ALDACTONE) 25 MG tablet TAKE 1/2 TABLET (12.5MG      TOTAL) DAILY  . vitamin E 400 UNIT capsule Take 400 Units by mouth daily.  [name] warfarin (JANTOVEN) 3 MG tablet TAKE 1 TO 2 TABLETS DAILY  AS DIRECTED BY THE COUMADINCLINIC  . amoxicillin-clavulanate (AUGMENTIN) 875-125 MG tablet Take 1 tablet by mouth 2 (two) times daily for 10 days.   No facility-administered encounter medications on file as of 04/16/2020.

## 2020-04-16 NOTE — Addendum Note (Signed)
Addended by: Lamar Laundry on: 04/16/2020 09:52 AM   Modules accepted: Orders

## 2020-04-16 NOTE — Telephone Encounter (Signed)
Patient made aare of lab results and Dr. Tyrell Antonio recommendation. Patient will [facility]20 when she come in for her [facility] appt.  Patient verbalized understanding to the instructions given and voiced appreciation for the call.

## 2020-04-17 LAB — URINE CULTURE
MICRO NUMBER:: 11113761
SPECIMEN QUALITY:: ADEQUATE

## 2020-04-18 ENCOUNTER — Other Ambulatory Visit
Admission: RE | Admit: 2020-04-18 | Discharge: 2020-04-18 | Disposition: A | Discharge status: Home / Self Care | Payer: Medicare Other | Attending: Cardiovascular Disease | Admitting: Cardiovascular Disease

## 2020-04-18 ENCOUNTER — Other Ambulatory Visit: Payer: Self-pay

## 2020-04-18 ENCOUNTER — Ambulatory Visit (INDEPENDENT_AMBULATORY_CARE_PROVIDER_SITE_OTHER): Payer: Medicare Other

## 2020-04-18 DIAGNOSIS — I4891 Unspecified atrial fibrillation: Secondary | ICD-10-CM

## 2020-04-18 DIAGNOSIS — I4821 Permanent atrial fibrillation: Secondary | ICD-10-CM | POA: Diagnosis not present

## 2020-04-18 DIAGNOSIS — Z7901 Long term (current) use of anticoagulants: Secondary | ICD-10-CM | POA: Diagnosis not present

## 2020-04-18 DIAGNOSIS — Z5181 Encounter for therapeutic drug level monitoring: Secondary | ICD-10-CM

## 2020-04-18 DIAGNOSIS — Z953 Presence of xenogenic heart valve: Secondary | ICD-10-CM | POA: Diagnosis not present

## 2020-04-18 LAB — CBC WITH DIFFERENTIAL/PLATELET
Abs Immature Granulocytes: 0.02 10*3/uL (ref 0.00–0.07)
Basophils Absolute: 0 10*3/uL (ref 0.0–0.1)
Basophils Relative: 1 %
Eosinophils Absolute: 0.2 10*3/uL (ref 0.0–0.5)
Eosinophils Relative: 3 %
HCT: 33.2 % — ABNORMAL LOW (ref 36.0–46.0)
Hemoglobin: 10.9 g/dL — ABNORMAL LOW (ref 12.0–15.0)
Immature Granulocytes: 0 %
Lymphocytes Relative: 29 %
Lymphs Abs: 2.2 10*3/uL (ref 0.7–4.0)
MCH: 28.6 pg (ref 26.0–34.0)
MCHC: 32.8 g/dL (ref 30.0–36.0)
MCV: 87.1 fL (ref 80.0–100.0)
Monocytes Absolute: 1 10*3/uL (ref 0.1–1.0)
Monocytes Relative: 13 %
Neutro Abs: 4 10*3/uL (ref 1.7–7.7)
Neutrophils Relative %: 54 %
Platelets: 285 10*3/uL (ref 150–400)
RBC: 3.81 MIL/uL — ABNORMAL LOW (ref 3.87–5.11)
RDW: 16.3 % — ABNORMAL HIGH (ref 11.5–15.5)
WBC: 7.4 10*3/uL (ref 4.0–10.5)
nRBC: 0 % (ref 0.0–0.2)

## 2020-04-18 LAB — POCT INR: INR: 2 (ref 2.0–3.0)

## 2020-04-18 NOTE — Telephone Encounter (Addendum)
Signed prescription for the patients increased dosage of Entresto 49/51mg  bid faxed to [employer] patient assistance.  Fax confirmation received.

## 2020-04-18 NOTE — Patient Instructions (Signed)
-   take 2 tablets today, then - resume warfarin dosage of 3 mg every day except 6 mg on Mondays - Recheck INR in 2 weeks

## 2020-04-22 ENCOUNTER — Other Ambulatory Visit: Payer: Self-pay | Admitting: Cardiovascular Disease

## 2020-04-23 ENCOUNTER — Ambulatory Visit: Payer: Medicare Other | Admitting: Family Medicine

## 2020-04-23 NOTE — Telephone Encounter (Signed)
Rx request sent to pharmacy.  

## 2020-04-25 ENCOUNTER — Other Ambulatory Visit: Payer: Self-pay

## 2020-04-25 ENCOUNTER — Ambulatory Visit (INDEPENDENT_AMBULATORY_CARE_PROVIDER_SITE_OTHER): Payer: Medicare Other

## 2020-04-25 DIAGNOSIS — I5022 Chronic systolic (congestive) heart failure: Secondary | ICD-10-CM | POA: Diagnosis not present

## 2020-04-25 DIAGNOSIS — I059 Rheumatic mitral valve disease, unspecified: Secondary | ICD-10-CM

## 2020-04-25 LAB — ECHOCARDIOGRAM COMPLETE
AR max vel: 2.46 cm2
AV Area VTI: 2.43 cm2
AV Area mean vel: 2.41 cm2
AV Mean grad: 3 mmHg
AV Peak grad: 6 mmHg
Ao pk vel: 1.22 m/s
Calc EF: 52.6 %
S' Lateral: 3.3 cm
Single Plane A2C EF: 47.3 %
Single Plane A4C EF: 54.5 %

## 2020-04-30 ENCOUNTER — Other Ambulatory Visit: Payer: Self-pay

## 2020-04-30 ENCOUNTER — Ambulatory Visit (INDEPENDENT_AMBULATORY_CARE_PROVIDER_SITE_OTHER): Payer: Medicare Other

## 2020-04-30 DIAGNOSIS — I4821 Permanent atrial fibrillation: Secondary | ICD-10-CM

## 2020-04-30 DIAGNOSIS — I4891 Unspecified atrial fibrillation: Secondary | ICD-10-CM | POA: Diagnosis not present

## 2020-04-30 DIAGNOSIS — Z953 Presence of xenogenic heart valve: Secondary | ICD-10-CM

## 2020-04-30 DIAGNOSIS — Z5181 Encounter for therapeutic drug level monitoring: Secondary | ICD-10-CM | POA: Diagnosis not present

## 2020-04-30 LAB — POCT INR: INR: 2.7 (ref 2.0–3.0)

## 2020-04-30 NOTE — Patient Instructions (Signed)
-   continue warfarin dosage of 3 mg every day except 6 mg on Mondays - Recheck INR in 3 weeks

## 2020-05-02 ENCOUNTER — Telehealth: Payer: Self-pay

## 2020-05-02 NOTE — Telephone Encounter (Signed)
Patient's call returned. She states that she has been experiencing Lower Abdominal and Perineal Discomfort.  She said the only thing she can contribute this to is Diverticulitis.  She was prescribed Amoxicillin 04/16/20 by Dr. Frederico Hamman for treatment of Diverticulitis. Completed treatment.  She explains the lower abdominal pain as a "shooting pain, comes and goes like a bad toothache"  Her perineal pain is mostly experienced during urination.  Urinalysis was performed by Dr. Frederico Hamman and he informed her that her urine culture showed multiple bacteria.  He also told her Diverticulitis would likely not be the reason for her pain.  Patient has an appt with Dr. [name] December 30th.  Message will be forwarded to [facility] to see if we can evaluate her sooner.  Thanks,  [place], [place]

## 2020-05-02 NOTE — Telephone Encounter (Signed)
-----   Message from Storm Frisk, [place] sent at 05/01/2020 10:53 AM EST ----- Regarding: Patient call Elizabeth Hall,  Mrs. Beringer asked if you could give her a call she has some questions. She can be reached at [phone].    -[place]

## 2020-05-02 NOTE — Telephone Encounter (Signed)
See Michelle's message below. Pt has been taking an antibiotic for a diverticulitis flare. She was given Amoxicillin on 04/16/20 and as soon as she finished the course, she started hurting again. She is not having diarrhea but very soft stools. Last CT was done in January. Are you okay with ordering another CT to confirm if she is in fact having a flare?

## 2020-05-03 NOTE — Telephone Encounter (Signed)
It sounds like from the primary care provider that the symptoms do not present as diverticulitis although the patient had antibiotics for a urinary tract infection with multiple bacteria seen.  If the patient would like to make sure she is not having a bout of diverticulitis and to see if this is a GI issue a CT scan of the abdomen would be the next step.

## 2020-05-04 ENCOUNTER — Other Ambulatory Visit: Payer: Self-pay

## 2020-05-04 DIAGNOSIS — R1084 Generalized abdominal pain: Secondary | ICD-10-CM

## 2020-05-04 NOTE — Telephone Encounter (Signed)
Pt scheduled for CT scan Abdomen/pelvis at [facility] on Wednesday, Nov 17th at 9:15am. Pt has been informed of this appt and prep instructions. Pt will stop by the [facility] registration today, 05/04/20 to pick up contrast.

## 2020-05-07 DIAGNOSIS — H53462 Homonymous bilateral field defects, left side: Secondary | ICD-10-CM | POA: Diagnosis not present

## 2020-05-08 ENCOUNTER — Ambulatory Visit (INDEPENDENT_AMBULATORY_CARE_PROVIDER_SITE_OTHER): Payer: Medicare Other

## 2020-05-08 DIAGNOSIS — M25512 Pain in left shoulder: Secondary | ICD-10-CM | POA: Diagnosis not present

## 2020-05-08 DIAGNOSIS — I428 Other cardiomyopathies: Secondary | ICD-10-CM

## 2020-05-08 LAB — CUP PACEART REMOTE DEVICE CHECK
Date Time Interrogation Session: 20211116074405
Implantable Lead Implant Date: 20150316
Implantable Lead Implant Date: 20150316
Implantable Lead Location: 753858
Implantable Lead Location: 753860
Implantable Lead Model: 346
Implantable Lead Serial Number: 25130583
Implantable Pulse Generator Implant Date: 20150316
Pulse Gen Model: 383547
Pulse Gen Serial Number: 60765179

## 2020-05-09 ENCOUNTER — Ambulatory Visit
Admission: RE | Admit: 2020-05-09 | Discharge: 2020-05-09 | Disposition: A | Discharge status: Home / Self Care | Payer: Medicare Other | Source: Ambulatory Visit | Attending: Gastroenterology | Admitting: Gastroenterology

## 2020-05-09 ENCOUNTER — Ambulatory Visit: Payer: Medicare Other

## 2020-05-09 ENCOUNTER — Other Ambulatory Visit: Payer: Self-pay

## 2020-05-09 DIAGNOSIS — R1084 Generalized abdominal pain: Secondary | ICD-10-CM

## 2020-05-09 DIAGNOSIS — R109 Unspecified abdominal pain: Secondary | ICD-10-CM | POA: Diagnosis not present

## 2020-05-09 MED ORDER — IOHEXOL 300 MG/ML  SOLN
100.0000 mL | Freq: Once | INTRAMUSCULAR | Status: AC | PRN
  Administered 2020-05-09: 100 mL via INTRAVENOUS

## 2020-05-10 ENCOUNTER — Telehealth: Payer: Self-pay

## 2020-05-10 DIAGNOSIS — R1030 Lower abdominal pain, unspecified: Secondary | ICD-10-CM

## 2020-05-10 NOTE — Telephone Encounter (Signed)
Given that this has been going on for >1 year and since she was given UC and ER cautions, I will defer to PCP.

## 2020-05-10 NOTE — Telephone Encounter (Signed)
Pt called [facility] at 4:55 PM due to lower sharp and dull abd pain all the way across abd. Pt has taken amoxicillin 3 different times and it helps while taking med but when finishes med the pain returns. If pt presses abd the abd is tender to touch.this has been going on for over 1 year; pt is to leave 05/14/20 for 1 wk to [place] and pt does not want pain while gone on trip. Please see note 05/09/20 for pt message to GI and CT of abd. Dr [name] note had CT not show diverticulitis and Dr [name] is not sure abxs are answer to abd pain. Pt said she has to have something and wonders if Dr Darnell Level would give different abx. UC & ED precautions given and pt voiced understanding. No available appts at [facility] on 05/11/20. Pt request cb after Dr Darnell Level reviews note.

## 2020-05-10 NOTE — Progress Notes (Signed)
Remote ICD transmission.   

## 2020-05-11 MED ORDER — AMITRIPTYLINE HCL 10 MG PO TABS: ORAL_TABLET | ORAL | 1 refills | Status: DC

## 2020-05-11 NOTE — Telephone Encounter (Signed)
Pt calls for status of review of note by Dr Darnell Level. Pt said she is OK today; pt said she was thinking and the only think that has not been checked is by urology; the pain is more in perineum. GI told pt was not diverticulitis. Pt has seen GYN several times.  Pt has never seen urologist before and if pt needs to see urologist would Dr Darnell Level do referral. If gets referral pt would like to go to [facility] urologist , Dr Hollice Espy. Pt request cb. Pt is still wanting to get a different abx from amoxicillin because when finishes abx the pain comes back and pt is still going out of town early AM on 05/14/20.[employer] [facility]

## 2020-05-11 NOTE — Telephone Encounter (Signed)
Spoke with patient.  CT confirmed sigmoid diverticulitis 06/2019. Treated with augmentin course with improvement but never full resolution. She has had 3 total augmentin courses this year. Latest CT 04/2020 did not show diverticulitis.   She tried ultram without benefit. She takes tylenol 1000mg  and benadryl nightly for sleep.  Limited options given afib sCHF CKD and on coumadin.   UA 03/2020 with 2+ LE, but UCx MBM.  Nocturia x6 per night.   She endorses ongoing perineal/lower abdominal pain.   ?interstitial cystitis. Will trial amitriptyline 10-20mg  nightly, will refer to urology Dr Erlene Quan per pt request.

## 2020-05-22 ENCOUNTER — Ambulatory Visit: Payer: Medicare Other | Admitting: Gastroenterology

## 2020-05-23 ENCOUNTER — Ambulatory Visit (INDEPENDENT_AMBULATORY_CARE_PROVIDER_SITE_OTHER): Payer: Medicare Other

## 2020-05-23 ENCOUNTER — Other Ambulatory Visit: Payer: Self-pay

## 2020-05-23 DIAGNOSIS — Z5181 Encounter for therapeutic drug level monitoring: Secondary | ICD-10-CM

## 2020-05-23 DIAGNOSIS — I4821 Permanent atrial fibrillation: Secondary | ICD-10-CM | POA: Diagnosis not present

## 2020-05-23 DIAGNOSIS — I4891 Unspecified atrial fibrillation: Secondary | ICD-10-CM | POA: Diagnosis not present

## 2020-05-23 DIAGNOSIS — Z953 Presence of xenogenic heart valve: Secondary | ICD-10-CM | POA: Diagnosis not present

## 2020-05-23 LAB — POCT INR: INR: 2.6 (ref 2.0–3.0)

## 2020-05-23 NOTE — Patient Instructions (Signed)
-   continue warfarin dosage of 3 mg every day except 6 mg on Mondays - Recheck INR in 4 weeks

## 2020-06-03 ENCOUNTER — Other Ambulatory Visit: Payer: Self-pay | Admitting: Family Medicine

## 2020-06-04 NOTE — Telephone Encounter (Signed)
90-day rx request from pharmacy.  Ok to send in?

## 2020-06-05 ENCOUNTER — Ambulatory Visit (INDEPENDENT_AMBULATORY_CARE_PROVIDER_SITE_OTHER): Payer: Medicare Other | Admitting: Family

## 2020-06-05 ENCOUNTER — Other Ambulatory Visit: Payer: Self-pay

## 2020-06-05 ENCOUNTER — Encounter: Payer: Self-pay | Admitting: Family

## 2020-06-05 VITALS — BP 104/64 | HR 75 | Ht 63.0 in | Wt 142.0 lb

## 2020-06-05 DIAGNOSIS — Z7901 Long term (current) use of anticoagulants: Secondary | ICD-10-CM

## 2020-06-05 DIAGNOSIS — I5022 Chronic systolic (congestive) heart failure: Secondary | ICD-10-CM

## 2020-06-05 DIAGNOSIS — Z86718 Personal history of other venous thrombosis and embolism: Secondary | ICD-10-CM | POA: Diagnosis not present

## 2020-06-05 DIAGNOSIS — I38 Endocarditis, valve unspecified: Secondary | ICD-10-CM | POA: Diagnosis not present

## 2020-06-05 DIAGNOSIS — R600 Localized edema: Secondary | ICD-10-CM | POA: Diagnosis not present

## 2020-06-05 NOTE — Telephone Encounter (Signed)
ERx 

## 2020-06-05 NOTE — Patient Instructions (Addendum)
Medication Instructions:  Your physician has recommended you make the following change in your medication:   Recommend taking your Bumex 0.5mg  once daily for the next three days.   Then you can return to Bumex 0.5mg  every other day with an additional tablet as needed for weight gain of 2 pounds overnight or 5 pounds in one week.   *If you need a refill on your cardiac medications before your next appointment, please call your pharmacy*  Lab Work: None ordered today.   Testing/Procedures: Your provider has recommended you have a right lower extremity duplex to rule out DVT this week.  Follow-Up: At [facility], you and your health needs are our priority.  As part of our continuing mission to provide you with exceptional heart care, we have created designated Provider Care Teams.  These Care Teams include your primary Cardiologist (physician) and Advanced Practice Providers (APPs -  Physician Assistants and Nurse Practitioners) who all work together to provide you with the care you need, when you need it.  We recommend signing up for the patient portal called "MyChart".  Sign up information is provided on this After Visit Summary.  MyChart is used to connect with patients for Virtual Visits (Telemedicine).  Patients are able to view lab/test results, encounter notes, upcoming appointments, etc.  Non-urgent messages can be sent to your provider as well.   To learn more about what you can do with MyChart, go to [email or web address].    Your next appointment:   In April  *pending the results of your ultrasound this may be moved sooner*  The format for your next appointment:   In Person  Provider:   You may see Kathlyn Sacramento, MD or one of the following Advanced Practice Providers on your designated Care Team:    Murray Hodgkins, NP  Christell Faith, PA-C  Marrianne Mood, PA-C  Cadence Frederick, [place]  Laurann [place], NP

## 2020-06-05 NOTE — Progress Notes (Signed)
Office Visit    Patient Name: Elizabeth Hall Date of Encounter: 06/06/2020  Primary Care Provider:  Ria Bush, MD Primary Cardiologist:  Kathlyn Sacramento, MD Electrophysiologist:  None   Chief Complaint    Elizabeth Hall is a 79 y.o. 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 Bleeding of eye   . Chronic systolic heart failure ([place])    a. 11/2016 Echo: EF 35-40%, mild MR w/ nl fxning bioprosthesis, mod dil LA.  . CKD (chronic kidney disease) stage 3, GFR 30-59 ml/min (HCC)    baseline Cr 1.5-2; prior saw nephrologist thought hypertensive nephropathy/renovascular disease  . CVA (cerebral infarction)    a. 2014 occipital lobe - some vision loss when coumadin held and not bridged  . Diverticulitis 06/2019  . Dyslipidemia   . Glaucoma   . H. pylori infection    treated  . H/O mitral valve replacement    a. 05/2012 s/p 19mm Carpentier Edwards pericardial tissue valve.  [name] Hx of rheumatic fever   . Hypertension   . Melanoma ([place]) 2006   L shoulder  . NICM (nonischemic cardiomyopathy) ([place])    a. 08/2013 s/p Biotronik Ilesto 7 HF1 BiV ICD (ser# [id number]);  b. 11/2016 Echo: EF 35-40%.  . Non-obstructive CAD    a. s/p MI 2001;  b. 12/2016 Cath: LM 20ost, OM2 60, RCA 30p/d, EF 35-45%. Nl R heart filling pressures.  . Permanent atrial fibrillation ([place])    a. 04/2012 s/p AVN & PPM placement (later upgraded to BiV ICD).  [name] Phlebitis   . Stroke ([place])   . Thromboembolism of upper extremity artery ([place])    a. 06/2012 h/o acute ischemia due to  thromboembolism radial and ulnar arteries when coumadin held s/p atherectomy - needs bridging if off coumadin  . Tricuspid regurgitation    a. 05/2012 s/p TV annuloplasty @ time of MVR.   Past Surgical History:  Procedure Laterality Date  . ABDOMINAL HYSTERECTOMY    . ABLATION  2013  . ATHERECTOMY  06/2012   left arm after coumadin held without bridging  . BREAST BIOPSY    . CARDIOVASCULAR STRESS TEST  03/2016   WNL  . CATARACT EXTRACTION Bilateral 2007, 2010  . CHOLECYSTECTOMY    . COLONOSCOPY  03/2014   diverticulosis, int hem, rpt 5 yrs for h/o polyps ([place])  . COLONOSCOPY WITH PROPOFOL N/A 11/22/2019   diverticulosis, int hem, rpt 5 yrs [name], [name], MD)  . defibrillator     MEDTRONIC 5086 MRI 52 CM LEAD, SERIAL # [id number]  . DEXA  07/2016   WNL  . DG ABDOMEN COMPLETE ([place] [facility]X)  02/2013   HH, mod severe erosive gastritis, duodenitis  . ESOPHAGOGASTRODUODENOSCOPY (EGD) WITH PROPOFOL N/A 11/22/2019   mild reactive gastritis, biopsy neg for H pylori [name], [name], MD)  . GALLBLADDER SURGERY  2006  . INSERT / REPLACE / REMOVE PACEMAKER  08/2013  . MITRAL VALVE REPLACEMENT  05/2012  . RIGHT/LEFT HEART CATH AND CORONARY ANGIOGRAPHY N/A 12/29/2016   Procedure: Right/Left Heart Cath and Coronary Angiography;  Surgeon: [name], MD;  Location: [facility]  INVASIVE CV LAB;  Service: Cardiovascular;  Laterality: N/A;  . TOTAL ABDOMINAL HYSTERECTOMY W/ BILATERAL SALPINGOOPHORECTOMY  1991   irregular bleeding  . TRICUSPID VALVE REPLACEMENT  05/2012  . US ECHOCARDIOGRAPHY  02/2014   EF 40%, LA marked dilation, gen LV hypokinesis, mitral prosthetic valve    Allergies  Allergies  Allergen Reactions  . Phytonadione Other (See Comments)  . Amiodarone Other (See Comments)    "Irritable"--IV INFUSION   . Ciprofloxacin Rash    Rash while on biaxin, cipro, flagyl (unsure which was inciting agent)  . Clarithromycin Rash    Rash while on biaxin, cipro, flagyl (unsure which was inciting agent)   . Lisinopril Rash  . Metronidazole Rash    Rash while on biaxin, cipro, flagyl (unsure which was inciting agent)  . Phytonadione [Vitamin K1] Other (See Comments)    "extremely faint"--IV INFUSION    History of Present Illness    Elizabeth Hall is a 79 y.o. 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 Fletcher Anon.  She had normal ABIs 02/2016. Due to worsening symptoms of heart failure in 2018 echo was performed which showed LVEF 35-40%, normal MV prosthesis with mean gradient, moderately dilated left atrium. Right and left heart cath 12/2016 with mild nonobstructive coronary artery disease and EF 40%. RHC with normal LVEDP, normal cardiac output, no significant pulmonary hypertension. No evidence of left-to-right intra-atrial shunt. Mildly increased gradient across MV prosthesis with mean gradient 79mmHg with valve area 1.48.  She was seen in clinic 04/05/20 noting worsening exertional dyspnea and frequent nocturia though no increased edema nor weight change. Her Entresto was increased to 59/51mg  BID. Small dose Spironolactone, Bumex 0.5mg  BID were continued. Repeat echocardiogram 04/25/20 showed EF improved to 45-50% with LV global hypokinesis, mild LVH, diastolic funciton could not be evaluated. Her RV systolic function was mildly reduced though normal size and normal PASP. LA moderately dilated. Mild mitral valve regurgitation with mean gradient 36mmHg. Mild aortic sclerosis without stenosis.   Tells me she noticed swelling and increased pain in her right groin on Sunday.  She notes "a big lump "in her right inner thigh.  Denies missed doses of  warfarin.  Taking her Bumex 0.5 mg every other day.  Reports no color change to the extremity nor change in sensation.  Tells me yesterday it hurt and she had difficulty walking. She works at [employer]. Tells me the pain is just when she is moving today or if she sits wrong.   EKGs/Labs/Other Studies Reviewed:   The following studies were reviewed today:  EKG:  EKG is ordered today.  The ekg ordered today demonstrates v-paced rhythm 75 bpm with biventricular pacemaker.   Recent Labs: 02/28/2020: ALT 10 04/12/2020: BUN 50; Creatinine, Ser 1.11; Potassium 4.0; Sodium 132 04/18/2020: Hemoglobin 10.9; Platelets 285  Recent Lipid Panel    Component Value Date/Time   CHOL 166 08/30/2019 0832   CHOL 192 06/12/2014 0000   TRIG 51.0 08/30/2019 0832   TRIG 70 06/12/2014 0000   HDL 67.60 08/30/2019 0832   CHOLHDL 2 08/30/2019 0832   VLDL 10.2 08/30/2019 0832   LDLCALC 88 08/30/2019 0832   LDLCALC 104 06/12/2014 0000    Home  Medications   Current Meds  Medication Sig  . acetaminophen (TYLENOL) 500 MG tablet Take 500-1,000 mg by mouth every 6 (six) hours as needed (for headaches/pain.).  [name] aspirin EC 81 MG tablet Take 81 mg by mouth daily.  . B Complex Vitamins (VITAMIN B COMPLEX PO) Take by mouth daily.  . bumetanide (BUMEX) 0.5 MG tablet TAKE 1 TABLET DAILY. CONTINUE TO MONITOR WEIGHT, IF YOU START GAINING FLUIDS, THEN INCREASE BACK TO 1MG  DAILY  . carvedilol (COREG) 12.5 MG tablet TAKE 1 TABLET TWICE DAILY  WITH MEALS  . Cetirizine HCl (ZYRTEC PO) Take by mouth as needed.  . Cholecalciferol (VITAMIN D) 50 MCG (2000 UT) CAPS Take 1 capsule (2,000 Units total) by mouth daily.  [name] LUMIGAN 0.01 % SOLN Place 1 drop into both eyes at bedtime.   . Magnesium 250 MG TABS Take 250 mg by mouth daily.  . meclizine (ANTIVERT) 25 MG tablet TAKE 1 TABLET TWICE A DAY  AS NEEDED FOR DIZZINESS  . Polyethyl Glycol-Propyl Glycol 0.4-0.3 % SOLN Place 1 drop into both eyes 3 (three) times daily as needed  (for dry eyes.).  [name] sacubitril-valsartan (ENTRESTO) 49-51 MG Take 1 tablet (49/51 mg) by mouth twice daily  . spironolactone (ALDACTONE) 25 MG tablet TAKE 1/2 TABLET (12.5MG      TOTAL) DAILY  . vitamin E 400 UNIT capsule Take 400 Units by mouth daily.  [name] warfarin (JANTOVEN) 3 MG tablet TAKE 1 TO 2 TABLETS DAILY  AS DIRECTED BY THE COUMADINCLINIC  . [DISCONTINUED] amitriptyline (ELAVIL) 10 MG tablet Start at 10mg  PO nightly, may increase to 20mg  nightly in 2 days if tolerated well     Review of Systems  All other systems reviewed and are otherwise negative except as noted above.  Physical Exam    VS:  BP 104/64   Pulse 75   Ht 5\' 3"  (1.6 m)   Wt 142 lb (64.4 kg)   LMP  (LMP Unknown) Comment: menopause age 68  BMI 25.15 kg/m  , BMI Body mass index is 25.15 kg/m.  Wt Readings from Last 3 Encounters:  06/05/20 142 lb (64.4 kg)  04/16/20 136 lb 8 oz (61.9 kg)  04/05/20 138 lb 8 oz (62.8 kg)    GEN: Well nourished, well developed, in no acute distress. HEENT: normal. Neck: Supple, no JVD, carotid bruits, or masses. Cardiac: RRR, no murmurs, rubs, or gallops. No clubbing, cyanosis.  Radials/DP/PT 2+ and equal bilaterally.  Respiratory:  Respirations regular and unlabored, clear to auscultation bilaterally. GI: Soft, nontender, nondistended. MS: No deformity or atrophy. R hip tender to palpation. Skin: Warm and dry, no rash. R medial thigh with some swelling and tender to palpation.  Neuro:  Strength and sensation are intact. Psych: Normal affect.  Assessment & Plan    1. Right thigh swelling -reports 3-day history of righ groin discomfort, right inner thigh swelling.  Denies missed doses of warfarin.  Reports no injury.  Also notes some right-sided hip discomfort.  Etiology sciatic nerve versus soft tissue injury versus DVT.  She is hemodynamically stable with no tachycardia and no shortness of breath.  Plan for RLE duplex to rule out DVT.  Low suspicion for DVT as no injury, no  recent surgery, no missed doses of warfarin.  She also has swelling localized to the right inner thigh but none to the distal portion of the leg.  2. HFrEF -LVEF 45-50% on 04/28/2020..  Euvolemic and well compensated though in the setting of right thigh swelling,  as above instructed her to take her Bumex 0.5 mg daily for the next 3 days.  She presently takes every other day.  Continue Entresto, Spironolactone, Coreg at present doses.  Unable to further uptitrate due to relative hypotension.  3. Valvular heart disease -s/p mitral valve replacement with bioprosthetic valve.  Echo 04/2020 with bioprosthetic valve functioning well with mean gradient 4 mmHg.  Continue to monitor with periodic echo.  4. Atrial fibrillation on chronic anticoagulation-s/p AV nodal ablation.  Denies bleeding complications on warfarin.  Reports no missed doses.  Follows closely with Coumadin clinic.  Disposition: Follow up in April As previously recommended with Dr. Fletcher Anon or APP.  Lower extremity duplex is notable for DVT earlier follow-up will be scheduled.  Signed, Loel Dubonnet, NP 06/06/2020, 2:14 PM [place]

## 2020-06-06 ENCOUNTER — Encounter: Payer: Self-pay | Admitting: Family

## 2020-06-07 ENCOUNTER — Other Ambulatory Visit: Payer: Self-pay

## 2020-06-07 ENCOUNTER — Ambulatory Visit (INDEPENDENT_AMBULATORY_CARE_PROVIDER_SITE_OTHER): Payer: Medicare Other

## 2020-06-07 DIAGNOSIS — Z86718 Personal history of other venous thrombosis and embolism: Secondary | ICD-10-CM

## 2020-06-07 DIAGNOSIS — R600 Localized edema: Secondary | ICD-10-CM

## 2020-06-07 DIAGNOSIS — Z7901 Long term (current) use of anticoagulants: Secondary | ICD-10-CM | POA: Diagnosis not present

## 2020-06-08 ENCOUNTER — Telehealth: Payer: Self-pay | Admitting: *Deleted

## 2020-06-08 NOTE — Telephone Encounter (Signed)
Spoke to pt. The patient has been notified of the ultrasound result and verbalized understanding.  All questions (if any) were answered.

## 2020-06-08 NOTE — Telephone Encounter (Signed)
Attempted to call pt to notify of ultrasound results. No answer. LMOM TCB.

## 2020-06-08 NOTE — Telephone Encounter (Signed)
-----   Message from Loel Dubonnet, NP sent at 06/08/2020  7:27 AM EST ----- Ultrasound showed no evidence of DVT (blood clot). Great result! Swelling is likely some soft tissue injury. If not improving, recommend appointment with primary care provider.

## 2020-06-11 ENCOUNTER — Encounter: Payer: Self-pay | Admitting: Urology

## 2020-06-11 ENCOUNTER — Ambulatory Visit (INDEPENDENT_AMBULATORY_CARE_PROVIDER_SITE_OTHER): Payer: Medicare Other | Admitting: Urology

## 2020-06-11 ENCOUNTER — Other Ambulatory Visit: Payer: Self-pay

## 2020-06-11 VITALS — BP 134/77 | HR 76

## 2020-06-11 DIAGNOSIS — N3020 Other chronic cystitis without hematuria: Secondary | ICD-10-CM

## 2020-06-11 DIAGNOSIS — R1084 Generalized abdominal pain: Secondary | ICD-10-CM

## 2020-06-11 LAB — URINALYSIS, COMPLETE
Bilirubin, UA: NEGATIVE
Glucose, UA: NEGATIVE
Ketones, UA: NEGATIVE
Nitrite, UA: POSITIVE — AB
Protein,UA: NEGATIVE
Specific Gravity, UA: 1.015 (ref 1.005–1.030)
Urobilinogen, Ur: 0.2 mg/dL (ref 0.2–1.0)
pH, UA: 7 (ref 5.0–7.5)

## 2020-06-11 LAB — MICROSCOPIC EXAMINATION: WBC, UA: >30 /hpf — AB (ref 0–5)

## 2020-06-11 MED ORDER — AMOXICILLIN 500 MG PO CAPS: ORAL_CAPSULE | ORAL | 0 refills | Status: DC

## 2020-06-11 NOTE — Progress Notes (Signed)
06/11/2020 10:27 AM   Elizabeth Hall 1941/04/18 [id number]  Referring provider: Ria Bush, MD [place],   [id number]  No chief complaint on file.   HPI: I was consulted to assess the patient's chronic pelvic pain for more than 1 year.  She gets suprapubic and perineal discomfort.  She has failed 2 treatments for bladder infections as well as vaginal estrogen cream for possible vaginal dryness.  She had a negative CT scan of her kidneys many months ago but did have diverticulitis.  When this was treated it improve things temporarily.  The x-ray actually read diverticulosis but not diverticulitis  She feels the discomfort and sometimes a pressure that comes and goes.  She might feel it more when she sits but is not necessarily worse when she voids.  Is not relieved when she voids.  She voids 2 or 3 times a day.  She voids 4-6 times a night.  She has mild ankle edema.  She takes a diuretic.  She had a bladder suspension many years ago with a hysterectomy  Recently prescribed amitriptyline with some benefit but still has pain  No history of kidney stones or bladder infections   PMH: Past Medical History:  Diagnosis Date  . Bleeding of eye   . Chronic systolic heart failure ([place])    a. 11/2016 Echo: EF 35-40%, mild MR w/ nl fxning bioprosthesis, mod dil LA.  . CKD (chronic kidney disease) stage 3, GFR 30-59 ml/min (HCC)    baseline Cr 1.5-2; prior saw nephrologist thought hypertensive nephropathy/renovascular disease  . CVA (cerebral infarction)    a. 2014 occipital lobe - some vision loss when coumadin held and not bridged  . Diverticulitis 06/2019  . Dyslipidemia   . Glaucoma   . H. pylori infection    treated  . H/O mitral valve replacement    a. 05/2012 s/p 93mm Carpentier Edwards pericardial tissue valve.  [name] Hx of rheumatic fever   . Hypertension   . Melanoma ([place]) 2006   L shoulder  . NICM (nonischemic cardiomyopathy) ([place])     a. 08/2013 s/p Biotronik Ilesto 7 HF1 BiV ICD (ser# [id number]);  b. 11/2016 Echo: EF 35-40%.  . Non-obstructive CAD    a. s/p MI 2001;  b. 12/2016 Cath: LM 20ost, OM2 60, RCA 30p/d, EF 35-45%. Nl R heart filling pressures.  . Permanent atrial fibrillation ([place])    a. 04/2012 s/p AVN & PPM placement (later upgraded to BiV ICD).  [name] Phlebitis   . Stroke ([place])   . Thromboembolism of upper extremity artery ([place])    a. 06/2012 h/o acute ischemia due to thromboembolism radial and ulnar arteries when coumadin held s/p atherectomy - needs bridging if off coumadin  . Tricuspid regurgitation    a. 05/2012 s/p TV annuloplasty @ time of MVR.    Surgical History: Past Surgical History:  Procedure Laterality Date  . ABDOMINAL HYSTERECTOMY    . ABLATION  2013  . ATHERECTOMY  06/2012   left arm after coumadin held without bridging  . BREAST BIOPSY    . CARDIOVASCULAR STRESS TEST  03/2016   WNL  . CATARACT EXTRACTION Bilateral 2007, 2010  . CHOLECYSTECTOMY    . COLONOSCOPY  03/2014   diverticulosis, int hem, rpt 5 yrs for h/o polyps ([place])  . COLONOSCOPY WITH PROPOFOL N/A 11/22/2019   diverticulosis, int hem, rpt 5 yrs [name], [name], MD)  . defibrillator     MEDTRONIC 5086 MRI 52 CM LEAD,  SERIAL # [id number]  . DEXA  07/2016   WNL  . DG ABDOMEN COMPLETE ([place] [facility]X)  02/2013   HH, mod severe erosive gastritis, duodenitis  . ESOPHAGOGASTRODUODENOSCOPY (EGD) WITH PROPOFOL N/A 11/22/2019   mild reactive gastritis, biopsy neg for H pylori [name], [name], MD)  . GALLBLADDER SURGERY  2006  . INSERT / REPLACE / REMOVE PACEMAKER  08/2013  . MITRAL VALVE REPLACEMENT  05/2012  . RIGHT/LEFT HEART CATH AND CORONARY ANGIOGRAPHY N/A 12/29/2016   Procedure: Right/Left Heart Cath and Coronary Angiography;  Surgeon: [name], MD;  Location: [facility] CV LAB;  Service: Cardiovascular;  Laterality: N/A;  . TOTAL ABDOMINAL HYSTERECTOMY W/ BILATERAL SALPINGOOPHORECTOMY  1991   irregular bleeding  . TRICUSPID VALVE  REPLACEMENT  05/2012  . US ECHOCARDIOGRAPHY  02/2014   EF 40%, LA marked dilation, gen LV hypokinesis, mitral prosthetic valve    Home Medications:  Allergies as of 06/11/2020      Reactions   Phytonadione Other (See Comments)   Amiodarone Other (See Comments)   "Irritable"--IV INFUSION   Ciprofloxacin Rash   Rash while on biaxin, cipro, flagyl (unsure which was inciting agent)   Clarithromycin Rash   Rash while on biaxin, cipro, flagyl (unsure which was inciting agent)   Lisinopril Rash   Metronidazole Rash   Rash while on biaxin, cipro, flagyl (unsure which was inciting agent)   Phytonadione [vitamin K1] Other (See Comments)   "extremely faint"--IV INFUSION      Medication List       Accurate as of June 11, 2020 10:27 AM. If you have any questions, ask your nurse or doctor.        acetaminophen 500 MG tablet Commonly known as: TYLENOL Take 500-1,000 mg by mouth every 6 (six) hours as needed (for headaches/pain.).   amitriptyline 10 MG tablet Commonly known as: ELAVIL Take 2 tablets (20 mg total) by mouth at bedtime.   aspirin EC 81 MG tablet Take 81 mg by mouth daily.   bumetanide 0.5 MG tablet Commonly known as: BUMEX TAKE 1 TABLET DAILY. CONTINUE TO MONITOR WEIGHT, IF YOU START GAINING FLUIDS, THEN INCREASE BACK TO 1MG  DAILY   carvedilol 12.5 MG tablet Commonly known as: COREG TAKE 1 TABLET TWICE DAILY  WITH MEALS   Lumigan 0.01 % Soln Generic drug: bimatoprost Place 1 drop into both eyes at bedtime.   Magnesium 250 MG Tabs Take 250 mg by mouth daily.   meclizine 25 MG tablet Commonly known as: ANTIVERT TAKE 1 TABLET TWICE A DAY  AS NEEDED FOR DIZZINESS   Polyethyl Glycol-Propyl Glycol 0.4-0.3 % Soln Place 1 drop into both eyes 3 (three) times daily as needed (for dry eyes.).   sacubitril-valsartan 49-51 MG Commonly known as: ENTRESTO Take 1 tablet (49/51 mg) by mouth twice daily   spironolactone 25 MG tablet Commonly known as: ALDACTONE TAKE  1/2 TABLET (12.5MG      TOTAL) DAILY   VITAMIN B COMPLEX PO Take by mouth daily.   Vitamin D 50 MCG (2000 UT) Caps Take 1 capsule (2,000 Units total) by mouth daily.   vitamin E 180 MG (400 UNITS) capsule Take 400 Units by mouth daily.   warfarin 3 MG tablet Commonly known as: Jantoven Take as directed by the anticoagulation clinic. If you are unsure how to take this medication, talk to your nurse or doctor. Original instructions: TAKE 1 TO 2 TABLETS DAILY  AS DIRECTED BY THE COUMADINCLINIC   ZYRTEC PO Take by mouth as needed.  Allergies:  Allergies  Allergen Reactions  . Phytonadione Other (See Comments)  . Amiodarone Other (See Comments)    "Irritable"--IV INFUSION   . Ciprofloxacin Rash    Rash while on biaxin, cipro, flagyl (unsure which was inciting agent)  . Clarithromycin Rash    Rash while on biaxin, cipro, flagyl (unsure which was inciting agent)  . Lisinopril Rash  . Metronidazole Rash    Rash while on biaxin, cipro, flagyl (unsure which was inciting agent)  . Phytonadione [Vitamin K1] Other (See Comments)    "extremely faint"--IV INFUSION    Family History: Family History  Problem Relation Age of Onset  . CAD Father 66       MI  . Hypertension Brother   . Heart failure Brother        CHF with pacemaker  . Stroke Brother   . Heart failure Sister        CHF  . Stroke Sister   . Diabetes Cousin   . Cancer Maternal Aunt        colon  . Cancer Cousin        breast  . Cancer Cousin        brain tumor  . Basal cell carcinoma Daughter     Social History:  reports that she quit smoking about 30 years ago. Her smoking use included cigarettes. She has a 10.00 pack-year smoking history. She has never used smokeless tobacco. She reports current alcohol use. She reports that she does not use drugs.  ROS:                                        Physical Exam: LMP  (LMP Unknown) Comment: menopause age 31  Constitutional:   Alert and oriented, No acute distress. HEENT: [place] AT, moist mucus membranes.  Trachea midline, no masses. Cardiovascular: No clubbing, cyanosis, or edema. Respiratory: Normal respiratory effort, no increased work of breathing. GI: Abdomen is soft, nontender, nondistended, no abdominal masses GU: No prolapse or stress incontinence.  Moderate atrophy Skin: No rashes, bruises or suspicious lesions. Lymph: No cervical or inguinal adenopathy. Neurologic: Grossly intact, no focal deficits, moving all 4 extremities. Psychiatric: Normal mood and affect.  Laboratory Data: Lab Results  Component Value Date   WBC 7.4 04/18/2020   HGB 10.9 (L) 04/18/2020   HCT 33.2 (L) 04/18/2020   MCV 87.1 04/18/2020   PLT 285 04/18/2020    Lab Results  Component Value Date   CREATININE 1.11 (H) 04/12/2020    No results found for: PSA  No results found for: TESTOSTERONE  Lab Results  Component Value Date   HGBA1C 5.9 08/30/2019    Urinalysis    Component Value Date/Time   COLORURINE Yellow 09/13/2014 1620   APPEARANCEUR Clear 09/13/2014 1620   LABSPEC 1.014 09/13/2014 1620   PHURINE 6.0 09/13/2014 1620   GLUCOSEU Negative 09/13/2014 1620   HGBUR Negative 09/13/2014 1620   BILIRUBINUR Negative 04/16/2020 1231   BILIRUBINUR Negative 09/13/2014 1620   KETONESUR Negative 09/13/2014 1620   PROTEINUR Negative 04/16/2020 1231   PROTEINUR Negative 09/13/2014 1620   UROBILINOGEN 0.2 04/16/2020 1231   NITRITE Negative 04/16/2020 1231   NITRITE Negative 09/13/2014 1620   LEUKOCYTESUR Moderate (2+) (A) 04/16/2020 1231   LEUKOCYTESUR Negative 09/13/2014 1620    Pertinent Imaging: Chart reviewed.  Urine reviewed.  Urine sent for culture  Assessment & Plan: Patient has pelvic  pain.  She does not have frequency.  She probably does not have interstitial cystitis.  She will come back for safety cystoscopy.  We will then discussed the role of a hydrodistention.  The hydrodistention is negative I do not  think she has interstitial cystitis.  Call if urine culture is positive  We talked about a hydrodistention today but that we would need a safety cystoscopy prior no blood in urine.  She was given 4 tablets 500 mg of amoxicillin to take 2 g of amoxicillin 30 minutes before cystoscopy for mechanical heart valve.  There are no diagnoses linked to this encounter.  No follow-ups on file.  Reece Packer, MD  [facility] [address] [place], [place] [address] [phone]

## 2020-06-11 NOTE — Patient Instructions (Signed)
Cystoscopy Cystoscopy is a procedure that is used to help diagnose and sometimes treat conditions that affect the lower urinary tract. The lower urinary tract includes the bladder and the urethra. The urethra is the tube that drains urine from the bladder. Cystoscopy is done using a thin, tube-shaped instrument with a light and camera at the end (cystoscope). The cystoscope may be hard or flexible, depending on the goal of the procedure. The cystoscope is inserted through the urethra, into the bladder. Cystoscopy may be recommended if you have:  Urinary tract infections that keep coming back.  Blood in the urine (hematuria).  An inability to control when you urinate (urinary incontinence) or an overactive bladder.  Unusual cells found in a urine sample.  A blockage in the urethra, such as a urinary stone.  Painful urination.  An abnormality in the bladder found during an intravenous pyelogram (IVP) or CT scan. Cystoscopy may also be done to remove a sample of tissue to be examined under a microscope (biopsy). What are the risks? Generally, this is a safe procedure. However, problems may occur, including:  Infection.  Bleeding.  What happens during the procedure?  1. You will be given one or more of the following: ? A medicine to numb the area (local anesthetic). 2. The area around the opening of your urethra will be cleaned. 3. The cystoscope will be passed through your urethra into your bladder. 4. Germ-free (sterile) fluid will flow through the cystoscope to fill your bladder. The fluid will stretch your bladder so that your health care provider can clearly examine your bladder walls. 5. Your doctor will look at the urethra and bladder. 6. The cystoscope will be removed The procedure may vary among health care providers  What can I expect after the procedure? After the procedure, it is common to have: 1. Some soreness or pain in your abdomen and urethra. 2. Urinary symptoms.  These include: ? Mild pain or burning when you urinate. Pain should stop within a few minutes after you urinate. This may last for up to 1 week. ? A small amount of blood in your urine for several days. ? Feeling like you need to urinate but producing only a small amount of urine. Follow these instructions at home: General instructions  Return to your normal activities as told by your health care provider.   Do not drive for 24 hours if you were given a sedative during your procedure.  Watch for any blood in your urine. If the amount of blood in your urine increases, call your health care provider.  If a tissue sample was removed for testing (biopsy) during your procedure, it is up to you to get your test results. Ask your health care provider, or the department that is doing the test, when your results will be ready.  Drink enough fluid to keep your urine pale yellow.  Keep all follow-up visits as told by your health care provider. This is important. Contact a health care provider if you:  Have pain that gets worse or does not get better with medicine, especially pain when you urinate.  Have trouble urinating.  Have more blood in your urine. Get help right away if you:  Have blood clots in your urine.  Have abdominal pain.  Have a fever or chills.  Are unable to urinate. Summary  Cystoscopy is a procedure that is used to help diagnose and sometimes treat conditions that affect the lower urinary tract.  Cystoscopy is done using   a thin, tube-shaped instrument with a light and camera at the end.  After the procedure, it is common to have some soreness or pain in your abdomen and urethra.  Watch for any blood in your urine. If the amount of blood in your urine increases, call your health care provider.  If you were prescribed an antibiotic medicine, take it as told by your health care provider. Do not stop taking the antibiotic even if you start to feel better. This  information is not intended to replace advice given to you by your health care provider. Make sure you discuss any questions you have with your health care provider. Document Revised: 06/01/2018 Document Reviewed: 06/01/2018 Elsevier Patient Education  2020 Elsevier Inc.   

## 2020-06-17 LAB — CULTURE, URINE COMPREHENSIVE

## 2020-06-18 ENCOUNTER — Telehealth: Payer: Self-pay | Admitting: *Deleted

## 2020-06-18 MED ORDER — NITROFURANTOIN MACROCRYSTAL 100 MG PO CAPS: 100.0000 mg | ORAL_CAPSULE | Freq: Two times a day (BID) | ORAL | 0 refills | Status: DC

## 2020-06-18 NOTE — Telephone Encounter (Signed)
Incoming call from pt stating that she is returning call. Informed pt that RX was sent in and needs to begin taking. Pt gave verbal understanding.

## 2020-06-18 NOTE — Telephone Encounter (Addendum)
Left voicemail, RX sent in to pharmacy. Asked to return call.   ----- Message from Levada Schilling, CMA sent at 06/18/2020  8:31 AM EST -----  ----- Message ----- From: Alfredo Martinez, MD Sent: 06/18/2020   8:28 AM EST To: Levada Schilling, CMA  Macrodantin 100 mg twice a day for 7 days     ----- Message ----- From: Levada Schilling, CMA Sent: 06/18/2020   7:40 AM EST To: Alfredo Martinez, MD   ----- Message ----- From: Interface, [facility] Lab Results In Sent: 06/11/2020   1:36 PM EST To: Jennette Kettle Clinical

## 2020-06-20 ENCOUNTER — Telehealth: Payer: Self-pay

## 2020-06-20 NOTE — Telephone Encounter (Signed)
Cough, chest congestion, fever 101.3 yesterday.  COVID vaccine Pfizer 06/2019, 07/2019.  Boosted with Pfizer 03/22/2020  Pending COVID test through health at work.  If positive discussed option of mAb infusion however with shortage and vaccinated status she may not be eligible for this. However we would send her info to team for review.   If tests negative for covid and ongoing symptoms, discussed coming for curbside flu swab - would try to set up tomorrow PM if able.   If worsening discussed UCC eval.

## 2020-06-20 NOTE — Telephone Encounter (Signed)
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 Pt having chills, dry hacky cough, chest feels tight with wheezing when takes deep breath; pt has scratchy S/T and runny nose.  but no SOB and no CP. Pt had H/A yesterday and today that is not severe but pt is aware she has h/a. Pt has not lost taste or smell. Pt does not have diarrhea or vomiting; pt is concerned about having artificial heart valve and pacemaker with defibrillator; pt has not had a lot of problems with afib since ablation in 2013; CHF, and pt is on a blood thinner. Pt is drinking plenty of fluids, rest, tylenol for fever and self quarantine until gets results back. Pt does not feel like any distress with breathing now. Pt will let Dr Reece Agar know what health at work says about covid test results. Pt wants to know what DR G thinks pt will  expect or do if covid test is positive. Pt request cb.

## 2020-06-20 NOTE — Telephone Encounter (Signed)
Called, left message. Will try later.  

## 2020-06-21 ENCOUNTER — Ambulatory Visit: Payer: Medicare Other | Admitting: Gastroenterology

## 2020-06-21 ENCOUNTER — Other Ambulatory Visit (INDEPENDENT_AMBULATORY_CARE_PROVIDER_SITE_OTHER): Payer: Medicare Other

## 2020-06-21 DIAGNOSIS — R52 Pain, unspecified: Secondary | ICD-10-CM

## 2020-06-21 LAB — POC INFLUENZA A&B (BINAX/QUICKVUE)
Influenza A, POC: NEGATIVE
Influenza B, POC: NEGATIVE

## 2020-06-21 NOTE — Telephone Encounter (Signed)
Pt said her test was negative and she wants to come for the flu swab. She would like a call back.

## 2020-06-21 NOTE — Telephone Encounter (Signed)
Spoke with pt scheduling car-side flu test today at 3:30.

## 2020-06-21 NOTE — Telephone Encounter (Signed)
Lvm asking pt to call back.  If pt has neg COVID results and still has ongoing sxs, need to schedule car-side flu swab.

## 2020-06-27 ENCOUNTER — Other Ambulatory Visit: Payer: Self-pay

## 2020-06-27 ENCOUNTER — Telehealth: Payer: Self-pay | Admitting: Cardiovascular Disease

## 2020-06-27 ENCOUNTER — Ambulatory Visit (INDEPENDENT_AMBULATORY_CARE_PROVIDER_SITE_OTHER): Payer: Medicare Other

## 2020-06-27 DIAGNOSIS — I4821 Permanent atrial fibrillation: Secondary | ICD-10-CM

## 2020-06-27 DIAGNOSIS — Z953 Presence of xenogenic heart valve: Secondary | ICD-10-CM | POA: Diagnosis not present

## 2020-06-27 DIAGNOSIS — Z5181 Encounter for therapeutic drug level monitoring: Secondary | ICD-10-CM

## 2020-06-27 DIAGNOSIS — I4891 Unspecified atrial fibrillation: Secondary | ICD-10-CM | POA: Diagnosis not present

## 2020-06-27 LAB — POCT INR: INR: 5 — AB (ref 2.0–3.0)

## 2020-06-27 NOTE — Telephone Encounter (Signed)
Spoke with the patient. Adv her that the provider portion was faxed to [employer] ([employer]) on 06/14/20. Fax confirmation was received.  Patient sts that she will drop off her portion of the application on 06/29/20 and ask that it be faxed to [employer].  Adv her that once received I will fax it over.

## 2020-06-27 NOTE — Patient Instructions (Signed)
-   skip warfarin today & tomorrow,  - on Monday, just take 1 tablet since you will be taking amoxicillin that day - continue warfarin dosage of 3 mg every day except 6 mg on Mondays - Recheck INR in 3 weeks

## 2020-06-27 NOTE — Telephone Encounter (Signed)
Patient states she dropped off MD portion of assistance application in November and is calling to check status.   Please advise form status and if patient needs to come pick up or drop off her patient portion to be sent in by clinic.

## 2020-06-28 ENCOUNTER — Other Ambulatory Visit: Payer: Self-pay | Admitting: *Deleted

## 2020-06-28 MED ORDER — SPIRONOLACTONE 25 MG PO TABS: ORAL_TABLET | ORAL | 2 refills | Status: DC

## 2020-07-02 ENCOUNTER — Encounter: Payer: Self-pay | Admitting: Urology

## 2020-07-02 ENCOUNTER — Other Ambulatory Visit: Payer: Self-pay

## 2020-07-02 ENCOUNTER — Ambulatory Visit (INDEPENDENT_AMBULATORY_CARE_PROVIDER_SITE_OTHER): Payer: Medicare Other | Admitting: Urology

## 2020-07-02 DIAGNOSIS — N3020 Other chronic cystitis without hematuria: Secondary | ICD-10-CM | POA: Diagnosis not present

## 2020-07-02 MED ORDER — CEFDINIR 300 MG PO CAPS: 300.0000 mg | ORAL_CAPSULE | Freq: Two times a day (BID) | ORAL | 0 refills | Status: AC

## 2020-07-02 MED ORDER — NITROFURANTOIN MACROCRYSTAL 100 MG PO CAPS: 100.0000 mg | ORAL_CAPSULE | Freq: Every day | ORAL | 11 refills | Status: DC

## 2020-07-02 NOTE — Progress Notes (Signed)
07/02/2020 9:09 AM   Jorje Guild Jollie [birth year] [id number]  Referring provider: Ria Bush, MD [address] [place],  [place] [address]  Chief Complaint  Patient presents with  . Cysto    HPI: I was consulted to assess the patient's chronic pelvic pain for more than 1 year.  She gets suprapubic and perineal discomfort.  She has failed 2 treatments for bladder infections as well as vaginal estrogen cream for possible vaginal dryness.  She had a negative CT scan of her kidneys many months ago but did have diverticulitis.  When this was treated it improve things temporarily.  The x-ray actually read diverticulosis but not diverticulitis  She feels the discomfort and sometimes a pressure that comes and goes.  She might feel it more when she sits but is not necessarily worse when she voids.  Is not relieved when she voids.  She voids 2 or 3 times a day.  She voids 4-6 times a night.  She has mild ankle edema.  She takes a diuretic.  She had a bladder suspension many years ago with a hysterectomy  Recently prescribed amitriptyline with some benefit but still has pain  No prolapse or stress incontinence.  Moderate atrophy  Patient has pelvic pain.  She does not have frequency.  She probably does not have interstitial cystitis.  She will come back for safety cystoscopy.  We will then discussed the role of a hydrodistention.  The hydrodistention is negative I do not think she has interstitial cystitis.  Call if urine culture is positive  We talked about a hydrodistention today but that we would need a safety cystoscopy prior no blood in urine.  She was given 4 tablets 500 mg of amoxicillin to take 2 g of amoxicillin 30 minutes before cystoscopy for mechanical heart valve.  Today Frequency stable.  Last culture positive. Patient reported that halfway through the urinary tract infection treatment her pain went away.  She felt much better.  The discomfort although  milder started with pressure last night with increased frequency.  Cystoscopy: Patient underwent flexible cystoscopy utilizing sterile technique.  She had mild bladder erythema with mild cystitis cystica.  Trigone normal.  No carcinoma.  Tolerated procedure well.  No foreign body   PMH: Past Medical History:  Diagnosis Date  . Bleeding of eye   . Chronic systolic heart failure ([place])    a. 11/2016 Echo: EF 35-40%, mild MR w/ nl fxning bioprosthesis, mod dil LA.  . CKD (chronic kidney disease) stage 3, GFR 30-59 ml/min (HCC)    baseline Cr 1.5-2; prior saw nephrologist thought hypertensive nephropathy/renovascular disease  . CVA (cerebral infarction)    a. 2014 occipital lobe - some vision loss when coumadin held and not bridged  . Diverticulitis 06/2019  . Dyslipidemia   . Glaucoma   . H. pylori infection    treated  . H/O mitral valve replacement    a. 05/2012 s/p 65mm Carpentier Edwards pericardial tissue valve.  [name] Hx of rheumatic fever   . Hypertension   . Melanoma ([place]) 2006   L shoulder  . NICM (nonischemic cardiomyopathy) ([place])    a. 08/2013 s/p Biotronik Ilesto 7 HF1 BiV ICD (ser# [id number]);  b. 11/2016 Echo: EF 35-40%.  . Non-obstructive CAD    a. s/p MI 2001;  b. 12/2016 Cath: LM 20ost, OM2 60, RCA 30p/d, EF 35-45%. Nl R heart filling pressures.  . Permanent atrial fibrillation ([place])    a. 04/2012 s/p AVN &  PPM placement (later upgraded to BiV ICD).  [name] Phlebitis   . Stroke ([place])   . 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 06/2012 h/o acute ischemia due to thromboembolism radial and ulnar arteries when coumadin held s/p atherectomy - needs bridging if off coumadin  . Tricuspid regurgitation    a. 05/2012 s/p TV annuloplasty @ time of MVR.    Surgical History: Past Surgical History:  Procedure Laterality Date  . ABDOMINAL HYSTERECTOMY    . ABLATION  2013  . ATHERECTOMY  06/2012   left arm after coumadin held without bridging  . BREAST BIOPSY    .  CARDIOVASCULAR STRESS TEST  03/2016   WNL  . CATARACT EXTRACTION Bilateral 2007, 2010  . CHOLECYSTECTOMY    . COLONOSCOPY  03/2014   diverticulosis, int hem, rpt 5 yrs for h/o polyps ([place])  . COLONOSCOPY WITH PROPOFOL N/A 11/22/2019   diverticulosis, int hem, rpt 5 yrs [name], [name], MD)  . defibrillator     MEDTRONIC 5086 MRI 52 CM LEAD, SERIAL # [id number]  . DEXA  07/2016   WNL  . DG ABDOMEN COMPLETE ([place] [facility])  02/2013   HH, mod severe erosive gastritis, duodenitis  . ESOPHAGOGASTRODUODENOSCOPY (EGD) WITH PROPOFOL N/A 11/22/2019   mild reactive gastritis, biopsy neg for H pylori [name], [name], MD)  . GALLBLADDER SURGERY  2006  . INSERT / REPLACE / REMOVE PACEMAKER  08/2013  . MITRAL VALVE REPLACEMENT  05/2012  . RIGHT/LEFT HEART CATH AND CORONARY ANGIOGRAPHY N/A 12/29/2016   Procedure: Right/Left Heart Cath and Coronary Angiography;  Surgeon: [name], MD;  Location: [facility];  Service: Cardiovascular;  Laterality: N/A;  . TOTAL ABDOMINAL HYSTERECTOMY W/ BILATERAL SALPINGOOPHORECTOMY  1991   irregular bleeding  . TRICUSPID VALVE REPLACEMENT  05/2012  . US ECHOCARDIOGRAPHY  02/2014   EF 40%, LA marked dilation, gen LV hypokinesis, mitral prosthetic valve    Home Medications:  Allergies as of 07/02/2020      Reactions   Phytonadione Other (See Comments)   Amiodarone Other (See Comments)   "Irritable"--IV INFUSION   Ciprofloxacin Rash   Rash while on biaxin, cipro, flagyl (unsure which was inciting agent)   Clarithromycin Rash   Rash while on biaxin, cipro, flagyl (unsure which was inciting agent)   Lisinopril Rash   Metronidazole Rash   Rash while on biaxin, cipro, flagyl (unsure which was inciting agent)   Phytonadione [vitamin K1] Other (See Comments)   "extremely faint"--IV INFUSION   Vitamin K2 Anxiety      Medication List       Accurate as of July 02, 2020  9:09 AM. If you have any questions, ask your nurse or doctor.        STOP taking these  medications   methocarbamol 500 MG tablet Commonly known as: ROBAXIN Stopped by: Reece Packer, MD   nitrofurantoin 100 MG capsule Commonly known as: Macrodantin Stopped by: Reece Packer, MD     TAKE these medications   acetaminophen 500 MG tablet Commonly known as: TYLENOL Take 500-1,000 mg by mouth every 6 (six) hours as needed (for headaches/pain.).   amitriptyline 10 MG tablet Commonly known as: ELAVIL Take 2 tablets (20 mg total) by mouth at bedtime.   amoxicillin 500 MG capsule Commonly known as: AMOXIL Patient will take 4 capsules 1/2 hour prior to procedure   aspirin EC 81 MG tablet Take 81 mg by mouth daily.   bumetanide 0.5 MG tablet  Commonly known as: BUMEX TAKE 1 TABLET DAILY. CONTINUE TO MONITOR WEIGHT, IF YOU START GAINING FLUIDS, THEN INCREASE BACK TO 1MG  DAILY   carvedilol 12.5 MG tablet Commonly known as: COREG TAKE 1 TABLET TWICE DAILY  WITH MEALS   Lumigan 0.01 % Soln Generic drug: bimatoprost Place 1 drop into both eyes at bedtime.   Magnesium 250 MG Tabs Take 250 mg by mouth daily.   meclizine 25 MG tablet Commonly known as: ANTIVERT TAKE 1 TABLET TWICE A DAY  AS NEEDED FOR DIZZINESS   Polyethyl Glycol-Propyl Glycol 0.4-0.3 % Soln Place 1 drop into both eyes 3 (three) times daily as needed (for dry eyes.).   sacubitril-valsartan 49-51 MG Commonly known as: ENTRESTO Take 1 tablet (49/51 mg) by mouth twice daily   spironolactone 25 MG tablet Commonly known as: ALDACTONE TAKE 1/2 TABLET (12.5MG  TOTAL) DAILY   VITAMIN B COMPLEX PO Take by mouth daily.   Vitamin D 50 MCG (2000 UT) Caps Take 1 capsule (2,000 Units total) by mouth daily.   vitamin E 180 MG (400 UNITS) capsule Take 400 Units by mouth daily.   warfarin 3 MG tablet Commonly known as: Jantoven Take as directed by the anticoagulation clinic. If you are unsure how to take this medication, talk to your nurse or doctor. Original instructions: TAKE 1 TO 2 TABLETS  DAILY  AS DIRECTED BY THE COUMADINCLINIC   ZYRTEC PO Take by mouth as needed.       Allergies:  Allergies  Allergen Reactions  . Phytonadione Other (See Comments)  . Amiodarone Other (See Comments)    "Irritable"--IV INFUSION   . Ciprofloxacin Rash    Rash while on biaxin, cipro, flagyl (unsure which was inciting agent)  . Clarithromycin Rash    Rash while on biaxin, cipro, flagyl (unsure which was inciting agent)  . Lisinopril Rash  . Metronidazole Rash    Rash while on biaxin, cipro, flagyl (unsure which was inciting agent)  . Phytonadione [Vitamin K1] Other (See Comments)    "extremely faint"--IV INFUSION  . Vitamin K2 Anxiety    Family History: Family History  Problem Relation Age of Onset  . CAD Father 4       MI  . Hypertension Brother   . Heart failure Brother        CHF with pacemaker  . Stroke Brother   . Heart failure Sister        CHF  . Stroke Sister   . Diabetes Cousin   . Cancer Maternal Aunt        colon  . Cancer Cousin        breast  . Cancer Cousin        brain tumor  . Basal cell carcinoma Daughter     Social History:  reports that she quit smoking about 30 years ago. Her smoking use included cigarettes. She has a 10.00 pack-year smoking history. She has never used smokeless tobacco. She reports current alcohol use. She reports that she does not use drugs.  ROS:                                        Physical Exam: LMP  (LMP Unknown) Comment: menopause age 36  Constitutional:  Alert and oriented, No acute distress.   Laboratory Data: Lab Results  Component Value Date   WBC 7.4 04/18/2020   HGB 10.9 (L) 04/18/2020  HCT 33.2 (L) 04/18/2020   MCV 87.1 04/18/2020   PLT 285 04/18/2020    Lab Results  Component Value Date   CREATININE 1.11 (H) 04/12/2020    No results found for: PSA  No results found for: TESTOSTERONE  Lab Results  Component Value Date   HGBA1C 5.9 08/30/2019    Urinalysis     Component Value Date/Time   COLORURINE Yellow 09/13/2014 1620   APPEARANCEUR Cloudy (A) 06/11/2020 1045   LABSPEC 1.014 09/13/2014 1620   PHURINE 6.0 09/13/2014 1620   GLUCOSEU Negative 06/11/2020 1045   GLUCOSEU Negative 09/13/2014 1620   HGBUR Negative 09/13/2014 1620   BILIRUBINUR Negative 06/11/2020 1045   BILIRUBINUR Negative 09/13/2014 1620   KETONESUR Negative 09/13/2014 1620   PROTEINUR Negative 06/11/2020 1045   PROTEINUR Negative 09/13/2014 1620   UROBILINOGEN 0.2 04/16/2020 1231   NITRITE Positive (A) 06/11/2020 1045   NITRITE Negative 09/13/2014 1620   LEUKOCYTESUR 2+ (A) 06/11/2020 1045   LEUKOCYTESUR Negative 09/13/2014 1620    Pertinent Imaging:   Assessment & Plan: Based on last culture I called in Septra DS 1 tablet twice a day for 7 days followed by daily Macrodantin.  I will see her on 100 mg of Macrodantin in about 7 weeks.  Call if urine culture differs  1. Chronic cystitis  - Urinalysis, Complete - CULTURE, URINE COMPREHENSIVE   No follow-ups on file.  Reece Packer, MD  [facility] [address], [place], [place] [address] [phone]

## 2020-07-02 NOTE — Addendum Note (Signed)
Addended by: Alvera Novel on: 07/02/2020 09:44 AM   Modules accepted: Orders

## 2020-07-02 NOTE — Telephone Encounter (Signed)
Patient assistance application and documentation dropped of by the patient. Faxed to [employer] ([employer]) along with the provider portion of the application (refax). Fax confirmation received

## 2020-07-03 LAB — MICROSCOPIC EXAMINATION

## 2020-07-03 LAB — URINALYSIS, COMPLETE
Bilirubin, UA: NEGATIVE
Glucose, UA: NEGATIVE
Ketones, UA: NEGATIVE
Nitrite, UA: NEGATIVE
Protein,UA: NEGATIVE
Specific Gravity, UA: 1.015 (ref 1.005–1.030)
Urobilinogen, Ur: 0.2 mg/dL (ref 0.2–1.0)
pH, UA: 6 (ref 5.0–7.5)

## 2020-07-04 ENCOUNTER — Telehealth: Payer: Self-pay

## 2020-07-04 DIAGNOSIS — I4821 Permanent atrial fibrillation: Secondary | ICD-10-CM

## 2020-07-04 DIAGNOSIS — Z7901 Long term (current) use of anticoagulants: Secondary | ICD-10-CM

## 2020-07-04 DIAGNOSIS — Z5181 Encounter for therapeutic drug level monitoring: Secondary | ICD-10-CM

## 2020-07-04 LAB — CULTURE, URINE COMPREHENSIVE

## 2020-07-04 NOTE — Telephone Encounter (Signed)
Spoke w/ pt.  She would feel more comfortable having her INR checked this week since her INR was 5.0 last week and she has started these meds. She worked today and did not check her messages until she got home. She works @ [employer] Friday. Advised her that I can put an order in for venipuncture to be drawn @ the [facility] and we can dose her based on those results.  She is agreeable to this and will go to the lab after she gets off work around [employer]. Advised her that they will most likely result tomorrow and she will get call from one of the ladies in [place] or if they are not resulted by the end of the day, I will call her on Friday w/ instructions.  She is appreciative of the call.

## 2020-07-04 NOTE — Telephone Encounter (Addendum)
Received note in envelope on my desk this morning dated Monday, 07/02/20: "Today I was pt on cefdinir 300 mg (omnicef) BID x 7 days.  Then after that I start nitrofurantoin (microdantin) daily - probably indefinitely.  I am due back in to see you on 07/18/20.  Do I need to do anything differently?"  Left message for pt to call back, or if she is working @ [facility] today, to come down for INR check when she gets off and I will work her in for INR check.

## 2020-07-05 ENCOUNTER — Other Ambulatory Visit
Admission: RE | Admit: 2020-07-05 | Discharge: 2020-07-05 | Disposition: A | Discharge status: Home / Self Care | Payer: Medicare Other | Source: Ambulatory Visit | Attending: Cardiovascular Disease | Admitting: Cardiovascular Disease

## 2020-07-05 ENCOUNTER — Other Ambulatory Visit: Payer: Self-pay

## 2020-07-05 ENCOUNTER — Ambulatory Visit (INDEPENDENT_AMBULATORY_CARE_PROVIDER_SITE_OTHER): Payer: Medicare Other

## 2020-07-05 DIAGNOSIS — Z5181 Encounter for therapeutic drug level monitoring: Secondary | ICD-10-CM | POA: Diagnosis not present

## 2020-07-05 DIAGNOSIS — I4891 Unspecified atrial fibrillation: Secondary | ICD-10-CM | POA: Diagnosis not present

## 2020-07-05 DIAGNOSIS — I4821 Permanent atrial fibrillation: Secondary | ICD-10-CM | POA: Insufficient documentation

## 2020-07-05 DIAGNOSIS — Z7901 Long term (current) use of anticoagulants: Secondary | ICD-10-CM | POA: Insufficient documentation

## 2020-07-05 DIAGNOSIS — Z953 Presence of xenogenic heart valve: Secondary | ICD-10-CM

## 2020-07-05 LAB — PROTIME-INR
INR: 1.8 — ABNORMAL HIGH (ref 0.8–1.2)
Prothrombin Time: 19.8 seconds — ABNORMAL HIGH (ref 11.4–15.2)

## 2020-07-05 NOTE — Telephone Encounter (Signed)
INR results received and dosed. Please refer to Anticoagulation Encounter from today for details and next appointment set for 07/18/2020.

## 2020-07-05 NOTE — Patient Instructions (Signed)
Description   -Called spoke with pt, instructed pt to take 2 tablets today and tomorrow, then resume same dosage of warfarin 3 mg every day except 6 mg on Mondays - Recheck INR in 2 weeks

## 2020-07-18 ENCOUNTER — Other Ambulatory Visit: Payer: Self-pay

## 2020-07-18 ENCOUNTER — Ambulatory Visit (INDEPENDENT_AMBULATORY_CARE_PROVIDER_SITE_OTHER): Payer: Medicare Other

## 2020-07-18 DIAGNOSIS — Z953 Presence of xenogenic heart valve: Secondary | ICD-10-CM

## 2020-07-18 DIAGNOSIS — I4821 Permanent atrial fibrillation: Secondary | ICD-10-CM

## 2020-07-18 DIAGNOSIS — Z5181 Encounter for therapeutic drug level monitoring: Secondary | ICD-10-CM

## 2020-07-18 DIAGNOSIS — I4891 Unspecified atrial fibrillation: Secondary | ICD-10-CM

## 2020-07-18 LAB — POCT INR: INR: 2.4 (ref 2.0–3.0)

## 2020-07-18 NOTE — Patient Instructions (Signed)
-   take extra 1/2 tablet tonight, then  - continue dosage of warfarin 3 mg every day except 6 mg on Mondays - Recheck INR in 2 weeks

## 2020-07-20 ENCOUNTER — Telehealth: Payer: Self-pay | Admitting: Internal Medicine

## 2020-07-20 NOTE — Telephone Encounter (Signed)
Called by Ms. Elizabeth Hall this evening after she mistakenly took 2 doses of entresto, coreg, and warfarin this evening. She is completely asymptomatic. BP was 144/78. Asked her to hold tomorrow AMs dose of coreg + entresto and resumed tomorrow evening. I also asked her to hold her dose of warfarin tomorrow evening and resumed Sunday.

## 2020-07-23 ENCOUNTER — Telehealth: Payer: Self-pay | Admitting: Cardiovascular Disease

## 2020-07-23 MED ORDER — CARVEDILOL 12.5 MG PO TABS
12.5000 mg | ORAL_TABLET | Freq: Two times a day (BID) | ORAL | 2 refills | Status: DC
Start: 2020-07-23 — End: 2021-04-08

## 2020-07-23 NOTE — Telephone Encounter (Signed)
Received fax from [facility] requesting refills for Carvedilol 12.5 mg. Rx request sent to pharmacy.

## 2020-07-23 NOTE — Telephone Encounter (Signed)
Reviewed. No further action needed at this time. Appreciative of Dr. Ernestene Kiel recommendations.  Loel Dubonnet, NP

## 2020-07-24 NOTE — Telephone Encounter (Signed)
Incoming fax from [facility] Patient is approved for patient assistance through patient assistance until the end of the calendar year.

## 2020-07-26 ENCOUNTER — Other Ambulatory Visit: Payer: Self-pay

## 2020-07-26 MED ORDER — BUMETANIDE 0.5 MG PO TABS
0.5000 mg | ORAL_TABLET | ORAL | 0 refills | Status: DC
Start: 2020-07-26 — End: 2021-02-15

## 2020-07-31 ENCOUNTER — Telehealth: Payer: Self-pay | Admitting: Family Medicine

## 2020-07-31 NOTE — Telephone Encounter (Signed)
LVM for pt to rtn my call to r/s appt with nha on 09/06/20

## 2020-08-01 ENCOUNTER — Other Ambulatory Visit: Payer: Self-pay

## 2020-08-01 ENCOUNTER — Ambulatory Visit (INDEPENDENT_AMBULATORY_CARE_PROVIDER_SITE_OTHER): Payer: Medicare Other

## 2020-08-01 DIAGNOSIS — Z5181 Encounter for therapeutic drug level monitoring: Secondary | ICD-10-CM

## 2020-08-01 DIAGNOSIS — I4821 Permanent atrial fibrillation: Secondary | ICD-10-CM

## 2020-08-01 DIAGNOSIS — Z953 Presence of xenogenic heart valve: Secondary | ICD-10-CM | POA: Diagnosis not present

## 2020-08-01 DIAGNOSIS — I4891 Unspecified atrial fibrillation: Secondary | ICD-10-CM | POA: Diagnosis not present

## 2020-08-01 LAB — POCT INR: INR: 2 (ref 2.0–3.0)

## 2020-08-01 NOTE — Patient Instructions (Signed)
-   take extra 1/2 tablet tonight, then - START NEW dosage of warfarin 3 mg every day except 6 mg on Mondays & Fridays - Recheck INR in 2 weeks

## 2020-08-02 ENCOUNTER — Telehealth: Payer: Self-pay | Admitting: Urology

## 2020-08-07 ENCOUNTER — Ambulatory Visit (INDEPENDENT_AMBULATORY_CARE_PROVIDER_SITE_OTHER): Payer: Medicare Other

## 2020-08-07 DIAGNOSIS — I428 Other cardiomyopathies: Secondary | ICD-10-CM

## 2020-08-07 LAB — CUP PACEART REMOTE DEVICE CHECK
Date Time Interrogation Session: 20220214165553
Implantable Lead Implant Date: 20150316
Implantable Lead Implant Date: 20150316
Implantable Lead Location: 753858
Implantable Lead Location: 753860
Implantable Lead Model: 346
Implantable Lead Serial Number: 25130583
Implantable Pulse Generator Implant Date: 20150316
Pulse Gen Model: 383547
Pulse Gen Serial Number: 60765179

## 2020-08-14 NOTE — Progress Notes (Signed)
Remote ICD transmission.   

## 2020-08-15 ENCOUNTER — Ambulatory Visit (INDEPENDENT_AMBULATORY_CARE_PROVIDER_SITE_OTHER): Payer: Medicare Other

## 2020-08-15 ENCOUNTER — Other Ambulatory Visit: Payer: Self-pay

## 2020-08-15 DIAGNOSIS — Z5181 Encounter for therapeutic drug level monitoring: Secondary | ICD-10-CM | POA: Diagnosis not present

## 2020-08-15 DIAGNOSIS — I4821 Permanent atrial fibrillation: Secondary | ICD-10-CM | POA: Diagnosis not present

## 2020-08-15 DIAGNOSIS — Z953 Presence of xenogenic heart valve: Secondary | ICD-10-CM | POA: Diagnosis not present

## 2020-08-15 DIAGNOSIS — I4891 Unspecified atrial fibrillation: Secondary | ICD-10-CM

## 2020-08-15 LAB — POCT INR: INR: 3 (ref 2.0–3.0)

## 2020-08-15 NOTE — Patient Instructions (Signed)
-   continue dosage of warfarin 3 mg every day except 6 mg on Mondays & Fridays - Recheck INR in 3 weeks

## 2020-08-15 NOTE — Telephone Encounter (Signed)
Chart opened in error

## 2020-08-20 ENCOUNTER — Ambulatory Visit: Payer: Medicare Other | Admitting: Urology

## 2020-09-03 ENCOUNTER — Ambulatory Visit: Payer: Medicare Other | Admitting: Urology

## 2020-09-05 ENCOUNTER — Other Ambulatory Visit: Payer: Self-pay

## 2020-09-05 ENCOUNTER — Ambulatory Visit (INDEPENDENT_AMBULATORY_CARE_PROVIDER_SITE_OTHER): Payer: Medicare Other

## 2020-09-05 DIAGNOSIS — I4821 Permanent atrial fibrillation: Secondary | ICD-10-CM

## 2020-09-05 DIAGNOSIS — Z5181 Encounter for therapeutic drug level monitoring: Secondary | ICD-10-CM | POA: Diagnosis not present

## 2020-09-05 DIAGNOSIS — I4891 Unspecified atrial fibrillation: Secondary | ICD-10-CM | POA: Diagnosis not present

## 2020-09-05 DIAGNOSIS — Z953 Presence of xenogenic heart valve: Secondary | ICD-10-CM

## 2020-09-05 LAB — POCT INR: INR: 3.5 — AB (ref 2.0–3.0)

## 2020-09-05 NOTE — Patient Instructions (Signed)
-   skip warfarin tonight, then -  continue dosage of warfarin 3 mg every day except 6 mg on Mondays & Fridays - Recheck INR in 3 weeks

## 2020-09-06 ENCOUNTER — Other Ambulatory Visit: Payer: Medicare Other

## 2020-09-06 ENCOUNTER — Other Ambulatory Visit: Payer: Self-pay | Admitting: Family Medicine

## 2020-09-06 ENCOUNTER — Ambulatory Visit: Payer: Medicare Other

## 2020-09-06 DIAGNOSIS — E785 Hyperlipidemia, unspecified: Secondary | ICD-10-CM

## 2020-09-06 DIAGNOSIS — R7303 Prediabetes: Secondary | ICD-10-CM

## 2020-09-06 DIAGNOSIS — N1832 Chronic kidney disease, stage 3b: Secondary | ICD-10-CM

## 2020-09-06 DIAGNOSIS — I4821 Permanent atrial fibrillation: Secondary | ICD-10-CM

## 2020-09-07 ENCOUNTER — Other Ambulatory Visit (INDEPENDENT_AMBULATORY_CARE_PROVIDER_SITE_OTHER): Payer: Medicare Other

## 2020-09-07 ENCOUNTER — Other Ambulatory Visit: Payer: Self-pay

## 2020-09-07 ENCOUNTER — Ambulatory Visit (INDEPENDENT_AMBULATORY_CARE_PROVIDER_SITE_OTHER): Payer: Medicare Other

## 2020-09-07 DIAGNOSIS — E785 Hyperlipidemia, unspecified: Secondary | ICD-10-CM

## 2020-09-07 DIAGNOSIS — N1832 Chronic kidney disease, stage 3b: Secondary | ICD-10-CM | POA: Diagnosis not present

## 2020-09-07 DIAGNOSIS — I4821 Permanent atrial fibrillation: Secondary | ICD-10-CM | POA: Diagnosis not present

## 2020-09-07 DIAGNOSIS — R7303 Prediabetes: Secondary | ICD-10-CM

## 2020-09-07 DIAGNOSIS — Z0000 Encounter for general adult medical examination without abnormal findings: Secondary | ICD-10-CM | POA: Diagnosis not present

## 2020-09-07 LAB — CBC WITH DIFFERENTIAL/PLATELET
Basophils Absolute: 0.1 10*3/uL (ref 0.0–0.1)
Basophils Relative: 1.5 % (ref 0.0–3.0)
Eosinophils Absolute: 0.3 10*3/uL (ref 0.0–0.7)
Eosinophils Relative: 5.1 % — ABNORMAL HIGH (ref 0.0–5.0)
HCT: 30.6 % — ABNORMAL LOW (ref 36.0–46.0)
Hemoglobin: 10.3 g/dL — ABNORMAL LOW (ref 12.0–15.0)
Lymphocytes Relative: 36.5 % (ref 12.0–46.0)
Lymphs Abs: 1.9 10*3/uL (ref 0.7–4.0)
MCHC: 33.8 g/dL (ref 30.0–36.0)
MCV: 83.7 fl (ref 78.0–100.0)
Monocytes Absolute: 0.8 10*3/uL (ref 0.1–1.0)
Monocytes Relative: 14.7 % — ABNORMAL HIGH (ref 3.0–12.0)
Neutro Abs: 2.2 10*3/uL (ref 1.4–7.7)
Neutrophils Relative %: 42.2 % — ABNORMAL LOW (ref 43.0–77.0)
Platelets: 258 10*3/uL (ref 150.0–400.0)
RBC: 3.65 Mil/uL — ABNORMAL LOW (ref 3.87–5.11)
RDW: 16.6 % — ABNORMAL HIGH (ref 11.5–15.5)
WBC: 5.3 10*3/uL (ref 4.0–10.5)

## 2020-09-07 LAB — HEMOGLOBIN A1C: Hgb A1c MFr Bld: 6 % (ref 4.6–6.5)

## 2020-09-07 LAB — LIPID PANEL
Cholesterol: 137 mg/dL (ref 0–200)
HDL: 61.3 mg/dL (ref >39.00)
LDL Cholesterol: 67 mg/dL (ref 0–99)
NonHDL: 75.62
Total CHOL/HDL Ratio: 2
Triglycerides: 44 mg/dL (ref 0.0–149.0)
VLDL: 8.8 mg/dL (ref 0.0–40.0)

## 2020-09-07 LAB — COMPREHENSIVE METABOLIC PANEL
ALT: 10 U/L (ref 0–35)
AST: 17 U/L (ref 0–37)
Albumin: 4.2 g/dL (ref 3.5–5.2)
Alkaline Phosphatase: 71 U/L (ref 39–117)
BUN: 28 mg/dL — ABNORMAL HIGH (ref 6–23)
CO2: 25 mEq/L (ref 19–32)
Calcium: 10 mg/dL (ref 8.4–10.5)
Chloride: 104 mEq/L (ref 96–112)
Creatinine, Ser: 1.05 mg/dL (ref 0.40–1.20)
GFR: 50.99 mL/min — ABNORMAL LOW (ref >60.00)
Glucose, Bld: 99 mg/dL (ref 70–99)
Potassium: 4.5 mEq/L (ref 3.5–5.1)
Sodium: 136 mEq/L (ref 135–145)
Total Bilirubin: 0.5 mg/dL (ref 0.2–1.2)
Total Protein: 7.1 g/dL (ref 6.0–8.3)

## 2020-09-07 LAB — VITAMIN D 25 HYDROXY (VIT D DEFICIENCY, FRACTURES): VITD: 75.89 ng/mL (ref 30.00–100.00)

## 2020-09-07 NOTE — Progress Notes (Signed)
PCP notes:  Health Maintenance: Mammogram- scheduled 10/2020 through Dr. Orvan Seen    Abnormal Screenings: none   Patient concerns: none   Nurse concerns: none   Next PCP appt.: 09/14/2020 @ 2:30 pm

## 2020-09-07 NOTE — Progress Notes (Signed)
Subjective:   Elizabeth Hall is a 80 y.o. female who presents for Medicare Annual (Subsequent) preventive examination.  Review of Systems: N/A      I connected with the patient today by telephone and verified that I am speaking with the correct person using two identifiers. Location patient: home Location nurse: work Persons participating in the telephone visit: patient, nurse.   I discussed the limitations, risks, security and privacy concerns of performing an evaluation and management service by telephone and the availability of in person appointments. I also discussed with the patient that there may be a patient responsible charge related to this service. The patient expressed understanding and verbally consented to this telephonic visit.        Cardiac Risk Factors include: advanced age (>42men, >69 women);dyslipidemia     Objective:    Today's Vitals   There is no height or weight on file to calculate BMI.  Advanced Directives 09/07/2020 01/03/2020 11/22/2019 08/29/2019 08/02/2018 07/20/2017 05/15/2017  Does Patient Have a Medical Advance Directive? Yes No Yes Yes Yes Yes Yes  Type of Paramedic of [place];Living will - Healthcare Power of [place];Living will [place];Living will [place];Living will Living will  Does patient want to make changes to medical advance directive? - - - - - - No - Patient declined  Copy of [place] in Chart? Yes - validated most recent copy scanned in chart (See row information) - No - copy requested Yes - validated most recent copy scanned in chart (See row information) Yes - validated most recent copy scanned in chart (See row information) Yes -  Would patient like information on creating a medical advance directive? - No - Patient declined - - - - No - Patient declined    Current Medications (verified) Outpatient Encounter  Medications as of 09/07/2020  Medication Sig  . acetaminophen (TYLENOL) 500 MG tablet Take 500-1,000 mg by mouth every 6 (six) hours as needed (for headaches/pain.).  [name] amitriptyline (ELAVIL) 10 MG tablet Take 2 tablets (20 mg total) by mouth at bedtime.  [name] aspirin EC 81 MG tablet Take 81 mg by mouth daily.  . B Complex Vitamins (VITAMIN B COMPLEX PO) Take by mouth daily.  . bumetanide (BUMEX) 0.5 MG tablet Take 1 tablet (0.5 mg total) by mouth every other day.  . carvedilol (COREG) 12.5 MG tablet Take 1 tablet (12.5 mg total) by mouth 2 (two) times daily with a meal.  . Cetirizine HCl (ZYRTEC PO) Take by mouth as needed.  . Cholecalciferol (VITAMIN D) 50 MCG (2000 UT) CAPS Take 1 capsule (2,000 Units total) by mouth daily.  [name] LUMIGAN 0.01 % SOLN Place 1 drop into both eyes at bedtime.   . Magnesium 250 MG TABS Take 250 mg by mouth daily.  . meclizine (ANTIVERT) 25 MG tablet TAKE 1 TABLET TWICE A DAY  AS NEEDED FOR DIZZINESS  . nitrofurantoin (MACRODANTIN) 100 MG capsule Take 1 capsule (100 mg total) by mouth daily.  Vladimir Faster Glycol-Propyl Glycol 0.4-0.3 % SOLN Place 1 drop into both eyes 3 (three) times daily as needed (for dry eyes.).  [name] sacubitril-valsartan (ENTRESTO) 49-51 MG Take 1 tablet (49/51 mg) by mouth twice daily  . spironolactone (ALDACTONE) 25 MG tablet TAKE 1/2 TABLET (12.5MG  TOTAL) DAILY  . vitamin E 400 UNIT capsule Take 400 Units by mouth daily.  [name] warfarin (JANTOVEN) 3 MG tablet TAKE 1 TO 2 TABLETS  DAILY  AS DIRECTED BY THE COUMADINCLINIC  . amoxicillin (AMOXIL) 500 MG capsule Patient will take 4 capsules 1/2 hour prior to procedure   No facility-administered encounter medications on file as of 09/07/2020.    Allergies (verified) Phytonadione, Amiodarone, Ciprofloxacin, Clarithromycin, Lisinopril, Metronidazole, Phytonadione [vitamin k1], and Vitamin k2   History: Past Medical History:  Diagnosis Date  . Bleeding of eye   . Chronic systolic heart failure ([place])     a. 11/2016 Echo: EF 35-40%, mild MR w/ nl fxning bioprosthesis, mod dil LA.  . CKD (chronic kidney disease) stage 3, GFR 30-59 ml/min (HCC)    baseline Cr 1.5-2; prior saw nephrologist thought hypertensive nephropathy/renovascular disease  . CVA (cerebral infarction)    a. 2014 occipital lobe - some vision loss when coumadin held and not bridged  . Diverticulitis 06/2019  . Dyslipidemia   . Glaucoma   . H. pylori infection    treated  . H/O mitral valve replacement    a. 05/2012 s/p 76mm Carpentier Edwards pericardial tissue valve.  [name] Hx of rheumatic fever   . Hypertension   . Melanoma ([place]) 2006   L shoulder  . NICM (nonischemic cardiomyopathy) ([place])    a. 08/2013 s/p Biotronik Ilesto 7 HF1 BiV ICD (ser# [id number]);  b. 11/2016 Echo: EF 35-40%.  . Non-obstructive CAD    a. s/p MI 2001;  b. 12/2016 Cath: LM 20ost, OM2 60, RCA 30p/d, EF 35-45%. Nl R heart filling pressures.  . Permanent atrial fibrillation ([place])    a. 04/2012 s/p AVN & PPM placement (later upgraded to BiV ICD).  [name] Phlebitis   . Stroke ([place])   . 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 06/2012 h/o acute ischemia due to thromboembolism radial and ulnar arteries when coumadin held s/p atherectomy - needs bridging if off coumadin  . Tricuspid regurgitation    a. 05/2012 s/p TV annuloplasty @ time of MVR.   Past Surgical History:  Procedure Laterality Date  . ABDOMINAL HYSTERECTOMY    . ABLATION  2013  . ATHERECTOMY  06/2012   left arm after coumadin held without bridging  . BREAST BIOPSY    . CARDIOVASCULAR STRESS TEST  03/2016   WNL  . CATARACT EXTRACTION Bilateral 2007, 2010  . CHOLECYSTECTOMY    . COLONOSCOPY  03/2014   diverticulosis, int hem, rpt 5 yrs for h/o polyps ([place])  . COLONOSCOPY WITH PROPOFOL N/A 11/22/2019   diverticulosis, int hem, rpt 5 yrs [name], [name], MD)  . defibrillator     MEDTRONIC 5086 MRI 52 CM LEAD, SERIAL # [id number]  . DEXA  07/2016   WNL  . DG ABDOMEN COMPLETE ([place] HX)   02/2013   HH, mod severe erosive gastritis, duodenitis  . ESOPHAGOGASTRODUODENOSCOPY (EGD) WITH PROPOFOL N/A 11/22/2019   mild reactive gastritis, biopsy neg for H pylori [name], [name], MD)  . GALLBLADDER SURGERY  2006  . INSERT / REPLACE / REMOVE PACEMAKER  08/2013  . MITRAL VALVE REPLACEMENT  05/2012  . RIGHT/LEFT HEART CATH AND CORONARY ANGIOGRAPHY N/A 12/29/2016   Procedure: Right/Left Heart Cath and Coronary Angiography;  Surgeon: [name], MD;  Location: [facility];  Service: Cardiovascular;  Laterality: N/A;  . TOTAL ABDOMINAL HYSTERECTOMY W/ BILATERAL SALPINGOOPHORECTOMY  1991   irregular bleeding  . TRICUSPID VALVE REPLACEMENT  05/2012  . US ECHOCARDIOGRAPHY  02/2014   EF 40%, LA marked dilation, gen LV hypokinesis, mitral prosthetic valve   Family History  Problem Relation  Age of Onset  . CAD Father 29       MI  . Hypertension Brother   . Heart failure Brother        CHF with pacemaker  . Stroke Brother   . Heart failure Sister        CHF  . Stroke Sister   . Diabetes Cousin   . Cancer Maternal Aunt        colon  . Cancer Cousin        breast  . Cancer Cousin        brain tumor  . Basal cell carcinoma Daughter    Social History   Socioeconomic History  . Marital status: Widowed    Spouse name: Not on file  . Number of children: Not on file  . Years of education: Not on file  . Highest education level: Not on file  Occupational History  . Not on file  Tobacco Use  . Smoking status: Former Smoker    Packs/day: 0.50    Years: 20.00    Pack years: 10.00    Types: Cigarettes    Quit date: 02/04/1990    Years since quitting: 30.6  . Smokeless tobacco: Never Used  Vaping Use  . Vaping Use: Never used  Substance and Sexual Activity  . Alcohol use: Yes    Alcohol/week: 0.0 standard drinks    Comment: occasional  . Drug use: No  . Sexual activity: Not Currently    Birth control/protection: None  Other Topics Concern  . 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 Difficulty of Paying Living Expenses: Not hard at all  Food Insecurity: No Food Insecurity  . Worried About Charity fundraiser in the Last Year: Never true  . Ran Out of Food in the Last Year: Never true  Transportation Needs: No Transportation Needs  . Lack of Transportation (Medical): No  . Lack of Transportation (Non-Medical): No  Physical Activity: Inactive  . Days of Exercise per Week: 0 days  . Minutes of Exercise per Session: 0 min  Stress: No Stress Concern Present  . Feeling of Stress : Not at all  Social Connections: Not on file    Tobacco Counseling Counseling given: Not Answered   Clinical Intake:  Pre-visit preparation completed: Yes  Pain : No/denies pain     Nutritional Risks: None Diabetes: No  How often do you need to have someone help you when you read instructions, pamphlets, or other written materials from your doctor or pharmacy?: 1 - Never What is the last grade level you completed in school?: RN school  Diabetic: no Nutrition Risk Assessment:  Has the patient had any N/V/D within the last 2 months?  No  Does the patient have any non-healing wounds?  No  Has the patient had any unintentional weight loss or weight gain?  No   Diabetes:  Is the patient diabetic?  No  If diabetic, was a CBG obtained today?  N/A Did the patient bring in their glucometer from home?  N/A How  often do you monitor your CBG's? N/A.   Financial Strains and Diabetes Management:  Are you having any financial strains with the device, your supplies or your medication? N/A.   Does the patient want to be seen by Chronic Care Management for management of their diabetes?  N/A Would the patient like to be referred to a Nutritionist or for Diabetic Management?  N/A   Interpreter Needed?: No  Information entered by :: CJohnson, LPN   Activities of Daily Living In your present state of health, do you have any difficulty performing the following activities: 09/07/2020  Hearing? N  Vision? N  Difficulty concentrating or making decisions? N  Walking or climbing stairs? N  Dressing or bathing? N  Doing errands, shopping? N  Preparing Food and eating ? N  Using the Toilet? N  In the past six months, have you accidently leaked urine? Y  Comment wears pad sometimes  Do you have problems with loss of bowel control? N  Managing your Medications? N  Managing your Finances? N  Housekeeping or managing your Housekeeping? N  Some recent data might be hidden    Patient Care Team: Ria Bush, MD as PCP - General (Family Medicine) [name], MD as PCP - Cardiology (Cardiology) [name] [name], MD as Referring Physician (Ophthalmology) [name], MD as Consulting Physician (Obstetrics and Gynecology) [name], MD as Consulting Physician (Cardiology) [name], MD as Consulting Physician (Dermatology)  Indicate any recent Medical Services you may have received from other than [facility] providers in the past year (date may be approximate).     Assessment:   This is a routine wellness examination for [place].  Hearing/Vision screen  Hearing Screening   125Hz  250Hz  500Hz  1000Hz  2000Hz  3000Hz  4000Hz  6000Hz  8000Hz   Right ear:           Left ear:           Vision Screening Comments: Patient gets annual eye exams   Dietary issues and exercise activities discussed: Current Exercise Habits: The patient does not participate in regular exercise at present, Exercise limited by: None identified  Goals    . Follow up with Primary Care  Provider     Starting 08/02/2018, I will continue to take medications as prescribed and to keep appointments with PCP as scheduled.     . Patient Stated     08/29/2019, I will try to start a regular exercise regimen weekly.     . Patient Stated     09/07/2020, I will maintain and continue medications as prescribed. I walk a lot on my job also.       Depression Screen PHQ 2/9 Scores 09/07/2020 08/29/2019 08/02/2018 07/29/2017 07/20/2017 04/20/2017 07/07/2016  PHQ - 2 Score 0 0 0 0 0 0 0  PHQ- 9 Score 0 0 0 0 0 3 -    Fall Risk Fall Risk  09/07/2020 12/05/2019 08/29/2019 08/02/2018 07/20/2017  Falls in the past year? 0 1 1 0 Yes  Comment - - tripped in [employer] parking lot - pt fell after stepping backwards off of a stool; seen in ER  Number falls in past yr: 0 0 0 - 1  Injury with Fall? 0 1 0 - Yes  Risk for fall due to : Medication side effect History of fall(s) Medication side effect - -  Follow up Falls evaluation completed;Falls prevention discussed Falls evaluation completed Falls evaluation completed;Falls prevention discussed - -    FALL RISK PREVENTION PERTAINING TO  THE HOME:  Any stairs in or around the home? Yes  If so, are there any without handrails? No  Home free of loose throw rugs in walkways, pet beds, electrical cords, etc? Yes  Adequate lighting in your home to reduce risk of falls? Yes   ASSISTIVE DEVICES UTILIZED TO PREVENT FALLS:  Life alert? No  Use of a cane, walker or w/c? No  Grab bars in the bathroom? No  Shower chair or bench in shower? No  Elevated toilet seat or a handicapped toilet? No   TIMED UP AND GO:  Was the test performed? N/A telephone visit.    Cognitive Function: MMSE - Mini Mental State Exam 09/07/2020 08/29/2019 08/02/2018 07/20/2017 07/07/2016  Orientation to time 5 5 5 5 5   Orientation to Place 5 5 5 5 5   Registration 3 3 3 3 3   Attention/ Calculation 5 5 0 0 0  Recall 3 3 3 3 3   Language- name 2 objects - - 0 0 0  Language- repeat 1 1 1 1 1    Language- follow 3 step command - - 3 3 3   Language- read & follow direction - - 0 0 0  Write a sentence - - 0 0 0  Copy design - - 0 0 0  Total score - - 20 20 20   Mini Cog  Mini-Cog screen was completed. Maximum score is 22. A value of 0 denotes this part of the MMSE was not completed or the patient failed this part of the Mini-Cog screening.       Immunizations Immunization History  Administered Date(s) Administered  . Influenza, High Dose Seasonal PF 03/27/2017, 03/01/2018, 02/08/2019  . Influenza,inj,Quad PF,6+ Mos 03/21/2015, 03/25/2016  . Influenza-Unspecified 03/01/2018  . PFIZER(Purple Top)SARS-COV-2 Vaccination 07/11/2019, 08/02/2019, 03/22/2020  . Pneumococcal Conjugate-13 05/23/2014  . Pneumococcal Polysaccharide-23 07/02/2015  . Td 06/23/2006    TDAP status: Due, Education has been provided regarding the importance of this vaccine. Advised may receive this vaccine at local pharmacy or Health Dept. Aware to provide a copy of the vaccination record if obtained from local pharmacy or Health Dept. Verbalized acceptance and understanding.  Flu Vaccine status: Up to date  Pneumococcal vaccine status: Up to date  Covid-19 vaccine status: Completed vaccines  Qualifies for Shingles Vaccine? Yes   Zostavax completed No   Shingrix Completed?: No.    Education has been provided regarding the importance of this vaccine. Patient has been advised to call insurance company to determine out of pocket expense if they have not yet received this vaccine. Advised may also receive vaccine at local pharmacy or Health Dept. Verbalized acceptance and understanding.  Screening Tests Health Maintenance  Topic Date Due  . Hepatitis C Screening  Never done  . TETANUS/TDAP  06/22/2026 (Originally 06/23/2016)  . COVID-19 Vaccine (4 - Booster for Pfizer series) 09/19/2020  . COLONOSCOPY (Pts 45-30yrs Insurance coverage will need to be confirmed)  11/21/2024  . INFLUENZA VACCINE  Completed  .  DEXA SCAN  Completed  . PNA vac Low Risk Adult  Completed  . HPV VACCINES  Aged Out    Health Maintenance  Health Maintenance Due  Topic Date Due  . Hepatitis C Screening  Never done    Colorectal cancer screening: Type of screening: Colonoscopy. Completed 11/22/2019. Repeat every 5 years  Mammogram status: scheduled 10/2020 through Dr. Orvan Seen per patient   Bone Density status: Completed 07/28/2016. Results reflect: Bone density results: NORMAL. Repeat every 2-5 years.  Lung Cancer Screening: (Low  Dose CT Chest recommended if Age 43-80 years, 30 pack-year currently smoking OR have quit w/in 15years.) does not qualify  Additional Screening:  Hepatitis C Screening: does qualify; Completed due  Vision Screening: Recommended annual ophthalmology exams for early detection of glaucoma and other disorders of the eye. Is the patient up to date with their annual eye exam?  Yes  Who is the provider or what is the name of the office in which the patient attends annual eye exams? Dr. Karn Pickler, [facility] If pt is not established with a provider, would they like to be referred to a provider to establish care? No .   Dental Screening: Recommended annual dental exams for proper oral hygiene  Community Resource Referral / Chronic Care Management: CRR required this visit?  No   CCM required this visit?  No      Plan:     I have personally reviewed and noted the following in the patient's chart:   . Medical and social history . Use of alcohol, tobacco or illicit drugs  . Current medications and supplements . Functional ability and status . Nutritional status . Physical activity . Advanced directives . List of other physicians . Hospitalizations, surgeries, and ER visits in previous 12 months . Vitals . Screenings to include cognitive, depression, and falls . Referrals and appointments  In addition, I have reviewed and discussed with patient certain preventive protocols,  quality metrics, and best practice recommendations. A written personalized care plan for preventive services as well as general preventive health recommendations were provided to patient.   Due to this being a telephonic visit, the after visit summary with patients personalized plan was offered to patient via office or my-chart. Patient preferred to pick up at office at next visit or via mychart.   Andrez Grime, LPN   [id number]

## 2020-09-07 NOTE — Patient Instructions (Signed)
Elizabeth Hall , Thank you for taking time to come for your Medicare Wellness Visit. I appreciate your ongoing commitment to your health goals. Please review the following plan we discussed and let me know if I can assist you in the future.   Screening recommendations/referrals: Colonoscopy: Up to date, completed 11/22/2019, due 11/2024 Mammogram: scheduled 10/2020 through Dr. Orvan Seen  Bone Density: Up to date, completed 07/28/2016, due 2-5 years  Recommended yearly ophthalmology/optometry visit for glaucoma screening and checkup Recommended yearly dental visit for hygiene and checkup  Vaccinations: Influenza vaccine: Up to date, completed 03/19/2020, due 01/2021 Pneumococcal vaccine: Completed series Tdap vaccine: decline-insurance Shingles vaccine: due, check with your insurance regarding coverage if interested    Covid-19:Completed series  Advanced directives: copy in chart  Conditions/risks identified: dyslipidemia   Next appointment: Follow up in one year for your annual wellness visit    Preventive Care 21 Years and Older, Female Preventive care refers to lifestyle choices and visits with your health care provider that can promote health and wellness. What does preventive care include?  A yearly physical exam. This is also called an annual well check.  Dental exams once or twice a year.  Routine eye exams. Ask your health care provider how often you should have your eyes checked.  Personal lifestyle choices, including:  Daily care of your teeth and gums.  Regular physical activity.  Eating a healthy diet.  Avoiding tobacco and drug use.  Limiting alcohol use.  Practicing safe sex.  Taking low-dose aspirin every day.  Taking vitamin and mineral supplements as recommended by your health care provider. What happens during an annual well check? The services and screenings done by your health care provider during your annual well check will depend on your age, overall health,  lifestyle risk factors, and family history of disease. Counseling  Your health care provider may ask you questions about your:  Alcohol use.  Tobacco use.  Drug use.  Emotional well-being.  Home and relationship well-being.  Sexual activity.  Eating habits.  History of falls.  Memory and ability to understand (cognition).  Work and work Statistician.  Reproductive health. Screening  You may have the following tests or measurements:  Height, weight, and BMI.  Blood pressure.  Lipid and cholesterol levels. These may be checked every 5 years, or more frequently if you are over 98 years old.  Skin check.  Lung cancer screening. You may have this screening every year starting at age 72 if you have a 30-pack-year history of smoking and currently smoke or have quit within the past 15 years.  Fecal occult blood test (FOBT) of the stool. You may have this test every year starting at age 46.  Flexible sigmoidoscopy or colonoscopy. You may have a sigmoidoscopy every 5 years or a colonoscopy every 10 years starting at age 52.  Hepatitis C blood test.  Hepatitis B blood test.  Sexually transmitted disease (STD) testing.  Diabetes screening. This is done by checking your blood sugar (glucose) after you have not eaten for a while (fasting). You may have this done every 1-3 years.  Bone density scan. This is done to screen for osteoporosis. You may have this done starting at age 53.  Mammogram. This may be done every 1-2 years. Talk to your health care provider about how often you should have regular mammograms. Talk with your health care provider about your test results, treatment options, and if necessary, the need for more tests. Vaccines  Your health care  provider may recommend certain vaccines, such as:  Influenza vaccine. This is recommended every year.  Tetanus, diphtheria, and acellular pertussis (Tdap, Td) vaccine. You may need a Td booster every 10 years.  Zoster  vaccine. You may need this after age 67.  Pneumococcal 13-valent conjugate (PCV13) vaccine. One dose is recommended after age 17.  Pneumococcal polysaccharide (PPSV23) vaccine. One dose is recommended after age 67. Talk to your health care provider about which screenings and vaccines you need and how often you need them. This information is not intended to replace advice given to you by your health care provider. Make sure you discuss any questions you have with your health care provider. Document Released: 07/06/2015 Document Revised: 02/27/2016 Document Reviewed: 04/10/2015 Elsevier Interactive Patient Education  2017 [place] Prevention in the Home Falls can cause injuries. They can happen to people of all ages. There are many things you can do to make your home safe and to help prevent falls. What can I do on the outside of my home?  Regularly fix the edges of walkways and driveways and fix any cracks.  Remove anything that might make you trip as you walk through a door, such as a raised step or threshold.  Trim any bushes or trees on the path to your home.  Use bright outdoor lighting.  Clear any walking paths of anything that might make someone trip, such as rocks or tools.  Regularly check to see if handrails are loose or broken. Make sure that both sides of any steps have handrails.  Any raised decks and porches should have guardrails on the edges.  Have any leaves, snow, or ice cleared regularly.  Use sand or salt on walking paths during winter.  Clean up any spills in your garage right away. This includes oil or grease spills. What can I do in the bathroom?  Use night lights.  Install grab bars by the toilet and in the tub and shower. Do not use towel bars as grab bars.  Use non-skid mats or decals in the tub or shower.  If you need to sit down in the shower, use a plastic, non-slip stool.  Keep the floor dry. Clean up any water that spills on the  floor as soon as it happens.  Remove soap buildup in the tub or shower regularly.  Attach bath mats securely with double-sided non-slip rug tape.  Do not have throw rugs and other things on the floor that can make you trip. What can I do in the bedroom?  Use night lights.  Make sure that you have a light by your bed that is easy to reach.  Do not use any sheets or blankets that are too big for your bed. They should not hang down onto the floor.  Have a firm chair that has side arms. You can use this for support while you get dressed.  Do not have throw rugs and other things on the floor that can make you trip. What can I do in the kitchen?  Clean up any spills right away.  Avoid walking on wet floors.  Keep items that you use a lot in easy-to-reach places.  If you need to reach something above you, use a strong step stool that has a grab bar.  Keep electrical cords out of the way.  Do not use floor polish or wax that makes floors slippery. If you must use wax, use non-skid floor wax.  Do not have throw  rugs and other things on the floor that can make you trip. What can I do with my stairs?  Do not leave any items on the stairs.  Make sure that there are handrails on both sides of the stairs and use them. Fix handrails that are broken or loose. Make sure that handrails are as [place] as the stairways.  Check any carpeting to make sure that it is firmly attached to the stairs. Fix any carpet that is loose or worn.  Avoid having throw rugs at the top or bottom of the stairs. If you do have throw rugs, attach them to the floor with carpet tape.  Make sure that you have a light switch at the top of the stairs and the bottom of the stairs. If you do not have them, ask someone to add them for you. What else can I do to help prevent falls?  Wear shoes that:  Do not have high heels.  Have rubber bottoms.  Are comfortable and fit you well.  Are closed at the toe. Do not wear  sandals.  If you use a stepladder:  Make sure that it is fully opened. Do not climb a closed stepladder.  Make sure that both sides of the stepladder are locked into place.  Ask someone to hold it for you, if possible.  Clearly mark and make sure that you can see:  Any grab bars or handrails.  First and last steps.  Where the edge of each step is.  Use tools that help you move around (mobility aids) if they are needed. These include:  Canes.  Walkers.  Scooters.  Crutches.  Turn on the lights when you go into a dark area. Replace any light bulbs as soon as they burn out.  Set up your furniture so you have a clear path. Avoid moving your furniture around.  If any of your floors are uneven, fix them.  If there are any pets around you, be aware of where they are.  Review your medicines with your doctor. Some medicines can make you feel dizzy. This can increase your chance of falling. Ask your doctor what other things that you can do to help prevent falls. This information is not intended to replace advice given to you by your health care provider. Make sure you discuss any questions you have with your health care provider. Document Released: 04/05/2009 Document Revised: 11/15/2015 Document Reviewed: 07/14/2014 Elsevier Interactive Patient Education  2017 [employer].

## 2020-09-10 ENCOUNTER — Encounter: Payer: Self-pay | Admitting: Urology

## 2020-09-10 ENCOUNTER — Ambulatory Visit: Payer: Medicare Other | Admitting: Urology

## 2020-09-10 ENCOUNTER — Ambulatory Visit (INDEPENDENT_AMBULATORY_CARE_PROVIDER_SITE_OTHER): Payer: Medicare Other | Admitting: Urology

## 2020-09-10 ENCOUNTER — Other Ambulatory Visit: Payer: Self-pay

## 2020-09-10 VITALS — BP 126/72 | HR 76

## 2020-09-10 DIAGNOSIS — N3020 Other chronic cystitis without hematuria: Secondary | ICD-10-CM

## 2020-09-10 LAB — PARATHYROID HORMONE, INTACT (NO CA): PTH: 45 pg/mL (ref 16–77)

## 2020-09-10 MED ORDER — NITROFURANTOIN MACROCRYSTAL 100 MG PO CAPS: 100.0000 mg | ORAL_CAPSULE | Freq: Every day | ORAL | 11 refills | Status: DC

## 2020-09-10 NOTE — Progress Notes (Signed)
09/10/2020 2:07 PM   Blinda Turek Scheiber Aug 06, 1940 [id number]  Referring provider: Ria Bush, MD [place],  [place] [address]  No chief complaint on file.   HPI: I was consulted to assess the patient's chronic pelvic pain for more than 1 year. She gets suprapubic and perineal discomfort. She has failed 2 treatments for bladder infections as well as vaginal estrogen cream for possible vaginal dryness. She had a negative CT scan of her kidneys many months ago but did have diverticulitis. When this was treated it improve things temporarily.The x-ray actually read diverticulosis but not diverticulitis  She feels the discomfort and sometimes a pressure that comes and goes. She might feel it more when she sits but is not necessarily worse when she voids. Is not relieved when she voids.  She voids 2 or 3 times a day. She voids 4-6 times a night. She has mild ankle edema. She takes a diuretic.  She had a bladder suspension many years ago with a hysterectomy  Recently prescribed amitriptyline with some benefit but still has pain  Moderate atrophy  Patient has pelvic pain. She does not have frequency. She probably does not have interstitial cystitis. She will come back for safety cystoscopy. We will then discussed the role of a hydrodistention. The hydrodistention is negative I do not think she has interstitial cystitis.   We talked about a hydrodistention today but that we would need a safety cystoscopy prior no blood in urine.She was given 4 tablets 500 mg of amoxicillin to take 2 g of amoxicillin 30 minutes before cystoscopy for mechanical heart valve.  Patient reported that halfway through the urinary tract infection treatment her pain went away.  She felt much better.  The discomfort although milder started with pressure last night with increased frequency.  Cystoscopy: Normal  Based on last culture I called in Septra DS 1 tablet  twice a day for 7 days followed by daily Macrodantin.  I will see her on 100 mg of Macrodantin in about 7 weeks.  Call if urine culture differs  Today Frequency stable.  Last culture positive and treated Infection free and very pleased  PMH: Past Medical History:  Diagnosis Date   Bleeding of eye    Chronic systolic heart failure ([facility])    a. 11/2016 Echo: EF 35-40%, mild MR w/ nl fxning bioprosthesis, mod dil LA.   CKD (chronic kidney disease) stage 3, GFR 30-59 ml/min (HCC)    baseline Cr 1.5-2; prior saw nephrologist thought hypertensive nephropathy/renovascular disease   CVA (cerebral infarction)    a. 2014 occipital lobe - some vision loss when coumadin held and not bridged   Diverticulitis 06/2019   Dyslipidemia    Glaucoma    H. pylori infection    treated   H/O mitral valve replacement    a. 05/2012 s/p 106mm Carpentier Edwards pericardial tissue valve.   Hx of rheumatic fever    Hypertension    Melanoma ([facility]) 2006   L shoulder   NICM (nonischemic cardiomyopathy) ([facility])    a. 08/2013 s/p Biotronik Ilesto 7 HF1 BiV ICD (ser# [id number]);  b. 11/2016 Echo: EF 35-40%.   Non-obstructive CAD    a. s/p MI 2001;  b. 12/2016 Cath: LM 20ost, OM2 60, RCA 30p/d, EF 35-45%. Nl R heart filling pressures.   Permanent atrial fibrillation ([facility] [place])    a. 04/2012 s/p AVN & PPM placement (later upgraded to BiV ICD).   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 06/2012 h/o acute ischemia due to thromboembolism radial and ulnar arteries when coumadin held s/p atherectomy - needs bridging if off coumadin   Tricuspid regurgitation    a. 05/2012 s/p TV annuloplasty @ time of MVR.    Surgical History: Past Surgical History:  Procedure Laterality Date   ABDOMINAL HYSTERECTOMY     ABLATION  2013   ATHERECTOMY  06/2012   left arm after coumadin held without bridging   BREAST BIOPSY     CARDIOVASCULAR STRESS TEST  03/2016   WNL    CATARACT EXTRACTION Bilateral 2007, 2010   CHOLECYSTECTOMY     COLONOSCOPY  03/2014   diverticulosis, int hem, rpt 5 yrs for h/o polyps ([place])   COLONOSCOPY WITH PROPOFOL N/A 11/22/2019   diverticulosis, int hem, rpt 5 yrs [name], MD)   defibrillator     [employer] [phone] MRI 73 CM LEAD, SERIAL # [id number]   DEXA  07/2016   WNL   DG ABDOMEN COMPLETE ([facility])  02/2013   HH, mod severe erosive gastritis, duodenitis   ESOPHAGOGASTRODUODENOSCOPY (EGD) WITH PROPOFOL N/A 11/22/2019   mild reactive gastritis, biopsy neg for H pylori [name], [name], MD)   GALLBLADDER SURGERY  2006   INSERT / REPLACE / REMOVE PACEMAKER  08/2013   MITRAL VALVE REPLACEMENT  05/2012   RIGHT/LEFT HEART CATH AND CORONARY ANGIOGRAPHY N/A 12/29/2016   Procedure: Right/Left Heart Cath and Coronary Angiography;  Surgeon: [name], MD;  Location: [facility];  Service: Cardiovascular;  Laterality: N/A;   TOTAL ABDOMINAL HYSTERECTOMY W/ BILATERAL SALPINGOOPHORECTOMY  1991   irregular bleeding   TRICUSPID VALVE REPLACEMENT  05/2012   US ECHOCARDIOGRAPHY  02/2014   EF 40%, LA marked dilation, gen LV hypokinesis, mitral prosthetic valve    Home Medications:  Allergies as of 09/10/2020      Reactions   Phytonadione Other (See Comments)   Amiodarone Other (See Comments)   "Irritable"--IV INFUSION   Ciprofloxacin Rash   Rash while on biaxin, cipro, flagyl (unsure which was inciting agent)   Clarithromycin Rash   Rash while on biaxin, cipro, flagyl (unsure which was inciting agent)   Lisinopril Rash   Metronidazole Rash   Rash while on biaxin, cipro, flagyl (unsure which was inciting agent)   Phytonadione [vitamin K1] Other (See Comments)   "extremely faint"--IV INFUSION   Vitamin K2 Anxiety      Medication List       Accurate as of September 10, 2020  2:07 PM. If you have any questions, ask your nurse or doctor.        acetaminophen 500 MG tablet Commonly known as: TYLENOL Take  500-1,000 mg by mouth every 6 (six) hours as needed (for headaches/pain.).   amitriptyline 10 MG tablet Commonly known as: ELAVIL Take 2 tablets (20 mg total) by mouth at bedtime.   amoxicillin 500 MG capsule Commonly known as: AMOXIL Patient will take 4 capsules 1/2 hour prior to procedure   aspirin EC 81 MG tablet Take 81 mg by mouth daily.   bumetanide 0.5 MG tablet Commonly known as: BUMEX Take 1 tablet (0.5 mg total) by mouth every other day.   carvedilol 12.5 MG tablet Commonly known as: COREG Take 1 tablet (12.5 mg total) by mouth 2 (two) times daily with a meal.   Lumigan 0.01 % Soln Generic drug: bimatoprost Place 1 drop into both eyes at bedtime.   Magnesium 250 MG Tabs  Take 250 mg by mouth daily.   meclizine 25 MG tablet Commonly known as: ANTIVERT TAKE 1 TABLET TWICE A DAY  AS NEEDED FOR DIZZINESS   nitrofurantoin 100 MG capsule Commonly known as: Macrodantin Take 1 capsule (100 mg total) by mouth daily.   Polyethyl Glycol-Propyl Glycol 0.4-0.3 % Soln Place 1 drop into both eyes 3 (three) times daily as needed (for dry eyes.).   sacubitril-valsartan 49-51 MG Commonly known as: ENTRESTO Take 1 tablet (49/51 mg) by mouth twice daily   spironolactone 25 MG tablet Commonly known as: ALDACTONE TAKE 1/2 TABLET (12.5MG  TOTAL) DAILY   VITAMIN B COMPLEX PO Take by mouth daily.   Vitamin D 50 MCG (2000 UT) Caps Take 1 capsule (2,000 Units total) by mouth daily.   vitamin E 180 MG (400 UNITS) capsule Take 400 Units by mouth daily.   warfarin 3 MG tablet Commonly known as: Jantoven Take as directed by the anticoagulation clinic. If you are unsure how to take this medication, talk to your nurse or doctor. Original instructions: TAKE 1 TO 2 TABLETS DAILY  AS DIRECTED BY THE COUMADINCLINIC   ZYRTEC PO Take by mouth as needed.       Allergies:  Allergies  Allergen Reactions   Phytonadione Other (See Comments)   Amiodarone Other (See Comments)     "Irritable"--IV INFUSION    Ciprofloxacin Rash    Rash while on biaxin, cipro, flagyl (unsure which was inciting agent)   Clarithromycin Rash    Rash while on biaxin, cipro, flagyl (unsure which was inciting agent)   Lisinopril Rash   Metronidazole Rash    Rash while on biaxin, cipro, flagyl (unsure which was inciting agent)   Phytonadione [Vitamin K1] Other (See Comments)    "extremely faint"--IV INFUSION   Vitamin K2 Anxiety    Family History: Family History  Problem Relation Age of Onset   CAD Father 67       MI   Hypertension Brother    Heart failure Brother        CHF with pacemaker   Stroke Brother    Heart failure Sister        CHF   Stroke Sister    Diabetes Cousin    Cancer Maternal Aunt        colon   Cancer Cousin        breast   Cancer Cousin        brain tumor   Basal cell carcinoma Daughter     Social History:  reports that she quit smoking about 30 years ago. Her smoking use included cigarettes. She has a 10.00 pack-year smoking history. She has never used smokeless tobacco. She reports current alcohol use. She reports that she does not use drugs.  ROS:                                        Physical Exam: LMP  (LMP Unknown) Comment: menopause age 50  Constitutional:  Alert and oriented, No acute distress.  Laboratory Data: Lab Results  Component Value Date   WBC 5.3 09/07/2020   HGB 10.3 (L) 09/07/2020   HCT 30.6 (L) 09/07/2020   MCV 83.7 09/07/2020   PLT 258.0 09/07/2020    Lab Results  Component Value Date   CREATININE 1.05 09/07/2020    No results found for: PSA  No results found for: TESTOSTERONE  Lab Results  Component Value Date   HGBA1C 6.0 09/07/2020    Urinalysis    Component Value Date/Time   COLORURINE Yellow 09/13/2014 1620   APPEARANCEUR Clear 07/02/2020 0901   LABSPEC 1.014 09/13/2014 1620   PHURINE 6.0 09/13/2014 1620   GLUCOSEU Negative 07/02/2020 0901   GLUCOSEU  Negative 09/13/2014 1620   HGBUR Negative 09/13/2014 1620   BILIRUBINUR Negative 07/02/2020 0901   BILIRUBINUR Negative 09/13/2014 1620   KETONESUR Negative 09/13/2014 1620   PROTEINUR Negative 07/02/2020 0901   PROTEINUR Negative 09/13/2014 1620   UROBILINOGEN 0.2 04/16/2020 1231   NITRITE Negative 07/02/2020 0901   NITRITE Negative 09/13/2014 1620   LEUKOCYTESUR Trace (A) 07/02/2020 0901   LEUKOCYTESUR Negative 09/13/2014 1620    Pertinent Imaging:   Assessment & Plan: Moving back to [place].  Very pleased on Macrodantin.  See as needed  There are no diagnoses linked to this encounter.  No follow-ups on file.  Reece Packer, MD  [facility] [address] [place], [place] [address] [phone]

## 2020-09-10 NOTE — Addendum Note (Signed)
Addended by: Verlene Mayer A on: 09/10/2020 02:31 PM   Modules accepted: Orders

## 2020-09-13 ENCOUNTER — Encounter: Payer: Medicare Other | Admitting: Family Medicine

## 2020-09-13 ENCOUNTER — Other Ambulatory Visit: Payer: Self-pay

## 2020-09-13 MED ORDER — NITROFURANTOIN MACROCRYSTAL 100 MG PO CAPS: 100.0000 mg | ORAL_CAPSULE | Freq: Every day | ORAL | 11 refills | Status: AC

## 2020-09-14 ENCOUNTER — Encounter: Payer: Self-pay | Admitting: Family Medicine

## 2020-09-14 ENCOUNTER — Other Ambulatory Visit: Payer: Self-pay

## 2020-09-14 ENCOUNTER — Ambulatory Visit (INDEPENDENT_AMBULATORY_CARE_PROVIDER_SITE_OTHER): Payer: Medicare Other | Admitting: Family Medicine

## 2020-09-14 VITALS — BP 130/74 | HR 78 | Temp 97.7°F | Ht 62.0 in | Wt 134.4 lb

## 2020-09-14 DIAGNOSIS — E785 Hyperlipidemia, unspecified: Secondary | ICD-10-CM

## 2020-09-14 DIAGNOSIS — R6889 Other general symptoms and signs: Secondary | ICD-10-CM | POA: Diagnosis not present

## 2020-09-14 DIAGNOSIS — R0609 Other forms of dyspnea: Secondary | ICD-10-CM

## 2020-09-14 DIAGNOSIS — Z8673 Personal history of transient ischemic attack (TIA), and cerebral infarction without residual deficits: Secondary | ICD-10-CM

## 2020-09-14 DIAGNOSIS — R7303 Prediabetes: Secondary | ICD-10-CM

## 2020-09-14 DIAGNOSIS — I4821 Permanent atrial fibrillation: Secondary | ICD-10-CM

## 2020-09-14 DIAGNOSIS — N1831 Chronic kidney disease, stage 3a: Secondary | ICD-10-CM | POA: Diagnosis not present

## 2020-09-14 DIAGNOSIS — N3020 Other chronic cystitis without hematuria: Secondary | ICD-10-CM

## 2020-09-14 DIAGNOSIS — Z950 Presence of cardiac pacemaker: Secondary | ICD-10-CM

## 2020-09-14 DIAGNOSIS — H409 Unspecified glaucoma: Secondary | ICD-10-CM

## 2020-09-14 DIAGNOSIS — I5022 Chronic systolic (congestive) heart failure: Secondary | ICD-10-CM

## 2020-09-14 DIAGNOSIS — Z7189 Other specified counseling: Secondary | ICD-10-CM | POA: Diagnosis not present

## 2020-09-14 DIAGNOSIS — Z7901 Long term (current) use of anticoagulants: Secondary | ICD-10-CM

## 2020-09-14 DIAGNOSIS — Z953 Presence of xenogenic heart valve: Secondary | ICD-10-CM

## 2020-09-14 DIAGNOSIS — D649 Anemia, unspecified: Secondary | ICD-10-CM

## 2020-09-14 DIAGNOSIS — R0600 Dyspnea, unspecified: Secondary | ICD-10-CM

## 2020-09-14 DIAGNOSIS — I10 Essential (primary) hypertension: Secondary | ICD-10-CM | POA: Diagnosis not present

## 2020-09-14 DIAGNOSIS — Z85820 Personal history of malignant melanoma of skin: Secondary | ICD-10-CM

## 2020-09-14 DIAGNOSIS — D509 Iron deficiency anemia, unspecified: Secondary | ICD-10-CM

## 2020-09-14 NOTE — Progress Notes (Signed)
Patient ID: Elizabeth Hall, female    DOB: 01/05/1941, 80 y.o.   MRN: [id number]  This visit was conducted in person.  BP 130/74   Pulse 78   Temp 97.7 F (36.5 C) (Temporal)   Ht 5\' 2"  (1.575 m)   Wt 134 lb 7 oz (61 kg)   LMP  (LMP Unknown) Comment: menopause age 60  SpO2 98%   BMI 24.59 kg/m    CC: CPE Subjective:   HPI: Elizabeth Hall is a 80 y.o. female presenting on 09/14/2020 for Annual Exam (Prt 2. )   Saw health advisor last week  for medicare wellness visit. Note reviewed.   No exam data present  Flowsheet Row Clinical Support from 09/07/2020 in [facility] at [facility]  PHQ-2 Total Score 0      Fall Risk  09/07/2020 12/05/2019 08/29/2019 08/02/2018 07/20/2017  Falls in the past year? 0 1 1 0 Yes  Comment - - tripped in [employer] parking lot - pt fell after stepping backwards off of a stool; seen in ER  Number falls in past yr: 0 0 0 - 1  Injury with Fall? 0 1 0 - Yes  Risk for fall due to : Medication side effect History of fall(s) Medication side effect - -  Follow up Falls evaluation completed;Falls prevention discussed Falls evaluation completed Falls evaluation completed;Falls prevention discussed - -    Planning to return to [facility] [place] later this year with her family - planning to move into her own apartment.   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  H/o mitral valve vegetation s/p MVR (43mm Carpentier Edwards pericardial bioprosthetic valve) and tricupsid valve annuloplasty 05/2012.  Known Afib s/p AV ablation, PPM placement 04/2012, on coumadin - currently on 3mg  daily, 6mg  on Mon/Fri.  H/o CVA after warfarin was held - needs bridging with lovenox if coumadin hold needed.   Recurrent lower abd pain in setting of recurrent diverticulitis - s/p augmentin course 02/2020. Saw urology ?chronic cystitis - started on daily macrodantin 100mg  with marked benefit. She had reassuring cystoscopy 06/2020.   Preventative: COLONOSCOPY  WITH PROPOFOL 11/22/2019 - diverticulosis, int hem, rpt 5 yrs [name], [name], MD) EGD 11/22/2019 - small HH, gastritis , neg H pylori ([place]) Well woman -established with Dr Kathryne Eriksson. Sees in [place] Mammogram Nov 15, 2019 - normal at [facility] office. appt already scheduled.  DEXA -normal 2018. Lung cancer screening - not eligible . Yearly flu shot [place] 06/2019, 07/2019, booster 02/2020 Tetanus - 2008 Pneumovax2017, prevnar 05/2014  Shingrix - discussed - declines  Advanced directive -Scanned 08/2015. Daughter [name] is HCPOA. Declines prolonged life support if terminal condition.[place] with temporary life support for reversible condition.POST form completed and in chart.  Seat belt use discussed. Sunscreen use discussed. No changing moles on skin. Personal h/o melanoma. Sees derm regularly (DrTaraStewart)Qyearly. Ex smoker  Alcohol - occasional wine  Dentist tries yearly - full dentures  Eye exam yearly ([facility]). H/o L vision loss from CVA and glaucoma - sees Q4 months.  Bowel - no constipation  Bladder - no incontinence   Widower - husband [name] passed away from MM cancer Nov 15, 2019  Occupation: retired Therapist, sports (2007), worked for [employer] Edu: nursing school Activity: keeps 49 yo grandson Diet: good water, fruits/vegetables     Relevant past medical, surgical, family and social history reviewed and updated as indicated. Interim medical history since our last visit reviewed. Allergies and medications reviewed and updated. Outpatient  Medications Prior to Visit  Medication Sig Dispense Refill  . acetaminophen (TYLENOL) 500 MG tablet Take 500-1,000 mg by mouth every 6 (six) hours as needed (for headaches/pain.).    [name] amitriptyline (ELAVIL) 10 MG tablet Take 2 tablets (20 mg total) by mouth at bedtime. 180 tablet 1  . aspirin EC 81 MG tablet Take 81 mg by mouth daily.    . B Complex Vitamins (VITAMIN B COMPLEX PO) Take by mouth daily.    . bumetanide (BUMEX) 0.5 MG tablet Take 1  tablet (0.5 mg total) by mouth every other day. 90 tablet 0  . carvedilol (COREG) 12.5 MG tablet Take 1 tablet (12.5 mg total) by mouth 2 (two) times daily with a meal. 180 tablet 2  . Cetirizine HCl (ZYRTEC PO) Take by mouth as needed.    . Cholecalciferol (VITAMIN D) 50 MCG (2000 UT) CAPS Take 1 capsule (2,000 Units total) by mouth daily. 30 capsule   . LUMIGAN 0.01 % SOLN Place 1 drop into both eyes at bedtime.     . Magnesium 250 MG TABS Take 250 mg by mouth daily.    . meclizine (ANTIVERT) 25 MG tablet TAKE 1 TABLET TWICE A DAY  AS NEEDED FOR DIZZINESS 180 tablet 1  . nitrofurantoin (MACRODANTIN) 100 MG capsule Take 1 capsule (100 mg total) by mouth daily. 30 capsule 11  . Polyethyl Glycol-Propyl Glycol 0.4-0.3 % SOLN Place 1 drop into both eyes 3 (three) times daily as needed (for dry eyes.).    [name] sacubitril-valsartan (ENTRESTO) 49-51 MG Take 1 tablet (49/51 mg) by mouth twice daily 180 tablet 3  . spironolactone (ALDACTONE) 25 MG tablet TAKE 1/2 TABLET (12.5MG  TOTAL) DAILY 45 tablet 2  . vitamin E 400 UNIT capsule Take 400 Units by mouth daily.    [name] warfarin (JANTOVEN) 3 MG tablet TAKE 1 TO 2 TABLETS DAILY  AS DIRECTED BY THE COUMADINCLINIC 120 tablet 1   No facility-administered medications prior to visit.     Per HPI unless specifically indicated in ROS section below Review of Systems  Constitutional: Negative for activity change, appetite change, chills, fatigue, fever and unexpected weight change.  HENT: Negative for hearing loss.   Eyes: Negative for visual disturbance.  Respiratory: Positive for shortness of breath (chronic exertional). Negative for cough, chest tightness and wheezing.   Cardiovascular: Positive for leg swelling. Negative for chest pain and palpitations.  Gastrointestinal: Negative for abdominal distention, abdominal pain, blood in stool, constipation, diarrhea, nausea and vomiting.  Genitourinary: Negative for difficulty urinating and hematuria.   Musculoskeletal: Negative for arthralgias, myalgias and neck pain.  Skin: Negative for rash.  Neurological: Negative for dizziness, seizures, syncope and headaches.  Hematological: Negative for adenopathy. Bruises/bleeds easily (mild, on coumadin).  Psychiatric/Behavioral: Negative for dysphoric mood. The patient is not nervous/anxious.    Objective:  BP 130/74   Pulse 78   Temp 97.7 F (36.5 C) (Temporal)   Ht 5\' 2"  (1.575 m)   Wt 134 lb 7 oz (61 kg)   LMP  (LMP Unknown) Comment: menopause age 29  SpO2 98%   BMI 24.59 kg/m   Wt Readings from Last 3 Encounters:  09/14/20 134 lb 7 oz (61 kg)  06/05/20 142 lb (64.4 kg)  04/16/20 136 lb 8 oz (61.9 kg)      Physical Exam Vitals and nursing note reviewed.  Constitutional:      General: She is not in acute distress.    Appearance: Normal appearance. She is well-developed. She is  not ill-appearing.  HENT:     Head: Normocephalic and atraumatic.     Right Ear: Hearing, tympanic membrane, ear canal and external ear normal.     Left Ear: Hearing, tympanic membrane, ear canal and external ear normal.     Mouth/Throat:     Pharynx: Uvula midline.  Eyes:     General: No scleral icterus.    Extraocular Movements: Extraocular movements intact.     Conjunctiva/sclera: Conjunctivae normal.     Pupils: Pupils are equal, round, and reactive to light.  Cardiovascular:     Rate and Rhythm: Normal rate and regular rhythm.     Pulses: Normal pulses.          Radial pulses are 2+ on the right side and 2+ on the left side.     Heart sounds: Murmur (systolic) heard.      Comments: Sounds regular Pulmonary:     Effort: Pulmonary effort is normal. No respiratory distress.     Breath sounds: Normal breath sounds. No wheezing, rhonchi or rales.  Abdominal:     General: Abdomen is flat. Bowel sounds are normal. There is no distension.     Palpations: Abdomen is soft. There is no mass.     Tenderness: There is no abdominal tenderness. There is  no guarding or rebound.     Hernia: No hernia is present.  Musculoskeletal:        General: Normal range of motion.     Cervical back: Normal range of motion and neck supple.     Right lower leg: No edema.     Left lower leg: No edema.  Lymphadenopathy:     Cervical: No cervical adenopathy.  Skin:    General: Skin is warm and dry.     Findings: No rash.  Neurological:     General: No focal deficit present.     Mental Status: She is alert and oriented to person, place, and time.     Comments: CN grossly intact, station and gait intact  Psychiatric:        Mood and Affect: Mood normal.        Behavior: Behavior normal.        Thought Content: Thought content normal.        Judgment: Judgment normal.       Results for orders placed or performed in visit on 09/14/20  Vitamin B12  Result Value Ref Range   Vitamin B-12 673 200 - 1,100 pg/mL  Ferritin  Result Value Ref Range   Ferritin 14 (L) 16 - 288 ng/mL  Folate  Result Value Ref Range   Folate >24.0 ng/mL  CBC with Differential/Platelet  Result Value Ref Range   WBC 6.6 3.8 - 10.8 Thousand/uL   RBC 3.42 (L) 3.80 - 5.10 Million/uL   Hemoglobin 9.3 (L) 11.7 - 15.5 g/dL   HCT 28.8 (L) 35.0 - 45.0 %   MCV 84.2 80.0 - 100.0 fL   MCH 27.2 27.0 - 33.0 pg   MCHC 32.3 32.0 - 36.0 g/dL   RDW 14.8 11.0 - 15.0 %   Platelets 278 140 - 400 Thousand/uL   MPV 10.3 7.5 - 12.5 fL   Neutro Abs 3,518 1,500 - 7,800 cells/uL   Lymphs Abs 1,980 850 - 3,900 cells/uL   Absolute Monocytes 838 200 - 950 cells/uL   Eosinophils Absolute 191 15 - 500 cells/uL   Basophils Absolute 73 0 - 200 cells/uL   Neutrophils Relative % 53.3 %  Total Lymphocyte 30.0 %   Monocytes Relative 12.7 %   Eosinophils Relative 2.9 %   Basophils Relative 1.1 %  Iron,Total/Total Iron Binding Cap  Result Value Ref Range   Iron 34 (L) 45 - 160 mcg/dL   TIBC 423 250 - 450 mcg/dL (calc)   %SAT 8 (L) 16 - 45 % (calc)  Transferrin  Result Value Ref Range    Transferrin 332 188 - 341 mg/dL   Lab Results  Component Value Date   CHOL 137 09/07/2020   HDL 61.30 09/07/2020   LDLCALC 67 09/07/2020   TRIG 44.0 09/07/2020   CHOLHDL 2 09/07/2020    Lab Results  Component Value Date   CREATININE 1.05 09/07/2020   BUN 28 (H) 09/07/2020   NA 136 09/07/2020   K 4.5 09/07/2020   CL 104 09/07/2020   CO2 25 09/07/2020    Lab Results  Component Value Date   HGBA1C 6.0 09/07/2020    Assessment & Plan:  This visit occurred during the SARS-CoV-2 public health emergency.  Safety protocols were in place, including screening questions prior to the visit, additional usage of staff PPE, and extensive cleaning of exam room while observing appropriate contact time as indicated for disinfecting solutions.   Problem List Items Addressed This Visit    Atrial fibrillation ([place])    Chronic, stable on coumadin. Sounds regular today.       Chronic systolic heart failure (HCC)    Followed by cardiology on entresto, bumex and spironolactone. Appreciate cards care.       Status post mitral valve replacement with tissue valve   Status post biventricular pacemaker   Dyslipidemia    Chronic, LDL 67 off statin. The 10-year ASCVD risk score [name], et al., 2013) is: 29.7%   Values used to calculate the score:     Age: 59 years     Sex: Female     Is Non-Hispanic African American: No     Diabetic: No     Tobacco smoker: No     Systolic Blood Pressure: 712 mmHg     Is BP treated: Yes     HDL Cholesterol: 61.3 mg/dL     Total Cholesterol: 137 mg/dL       Hypertension    Chronic, stable on current regimen - continue.       Glaucoma    Regularly sees ophtho - Dr George Ina      History of cerebrovascular accident (CVA) in adulthood    H/o CVA to occipital lobe with residual some vision loss - needs lovenox bridging if coumadin held.       CKD (chronic kidney disease) stage 3, GFR 30-59 ml/min (HCC)    Stable period these last few years. GFR  remaining 50s Previously saw nephrology in [place].  Encouraged good water intake.       Prediabetes    Encouraged limiting added sugars in diet.       Advanced care planning/counseling discussion - Primary    Advanced directive -Scanned 08/2015. Daughter [name] is HCPOA. Declines prolonged life support if terminal condition.[place] with temporary life support for reversible condition.POST form completed and in chart.       History of melanoma    Sees derm yearly.       Exertional dyspnea    Chronic.       Chronic cystitis    Saw urology - doing well on daily macrodantin. Reviewed monitoring for periph neuropathy, lung toxicity, liver disease.  Anticoagulant [place]-term use   Iron deficiency anemia    Progressive anemia noted - anticipate multifactorial including poor dietary iron intake, CKD, and possibly chronic GI blood loss in h/o coumadin use. Check anemia panel today, check iFOB. Did have reassuring EGD/colonoscopy (11/2019). Consider return to GI.        Other Visit Diagnoses    Other general symptoms and signs        Relevant Orders   Vitamin B12 (Completed)   Folate (Completed)       No orders of the defined types were placed in this encounter.  Orders Placed This Encounter  Procedures  . Vitamin B12  . Ferritin  . Folate  . Pathologist smear review  . CBC with Differential/Platelet  . Iron,Total/Total Iron Binding Cap  . Transferrin    Patient instructions: Labs today to further evaluate progressive anemia - I suspect component of iron deficiency.  Consider shingrix vaccine at pharmacy.  Good to see you today. Best of luck in your upcoming move! Let us know where to send records.   Follow up plan: Return if symptoms worsen or fail to improve.  Ria Bush, MD

## 2020-09-14 NOTE — Patient Instructions (Addendum)
Labs today to further evaluate progressive anemia - I suspect component of iron deficiency.  Consider shingrix vaccine at pharmacy.  Good to see you today. Best of luck in your upcoming move! Let us know where to send records.   Health Maintenance After Age 79 After age 65, you are at a higher risk for certain [place]-term diseases and infections as well as injuries from falls. Falls are a major cause of broken bones and head injuries in people who are older than age 35. Getting regular preventive care can help to keep you healthy and well. Preventive care includes getting regular testing and making lifestyle changes as recommended by your health care provider. Talk with your health care provider about:  Which screenings and tests you should have. A screening is a test that checks for a disease when you have no symptoms.  A diet and exercise plan that is right for you. What should I know about screenings and tests to prevent falls? Screening and testing are the best ways to find a health problem early. Early diagnosis and treatment give you the best chance of managing medical conditions that are common after age 72. Certain conditions and lifestyle choices may make you more likely to have a fall. Your health care provider may recommend:  Regular vision checks. Poor vision and conditions such as cataracts can make you more likely to have a fall. If you wear glasses, make sure to get your prescription updated if your vision changes.  Medicine review. Work with your health care provider to regularly review all of the medicines you are taking, including over-the-counter medicines. Ask your health care provider about any side effects that may make you more likely to have a fall. Tell your health care provider if any medicines that you take make you feel dizzy or sleepy.  Osteoporosis screening. Osteoporosis is a condition that causes the bones to get weaker. This can make the bones weak and cause them to  break more easily.  Blood pressure screening. Blood pressure changes and medicines to control blood pressure can make you feel dizzy.  Strength and balance checks. Your health care provider may recommend certain tests to check your strength and balance while standing, walking, or changing positions.  Foot health exam. Foot pain and numbness, as well as not wearing proper footwear, can make you more likely to have a fall.  Depression screening. You may be more likely to have a fall if you have a fear of falling, feel emotionally low, or feel unable to do activities that you used to do.  Alcohol use screening. Using too much alcohol can affect your balance and may make you more likely to have a fall. What actions can I take to lower my risk of falls? General instructions  Talk with your health care provider about your risks for falling. Tell your health care provider if: ? You fall. Be sure to tell your health care provider about all falls, even ones that seem minor. ? You feel dizzy, sleepy, or off-balance.  Take over-the-counter and prescription medicines only as told by your health care provider. These include any supplements.  Eat a healthy diet and maintain a healthy weight. A healthy diet includes low-fat dairy products, low-fat (lean) meats, and fiber from whole grains, beans, and lots of fruits and vegetables. Home safety  Remove any tripping hazards, such as rugs, cords, and clutter.  Install safety equipment such as grab bars in bathrooms and safety rails on stairs.  Keep  rooms and walkways well-lit. Activity  Follow a regular exercise program to stay fit. This will help you maintain your balance. Ask your health care provider what types of exercise are appropriate for you.  If you need a cane or walker, use it as recommended by your health care provider.  Wear supportive shoes that have nonskid soles.   Lifestyle  Do not drink alcohol if your health care provider tells  you not to drink.  If you drink alcohol, limit how much you have: ? 0-1 drink a day for women. ? 0-2 drinks a day for men.  Be aware of how much alcohol is in your drink. In the [place] one drink equals one typical bottle of beer (12 oz), one-half glass of wine (5 oz), or one shot of hard liquor (1 oz).  Do not use any products that contain nicotine or tobacco, such as cigarettes and e-cigarettes. If you need help quitting, ask your health care provider. Summary  Having a healthy lifestyle and getting preventive care can help to protect your health and wellness after age 9.  Screening and testing are the best way to find a health problem early and help you avoid having a fall. Early diagnosis and treatment give you the best chance for managing medical conditions that are more common for people who are older than age 26.  Falls are a major cause of broken bones and head injuries in people who are older than age 41. Take precautions to prevent a fall at home.  Work with your health care provider to learn what changes you can make to improve your health and wellness and to prevent falls. This information is not intended to replace advice given to you by your health care provider. Make sure you discuss any questions you have with your health care provider. Document Revised: 09/30/2018 Document Reviewed: 04/22/2017 Elsevier Patient Education  2021 [employer].

## 2020-09-14 NOTE — Assessment & Plan Note (Addendum)
Advanced directive -Scanned 08/2015. Daughter [name] is HCPOA. Declines prolonged life support if terminal condition.[place] with temporary life support for reversible condition.POST form completed and in chart.

## 2020-09-15 NOTE — Assessment & Plan Note (Signed)
Chronic, stable on coumadin. Sounds regular today.

## 2020-09-15 NOTE — Assessment & Plan Note (Signed)
Saw urology - doing well on daily macrodantin. Reviewed monitoring for periph neuropathy, lung toxicity, liver disease.

## 2020-09-15 NOTE — Assessment & Plan Note (Signed)
Chronic. 

## 2020-09-15 NOTE — Assessment & Plan Note (Signed)
Chronic, stable on current regimen - continue. 

## 2020-09-15 NOTE — Assessment & Plan Note (Signed)
Sees derm yearly

## 2020-09-15 NOTE — Assessment & Plan Note (Addendum)
H/o CVA to occipital lobe with residual some vision loss - needs lovenox bridging if coumadin held.

## 2020-09-15 NOTE — Assessment & Plan Note (Signed)
Regularly sees ophtho - Dr George Ina

## 2020-09-15 NOTE — Assessment & Plan Note (Signed)
Followed by cardiology on entresto, bumex and spironolactone. Appreciate cards care.

## 2020-09-15 NOTE — Assessment & Plan Note (Signed)
Progressive anemia noted - anticipate multifactorial including poor dietary iron intake, CKD, and possibly chronic GI blood loss in h/o coumadin use. Check anemia panel today, check iFOB. Did have reassuring EGD/colonoscopy (11/2019). Consider return to GI.

## 2020-09-15 NOTE — Assessment & Plan Note (Addendum)
Stable period these last few years. GFR remaining 50s Previously saw nephrology in [place].  Encouraged good water intake.

## 2020-09-15 NOTE — Assessment & Plan Note (Addendum)
Chronic, LDL 67 off statin. The 10-year ASCVD risk score [name], et al., 2013) is: 29.7%   Values used to calculate the score:     Age: 80 years     Sex: Female     Is Non-Hispanic African American: No     Diabetic: No     Tobacco smoker: No     Systolic Blood Pressure: 159 mmHg     Is BP treated: Yes     HDL Cholesterol: 61.3 mg/dL     Total Cholesterol: 137 mg/dL

## 2020-09-15 NOTE — Assessment & Plan Note (Signed)
Encouraged limiting added sugars in diet.  

## 2020-09-16 ENCOUNTER — Encounter: Payer: Self-pay | Admitting: Family Medicine

## 2020-09-17 LAB — CBC WITH DIFFERENTIAL/PLATELET
Absolute Monocytes: 838 {cells}/uL (ref 200–950)
Basophils Absolute: 73 {cells}/uL (ref 0–200)
Basophils Relative: 1.1 %
Eosinophils Absolute: 191 {cells}/uL (ref 15–500)
Eosinophils Relative: 2.9 %
HCT: 28.8 % — ABNORMAL LOW (ref 35.0–45.0)
Hemoglobin: 9.3 g/dL — ABNORMAL LOW (ref 11.7–15.5)
Lymphs Abs: 1980 {cells}/uL (ref 850–3900)
MCH: 27.2 pg (ref 27.0–33.0)
MCHC: 32.3 g/dL (ref 32.0–36.0)
MCV: 84.2 fL (ref 80.0–100.0)
MPV: 10.3 fL (ref 7.5–12.5)
Monocytes Relative: 12.7 %
Neutro Abs: 3518 {cells}/uL (ref 1500–7800)
Neutrophils Relative %: 53.3 %
Platelets: 278 Thousand/uL (ref 140–400)
RBC: 3.42 Million/uL — ABNORMAL LOW (ref 3.80–5.10)
RDW: 14.8 % (ref 11.0–15.0)
Total Lymphocyte: 30 %
WBC: 6.6 Thousand/uL (ref 3.8–10.8)

## 2020-09-17 LAB — IRON, TOTAL/TOTAL IRON BINDING CAP
%SAT: 8 % — ABNORMAL LOW (ref 16–45)
Iron: 34 ug/dL — ABNORMAL LOW (ref 45–160)
TIBC: 423 ug/dL (ref 250–450)

## 2020-09-17 LAB — VITAMIN B12: Vitamin B-12: 673 pg/mL (ref 200–1100)

## 2020-09-17 LAB — TRANSFERRIN: Transferrin: 332 mg/dL (ref 188–341)

## 2020-09-17 LAB — FERRITIN: Ferritin: 14 ng/mL — ABNORMAL LOW (ref 16–288)

## 2020-09-17 LAB — FOLATE: Folate: >24 ng/mL

## 2020-09-17 LAB — PATHOLOGIST SMEAR REVIEW

## 2020-09-17 NOTE — Telephone Encounter (Signed)
Patient is requesting that we send her referral to [facility] rather than [facility]. Patient is requesting a call back to discuss. EM

## 2020-09-18 ENCOUNTER — Other Ambulatory Visit (INDEPENDENT_AMBULATORY_CARE_PROVIDER_SITE_OTHER): Payer: Medicare Other

## 2020-09-18 DIAGNOSIS — Z1211 Encounter for screening for malignant neoplasm of colon: Secondary | ICD-10-CM

## 2020-09-18 NOTE — Telephone Encounter (Signed)
Spoke with patient. She will discuss options with family, we will touch base when results of iFOB return (she just dropped off)

## 2020-09-19 ENCOUNTER — Other Ambulatory Visit: Payer: Self-pay | Admitting: Family Medicine

## 2020-09-19 DIAGNOSIS — Z1211 Encounter for screening for malignant neoplasm of colon: Secondary | ICD-10-CM

## 2020-09-19 LAB — FECAL OCCULT BLOOD, IMMUNOCHEMICAL: Fecal Occult Bld: NEGATIVE

## 2020-09-20 ENCOUNTER — Encounter: Payer: Self-pay | Admitting: Family Medicine

## 2020-09-20 DIAGNOSIS — D509 Iron deficiency anemia, unspecified: Secondary | ICD-10-CM

## 2020-09-20 NOTE — Telephone Encounter (Signed)
plz schedule lab visit for tomorrow

## 2020-09-20 NOTE — Telephone Encounter (Signed)
Pt has lab visit scheduled for tomorrow at 10:15.

## 2020-09-21 ENCOUNTER — Other Ambulatory Visit: Payer: Self-pay

## 2020-09-21 ENCOUNTER — Other Ambulatory Visit (INDEPENDENT_AMBULATORY_CARE_PROVIDER_SITE_OTHER): Payer: Medicare Other

## 2020-09-21 DIAGNOSIS — D509 Iron deficiency anemia, unspecified: Secondary | ICD-10-CM

## 2020-09-21 LAB — CBC WITH DIFFERENTIAL/PLATELET
Basophils Absolute: 0.1 10*3/uL (ref 0.0–0.1)
Basophils Relative: 1.3 % (ref 0.0–3.0)
Eosinophils Absolute: 0.2 10*3/uL (ref 0.0–0.7)
Eosinophils Relative: 3.4 % (ref 0.0–5.0)
HCT: 31.4 % — ABNORMAL LOW (ref 36.0–46.0)
Hemoglobin: 10.2 g/dL — ABNORMAL LOW (ref 12.0–15.0)
Lymphocytes Relative: 39.8 % (ref 12.0–46.0)
Lymphs Abs: 1.8 10*3/uL (ref 0.7–4.0)
MCHC: 32.5 g/dL (ref 30.0–36.0)
MCV: 84.6 fl (ref 78.0–100.0)
Monocytes Absolute: 0.6 10*3/uL (ref 0.1–1.0)
Monocytes Relative: 13.3 % — ABNORMAL HIGH (ref 3.0–12.0)
Neutro Abs: 1.9 10*3/uL (ref 1.4–7.7)
Neutrophils Relative %: 42.2 % — ABNORMAL LOW (ref 43.0–77.0)
Platelets: 278 10*3/uL (ref 150.0–400.0)
RBC: 3.71 Mil/uL — ABNORMAL LOW (ref 3.87–5.11)
RDW: 15.8 % — ABNORMAL HIGH (ref 11.5–15.5)
WBC: 4.5 10*3/uL (ref 4.0–10.5)

## 2020-09-21 LAB — PROTIME-INR
INR: 2.9 ratio — ABNORMAL HIGH (ref 0.8–1.0)
Prothrombin Time: 31.9 s — ABNORMAL HIGH (ref 9.6–13.1)

## 2020-09-26 ENCOUNTER — Encounter: Payer: Self-pay | Admitting: Oncology

## 2020-09-26 ENCOUNTER — Inpatient Hospital Stay: Payer: Medicare Other | Attending: Oncology | Admitting: Oncology

## 2020-09-26 ENCOUNTER — Inpatient Hospital Stay: Payer: Medicare Other

## 2020-09-26 VITALS — BP 115/72 | HR 76 | Temp 97.4°F | Resp 16 | Wt 132.0 lb

## 2020-09-26 DIAGNOSIS — R5383 Other fatigue: Secondary | ICD-10-CM | POA: Insufficient documentation

## 2020-09-26 DIAGNOSIS — Z79899 Other long term (current) drug therapy: Secondary | ICD-10-CM | POA: Diagnosis not present

## 2020-09-26 DIAGNOSIS — Z7982 Long term (current) use of aspirin: Secondary | ICD-10-CM | POA: Diagnosis not present

## 2020-09-26 DIAGNOSIS — R531 Weakness: Secondary | ICD-10-CM | POA: Insufficient documentation

## 2020-09-26 DIAGNOSIS — D509 Iron deficiency anemia, unspecified: Secondary | ICD-10-CM | POA: Diagnosis not present

## 2020-09-26 NOTE — Progress Notes (Signed)
Patient here for initial visit, she reports dyspnea with exertion. She has an complicated cardiac history.

## 2020-09-27 NOTE — Progress Notes (Signed)
[facility]  [phone] [phone]) [phone]  ID: Stephana Morell Carneal OB: [birth year]  MR#: [id number]  TDH#:[id number]  Patient Care Team: Ria Bush, MD as PCP - General (Family Medicine) [name], MD as PCP - Cardiology (Cardiology) [name] [name], MD as Referring Physician (Ophthalmology) [name], MD as Consulting Physician (Obstetrics and Gynecology) [name], MD as Consulting Physician (Cardiology) [name], MD as Consulting Physician (Dermatology)  CHIEF COMPLAINT: Iron deficiency anemia.  INTERVAL HISTORY: Patient is a 80 year old female who was noted to have a declining hemoglobin and iron stores.  She had a colonoscopy in June 2021 that was unrevealing.  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  She has no neurologic complaints.  She denies any recent fevers or illnesses.  She has a good appetite and denies weight loss.  She has no chest pain, shortness of breath, cough, or hemoptysis.  She denies any nausea, vomiting, constipation, or diarrhea.  She has no urinary complaints.  Patient feels at her baseline offers no further specific complaints today.  REVIEW OF SYSTEMS:   Review of Systems  Constitutional: Negative.  Negative for fever, malaise/fatigue and weight loss.  Respiratory: Negative.  Negative for cough, hemoptysis and shortness of breath.   Cardiovascular: Negative.  Negative for chest pain and leg swelling.  Gastrointestinal: Negative.  Negative for abdominal pain.  Genitourinary: Negative.  Negative for dysuria.  Musculoskeletal: Negative.  Negative for back pain.  Skin: Negative.  Negative for rash.  Neurological: Negative.  Negative for dizziness, weakness and headaches.  Psychiatric/Behavioral: Negative.  The patient is not nervous/anxious.     As per HPI. Otherwise, a complete review of systems is negative.  PAST MEDICAL HISTORY: Past  Medical History:  Diagnosis Date  . Bleeding of eye   . Chronic systolic heart failure ([place])    a. 11/2016 Echo: EF 35-40%, mild MR w/ nl fxning bioprosthesis, mod dil LA.  . CKD (chronic kidney disease) stage 3, GFR 30-59 ml/min (HCC)    baseline Cr 1.5-2; prior saw nephrologist thought hypertensive nephropathy/renovascular disease  . CVA (cerebral infarction)    a. 2014 occipital lobe - some vision loss when coumadin held and not bridged  . Diverticulitis 06/2019  . Dyslipidemia   . Glaucoma   . H. pylori infection    treated  . H/O mitral valve replacement    a. 05/2012 s/p 60mm Carpentier Edwards pericardial tissue valve.  [name] Hx of rheumatic fever   . Hypertension   . Melanoma ([place]) 2006   L shoulder  . NICM (nonischemic cardiomyopathy) ([place])    a. 08/2013 s/p Biotronik Ilesto 7 HF1 BiV ICD (ser# [id number]);  b. 11/2016 Echo: EF 35-40%.  . Non-obstructive CAD    a. s/p MI 2001;  b. 12/2016 Cath: LM 20ost, OM2 60, RCA 30p/d, EF 35-45%. Nl R heart filling pressures.  . Permanent atrial fibrillation ([place])    a. 04/2012 s/p AVN & PPM placement (later upgraded to BiV ICD).  [name] Phlebitis   . Stroke ([employer])   . Thromboembolism of upper extremity artery ([place])    a. 06/2012 h/o acute ischemia due to thromboembolism radial and ulnar arteries when coumadin held s/p atherectomy - needs bridging if off coumadin  . Tricuspid regurgitation    a. 05/2012 s/p TV annuloplasty @ time of MVR.    PAST SURGICAL HISTORY: Past Surgical History:  Procedure Laterality Date  . ABDOMINAL HYSTERECTOMY    .  ABLATION  2013  . ATHERECTOMY  06/2012   left arm after coumadin held without bridging  . BREAST BIOPSY    . CARDIOVASCULAR STRESS TEST  03/2016   WNL  . CATARACT EXTRACTION Bilateral 2007, 2010  . CHOLECYSTECTOMY    . COLONOSCOPY  03/2014   diverticulosis, int hem, rpt 5 yrs for h/o polyps ([place])  . COLONOSCOPY WITH PROPOFOL N/A 11/22/2019   diverticulosis, int hem, rpt 5 yrs [name], [name], MD)  .  defibrillator     MEDTRONIC 5086 MRI 52 CM LEAD, SERIAL # [id number]  . DEXA  07/2016   WNL  . DG ABDOMEN COMPLETE ([place] HX)  02/2013   HH, mod severe erosive gastritis, duodenitis  . ESOPHAGOGASTRODUODENOSCOPY (EGD) WITH PROPOFOL N/A 11/22/2019   mild reactive gastritis, biopsy neg for H pylori [name], [name], MD)  . GALLBLADDER SURGERY  2006  . INSERT / REPLACE / REMOVE PACEMAKER  08/2013  . MITRAL VALVE REPLACEMENT  05/2012  . RIGHT/LEFT HEART CATH AND CORONARY ANGIOGRAPHY N/A 12/29/2016   Procedure: Right/Left Heart Cath and Coronary Angiography;  Surgeon: [name], MD;  Location: [facility];  Service: Cardiovascular;  Laterality: N/A;  . TOTAL ABDOMINAL HYSTERECTOMY W/ BILATERAL SALPINGOOPHORECTOMY  1991   irregular bleeding  . TRICUSPID VALVE REPLACEMENT  05/2012  . US ECHOCARDIOGRAPHY  02/2014   EF 40%, LA marked dilation, gen LV hypokinesis, mitral prosthetic valve    FAMILY HISTORY: Family History  Problem Relation Age of Onset  . CAD Father 41       MI  . Hypertension Brother   . Heart failure Brother        CHF with pacemaker  . Stroke Brother   . Heart failure Sister        CHF  . Stroke Sister   . Diabetes Cousin   . Cancer Maternal Aunt        colon  . Cancer Cousin        breast  . Cancer Cousin        brain tumor  . Basal cell carcinoma Daughter     ADVANCED DIRECTIVES (Y/N):  N  HEALTH MAINTENANCE: Social History   Tobacco Use  . Smoking status: Former Smoker    Packs/day: 0.50    Years: 20.00    Pack years: 10.00    Types: Cigarettes    Quit date: 02/04/1990    Years since quitting: 30.6  . Smokeless tobacco: Never Used  Vaping Use  . Vaping Use: Never used  Substance Use Topics  . Alcohol use: Yes    Alcohol/week: 0.0 standard drinks    Comment: occasional  . Drug use: No     Colonoscopy:  PAP:  Bone density:  Lipid panel:  Allergies  Allergen Reactions  . Phytonadione Other (See Comments)  . Amiodarone Other (See  Comments)    "Irritable"--IV INFUSION   . Ciprofloxacin Rash    Rash while on biaxin, cipro, flagyl (unsure which was inciting agent)  . Clarithromycin Rash    Rash while on biaxin, cipro, flagyl (unsure which was inciting agent)  . Lisinopril Rash  . Metronidazole Rash    Rash while on biaxin, cipro, flagyl (unsure which was inciting agent)  . Phytonadione [Vitamin K1] Other (See Comments)    "extremely faint"--IV INFUSION  . Vitamin K2 Anxiety    Current Outpatient Medications  Medication Sig Dispense Refill  . acetaminophen (TYLENOL) 500 MG tablet Take 500-1,000 mg by mouth every 6 (six)  hours as needed (for headaches/pain.).    [name] amitriptyline (ELAVIL) 10 MG tablet Take 2 tablets (20 mg total) by mouth at bedtime. 180 tablet 1  . aspirin EC 81 MG tablet Take 81 mg by mouth daily.    . B Complex Vitamins (VITAMIN B COMPLEX PO) Take by mouth daily.    . bumetanide (BUMEX) 0.5 MG tablet Take 1 tablet (0.5 mg total) by mouth every other day. 90 tablet 0  . carvedilol (COREG) 12.5 MG tablet Take 1 tablet (12.5 mg total) by mouth 2 (two) times daily with a meal. 180 tablet 2  . Cetirizine HCl (ZYRTEC PO) Take by mouth as needed.    . Cholecalciferol (VITAMIN D) 50 MCG (2000 UT) CAPS Take 1 capsule (2,000 Units total) by mouth daily. 30 capsule   . LUMIGAN 0.01 % SOLN Place 1 drop into both eyes at bedtime.     . Magnesium 250 MG TABS Take 250 mg by mouth daily.    . meclizine (ANTIVERT) 25 MG tablet TAKE 1 TABLET TWICE A DAY  AS NEEDED FOR DIZZINESS 180 tablet 1  . nitrofurantoin (MACRODANTIN) 100 MG capsule Take 1 capsule (100 mg total) by mouth daily. 30 capsule 11  . Polyethyl Glycol-Propyl Glycol 0.4-0.3 % SOLN Place 1 drop into both eyes 3 (three) times daily as needed (for dry eyes.).    [name] sacubitril-valsartan (ENTRESTO) 49-51 MG Take 1 tablet (49/51 mg) by mouth twice daily 180 tablet 3  . spironolactone (ALDACTONE) 25 MG tablet TAKE 1/2 TABLET (12.5MG  TOTAL) DAILY 45 tablet 2  .  vitamin E 400 UNIT capsule Take 400 Units by mouth daily.    [name] warfarin (JANTOVEN) 3 MG tablet TAKE 1 TO 2 TABLETS DAILY  AS DIRECTED BY THE COUMADINCLINIC 120 tablet 1   No current facility-administered medications for this visit.    OBJECTIVE: Vitals:   09/26/20 1001  BP: 115/72  Pulse: 76  Resp: 16  Temp: (!) 97.4 F (36.3 C)     Body mass index is 24.14 kg/m.    ECOG FS:0 - Asymptomatic  General: Well-developed, well-nourished, no acute distress. Eyes: Pink conjunctiva, anicteric sclera. HEENT: Normocephalic, moist mucous membranes. Lungs: No audible wheezing or coughing. Heart: Regular rate and rhythm. Abdomen: Soft, nontender, no obvious distention. Musculoskeletal: No edema, cyanosis, or clubbing. Neuro: Alert, answering all questions appropriately. Cranial nerves grossly intact. Skin: No rashes or petechiae noted. Psych: Normal affect. Lymphatics: No cervical, calvicular, axillary or inguinal LAD.   LAB RESULTS:  Lab Results  Component Value Date   NA 136 09/07/2020   K 4.5 09/07/2020   CL 104 09/07/2020   CO2 25 09/07/2020   GLUCOSE 99 09/07/2020   BUN 28 (H) 09/07/2020   CREATININE 1.05 09/07/2020   CALCIUM 10.0 09/07/2020   PROT 7.1 09/07/2020   ALBUMIN 4.2 09/07/2020   AST 17 09/07/2020   ALT 10 09/07/2020   ALKPHOS 71 09/07/2020   BILITOT 0.5 09/07/2020   GFRNONAA 51 (L) 04/12/2020   GFRAA 56 (L) 03/31/2019    Lab Results  Component Value Date   WBC 4.5 09/21/2020   NEUTROABS 1.9 09/21/2020   HGB 10.2 (L) 09/21/2020   HCT 31.4 (L) 09/21/2020   MCV 84.6 09/21/2020   PLT 278.0 09/21/2020   Lab Results  Component Value Date   IRON 34 (L) 09/14/2020   TIBC 423 09/14/2020   IRONPCTSAT 8 (L) 09/14/2020   Lab Results  Component Value Date   FERRITIN 14 (L) 09/14/2020  STUDIES: No results found.  ASSESSMENT: Iron deficiency anemia.  PLAN:    1.  Iron deficiency anemia: Patient's most recent hemoglobin and iron stores have trended  down and she is mildly symptomatic.  Colonoscopy in June 2021 did not reveal any significant pathology.  Return to clinic in 1 anteriorward he received 510 mg IV Feraheme.  Patient is moving back to [place] on December 21, 2020, therefore she will return to clinic at the end of June for repeat laboratory work, further evaluation, and continuation of treatment.  I spent a total of 45 minutes reviewing chart data, face-to-face evaluation with the patient, counseling and coordination of care as detailed above.   Patient expressed understanding and was in agreement with this plan. She also understands that She can call clinic at any time with any questions, concerns, or complaints.    Lloyd Huger, MD   09/27/2020 7:01 AM

## 2020-10-03 ENCOUNTER — Inpatient Hospital Stay: Payer: Medicare Other

## 2020-10-03 VITALS — BP 122/81 | HR 75 | Temp 96.9°F | Resp 20

## 2020-10-03 DIAGNOSIS — D509 Iron deficiency anemia, unspecified: Secondary | ICD-10-CM | POA: Diagnosis not present

## 2020-10-03 DIAGNOSIS — R531 Weakness: Secondary | ICD-10-CM | POA: Diagnosis not present

## 2020-10-03 DIAGNOSIS — Z7982 Long term (current) use of aspirin: Secondary | ICD-10-CM | POA: Diagnosis not present

## 2020-10-03 DIAGNOSIS — Z79899 Other long term (current) drug therapy: Secondary | ICD-10-CM | POA: Diagnosis not present

## 2020-10-03 DIAGNOSIS — R5383 Other fatigue: Secondary | ICD-10-CM | POA: Diagnosis not present

## 2020-10-03 MED ORDER — SODIUM CHLORIDE 0.9 % IV SOLN
Freq: Once | INTRAVENOUS | Status: AC
Start: 2020-10-03 — End: 2020-10-03
  Filled 2020-10-03: qty 250

## 2020-10-03 MED ORDER — SODIUM CHLORIDE 0.9 % IV SOLN
510.0000 mg | Freq: Once | INTRAVENOUS | Status: AC
  Administered 2020-10-03: 510 mg via INTRAVENOUS
  Filled 2020-10-03: qty 510

## 2020-10-09 ENCOUNTER — Ambulatory Visit (INDEPENDENT_AMBULATORY_CARE_PROVIDER_SITE_OTHER): Payer: Medicare Other | Admitting: Cardiovascular Disease

## 2020-10-09 ENCOUNTER — Other Ambulatory Visit: Payer: Self-pay

## 2020-10-09 ENCOUNTER — Encounter: Payer: Self-pay | Admitting: Cardiovascular Disease

## 2020-10-09 ENCOUNTER — Inpatient Hospital Stay: Payer: Medicare Other

## 2020-10-09 VITALS — BP 131/72 | HR 75 | Temp 97.1°F | Resp 16

## 2020-10-09 VITALS — BP 144/76 | HR 75 | Ht 62.0 in | Wt 137.1 lb

## 2020-10-09 DIAGNOSIS — I10 Essential (primary) hypertension: Secondary | ICD-10-CM

## 2020-10-09 DIAGNOSIS — I5022 Chronic systolic (congestive) heart failure: Secondary | ICD-10-CM | POA: Diagnosis not present

## 2020-10-09 DIAGNOSIS — I482 Chronic atrial fibrillation, unspecified: Secondary | ICD-10-CM | POA: Diagnosis not present

## 2020-10-09 DIAGNOSIS — R531 Weakness: Secondary | ICD-10-CM | POA: Diagnosis not present

## 2020-10-09 DIAGNOSIS — Z953 Presence of xenogenic heart valve: Secondary | ICD-10-CM | POA: Diagnosis not present

## 2020-10-09 DIAGNOSIS — Z79899 Other long term (current) drug therapy: Secondary | ICD-10-CM | POA: Diagnosis not present

## 2020-10-09 DIAGNOSIS — Z7982 Long term (current) use of aspirin: Secondary | ICD-10-CM | POA: Diagnosis not present

## 2020-10-09 DIAGNOSIS — R5383 Other fatigue: Secondary | ICD-10-CM | POA: Diagnosis not present

## 2020-10-09 DIAGNOSIS — D509 Iron deficiency anemia, unspecified: Secondary | ICD-10-CM | POA: Diagnosis not present

## 2020-10-09 MED ORDER — SODIUM CHLORIDE 0.9 % IV SOLN
510.0000 mg | Freq: Once | INTRAVENOUS | Status: AC
  Administered 2020-10-09: 510 mg via INTRAVENOUS
  Filled 2020-10-09: qty 510

## 2020-10-09 MED ORDER — SODIUM CHLORIDE 0.9 % IV SOLN
Freq: Once | INTRAVENOUS | Status: AC
  Filled 2020-10-09: qty 250

## 2020-10-09 NOTE — Patient Instructions (Signed)
Medication Instructions:  Your physician recommends that you continue on your current medications as directed. Please refer to the Current Medication list given to you today.  *If you need a refill on your cardiac medications before your next appointment, please call your pharmacy*   Lab Work: None ordered If you have labs (blood work) drawn today and your tests are completely normal, you will receive your results only by: . MyChart Message (if you have MyChart) OR . A paper copy in the mail If you have any lab test that is abnormal or we need to change your treatment, we will call you to review the results.   Testing/Procedures: None ordered   Follow-Up: At [facility], you and your health needs are our priority.  As part of our continuing mission to provide you with exceptional heart care, we have created designated Provider Care Teams.  These Care Teams include your primary Cardiologist (physician) and Advanced Practice Providers (APPs -  Physician Assistants and Nurse Practitioners) who all work together to provide you with the care you need, when you need it.  We recommend signing up for the patient portal called "MyChart".  Sign up information is provided on this After Visit Summary.  MyChart is used to connect with patients for Virtual Visits (Telemedicine).  Patients are able to view lab/test results, encounter notes, upcoming appointments, etc.  Non-urgent messages can be sent to your provider as well.   To learn more about what you can do with MyChart, go to [email or web address].    Your next appointment:   As needed   The format for your next appointment:   In Person  Provider:   You may see Muhammad Arida, MD or one of the following Advanced Practice Providers on your designated Care Team:    Christopher Berge, NP  Ryan Dunn, PA-C  Jacquelyn Visser, PA-C  Cadence Furth, PA-C  Caitlin Walker, NP    Other Instructions N/A  

## 2020-10-09 NOTE — Progress Notes (Signed)
Cardiology Office Note   Date:  10/09/2020   ID:  Elizabeth Hall, DOB Mar 20, 1941, MRN [id number]  PCP:  Ria Bush, MD  Cardiologist:   Kathlyn Sacramento, MD   Chief Complaint  Patient presents with  . Other    LS 12/21 no complaints today. Meds reviewed verbally with pt.      History of Present Illness: Elizabeth Hall is a 80 y.o. female who presents for a follow-up visit. 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 Congestive heart failure was diagnosed in 2007.  She has known history of atrial fibrillation. She underwent AV nodal ablation and pacemaker placement in November 2013. 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 Her ejection fraction deteriorated and she underwent explantation of the previous pacemaker and implantation of biventricular ICD in March 2015. She had CVA in the past when her warfarin was held for a procedure. Since then, she has been bridged with low molecular weight heparin.  ABI was normal in 02/2016.  She had worsening symptoms of heart failure in 2018.  Echocardiogram at that time showed an EF of 35 to 40% with normal functioning mitral valve prosthesis with a mean gradient of 4 mmHg and moderately dilated left atrium.  A right and left cardiac catheterization in July of 2018 showed mild nonobstructive coronary artery disease.  Ejection fraction was 40%.  Right heart catheterization showed normal LVEDP, normal cardiac output and no significant pulmonary hypertension.  There was no evidence of left-to-right intra-atrial shunt.  There was mildly increased gradient across the mitral valve prosthesis with a mean gradient of 8 mmHg with a valve area of 1.48.  Most recent echocardiogram in November of last year showed an improvement in ejection fraction to 45 to  50%, normal pulmonary pressure and normal bioprosthetic mitral valve.  She has been doing well with no recent chest pain or worsening dyspnea.  No significant leg edema.  She will be moving back to [facility] in July due to relocation of her son.   Past Medical History:  Diagnosis Date  . Bleeding of eye   . Chronic systolic heart failure ([place])    a. 11/2016 Echo: EF 35-40%, mild MR w/ nl fxning bioprosthesis, mod dil LA.  . CKD (chronic kidney disease) stage 3, GFR 30-59 ml/min (HCC)    baseline Cr 1.5-2; prior saw nephrologist thought hypertensive nephropathy/renovascular disease  . CVA (cerebral infarction)    a. 2014 occipital lobe - some vision loss when coumadin held and not bridged  . Diverticulitis 06/2019  . Dyslipidemia   . Glaucoma   . H. pylori infection    treated  . H/O mitral valve replacement    a. 05/2012 s/p 72mm Carpentier Edwards pericardial tissue valve.  [name] Hx of rheumatic fever   . Hypertension   . Melanoma ([place]) 2006   L shoulder  . NICM (nonischemic cardiomyopathy) ([place])    a. 08/2013 s/p Biotronik Ilesto 7 HF1 BiV ICD (ser# [id number]);  b. 11/2016 Echo: EF 35-40%.  . Non-obstructive CAD    a. s/p MI 2001;  b. 12/2016 Cath: LM 20ost, OM2 60, RCA 30p/d, EF 35-45%. Nl R heart filling pressures.  . Permanent atrial fibrillation ([place])    a. 04/2012 s/p AVN & PPM placement (later upgraded to BiV ICD).  [name] Phlebitis   . Stroke (  [place])   . Thromboembolism of upper extremity artery ([place])    a. 06/2012 h/o acute ischemia due to thromboembolism radial and ulnar arteries when coumadin held s/p atherectomy - needs bridging if off coumadin  . Tricuspid regurgitation    a. 05/2012 s/p TV annuloplasty @ time of MVR.    Past Surgical History:  Procedure Laterality Date  . ABDOMINAL HYSTERECTOMY    . ABLATION  2013  . ATHERECTOMY  06/2012   left arm after coumadin held without bridging  . BREAST BIOPSY    . CARDIOVASCULAR STRESS TEST  03/2016   WNL  . CATARACT  EXTRACTION Bilateral 2007, 2010  . CHOLECYSTECTOMY    . COLONOSCOPY  03/2014   diverticulosis, int hem, rpt 5 yrs for h/o polyps ([place])  . COLONOSCOPY WITH PROPOFOL N/A 11/22/2019   diverticulosis, int hem, rpt 5 yrs [name], [name], MD)  . defibrillator     MEDTRONIC 5086 MRI 52 CM LEAD, SERIAL # [id number]  . DEXA  07/2016   WNL  . DG ABDOMEN COMPLETE ([place] HX)  02/2013   HH, mod severe erosive gastritis, duodenitis  . ESOPHAGOGASTRODUODENOSCOPY (EGD) WITH PROPOFOL N/A 11/22/2019   mild reactive gastritis, biopsy neg for H pylori [name], [name], MD)  . GALLBLADDER SURGERY  2006  . INSERT / REPLACE / REMOVE PACEMAKER  08/2013  . MITRAL VALVE REPLACEMENT  05/2012  . RIGHT/LEFT HEART CATH AND CORONARY ANGIOGRAPHY N/A 12/29/2016   Procedure: Right/Left Heart Cath and Coronary Angiography;  Surgeon: [name], MD;  Location: [facility];  Service: Cardiovascular;  Laterality: N/A;  . TOTAL ABDOMINAL HYSTERECTOMY W/ BILATERAL SALPINGOOPHORECTOMY  1991   irregular bleeding  . TRICUSPID VALVE REPLACEMENT  05/2012  . US ECHOCARDIOGRAPHY  02/2014   EF 40%, LA marked dilation, gen LV hypokinesis, mitral prosthetic valve     Current Outpatient Medications  Medication Sig Dispense Refill  . acetaminophen (TYLENOL) 500 MG tablet Take 500-1,000 mg by mouth every 6 (six) hours as needed (for headaches/pain.).    [name] amitriptyline (ELAVIL) 10 MG tablet Take 2 tablets (20 mg total) by mouth at bedtime. 180 tablet 1  . aspirin EC 81 MG tablet Take 81 mg by mouth daily.    . B Complex Vitamins (VITAMIN B COMPLEX PO) Take by mouth daily.    . bumetanide (BUMEX) 0.5 MG tablet Take 1 tablet (0.5 mg total) by mouth every other day. 90 tablet 0  . carvedilol (COREG) 12.5 MG tablet Take 1 tablet (12.5 mg total) by mouth 2 (two) times daily with a meal. 180 tablet 2  . Cetirizine HCl (ZYRTEC PO) Take by mouth as needed.    . Cholecalciferol (VITAMIN D) 50 MCG (2000 UT) CAPS Take 1 capsule (2,000 Units  total) by mouth daily. 30 capsule   . LUMIGAN 0.01 % SOLN Place 1 drop into both eyes at bedtime.     . Magnesium 250 MG TABS Take 250 mg by mouth daily.    . meclizine (ANTIVERT) 25 MG tablet TAKE 1 TABLET TWICE A DAY  AS NEEDED FOR DIZZINESS 180 tablet 1  . nitrofurantoin (MACRODANTIN) 100 MG capsule Take 1 capsule (100 mg total) by mouth daily. 30 capsule 11  . Polyethyl Glycol-Propyl Glycol 0.4-0.3 % SOLN Place 1 drop into both eyes 3 (three) times daily as needed (for dry eyes.).    [name] sacubitril-valsartan (ENTRESTO) 49-51 MG Take 1 tablet (49/51 mg) by mouth twice daily 180 tablet 3  . spironolactone (ALDACTONE) 25 MG  tablet TAKE 1/2 TABLET (12.5MG  TOTAL) DAILY 45 tablet 2  . vitamin E 400 UNIT capsule Take 400 Units by mouth daily.    [name] warfarin (JANTOVEN) 3 MG tablet TAKE 1 TO 2 TABLETS DAILY  AS DIRECTED BY THE COUMADINCLINIC 120 tablet 1   No current facility-administered medications for this visit.    Allergies:   Phytonadione, Amiodarone, Ciprofloxacin, Clarithromycin, Lisinopril, Metronidazole, Phytonadione [vitamin k1], and Vitamin k2    Social History:  The patient  reports that she quit smoking about 30 years ago. Her smoking use included cigarettes. She has a 10.00 pack-year smoking history. She has never used smokeless tobacco. She reports current alcohol use. She reports that she does not use drugs.   Family History:  The patient's family history includes Basal cell carcinoma in her daughter; CAD (age of onset: 31) in her father; Cancer in her cousin, cousin, and maternal aunt; Diabetes in her cousin; Heart failure in her brother and sister; Hypertension in her brother; Stroke in her brother and sister.    ROS:  Please see the history of present illness.   Otherwise, review of systems are positive for none.   All other systems are reviewed and negative.    PHYSICAL EXAM: VS:  BP (!) 144/76 (BP Location: Left Arm, Patient Position: Sitting, Cuff Size: Normal)   Pulse 75    Ht 5\' 2"  (1.575 m)   Wt 137 lb 2 oz (62.2 kg)   LMP  (LMP Unknown) Comment: menopause age 13  SpO2 97%   BMI 25.08 kg/m  , BMI Body mass index is 25.08 kg/m. GEN: Well nourished, well developed, in no acute distress  HEENT: normal  Neck: no JVD, carotid bruits, or masses Cardiac: RRR; no murmurs, rubs, or gallops,no edema  Respiratory:  clear to auscultation bilaterally, normal work of breathing GI: soft, nontender, nondistended, + BS MS: no deformity or atrophy  Skin: warm and dry, no rash Neuro:  Strength and sensation are intact Psych: euthymic mood, full affect   EKG:  EKG ordered today. EKG showed atrial fibrillation with biventricular pacing.  Heart rate is 75 bpm.   Recent Labs: 09/07/2020: ALT 10; BUN 28; Creatinine, Ser 1.05; Potassium 4.5; Sodium 136 09/21/2020: Hemoglobin 10.2; Platelets 278.0    Lipid Panel    Component Value Date/Time   CHOL 137 09/07/2020 0744   CHOL 192 06/12/2014 0000   TRIG 44.0 09/07/2020 0744   TRIG 70 06/12/2014 0000   HDL 61.30 09/07/2020 0744   CHOLHDL 2 09/07/2020 0744   VLDL 8.8 09/07/2020 0744   LDLCALC 67 09/07/2020 0744   LDLCALC 104 06/12/2014 0000      Wt Readings from Last 3 Encounters:  10/09/20 137 lb 2 oz (62.2 kg)  09/26/20 132 lb (59.9 kg)  09/14/20 134 lb 7 oz (61 kg)         ASSESSMENT AND PLAN:  1.  Chronic systolic heart failure: Most recent echocardiogram showed improvement in ejection fraction to 45 to 50%.  She appears to be euvolemic on current dose of Bumex.  Continue carvedilol, Entresto and spironolactone.  Blood pressure is mildly elevated today but has been well controlled recently.  I made no changes today but Delene Loll can be increased in the near future if needed.  In addition, an SGLT2 inhibitor should be considered.  2. Chronic atrial fibrillation: Status post AV nodal ablation. She is on Piccione-term anticoagulation with warfarin.  3. Essential hypertension: Blood pressure is reasonably  controlled.  4. Status post  mitral valve replacement with bioprosthetic valve: Stable function on recent echocardiogram.   Disposition:   Patient will be moving back to [place], [place] in July and will reestablish with her cardiologist there.  Signed,  Kathlyn Sacramento, MD  10/09/2020 1:56 PM    [place]

## 2020-10-17 ENCOUNTER — Ambulatory Visit (INDEPENDENT_AMBULATORY_CARE_PROVIDER_SITE_OTHER): Payer: Medicare Other

## 2020-10-17 ENCOUNTER — Other Ambulatory Visit: Payer: Self-pay

## 2020-10-17 DIAGNOSIS — I4891 Unspecified atrial fibrillation: Secondary | ICD-10-CM | POA: Diagnosis not present

## 2020-10-17 DIAGNOSIS — Z953 Presence of xenogenic heart valve: Secondary | ICD-10-CM

## 2020-10-17 DIAGNOSIS — I4821 Permanent atrial fibrillation: Secondary | ICD-10-CM | POA: Diagnosis not present

## 2020-10-17 DIAGNOSIS — Z5181 Encounter for therapeutic drug level monitoring: Secondary | ICD-10-CM | POA: Diagnosis not present

## 2020-10-17 LAB — POCT INR: INR: 3.1 — AB (ref 2.0–3.0)

## 2020-10-17 NOTE — Patient Instructions (Signed)
-   have a large serving of greens tonight, then  -  continue dosage of warfarin 3 mg every day except 6 mg on Mondays & Fridays - Recheck INR in 3 weeks

## 2020-10-21 ENCOUNTER — Other Ambulatory Visit: Payer: Self-pay | Admitting: Family Medicine

## 2020-10-23 NOTE — Telephone Encounter (Signed)
Refill request Amitriptyline Last refill 06/05/20 #180/1 Last office visit 09/14/20

## 2020-10-24 NOTE — Telephone Encounter (Signed)
ERx 

## 2020-11-05 ENCOUNTER — Telehealth: Payer: Self-pay | Admitting: *Deleted

## 2020-11-05 NOTE — Telephone Encounter (Signed)
Patient called stating that she has talked with Dr. Danise Mina about her moving back to [place]. Patient stated that she has found an apartment and will be moving in July. Patient stated that she is in need of a letter from Dr. Danise Mina. Patient stated that she was told if she can get a letter from her doctor stating that her cat is her comfort animal she will be able to keep her cat at her new apartment.

## 2020-11-06 ENCOUNTER — Ambulatory Visit (INDEPENDENT_AMBULATORY_CARE_PROVIDER_SITE_OTHER): Payer: Medicare Other

## 2020-11-06 DIAGNOSIS — I4821 Permanent atrial fibrillation: Secondary | ICD-10-CM | POA: Diagnosis not present

## 2020-11-06 NOTE — Telephone Encounter (Signed)
Pt would like to pick up letter

## 2020-11-06 NOTE — Telephone Encounter (Signed)
Lvm

## 2020-11-06 NOTE — Telephone Encounter (Signed)
Letter written, printed, and in Lisa's box. Will see what they say.   btw i'm glad she saw Dr Grayland Ormond and got her iron infusions - hopefully she's better symptomatically after this.

## 2020-11-06 NOTE — Telephone Encounter (Signed)
Noted.  Placed letter at front office- yellow folders.

## 2020-11-06 NOTE — Telephone Encounter (Signed)
Lvm asking pt to call back.  Need to know if pt needs letter mailed to address on file or will she pick it up.  [Placed letter in [employer] on [employer].]

## 2020-11-07 ENCOUNTER — Ambulatory Visit (INDEPENDENT_AMBULATORY_CARE_PROVIDER_SITE_OTHER): Payer: Medicare Other

## 2020-11-07 ENCOUNTER — Other Ambulatory Visit: Payer: Self-pay

## 2020-11-07 DIAGNOSIS — I4821 Permanent atrial fibrillation: Secondary | ICD-10-CM | POA: Diagnosis not present

## 2020-11-07 DIAGNOSIS — I4891 Unspecified atrial fibrillation: Secondary | ICD-10-CM | POA: Diagnosis not present

## 2020-11-07 DIAGNOSIS — H401233 Low-tension glaucoma, bilateral, severe stage: Secondary | ICD-10-CM | POA: Diagnosis not present

## 2020-11-07 DIAGNOSIS — Z953 Presence of xenogenic heart valve: Secondary | ICD-10-CM | POA: Diagnosis not present

## 2020-11-07 DIAGNOSIS — Z5181 Encounter for therapeutic drug level monitoring: Secondary | ICD-10-CM

## 2020-11-07 LAB — CUP PACEART REMOTE DEVICE CHECK
Battery Remaining Percentage: 18 %
Battery Voltage: 2.88 V
Brady Statistic RV Percent Paced: 98 %
Date Time Interrogation Session: 20220517163324
HighPow Impedance: 50 Ohm
Implantable Lead Implant Date: 20150316
Implantable Lead Implant Date: 20150316
Implantable Lead Location: 753858
Implantable Lead Location: 753860
Implantable Lead Model: 346
Implantable Lead Serial Number: 25130583
Implantable Pulse Generator Implant Date: 20150316
Lead Channel Impedance Value: 419 Ohm
Lead Channel Impedance Value: 492 Ohm
Lead Channel Pacing Threshold Amplitude: 0.6 V
Lead Channel Pacing Threshold Pulse Width: 0.4 ms
Lead Channel Setting Pacing Amplitude: 2.5 V
Lead Channel Setting Pacing Amplitude: 2.5 V
Lead Channel Setting Pacing Pulse Width: 0.4 ms
Lead Channel Setting Pacing Pulse Width: 0.4 ms
Lead Channel Setting Sensing Sensitivity: 0.8 mV
Lead Channel Setting Sensing Sensitivity: 1.6 mV
Pulse Gen Model: 383547
Pulse Gen Serial Number: 60765179

## 2020-11-07 LAB — POCT INR: INR: 2.7 (ref 2.0–3.0)

## 2020-11-07 NOTE — Patient Instructions (Signed)
-    continue dosage of warfarin 3 mg every day except 6 mg on Mondays & Fridays - Recheck INR in 3 weeks 

## 2020-11-15 DIAGNOSIS — Z124 Encounter for screening for malignant neoplasm of cervix: Secondary | ICD-10-CM | POA: Diagnosis not present

## 2020-11-15 DIAGNOSIS — Z1231 Encounter for screening mammogram for malignant neoplasm of breast: Secondary | ICD-10-CM | POA: Diagnosis not present

## 2020-11-15 DIAGNOSIS — Z6824 Body mass index (BMI) 24.0-24.9, adult: Secondary | ICD-10-CM | POA: Diagnosis not present

## 2020-11-18 ENCOUNTER — Other Ambulatory Visit: Payer: Self-pay | Admitting: Internal Medicine

## 2020-11-20 NOTE — Telephone Encounter (Signed)
This is a [facility] pt 

## 2020-11-20 NOTE — Telephone Encounter (Signed)
Refill request

## 2020-11-28 ENCOUNTER — Other Ambulatory Visit: Payer: Self-pay

## 2020-11-28 ENCOUNTER — Ambulatory Visit (INDEPENDENT_AMBULATORY_CARE_PROVIDER_SITE_OTHER): Payer: Medicare Other

## 2020-11-28 DIAGNOSIS — Z953 Presence of xenogenic heart valve: Secondary | ICD-10-CM | POA: Diagnosis not present

## 2020-11-28 DIAGNOSIS — Z5181 Encounter for therapeutic drug level monitoring: Secondary | ICD-10-CM

## 2020-11-28 DIAGNOSIS — I4891 Unspecified atrial fibrillation: Secondary | ICD-10-CM

## 2020-11-28 DIAGNOSIS — I4821 Permanent atrial fibrillation: Secondary | ICD-10-CM | POA: Diagnosis not present

## 2020-11-28 LAB — POCT INR: INR: 3.6 — AB (ref 2.0–3.0)

## 2020-11-28 MED ORDER — WARFARIN SODIUM 3 MG PO TABS: ORAL_TABLET | ORAL | 1 refills | Status: AC

## 2020-11-28 NOTE — Patient Instructions (Addendum)
-   skip warfarin tonight, then  -  continue dosage of warfarin 3 mg every day except 6 mg on Mondays & Fridays - Recheck INR in 2 weeks

## 2020-11-29 NOTE — Progress Notes (Signed)
Remote ICD transmission.   

## 2020-12-12 ENCOUNTER — Other Ambulatory Visit: Payer: Self-pay | Admitting: *Deleted

## 2020-12-12 ENCOUNTER — Ambulatory Visit (INDEPENDENT_AMBULATORY_CARE_PROVIDER_SITE_OTHER): Payer: Medicare Other

## 2020-12-12 ENCOUNTER — Other Ambulatory Visit: Payer: Self-pay

## 2020-12-12 DIAGNOSIS — Z5181 Encounter for therapeutic drug level monitoring: Secondary | ICD-10-CM

## 2020-12-12 DIAGNOSIS — I4821 Permanent atrial fibrillation: Secondary | ICD-10-CM

## 2020-12-12 DIAGNOSIS — I4891 Unspecified atrial fibrillation: Secondary | ICD-10-CM

## 2020-12-12 DIAGNOSIS — Z953 Presence of xenogenic heart valve: Secondary | ICD-10-CM | POA: Diagnosis not present

## 2020-12-12 DIAGNOSIS — D509 Iron deficiency anemia, unspecified: Secondary | ICD-10-CM

## 2020-12-12 LAB — POCT INR: INR: 3.8 — AB (ref 2.0–3.0)

## 2020-12-12 NOTE — Patient Instructions (Signed)
-   skip warfarin tonight, then  -  START NEW DOSAGE of warfarin 3 mg every day - Recheck INR in 2 weeks

## 2020-12-13 ENCOUNTER — Other Ambulatory Visit: Payer: Self-pay

## 2020-12-13 ENCOUNTER — Inpatient Hospital Stay: Payer: Medicare Other | Attending: Oncology

## 2020-12-13 ENCOUNTER — Inpatient Hospital Stay (HOSPITAL_BASED_OUTPATIENT_CLINIC_OR_DEPARTMENT_OTHER): Payer: Medicare Other | Admitting: Oncology

## 2020-12-13 ENCOUNTER — Encounter: Payer: Self-pay | Admitting: Oncology

## 2020-12-13 ENCOUNTER — Inpatient Hospital Stay: Payer: Medicare Other

## 2020-12-13 VITALS — BP 100/88 | HR 74 | Temp 98.7°F | Resp 17 | Ht 62.0 in | Wt 139.2 lb

## 2020-12-13 DIAGNOSIS — Z85820 Personal history of malignant melanoma of skin: Secondary | ICD-10-CM | POA: Insufficient documentation

## 2020-12-13 DIAGNOSIS — Z79899 Other long term (current) drug therapy: Secondary | ICD-10-CM | POA: Insufficient documentation

## 2020-12-13 DIAGNOSIS — N183 Chronic kidney disease, stage 3 unspecified: Secondary | ICD-10-CM | POA: Insufficient documentation

## 2020-12-13 DIAGNOSIS — I130 Hypertensive heart and chronic kidney disease with heart failure and stage 1 through stage 4 chronic kidney disease, or unspecified chronic kidney disease: Secondary | ICD-10-CM | POA: Insufficient documentation

## 2020-12-13 DIAGNOSIS — I4821 Permanent atrial fibrillation: Secondary | ICD-10-CM | POA: Insufficient documentation

## 2020-12-13 DIAGNOSIS — E785 Hyperlipidemia, unspecified: Secondary | ICD-10-CM | POA: Diagnosis not present

## 2020-12-13 DIAGNOSIS — D509 Iron deficiency anemia, unspecified: Secondary | ICD-10-CM | POA: Diagnosis not present

## 2020-12-13 DIAGNOSIS — I252 Old myocardial infarction: Secondary | ICD-10-CM | POA: Diagnosis not present

## 2020-12-13 DIAGNOSIS — Z7982 Long term (current) use of aspirin: Secondary | ICD-10-CM | POA: Diagnosis not present

## 2020-12-13 DIAGNOSIS — I5022 Chronic systolic (congestive) heart failure: Secondary | ICD-10-CM | POA: Diagnosis not present

## 2020-12-13 DIAGNOSIS — Z7901 Long term (current) use of anticoagulants: Secondary | ICD-10-CM | POA: Insufficient documentation

## 2020-12-13 DIAGNOSIS — Z8673 Personal history of transient ischemic attack (TIA), and cerebral infarction without residual deficits: Secondary | ICD-10-CM | POA: Insufficient documentation

## 2020-12-13 DIAGNOSIS — E611 Iron deficiency: Secondary | ICD-10-CM | POA: Insufficient documentation

## 2020-12-13 LAB — CBC WITH DIFFERENTIAL/PLATELET
Abs Immature Granulocytes: 0.01 10*3/uL (ref 0.00–0.07)
Basophils Absolute: 0 10*3/uL (ref 0.0–0.1)
Basophils Relative: 1 %
Eosinophils Absolute: 0.2 10*3/uL (ref 0.0–0.5)
Eosinophils Relative: 4 %
HCT: 34.8 % — ABNORMAL LOW (ref 36.0–46.0)
Hemoglobin: 11.9 g/dL — ABNORMAL LOW (ref 12.0–15.0)
Immature Granulocytes: 0 %
Lymphocytes Relative: 37 %
Lymphs Abs: 2.1 10*3/uL (ref 0.7–4.0)
MCH: 31 pg (ref 26.0–34.0)
MCHC: 34.2 g/dL (ref 30.0–36.0)
MCV: 90.6 fL (ref 80.0–100.0)
Monocytes Absolute: 0.6 10*3/uL (ref 0.1–1.0)
Monocytes Relative: 10 %
Neutro Abs: 2.8 10*3/uL (ref 1.7–7.7)
Neutrophils Relative %: 48 %
Platelets: 223 10*3/uL (ref 150–400)
RBC: 3.84 MIL/uL — ABNORMAL LOW (ref 3.87–5.11)
RDW: 17.2 % — ABNORMAL HIGH (ref 11.5–15.5)
WBC: 5.8 10*3/uL (ref 4.0–10.5)
nRBC: 0 % (ref 0.0–0.2)

## 2020-12-13 LAB — IRON AND TIBC
Iron: 84 ug/dL (ref 28–170)
Saturation Ratios: 25 % (ref 10.4–31.8)
TIBC: 336 ug/dL (ref 250–450)
UIBC: 252 ug/dL

## 2020-12-13 LAB — FERRITIN: Ferritin: 165 ng/mL (ref 11–307)

## 2020-12-13 NOTE — Progress Notes (Signed)
[name]  [phone] [phone]  ID: Elizabeth Hall OB: [birth year]  MR#: [id number]  PIR#:[id number]  Patient Care Team: Ria Bush, MD as PCP - General (Family Medicine) Elizabeth [place], MD as PCP - Cardiology (Cardiology) [name] [name], MD as Referring Physician (Ophthalmology) [name], MD as Consulting Physician (Obstetrics and Gynecology) [name], MD as Consulting Physician (Cardiology) [name], MD as Consulting Physician (Dermatology)  CHIEF COMPLAINT: Iron deficiency anemia.  INTERVAL HISTORY: Patient returns to clinic today for repeat laboratory work, further evaluation, and consideration of additional IV iron.  She currently feels well and is asymptomatic.  She is in the process of moving to [place].  She denies any weakness or fatigue. She has no neurologic complaints.  She denies any recent fevers or illnesses.  She has a good appetite and denies weight loss.  She has no chest pain, shortness of breath, cough, or hemoptysis.  She denies any nausea, vomiting, constipation, or diarrhea.  She has no urinary complaints.  Patient offers no specific complaints today.  REVIEW OF SYSTEMS:   Review of Systems  Constitutional: Negative.  Negative for fever, malaise/fatigue and weight loss.  Respiratory: Negative.  Negative for cough, hemoptysis and shortness of breath.   Cardiovascular: Negative.  Negative for chest pain and leg swelling.  Gastrointestinal: Negative.  Negative for abdominal pain.  Genitourinary: Negative.  Negative for dysuria.  Musculoskeletal: Negative.  Negative for back pain.  Skin: Negative.  Negative for rash.  Neurological: Negative.  Negative for dizziness, weakness and headaches.  Psychiatric/Behavioral: Negative.  The patient is not nervous/anxious.    As per HPI. Otherwise, a complete review of systems is negative.  PAST MEDICAL HISTORY: Past Medical History:   Diagnosis Date   Bleeding of eye    Chronic systolic heart failure ([place])    a. 11/2016 Echo: EF 35-40%, mild MR w/ nl fxning bioprosthesis, mod dil LA.   CKD (chronic kidney disease) stage 3, GFR 30-59 ml/min (HCC)    baseline Cr 1.5-2; prior saw nephrologist thought hypertensive nephropathy/renovascular disease   CVA (cerebral infarction)    a. 2014 occipital lobe - some vision loss when coumadin held and not bridged   Diverticulitis 06/2019   Dyslipidemia    Glaucoma    H. pylori infection    treated   H/O mitral valve replacement    a. 05/2012 s/p 67mm Carpentier Edwards pericardial tissue valve.   Hx of rheumatic fever    Hypertension    Melanoma ([place]) 2006   L shoulder   NICM (nonischemic cardiomyopathy) ([place])    a. 08/2013 s/p Biotronik Ilesto 7 HF1 BiV ICD (ser# [id number]);  b. 11/2016 Echo: EF 35-40%.   Non-obstructive CAD    a. s/p MI 2001;  b. 12/2016 Cath: LM 20ost, OM2 60, RCA 30p/d, EF 35-45%. Nl R heart filling pressures.   Permanent atrial fibrillation ([place])    a. 04/2012 s/p AVN & PPM placement (later upgraded to BiV ICD).   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 06/2012 h/o acute ischemia due to thromboembolism radial and ulnar arteries when coumadin held s/p atherectomy - needs bridging if off coumadin   Tricuspid regurgitation    a. 05/2012 s/p TV annuloplasty @ time of MVR.    PAST SURGICAL HISTORY: Past Surgical History:  Procedure Laterality Date   ABDOMINAL HYSTERECTOMY     ABLATION  2013   ATHERECTOMY  06/2012   left arm after coumadin held without bridging   BREAST BIOPSY     CARDIOVASCULAR STRESS TEST  03/2016   WNL   CATARACT EXTRACTION Bilateral 2007, 2010   CHOLECYSTECTOMY     COLONOSCOPY  03/2014   diverticulosis, int hem, rpt 5 yrs for h/o polyps ([place])   COLONOSCOPY WITH PROPOFOL N/A 11/22/2019   diverticulosis, int hem, rpt 5 yrs [name], MD)   defibrillator     [employer] [phone] MRI 43 CM LEAD,  SERIAL # [id number]   DEXA  07/2016   WNL   DG ABDOMEN COMPLETE ([place] [facility])  02/2013   HH, mod severe erosive gastritis, duodenitis   ESOPHAGOGASTRODUODENOSCOPY (EGD) WITH PROPOFOL N/A 11/22/2019   mild reactive gastritis, biopsy neg for H pylori Elizabeth [facility], [name], MD)   GALLBLADDER SURGERY  2006   INSERT / REPLACE / REMOVE PACEMAKER  08/2013   MITRAL VALVE REPLACEMENT  05/2012   RIGHT/LEFT HEART CATH AND CORONARY ANGIOGRAPHY N/A 12/29/2016   Procedure: Right/Left Heart Cath and Coronary Angiography;  Surgeon: Elizabeth [place], MD;  Location: [facility];  Service: Cardiovascular;  Laterality: N/A;   TOTAL ABDOMINAL HYSTERECTOMY W/ BILATERAL SALPINGOOPHORECTOMY  1991   irregular bleeding   TRICUSPID VALVE REPLACEMENT  05/2012   US ECHOCARDIOGRAPHY  02/2014   EF 40%, LA marked dilation, gen LV hypokinesis, mitral prosthetic valve    FAMILY HISTORY: Family History  Problem Relation Age of Onset   CAD Father 59       MI   Hypertension Brother    Heart failure Brother        CHF with pacemaker   Stroke Brother    Heart failure Sister        CHF   Stroke Sister    Diabetes Cousin    Cancer Maternal Aunt        colon   Cancer Cousin        breast   Cancer Cousin        brain tumor   Basal cell carcinoma Daughter     ADVANCED DIRECTIVES (Y/N):  N  HEALTH MAINTENANCE: Social History   Tobacco Use   Smoking status: Former    Packs/day: 0.50    Years: 20.00    Pack years: 10.00    Types: Cigarettes    Quit date: 02/04/1990    Years since quitting: 30.8   Smokeless tobacco: Never  Vaping Use   Vaping Use: Never used  Substance Use Topics   Alcohol use: Yes    Alcohol/week: 0.0 standard drinks    Comment: occasional   Drug use: No     Colonoscopy:  PAP:  Bone density:  Lipid panel:  Allergies  Allergen Reactions   Phytonadione Other (See Comments)   Amiodarone Other (See Comments)    "Irritable"--IV INFUSION    Ciprofloxacin Rash    Rash while on biaxin,  cipro, flagyl (unsure which was inciting agent)   Clarithromycin Rash    Rash while on biaxin, cipro, flagyl (unsure which was inciting agent)   Lisinopril Rash   Metronidazole Rash    Rash while on biaxin, cipro, flagyl (unsure which was inciting agent)   Phytonadione [Vitamin K1] Other (See Comments)    "extremely faint"--IV INFUSION   Vitamin K2 Anxiety    Current Outpatient Medications  Medication Sig Dispense Refill   acetaminophen (TYLENOL) 500 MG tablet Take 500-1,000 mg by mouth every 6 (six) hours as needed (for headaches/pain.).  amitriptyline (ELAVIL) 10 MG tablet TAKE 2 TABLETS BY MOUTH AT BEDTIME. 180 tablet 3   aspirin EC 81 MG tablet Take 81 mg by mouth daily.     B Complex Vitamins (VITAMIN B COMPLEX PO) Take by mouth daily.     bumetanide (BUMEX) 0.5 MG tablet Take 1 tablet (0.5 mg total) by mouth every other day. 90 tablet 0   carvedilol (COREG) 12.5 MG tablet Take 1 tablet (12.5 mg total) by mouth 2 (two) times daily with a meal. 180 tablet 2   Cetirizine HCl (ZYRTEC PO) Take by mouth as needed.     Cholecalciferol (VITAMIN D) 50 MCG (2000 UT) CAPS Take 1 capsule (2,000 Units total) by mouth daily. 30 capsule    LUMIGAN 0.01 % SOLN Place 1 drop into both eyes at bedtime.      Magnesium 250 MG TABS Take 250 mg by mouth daily.     meclizine (ANTIVERT) 25 MG tablet TAKE 1 TABLET TWICE A DAY  AS NEEDED FOR DIZZINESS 180 tablet 1   methocarbamol (ROBAXIN) 500 MG tablet Take 500 mg by mouth as needed.     nitrofurantoin (MACRODANTIN) 100 MG capsule Take 1 capsule (100 mg total) by mouth daily. 30 capsule 11   Polyethyl Glycol-Propyl Glycol 0.4-0.3 % SOLN Place 1 drop into both eyes 3 (three) times daily as needed (for dry eyes.).     sacubitril-valsartan (ENTRESTO) 49-51 MG Take 1 tablet (49/51 mg) by mouth twice daily 180 tablet 3   spironolactone (ALDACTONE) 25 MG tablet TAKE 1/2 TABLET (12.5MG  TOTAL) DAILY 45 tablet 2   vitamin E 400 UNIT capsule Take 400 Units by  mouth daily.     warfarin (JANTOVEN) 3 MG tablet TAKE 1 TO 2 TABLETS DAILY  AS DIRECTED BY THE COUMADINCLINIC 120 tablet 1   No current facility-administered medications for this visit.    OBJECTIVE: Vitals:   12/13/20 1331  BP: 100/88  Pulse: 74  Resp: 17  Temp: 98.7 F (37.1 C)  SpO2: 100%     Body mass index is 25.46 kg/m.    ECOG FS:0 - Asymptomatic  General: Well-developed, well-nourished, no acute distress. Eyes: Pink conjunctiva, anicteric sclera. HEENT: Normocephalic, moist mucous membranes. Lungs: No audible wheezing or coughing. Heart: Regular rate and rhythm. Abdomen: Soft, nontender, no obvious distention. Musculoskeletal: No edema, cyanosis, or clubbing. Neuro: Alert, answering all questions appropriately. Cranial nerves grossly intact. Skin: No rashes or petechiae noted. Psych: Normal affect.   LAB RESULTS:  Lab Results  Component Value Date   NA 136 09/07/2020   K 4.5 09/07/2020   CL 104 09/07/2020   CO2 25 09/07/2020   GLUCOSE 99 09/07/2020   BUN 28 (H) 09/07/2020   CREATININE 1.05 09/07/2020   CALCIUM 10.0 09/07/2020   PROT 7.1 09/07/2020   ALBUMIN 4.2 09/07/2020   AST 17 09/07/2020   ALT 10 09/07/2020   ALKPHOS 71 09/07/2020   BILITOT 0.5 09/07/2020   GFRNONAA 51 (L) 04/12/2020   GFRAA 56 (L) 03/31/2019    Lab Results  Component Value Date   WBC 5.8 12/13/2020   NEUTROABS 2.8 12/13/2020   HGB 11.9 (L) 12/13/2020   HCT 34.8 (L) 12/13/2020   MCV 90.6 12/13/2020   PLT 223 12/13/2020   Lab Results  Component Value Date   IRON 84 12/13/2020   TIBC 336 12/13/2020   IRONPCTSAT 25 12/13/2020   Lab Results  Component Value Date   FERRITIN 165 12/13/2020     STUDIES: No results  found.  ASSESSMENT: Iron deficiency anemia.  PLAN:    1.  Iron deficiency anemia: Patient's hemoglobin has significantly improved and now is essentially within normal limits.  Her iron stores are also within normal limits.  Colonoscopy in June 2021 did not  reveal any significant pathology.  She does not require additional IV Feraheme today.  She last received treatment on October 09, 2020.  Patient is moving to [place], therefore no further follow-up has been scheduled.    I spent a total of 20 minutes reviewing chart data, face-to-face evaluation with the patient, counseling and coordination of care as detailed above.   Patient expressed understanding and was in agreement with this plan. She also understands that She can call clinic at any time with any questions, concerns, or complaints.    Lloyd Huger, MD   12/15/2020 2:51 PM

## 2020-12-15 ENCOUNTER — Encounter: Payer: Self-pay | Admitting: Oncology

## 2020-12-26 ENCOUNTER — Other Ambulatory Visit: Payer: Self-pay

## 2020-12-26 ENCOUNTER — Ambulatory Visit (INDEPENDENT_AMBULATORY_CARE_PROVIDER_SITE_OTHER): Payer: Medicare Other | Admitting: Pharmacist

## 2020-12-26 DIAGNOSIS — I4891 Unspecified atrial fibrillation: Secondary | ICD-10-CM | POA: Diagnosis not present

## 2020-12-26 DIAGNOSIS — I4821 Permanent atrial fibrillation: Secondary | ICD-10-CM | POA: Diagnosis not present

## 2020-12-26 DIAGNOSIS — Z5181 Encounter for therapeutic drug level monitoring: Secondary | ICD-10-CM | POA: Diagnosis not present

## 2020-12-26 DIAGNOSIS — Z953 Presence of xenogenic heart valve: Secondary | ICD-10-CM

## 2020-12-26 LAB — POCT INR: INR: 2 (ref 2.0–3.0)

## 2020-12-26 NOTE — Patient Instructions (Signed)
Description   - Take 2 tablets today (6mg )  -  Then continue warfarin 3 mg every day - Recheck INR in 2 weeks

## 2020-12-26 NOTE — Progress Notes (Signed)
Patient moving to [place] next week.  Has appt scheduled with cardiology in September.  Wrote out written Rx for weekly INR checks as needed.  Made copy and will have scanned into chart.  Resolving anticoag episode.

## 2021-01-01 DIAGNOSIS — S8002XA Contusion of left knee, initial encounter: Secondary | ICD-10-CM | POA: Diagnosis not present

## 2021-01-10 ENCOUNTER — Encounter: Payer: Self-pay | Admitting: Cardiovascular Disease

## 2021-01-10 LAB — PROTIME-INR: INR: 2.1 — AB (ref <=1.1)

## 2021-01-11 ENCOUNTER — Ambulatory Visit (INDEPENDENT_AMBULATORY_CARE_PROVIDER_SITE_OTHER): Payer: Medicare Other

## 2021-01-11 ENCOUNTER — Telehealth: Payer: Self-pay | Admitting: Cardiovascular Disease

## 2021-01-11 DIAGNOSIS — I4821 Permanent atrial fibrillation: Secondary | ICD-10-CM

## 2021-01-11 DIAGNOSIS — Z953 Presence of xenogenic heart valve: Secondary | ICD-10-CM | POA: Diagnosis not present

## 2021-01-11 NOTE — Patient Instructions (Signed)
Description   Called spoke with pt, advised to take 2 tablets today ('6mg'$ ), then start taking 1 tablet ('3mg'$  today), except 1.5 tablets (4.'5mg'$ ) on Fridays.  Recheck INR in 2 weeks on Tuesday.  Pt establishing with MD in [place] in September.  Pt was previously established with this MD prior to moving to [place]. Pt has script to check INR weekly as needed.

## 2021-01-11 NOTE — Telephone Encounter (Addendum)
Results will be discussed via Anticoagulation Encounter. Please refer there for details & contacting pt.

## 2021-01-11 NOTE — Telephone Encounter (Signed)
Patient states she has moved to [place], [place] and would like to discuss her coumadin results.

## 2021-01-22 LAB — PROTIME-INR: INR: 2.7 — AB (ref <=1.1)

## 2021-01-24 ENCOUNTER — Encounter: Payer: Self-pay | Admitting: Cardiovascular Disease

## 2021-01-24 ENCOUNTER — Telehealth: Payer: Self-pay | Admitting: Internal Medicine

## 2021-01-24 NOTE — Telephone Encounter (Signed)
Patient c/o Palpitations:  High priority if patient c/o lightheadedness, shortness of breath, or chest pain  How Nair have you had palpitations/irregular HR/ Afib? Are you having the symptoms now? Yes feels pvc's in throat   Are you currently experiencing lightheadedness, SOB or CP? no  Do you have a history of afib (atrial fibrillation) or irregular heart rhythm? Hx pvc's  Have you checked your BP or HR? (document readings if available): not provided   Are you experiencing any other symptoms? Patient recently moved and has been under a lot of stress and is noticing more frequent PVC's

## 2021-01-24 NOTE — Telephone Encounter (Signed)
Patient had pt inr done at [facility]  lab [phone]  Requested fax result for review and dosing per patient request.

## 2021-01-25 ENCOUNTER — Ambulatory Visit (INDEPENDENT_AMBULATORY_CARE_PROVIDER_SITE_OTHER): Payer: Medicare Other

## 2021-01-25 DIAGNOSIS — Z5181 Encounter for therapeutic drug level monitoring: Secondary | ICD-10-CM

## 2021-01-25 DIAGNOSIS — Z953 Presence of xenogenic heart valve: Secondary | ICD-10-CM

## 2021-01-25 DIAGNOSIS — I4821 Permanent atrial fibrillation: Secondary | ICD-10-CM | POA: Diagnosis not present

## 2021-01-25 NOTE — Telephone Encounter (Signed)
I spoke with the patient. She advised that when she called yesterday, Anderson Malta was working on getting a copy of her INR results that the patient had in [place] on 01/22/21.  She advised Anderson Malta called her back and had received these results and was fowarding to CV[facility].  I see the results under the Lab tab dated 01/22/21. There is a note from [place], [place] in CV[facility] from earlier today that the results was addressed on 7/22, but the INR from 8/2 is a scanned report linked to this lab result.  Will forward this message to CV[facility] to address and reach out to the patient. 01/22/21= INR 2.74  The patient also advised that she felt like she was having more PVC's yesterday-- feeling them in her throat, but this feels more settled today. She has been under more stress with the move.  She advised she has an appointment on 03/12/21 to establish with a cardiologist in [place]. I have advised her to call back in the interim if she feels as though her PVC's are getting worse. She confirms taking coreg 12.5 mg BID.  The patient was appreciative for the call back.

## 2021-01-25 NOTE — Telephone Encounter (Signed)
Results received scanned into pt's chart 01/25/21 and addressed, see anticoagulation note in Epic.  Spoke with pt regarding results.

## 2021-01-25 NOTE — Patient Instructions (Addendum)
Description   Called spoke with pt, advised to continue on same dosage 1 tablet ('3mg'$  today), except 1.5 tablets (4.'5mg'$ ) on Fridays.  Recheck INR in 3 weeks on Monday.  Pt establishing with MD in [place] in September.  Pt was previously established with this MD prior to moving to . Pt has script to check INR weekly as needed.

## 2021-01-25 NOTE — Telephone Encounter (Signed)
This encounter was created in error - please disregard.

## 2021-02-05 ENCOUNTER — Ambulatory Visit (INDEPENDENT_AMBULATORY_CARE_PROVIDER_SITE_OTHER): Payer: Medicare Other

## 2021-02-05 DIAGNOSIS — I428 Other cardiomyopathies: Secondary | ICD-10-CM | POA: Diagnosis not present

## 2021-02-05 LAB — CUP PACEART REMOTE DEVICE CHECK
Date Time Interrogation Session: 20220816074154
Implantable Lead Implant Date: 20150316
Implantable Lead Implant Date: 20150316
Implantable Lead Location: 753858
Implantable Lead Location: 753860
Implantable Lead Model: 346
Implantable Lead Serial Number: 25130583
Implantable Pulse Generator Implant Date: 20150316
Pulse Gen Model: 383547
Pulse Gen Serial Number: 60765179

## 2021-02-10 ENCOUNTER — Encounter: Payer: Self-pay | Admitting: Cardiovascular Disease

## 2021-02-11 ENCOUNTER — Telehealth: Payer: Self-pay

## 2021-02-11 ENCOUNTER — Ambulatory Visit (INDEPENDENT_AMBULATORY_CARE_PROVIDER_SITE_OTHER): Payer: Medicare Other | Admitting: Cardiology

## 2021-02-11 DIAGNOSIS — I4821 Permanent atrial fibrillation: Secondary | ICD-10-CM

## 2021-02-11 DIAGNOSIS — Z5181 Encounter for therapeutic drug level monitoring: Secondary | ICD-10-CM

## 2021-02-11 DIAGNOSIS — Z953 Presence of xenogenic heart valve: Secondary | ICD-10-CM

## 2021-02-11 LAB — POCT INR: INR: 2 (ref 2.0–3.0)

## 2021-02-11 NOTE — Telephone Encounter (Signed)
Called and lmom pt to complete [facility] test for inr

## 2021-02-11 NOTE — Patient Instructions (Signed)
Description   Called and spoke with pt, advised to take 1.5 tablets today and then continue 1 tablet ('3mg'$  today), except 1.5 tablets (4.'5mg'$ ) on Fridays.  Recheck INR in 1 week.  Pt establishing with MD in [place] in September.  Pt was previously established with this MD prior to moving to [place]. Pt has script to check INR weekly as needed.

## 2021-02-15 ENCOUNTER — Other Ambulatory Visit: Payer: Self-pay | Admitting: Family

## 2021-02-15 NOTE — Telephone Encounter (Signed)
Rx request sent to pharmacy.  

## 2021-02-18 ENCOUNTER — Encounter: Payer: Self-pay | Admitting: Cardiovascular Disease

## 2021-02-18 ENCOUNTER — Ambulatory Visit (INDEPENDENT_AMBULATORY_CARE_PROVIDER_SITE_OTHER): Payer: Medicare Other

## 2021-02-18 DIAGNOSIS — Z5181 Encounter for therapeutic drug level monitoring: Secondary | ICD-10-CM

## 2021-02-18 DIAGNOSIS — Z953 Presence of xenogenic heart valve: Secondary | ICD-10-CM | POA: Diagnosis not present

## 2021-02-18 DIAGNOSIS — I4821 Permanent atrial fibrillation: Secondary | ICD-10-CM

## 2021-02-18 LAB — POCT INR: INR: 2.5 (ref 2.0–3.0)

## 2021-02-18 NOTE — Patient Instructions (Signed)
Left message for pt advising her to continue 1 tablet ('3mg'$  today), except 1.5 tablets (4.'5mg'$ ) on Fridays.  Recheck INR in 1 week.  Pt establishing with MD in [place] in September.  Pt was previously established with this MD prior to moving to [place]. Pt has script to check INR weekly as needed.

## 2021-02-26 ENCOUNTER — Telehealth: Payer: Self-pay | Admitting: Cardiovascular Disease

## 2021-02-26 NOTE — Progress Notes (Signed)
Remote ICD transmission.   

## 2021-02-26 NOTE — Telephone Encounter (Signed)
Called and spoke to pt. She stated that she was suppose to have her INR checked today, however, she will not be able to go today because she has covid. She stated that she will try to go next Monday (03/04/2021) to have her INR checked. Pt stated she gets her INR check in [place].

## 2021-02-26 NOTE — Telephone Encounter (Signed)
Patient would like to discuss her INR. States she is out of town now and needs to discuss. Patient would not give any more information

## 2021-03-04 ENCOUNTER — Ambulatory Visit (INDEPENDENT_AMBULATORY_CARE_PROVIDER_SITE_OTHER): Payer: Medicare Other

## 2021-03-04 DIAGNOSIS — Z5181 Encounter for therapeutic drug level monitoring: Secondary | ICD-10-CM | POA: Diagnosis not present

## 2021-03-04 DIAGNOSIS — I4821 Permanent atrial fibrillation: Secondary | ICD-10-CM

## 2021-03-04 DIAGNOSIS — Z953 Presence of xenogenic heart valve: Secondary | ICD-10-CM | POA: Diagnosis not present

## 2021-03-04 LAB — POCT INR: INR: 2.9 (ref 2.0–3.0)

## 2021-03-04 NOTE — Patient Instructions (Signed)
Spoke w/ pt & advised her to continue 1 tablet ('3mg'$  today), except 1.5 tablets (4.'5mg'$ ) on Fridays.  Recheck INR in 1 week at her MD appt in [place].   Pt establishing with MD in [place] on September 20.  Pt was previously established with this MD prior to moving to [place]. Pt has script to check INR weekly as needed.

## 2021-03-12 ENCOUNTER — Ambulatory Visit (INDEPENDENT_AMBULATORY_CARE_PROVIDER_SITE_OTHER): Payer: Medicare Other | Admitting: *Deleted

## 2021-03-12 DIAGNOSIS — I4891 Unspecified atrial fibrillation: Secondary | ICD-10-CM

## 2021-03-12 DIAGNOSIS — Z5181 Encounter for therapeutic drug level monitoring: Secondary | ICD-10-CM

## 2021-03-12 LAB — POCT INR: INR: 2.4 (ref 2.0–3.0)

## 2021-03-12 NOTE — Patient Instructions (Signed)
LMOM for pt & advised her to continue 1 tablet (3mg  today), except 1.5 tablets (4.5mg ) on Fridays.  Recheck INR in 1 week.  Pt establishing with MD in [place] on September 20.  Pt was previously established with this MD prior to moving to [place]. Pt has script to check INR weekly as needed.

## 2021-03-13 ENCOUNTER — Telehealth: Payer: Self-pay | Admitting: *Deleted

## 2021-03-13 NOTE — Telephone Encounter (Signed)
Pt is returning your call from yesterday °

## 2021-03-13 NOTE — Telephone Encounter (Signed)
Returned pt call.  Went over coumadin instructions from message yesterday.  Pt verbalized understanding.  She has appt with Cardiologist in [place] 04/30/21.  Had one yesterday but MD was sick.

## 2021-03-19 ENCOUNTER — Other Ambulatory Visit: Payer: Self-pay | Admitting: Cardiovascular Disease

## 2021-03-20 ENCOUNTER — Other Ambulatory Visit: Payer: Self-pay | Admitting: *Deleted

## 2021-03-20 ENCOUNTER — Telehealth: Payer: Self-pay | Admitting: Cardiovascular Disease

## 2021-03-20 MED ORDER — SACUBITRIL-VALSARTAN 49-51 MG PO TABS: ORAL_TABLET | ORAL | 2 refills | Status: AC

## 2021-03-20 MED ORDER — SACUBITRIL-VALSARTAN 49-51 MG PO TABS: ORAL_TABLET | ORAL | 2 refills | Status: DC

## 2021-03-20 NOTE — Telephone Encounter (Signed)
Called and Spoke with patient.  She is going to get INR checked tomorrow in [place] and have results sent to [place].  Still waiting to get reestablished with Cardiologist there.

## 2021-03-20 NOTE — Telephone Encounter (Signed)
If Home Health RN is calling please get Coumadin Nurse on the phone STAT   Patient calling to discuss pt inr check.  Her new cardiology appt in [place] got pushed out until November and she needs advice on when to get next lab draw as she will need to continue to do this at the hospital .   Please call.    Please route to the Coumadin Clinic Pool

## 2021-03-21 ENCOUNTER — Encounter: Payer: Self-pay | Admitting: Cardiovascular Disease

## 2021-03-21 LAB — PROTIME-INR: INR: 3 — AB (ref <=1.1)

## 2021-03-22 ENCOUNTER — Ambulatory Visit (INDEPENDENT_AMBULATORY_CARE_PROVIDER_SITE_OTHER): Payer: Medicare Other | Admitting: Cardiology

## 2021-03-22 DIAGNOSIS — Z5181 Encounter for therapeutic drug level monitoring: Secondary | ICD-10-CM

## 2021-03-22 DIAGNOSIS — Z953 Presence of xenogenic heart valve: Secondary | ICD-10-CM

## 2021-03-22 NOTE — Patient Instructions (Addendum)
Description   Called and spoke to pt and instructed her to continue to take warfarin 1 tablet daily except for 1.5 tablets on Fridays. Recheck INR in 2 weeks on 10/12. Coumadin Clinic [phone].   Pt establishing with MD in [place] on November 8. Pt was previously established with this MD prior to moving to [place]. Pt has script to check INR weekly as needed.

## 2021-03-27 ENCOUNTER — Encounter: Payer: Self-pay | Admitting: Cardiovascular Disease

## 2021-03-27 ENCOUNTER — Other Ambulatory Visit: Payer: Self-pay

## 2021-03-27 DIAGNOSIS — Z5181 Encounter for therapeutic drug level monitoring: Secondary | ICD-10-CM

## 2021-03-27 DIAGNOSIS — I4891 Unspecified atrial fibrillation: Secondary | ICD-10-CM

## 2021-03-27 DIAGNOSIS — Z953 Presence of xenogenic heart valve: Secondary | ICD-10-CM

## 2021-03-27 LAB — POCT INR: INR: 2.7 (ref 2.0–3.0)

## 2021-03-29 ENCOUNTER — Ambulatory Visit (INDEPENDENT_AMBULATORY_CARE_PROVIDER_SITE_OTHER): Payer: Medicare Other

## 2021-03-29 ENCOUNTER — Telehealth: Payer: Self-pay | Admitting: Cardiovascular Disease

## 2021-03-29 DIAGNOSIS — Z7901 Long term (current) use of anticoagulants: Secondary | ICD-10-CM | POA: Diagnosis not present

## 2021-03-29 DIAGNOSIS — Z5181 Encounter for therapeutic drug level monitoring: Secondary | ICD-10-CM | POA: Diagnosis not present

## 2021-03-29 DIAGNOSIS — I4891 Unspecified atrial fibrillation: Secondary | ICD-10-CM

## 2021-03-29 DIAGNOSIS — Z953 Presence of xenogenic heart valve: Secondary | ICD-10-CM

## 2021-03-29 NOTE — Telephone Encounter (Signed)
Returned the patients call. Patient is trying to reach [place], RN with cvrr. Advised the patient that she is not in the office today. I will fwd the message to her to return the call.  Patient voiced appreciation.

## 2021-03-29 NOTE — Progress Notes (Signed)
Patient calling to check on results

## 2021-03-29 NOTE — Patient Instructions (Signed)
Called and spoke to pt and instructed her to continue to take warfarin 1 tablet daily except for 1.5 tablets on Fridays. Recheck INR in 3 weeks on 11/4 or w/ new provider on 11/8. Coumadin Clinic [phone].   Pt establishing with MD in [place] on November 8. Pt was previously established with this MD prior to moving to [place]. Pt has script to check INR weekly as needed.

## 2021-03-29 NOTE — Telephone Encounter (Signed)
Returned call to the pt, she just wanted Mandie to know she did receive her message with INR results and dosage instructions, and she just wanted to say hey and she missed her. Will forward message to [facility] to notify her.

## 2021-03-29 NOTE — Progress Notes (Signed)
Please see anti-coag note for 03/27/21.

## 2021-03-29 NOTE — Telephone Encounter (Signed)
Pt is returning call from earlier today! 

## 2021-04-02 ENCOUNTER — Encounter: Payer: Self-pay | Admitting: Cardiology

## 2021-04-02 NOTE — Progress Notes (Signed)
This encounter was created in error - please disregard.  This encounter was created in error - please disregard.

## 2021-04-06 ENCOUNTER — Other Ambulatory Visit: Payer: Self-pay | Admitting: Cardiovascular Disease

## 2021-05-07 ENCOUNTER — Ambulatory Visit (INDEPENDENT_AMBULATORY_CARE_PROVIDER_SITE_OTHER): Payer: Medicare Other

## 2021-05-07 DIAGNOSIS — I428 Other cardiomyopathies: Secondary | ICD-10-CM

## 2021-05-07 LAB — CUP PACEART REMOTE DEVICE CHECK
Date Time Interrogation Session: 20221115093331
Implantable Lead Implant Date: 20150316
Implantable Lead Implant Date: 20150316
Implantable Lead Location: 753858
Implantable Lead Location: 753860
Implantable Lead Model: 346
Implantable Lead Serial Number: 25130583
Implantable Pulse Generator Implant Date: 20150316
Pulse Gen Model: 383547
Pulse Gen Serial Number: 60765179

## 2021-05-15 NOTE — Progress Notes (Signed)
Remote ICD transmission.   

## 2021-05-17 ENCOUNTER — Other Ambulatory Visit: Payer: Self-pay | Admitting: Cardiovascular Disease

## 2021-05-20 NOTE — Telephone Encounter (Signed)
Pt overdue for an Anticoagulation Check; need to call to see if she has a new provider versus need to have INR checked ASAP.

## 2021-05-20 NOTE — Telephone Encounter (Signed)
Pt has a new provider please see anticoag encounter from 10/7. Called and spoke to pt who stated that she already has a cardiologist Dr. Mariel Kansky who is managing her warfarin.

## 2021-05-20 NOTE — Telephone Encounter (Signed)
Refill request

## 2021-06-15 ENCOUNTER — Other Ambulatory Visit: Payer: Self-pay | Admitting: Cardiovascular Disease

## 2021-07-16 ENCOUNTER — Telehealth: Payer: Self-pay

## 2021-07-16 NOTE — Telephone Encounter (Signed)
Incoming fax from [employer]  Patient has been approved to receive her medication at no cost to her until 06/22/2022

## 2021-07-31 ENCOUNTER — Telehealth: Payer: Self-pay | Admitting: Cardiovascular Disease

## 2021-07-31 ENCOUNTER — Telehealth: Payer: Self-pay

## 2021-07-31 NOTE — Telephone Encounter (Signed)
The patient called letting us know that she now live in a different state and now see a different cardiologist. I cancelled all upcoming home remote checks.

## 2021-07-31 NOTE — Telephone Encounter (Signed)
Patient no longer lives in [place] and has another cardiologist. Please advise

## 2021-07-31 NOTE — Telephone Encounter (Signed)
Left message [phone]

## 2021-10-03 ENCOUNTER — Other Ambulatory Visit: Payer: Self-pay | Admitting: Cardiovascular Disease

## 2021-12-03 ENCOUNTER — Telehealth: Payer: Self-pay | Admitting: Internal Medicine

## 2021-12-03 NOTE — Telephone Encounter (Signed)
Patient moved out of state deleting recall.

## 2021-12-09 NOTE — Telephone Encounter (Signed)
Noted
